# Patient Record
Sex: Female | Born: 1975 | Race: Black or African American | Hispanic: No | Marital: Single | Smoking: Current every day smoker
Health system: Southern US, Community
[De-identification: ages and names within clinical notes are randomized; demographics above are authoritative.]

## PROBLEM LIST (undated history)

## (undated) DIAGNOSIS — Z720 Tobacco use: Secondary | ICD-10-CM

## (undated) DIAGNOSIS — M25561 Pain in right knee: Secondary | ICD-10-CM

## (undated) DIAGNOSIS — R590 Localized enlarged lymph nodes: Secondary | ICD-10-CM

## (undated) DIAGNOSIS — M79641 Pain in right hand: Secondary | ICD-10-CM

## (undated) DIAGNOSIS — L03116 Cellulitis of left lower limb: Secondary | ICD-10-CM

## (undated) DIAGNOSIS — M359 Systemic involvement of connective tissue, unspecified: Secondary | ICD-10-CM

## (undated) DIAGNOSIS — M1990 Unspecified osteoarthritis, unspecified site: Secondary | ICD-10-CM

## (undated) HISTORY — PX: OTHER SURGICAL HISTORY: SHX169

---

## 2015-03-21 DIAGNOSIS — L03115 Cellulitis of right lower limb: Secondary | ICD-10-CM | POA: Diagnosis present

## 2015-03-21 DIAGNOSIS — M79641 Pain in right hand: Secondary | ICD-10-CM | POA: Insufficient documentation

## 2015-03-21 DIAGNOSIS — L03116 Cellulitis of left lower limb: Secondary | ICD-10-CM | POA: Insufficient documentation

## 2015-03-21 DIAGNOSIS — Z720 Tobacco use: Secondary | ICD-10-CM | POA: Insufficient documentation

## 2015-04-02 DIAGNOSIS — M25561 Pain in right knee: Secondary | ICD-10-CM | POA: Insufficient documentation

## 2016-01-09 ENCOUNTER — Encounter: Payer: Self-pay | Admitting: *Deleted

## 2016-01-09 ENCOUNTER — Emergency Department
Admission: EM | Admit: 2016-01-09 | Discharge: 2016-01-09 | Disposition: A | Discharge status: Home / Self Care | Payer: Self-pay | Attending: Emergency Medicine | Admitting: Emergency Medicine

## 2016-01-09 DIAGNOSIS — M19041 Primary osteoarthritis, right hand: Secondary | ICD-10-CM | POA: Insufficient documentation

## 2016-01-09 DIAGNOSIS — F17200 Nicotine dependence, unspecified, uncomplicated: Secondary | ICD-10-CM | POA: Insufficient documentation

## 2016-01-09 DIAGNOSIS — Z888 Allergy status to other drugs, medicaments and biological substances status: Secondary | ICD-10-CM | POA: Insufficient documentation

## 2016-01-09 DIAGNOSIS — M19032 Primary osteoarthritis, left wrist: Secondary | ICD-10-CM | POA: Insufficient documentation

## 2016-01-09 HISTORY — DX: Unspecified osteoarthritis, unspecified site: M19.90

## 2016-01-09 MED ORDER — NAPROXEN 500 MG PO TABS: 500.0000 mg | ORAL_TABLET | Freq: Two times a day (BID) | ORAL | Status: DC

## 2016-01-09 MED ORDER — KETOROLAC TROMETHAMINE 30 MG/ML IJ SOLN
30.0000 mg | Freq: Once | INTRAMUSCULAR | Status: AC
  Administered 2016-01-09: 30 mg via INTRAMUSCULAR
  Filled 2016-01-09: qty 1

## 2016-01-09 NOTE — ED Provider Notes (Signed)
[facility] Emergency Department Provider Note  ____________________________________________  Time seen: Approximately 10:07 AM  I have reviewed the triage vital signs and the nursing notes.   HISTORY  Chief Complaint Joint Pain    HPI Regina Carpenter is a 40 y.o. female, NAD, presents to the emergency department with 2 day history of left wrist pain, right index finger pain and bilateral foot pain. She states that she was diagnosed with osteoarthritis last year. The pain in her hand and wrist began to worsen this week and was disrupting her work. She states that she has tried tylenol and ice for the pain with no relief. Denies any injuries or traumas. Has had no open wounds, skin sores nor rash.   Past Medical History  Diagnosis Date  . Arthritis     There are no active problems to display for this patient.   History reviewed. No pertinent past surgical history.  Current Outpatient Rx  Name  Route  Sig  Dispense  Refill  . naproxen (NAPROSYN) 500 MG tablet   Oral   Take 1 tablet (500 mg total) by mouth 2 (two) times daily with a meal.   30 tablet   0     Allergies Ibuprofen  History reviewed. No pertinent family history.  Social History Social History  Substance Use Topics  . Smoking status: Current Every Day Smoker  . Smokeless tobacco: None  . Alcohol Use: None     Review of Systems Constitutional: No fever/chills, fatigue Eyes: No visual changes. Cardiovascular: No chest pain. Respiratory: No shortness of breath.  Musculoskeletal:Left wrist pain and right index finger pain and swelling. Bilateral generalized foot pain.  Skin: Swelling and redness to left wrist, right index finger and bilateral feet.  Negative for rash. Neurological: Negative for headaches, focal weakness or numbness. 10-point ROS otherwise negative.  ____________________________________________   PHYSICAL EXAM:  VITAL SIGNS: ED Triage Vitals  Enc Vitals  Group     BP 01/09/16 0906 113/78 mmHg     Pulse Rate 01/09/16 0906 99     Resp 01/09/16 0906 18     Temp 01/09/16 0906 98.1 F (36.7 C)     Temp Source 01/09/16 0906 Oral     SpO2 01/09/16 0906 100 %     Weight 01/09/16 0906 140 lb (63.504 kg)     Height 01/09/16 0906 5\' 5"  (1.651 m)     Head Cir --      Peak Flow --      Pain Score 01/09/16 0906 8     Pain Loc --      Pain Edu? --      Excl. in [place]? --      Constitutional: Alert and oriented. Well appearing and in no acute distress. Head: Atraumatic. Hematological/Lymphatic/Immunilogical: No cervical lymphadenopathy. Cardiovascular: Good peripheral circulation. Respiratory: Normal respiratory effort without tachypnea or retractions. Musculoskeletal: TTP and swelling noted at both the dorsal left wrist and MTP of the right index finger. Pain on flexion in both joints. Full range of motion of bilateral wrists, hand, fingers. Full range of motion of bilateral ankles and feet. No lower extremity tenderness nor edema.  No joint effusions. Neurologic:  Normal speech and language. No gross focal neurologic deficits are appreciated.  Skin:  Skin is warm, dry and intact. No rash noted. Psychiatric: Mood and affect are normal. Speech and behavior are normal. Patient exhibits appropriate insight and judgement.   ____________________________________________   LABS  None  ____________________________________________  EKG  None ____________________________________________  RADIOLOGY  None ____________________________________________    PROCEDURES  Procedure(s) performed: None    Medications  ketorolac (TORADOL) 30 MG/ML injection 30 mg (30 mg Intramuscular Given 01/09/16 1029)   ----------------------------------------- 10:50 AM on 01/09/2016 -----------------------------------------  Patient has tolerated Toradol IM without any side effects. Is ready for  discharge.  ____________________________________________   INITIAL IMPRESSION / ASSESSMENT AND PLAN / ED COURSE  Patient's diagnosis is consistent with osteoarthritis of the right hand and left wrist. Patient will be discharged home with prescriptions for Naproxen 500mg  to take BID as needed.  Patient verbalizes she has taken Naproxen in the past without side effects and tolerated well with adequate pain relief. Patient is to follow up with PCP if symptoms persist past this treatment course. Patient is given ED precautions to return to the ED for any worsening or new symptoms.    ____________________________________________  FINAL CLINICAL IMPRESSION(S) / ED DIAGNOSES  Final diagnoses:  Primary osteoarthritis of left wrist  Primary osteoarthritis of right hand      NEW MEDICATIONS STARTED DURING THIS VISIT:  New Prescriptions   NAPROXEN (NAPROSYN) 500 MG TABLET    Take 1 tablet (500 mg total) by mouth 2 (two) times daily with a meal.         Braxton Feathers, PA-C 01/09/16 1050  Daymon Larsen, MD 01/09/16 1134

## 2016-01-09 NOTE — Discharge Instructions (Signed)

## 2016-01-09 NOTE — ED Notes (Signed)
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

## 2016-01-09 NOTE — ED Notes (Signed)
Pt states hx of arthritis and is having pain in right hand joints and left wrist and feet, has been taking OTC meds without relief

## 2016-01-28 ENCOUNTER — Encounter: Payer: Self-pay | Admitting: *Deleted

## 2016-01-28 ENCOUNTER — Emergency Department
Admission: EM | Admit: 2016-01-28 | Discharge: 2016-01-28 | Disposition: A | Discharge status: Home / Self Care | Payer: Self-pay | Attending: Emergency Medicine | Admitting: Emergency Medicine

## 2016-01-28 DIAGNOSIS — N12 Tubulo-interstitial nephritis, not specified as acute or chronic: Secondary | ICD-10-CM | POA: Insufficient documentation

## 2016-01-28 DIAGNOSIS — D649 Anemia, unspecified: Secondary | ICD-10-CM | POA: Insufficient documentation

## 2016-01-28 DIAGNOSIS — F17210 Nicotine dependence, cigarettes, uncomplicated: Secondary | ICD-10-CM | POA: Insufficient documentation

## 2016-01-28 LAB — CBC WITH DIFFERENTIAL/PLATELET
Basophils Absolute: 0 10*3/uL (ref 0–0.1)
Basophils Relative: 0 %
EOS PCT: 2 %
Eosinophils Absolute: 0.2 10*3/uL (ref 0–0.7)
HCT: 30.1 % — ABNORMAL LOW (ref 35.0–47.0)
Hemoglobin: 9.9 g/dL — ABNORMAL LOW (ref 12.0–16.0)
LYMPHS ABS: 2.2 10*3/uL (ref 1.0–3.6)
LYMPHS PCT: 22 %
MCH: 26.4 pg (ref 26.0–34.0)
MCHC: 33 g/dL (ref 32.0–36.0)
MCV: 80.2 fL (ref 80.0–100.0)
MONO ABS: 0.6 10*3/uL (ref 0.2–0.9)
MONOS PCT: 6 %
Neutro Abs: 6.9 10*3/uL — ABNORMAL HIGH (ref 1.4–6.5)
Neutrophils Relative %: 70 %
PLATELETS: 341 10*3/uL (ref 150–440)
RBC: 3.76 MIL/uL — AB (ref 3.80–5.20)
RDW: 15 % — AB (ref 11.5–14.5)
WBC: 10 10*3/uL (ref 3.6–11.0)

## 2016-01-28 LAB — BASIC METABOLIC PANEL
Anion gap: 8 (ref 5–15)
BUN: 11 mg/dL (ref 6–20)
CO2: 25 mmol/L (ref 22–32)
Calcium: 8.7 mg/dL — ABNORMAL LOW (ref 8.9–10.3)
Chloride: 105 mmol/L (ref 101–111)
Creatinine, Ser: 0.65 mg/dL (ref 0.44–1.00)
GFR calc Af Amer: >60 mL/min (ref >60)
GFR calc non Af Amer: >60 mL/min (ref >60)
GLUCOSE: 77 mg/dL (ref 65–99)
POTASSIUM: 3.4 mmol/L — AB (ref 3.5–5.1)
Sodium: 138 mmol/L (ref 135–145)

## 2016-01-28 LAB — URINALYSIS COMPLETE WITH MICROSCOPIC (ARMC ONLY)
BILIRUBIN URINE: NEGATIVE
GLUCOSE, UA: NEGATIVE mg/dL
HGB URINE DIPSTICK: NEGATIVE
KETONES UR: NEGATIVE mg/dL
NITRITE: POSITIVE — AB
PH: 5 (ref 5.0–8.0)
Protein, ur: NEGATIVE mg/dL
SPECIFIC GRAVITY, URINE: 1.028 (ref 1.005–1.030)

## 2016-01-28 LAB — POCT PREGNANCY, URINE: Preg Test, Ur: NEGATIVE

## 2016-01-28 MED ORDER — ONDANSETRON HCL 4 MG/2ML IJ SOLN
4.0000 mg | Freq: Once | INTRAMUSCULAR | Status: AC
  Administered 2016-01-28: 4 mg via INTRAVENOUS
  Filled 2016-01-28: qty 2

## 2016-01-28 MED ORDER — FERROUS SULFATE DRIED ER 160 (50 FE) MG PO TBCR: 160.0000 mg | EXTENDED_RELEASE_TABLET | Freq: Every day | ORAL | Status: DC

## 2016-01-28 MED ORDER — SODIUM CHLORIDE 0.9 % IV SOLN
Freq: Once | INTRAVENOUS | Status: AC
  Administered 2016-01-28: 15:00:00 via INTRAVENOUS

## 2016-01-28 MED ORDER — SULFAMETHOXAZOLE-TRIMETHOPRIM 800-160 MG PO TABS: 1.0000 | ORAL_TABLET | Freq: Two times a day (BID) | ORAL | Status: DC

## 2016-01-28 MED ORDER — TRAMADOL HCL 50 MG PO TABS: 50.0000 mg | ORAL_TABLET | Freq: Four times a day (QID) | ORAL | Status: DC | PRN

## 2016-01-28 MED ORDER — DEXTROSE 5 % IV SOLN
1.0000 g | Freq: Once | INTRAVENOUS | Status: AC
  Administered 2016-01-28: 1 g via INTRAVENOUS
  Filled 2016-01-28 (×2): qty 10

## 2016-01-28 MED ORDER — MORPHINE SULFATE (PF) 4 MG/ML IV SOLN
4.0000 mg | Freq: Once | INTRAVENOUS | Status: AC
Start: 2016-01-28 — End: 2016-01-28
  Administered 2016-01-28: 4 mg via INTRAVENOUS
  Filled 2016-01-28: qty 1

## 2016-01-28 NOTE — ED Notes (Signed)
Patient given info on [facility], [facility] and [facility].  Patient verbalized understanding of need to follow-up.

## 2016-01-28 NOTE — ED Provider Notes (Signed)
[facility] Emergency Department Provider Note        Time seen: ----------------------------------------- 2:45 PM on 01/28/2016 -----------------------------------------    I have reviewed the triage vital signs and the nursing notes.   HISTORY  Chief Complaint Flank Pain    HPI Regina Carpenter is a 40 y.o. female who presents to ER for bilateral flank pain and frequent urination for several days. Patient states she has had a history of kidney infections but last had one years ago. She states she's had fevers chills and has been nauseated with frequent urination but not dysuria. She denies any other complaints at this time.   Past Medical History  Diagnosis Date  . Arthritis     There are no active problems to display for this patient.   History reviewed. No pertinent past surgical history.  Allergies Ibuprofen  Social History Social History  Substance Use Topics  . Smoking status: Current Every Day Smoker -- 1.00 packs/day    Types: Cigarettes  . Smokeless tobacco: None  . Alcohol Use: None   Review of Systems Constitutional: Negative for fever. Eyes: Negative for visual changes. ENT: Negative for sore throat. Cardiovascular: Negative for chest pain. Respiratory: Negative for shortness of breath. Gastrointestinal: Negative for abdominal pain, vomiting and diarrhea. Genitourinary: Positive for polyuria Musculoskeletal: Positive for back pain Skin: Negative for rash. Neurological: Negative for headaches, focal weakness or numbness.  10-point ROS otherwise negative.  ____________________________________________   PHYSICAL EXAM:  VITAL SIGNS: ED Triage Vitals  Enc Vitals Group     BP 01/28/16 1314 115/69 mmHg     Pulse Rate 01/28/16 1314 93     Resp 01/28/16 1314 20     Temp 01/28/16 1314 98 F (36.7 C)     Temp Source 01/28/16 1314 Oral     SpO2 01/28/16 1314 98 %     Weight 01/28/16 1314 140 lb (63.504 kg)     Height  01/28/16 1314 5\' 5"  (1.651 m)     Head Cir --      Peak Flow --      Pain Score 01/28/16 1315 8     Pain Loc --      Pain Edu? --      Excl. in [place]? --    Constitutional: Alert and oriented. Mild distress Eyes: Conjunctivae are normal. PERRL. Normal extraocular movements. ENT   Head: Normocephalic and atraumatic.   Nose: No congestion/rhinnorhea.   Mouth/Throat: Mucous membranes are moist.   Neck: No stridor. Cardiovascular: Normal rate, regular rhythm. No murmurs, rubs, or gallops. Respiratory: Normal respiratory effort without tachypnea nor retractions. Breath sounds are clear and equal bilaterally. No wheezes/rales/rhonchi. Gastrointestinal: Soft and nontender. Normal bowel sounds. Bilateral CVA tenderness Musculoskeletal: Nontender with normal range of motion in all extremities. No lower extremity tenderness nor edema. Neurologic:  Normal speech and language. No gross focal neurologic deficits are appreciated.  Skin:  Skin is warm, dry and intact. No rash noted. Psychiatric: Mood and affect are normal. Speech and behavior are normal.  ____________________________________________  ED COURSE:  Pertinent labs & imaging results that were available during my care of the patient were reviewed by me and considered in my medical decision making (see chart for details).  Patient presents with symptoms similar to pyelonephritis. I will check basic labs, urine, give IV fluid, antiemetics and pain medicine. ____________________________________________    LABS (pertinent positives/negatives)  Labs Reviewed  URINALYSIS COMPLETEWITH MICROSCOPIC ([place]) - Abnormal; Notable for the following:  Color, Urine YELLOW (*)    APPearance HAZY (*)    Nitrite POSITIVE (*)    Leukocytes, UA 2+ (*)    Bacteria, UA MANY (*)    Squamous Epithelial / LPF 0-5 (*)    All other components within normal limits  CBC WITH DIFFERENTIAL/PLATELET - Abnormal; Notable for the following:    RBC  3.76 (*)    Hemoglobin 9.9 (*)    HCT 30.1 (*)    RDW 15.0 (*)    Neutro Abs 6.9 (*)    All other components within normal limits  BASIC METABOLIC PANEL - Abnormal; Notable for the following:    Potassium 3.4 (*)    Calcium 8.7 (*)    All other components within normal limits  POC URINE PREG, ED  POCT PREGNANCY, URINE     ____________________________________________  FINAL ASSESSMENT AND PLAN   Pyelonephritis, anemia  Plan: Patient with labs as dictated above. Patient clinically with pyelonephritis and she has received IV Rocephin. A urine culture has been sent and she will go home with Septra DS. I will also start her on iron for what is likely iron deficiency anemia. She is stable for outpatient follow-up.   Earleen Newport, MD   Note: This dictation was prepared with Dragon dictation. Any transcriptional errors that result from this process are unintentional   Earleen Newport, MD 01/28/16 [phone]

## 2016-01-28 NOTE — ED Notes (Signed)
Pt complains of bilateral flank pain with frequent urination

## 2016-01-28 NOTE — Discharge Instructions (Signed)
Anemia, Nonspecific Anemia is a condition in which the concentration of red blood cells or hemoglobin in the blood is below normal. Hemoglobin is a substance in red blood cells that carries oxygen to the tissues of the body. Anemia results in not enough oxygen reaching these tissues.  CAUSES  Common causes of anemia include:   Excessive bleeding. Bleeding may be internal or external. This includes excessive bleeding from periods (in women) or from the intestine.   Poor nutrition.   Chronic kidney, thyroid, and liver disease.  Bone marrow disorders that decrease red blood cell production.  Cancer and treatments for cancer.  HIV, AIDS, and their treatments.  Spleen problems that increase red blood cell destruction.  Blood disorders.  Excess destruction of red blood cells due to infection, medicines, and autoimmune disorders. SIGNS AND SYMPTOMS   Minor weakness.   Dizziness.   Headache.  Palpitations.   Shortness of breath, especially with exercise.   Paleness.  Cold sensitivity.  Indigestion.  Nausea.  Difficulty sleeping.  Difficulty concentrating. Symptoms may occur suddenly or they may develop slowly.  DIAGNOSIS  Additional blood tests are often needed. These help your health care provider determine the best treatment. Your health care provider will check your stool for blood and look for other causes of blood loss.  TREATMENT  Treatment varies depending on the cause of the anemia. Treatment can include:   Supplements of iron, vitamin 123456, or folic acid.   Hormone medicines.   A blood transfusion. This may be needed if blood loss is severe.   Hospitalization. This may be needed if there is significant continual blood loss.   Dietary changes.  Spleen removal. HOME CARE INSTRUCTIONS Keep all follow-up appointments. It often takes many weeks to correct anemia, and having your health care provider check on your condition and your response to  treatment is very important. SEEK IMMEDIATE MEDICAL CARE IF:   You develop extreme weakness, shortness of breath, or chest pain.   You become dizzy or have trouble concentrating.  You develop heavy vaginal bleeding.   You develop a rash.   You have bloody or black, tarry stools.   You faint.   You vomit up blood.   You vomit repeatedly.   You have abdominal pain.  You have a fever or persistent symptoms for more than 2-3 days.   You have a fever and your symptoms suddenly get worse.   You are dehydrated.  MAKE SURE YOU:  Understand these instructions.  Will watch your condition.  Will get help right away if you are not doing well or get worse.   This information is not intended to replace advice given to you by your health care provider. Make sure you discuss any questions you have with your health care provider.   Document Released: 10/28/2004 Document Revised: 05/23/2013 Document Reviewed: 03/16/2013 Elsevier Interactive Patient Education 2016 Elsevier Inc[employer]  Pyelonephritis, Adult Pyelonephritis is a kidney infection. The kidneys are the organs that filter a person's blood and move waste out of the bloodstream and into the urine. Urine passes from the kidneys, through the ureters, and into the bladder. There are two main types of pyelonephritis:  Infections that come on quickly without any warning (acute pyelonephritis).  Infections that last for a long period of time (chronic pyelonephritis). In most cases, the infection clears up with treatment and does not cause further problems. More severe infections or chronic infections can sometimes spread to the bloodstream or lead to other problems with  the kidneys. CAUSES This condition is usually caused by:  Bacteria traveling from the bladder to the kidney through infected urine. The urine in the bladder can become infected with bacteria from:  Bladder infection (cystitis).  Inflammation of the prostate  gland (prostatitis).  Sexual intercourse, in females.  Bacteria traveling from the bloodstream to the kidney. RISK FACTORS This condition is more likely to develop in:  Pregnant women.  Older people.  People who have diabetes.  People who have kidney stones or bladder stones.  People who have other abnormalities of the kidney or ureter.  People who have a catheter placed in the bladder.  People who have cancer.  People who are sexually active.  Women who use spermicides.  People who have had a prior urinary tract infection. SYMPTOMS Symptoms of this condition include:  Frequent urination.  Strong or persistent urge to urinate.  Burning or stinging when urinating.  Abdominal pain.  Back pain.  Pain in the side or flank area.  Fever.  Chills.  Blood in the urine, or dark urine.  Nausea.  Vomiting. DIAGNOSIS This condition may be diagnosed based on:  Medical history and physical exam.  Urine tests.  Blood tests. You may also have imaging tests of the kidneys, such as an ultrasound or CT scan. TREATMENT Treatment for this condition may depend on the severity of the infection.  If the infection is mild and is found early, you may be treated with antibiotic medicines taken by mouth. You will need to drink fluids to remain hydrated.  If the infection is more severe, you may need to stay in the hospital and receive antibiotics given directly into a vein through an IV tube. You may also need to receive fluids through an IV tube if you are not able to remain hydrated. After your hospital stay, you may need to take oral antibiotics for a period of time. Other treatments may be required, depending on the cause of the infection. HOME CARE INSTRUCTIONS Medicines  Take over-the-counter and prescription medicines only as told by your health care provider.  If you were prescribed an antibiotic medicine, take it as told by your health care provider. Do not stop  taking the antibiotic even if you start to feel better. General Instructions  Drink enough fluid to keep your urine clear or pale yellow.  Avoid caffeine, tea, and carbonated beverages. They tend to irritate the bladder.  Urinate often. Avoid holding in urine for long periods of time.  Urinate before and after sex.  After a bowel movement, women should cleanse from front to back. Use each tissue only once.  Keep all follow-up visits as told by your health care provider. This is important. SEEK MEDICAL CARE IF:  Your symptoms do not get better after 2 days of treatment.  Your symptoms get worse.  You have a fever. SEEK IMMEDIATE MEDICAL CARE IF:  You are unable to take your antibiotics or fluids.  You have shaking chills.  You vomit.  You have severe flank or back pain.  You have extreme weakness or fainting.   This information is not intended to replace advice given to you by your health care provider. Make sure you discuss any questions you have with your health care provider.   Document Released: 09/20/2005 Document Revised: 06/11/2015 Document Reviewed: 01/13/2015 Elsevier Interactive Patient Education [employer].

## 2016-02-01 ENCOUNTER — Emergency Department: Payer: Self-pay

## 2016-02-01 ENCOUNTER — Encounter: Payer: Self-pay | Admitting: Emergency Medicine

## 2016-02-01 ENCOUNTER — Emergency Department
Admission: EM | Admit: 2016-02-01 | Discharge: 2016-02-01 | Disposition: A | Discharge status: Home / Self Care | Payer: Self-pay | Attending: Emergency Medicine | Admitting: Emergency Medicine

## 2016-02-01 DIAGNOSIS — R0789 Other chest pain: Secondary | ICD-10-CM | POA: Insufficient documentation

## 2016-02-01 DIAGNOSIS — F17210 Nicotine dependence, cigarettes, uncomplicated: Secondary | ICD-10-CM | POA: Insufficient documentation

## 2016-02-01 MED ORDER — KETOROLAC TROMETHAMINE 60 MG/2ML IM SOLN: 30.0000 mg | Freq: Once | INTRAMUSCULAR | Status: DC

## 2016-02-01 MED ORDER — KETOROLAC TROMETHAMINE 30 MG/ML IJ SOLN
INTRAMUSCULAR | Status: AC
  Administered 2016-02-01: 30 mg
  Filled 2016-02-01: qty 1

## 2016-02-01 MED ORDER — KETOROLAC TROMETHAMINE 10 MG PO TABS: 10.0000 mg | ORAL_TABLET | Freq: Three times a day (TID) | ORAL | Status: DC

## 2016-02-01 MED ORDER — CYCLOBENZAPRINE HCL 5 MG PO TABS
5.0000 mg | ORAL_TABLET | Freq: Three times a day (TID) | ORAL | Status: DC | PRN
Start: 2016-02-01 — End: 2016-02-16

## 2016-02-01 NOTE — ED Notes (Signed)
Patient presents to the Ed complaining of right rib pain that started 1 week ago, patient states that the pain radiates to her right arm. Patient denies N/V, shortness of breath, lightheadedness, or radiation into the back. Patient was ambulatory to the room, appears NAD at this time

## 2016-02-01 NOTE — Discharge Instructions (Signed)
Chest Wall Pain Chest wall pain is pain in or around the bones and muscles of your chest. Sometimes, an injury causes this pain. Sometimes, the cause may not be known. This pain may take several weeks or longer to get better. HOME CARE INSTRUCTIONS  Pay attention to any changes in your symptoms. Take these actions to help with your pain:   Rest as told by your health care provider.   Avoid activities that cause pain. These include any activities that use your chest muscles or your abdominal and side muscles to lift heavy items.   If directed, apply ice to the painful area:  Put ice in a plastic bag.  Place a towel between your skin and the bag.  Leave the ice on for 20 minutes, 2-3 times per day.  Take over-the-counter and prescription medicines only as told by your health care provider.  Do not use tobacco products, including cigarettes, chewing tobacco, and e-cigarettes. If you need help quitting, ask your health care provider.  Keep all follow-up visits as told by your health care provider. This is important. SEEK MEDICAL CARE IF:  You have a fever.  Your chest pain becomes worse.  You have new symptoms. SEEK IMMEDIATE MEDICAL CARE IF:  You have nausea or vomiting.  You feel sweaty or light-headed.  You have a cough with phlegm (sputum) or you cough up blood.  You develop shortness of breath.   This information is not intended to replace advice given to you by your health care provider. Make sure you discuss any questions you have with your health care provider.   Document Released: 09/20/2005 Document Revised: 06/11/2015 Document Reviewed: 12/16/2014 Elsevier Interactive Patient Education [employer].   Your exam and x-ray are normal today. Continue to dose your previous pain medicine, along with the newly prescribed Cyclobenzaprine (muscle relaxant) and Ketorolac (anti-inflammatory). You will stop the Naproxen for the next 5 days, while taking the  Ketorolac. Apply moist heat compresses to the chest wall for discomfort. Follow-up with [facility] for recheck as needed.

## 2016-02-01 NOTE — ED Provider Notes (Signed)
[facility] Emergency Department Provider Note ____________________________________________  Time seen: 0845  I have reviewed the triage vital signs and the nursing notes.  HISTORY  Chief Complaint  Chest Pain  HPI Regina Carpenter is a 40 y.o. female presents to the ED for evaluation of right rib and chest wall pain that started a week ago. She describes the pain seemed to started her mid chest and radiated under her right breast from the sternum. She denies any referral into the neck or upper extremity. Reports that the discomfort in her right chest is aggravated by movement of the right upper extremity as well as deep breaths. She denies any shortness of breath, wheezing, syncope. She describes the pains got worse over the past 3 days. She denies any interim trauma, accident, or injury. She also denies any skin rash or changes. She was evaluated here last week and treated for a urinary tract infection, but did not have any significant complaints related to the then ongoing chest wall pain. She has been dosing tramadol in the interim for her bladder infection but denies any benefit to her chest. Denies any urinary symptoms at this time and iscontinuing her regimen of Bactrim. She rates her pain at a 10/10 in triage at the right rib cage.  Past Medical History  Diagnosis Date  . Arthritis     There are no active problems to display for this patient.   History reviewed. No pertinent past surgical history.  Current Outpatient Rx  Name  Route  Sig  Dispense  Refill  . cyclobenzaprine (FLEXERIL) 5 MG tablet   Oral   Take 1 tablet (5 mg total) by mouth every 8 (eight) hours as needed for muscle spasms.   12 tablet   0   . ferrous sulfate (EQL SLOW RELEASE IRON) 160 (50 Fe) MG TBCR SR tablet   Oral   Take 1 tablet (160 mg total) by mouth daily.   30 each   6   . ketorolac (TORADOL) 10 MG tablet   Oral   Take 1 tablet (10 mg total) by mouth every 8 (eight)  hours.   15 tablet   0   . naproxen (NAPROSYN) 500 MG tablet   Oral   Take 1 tablet (500 mg total) by mouth 2 (two) times daily with a meal.   30 tablet   0   . sulfamethoxazole-trimethoprim (BACTRIM DS) 800-160 MG tablet   Oral   Take 1 tablet by mouth 2 (two) times daily.   20 tablet   0   . traMADol (ULTRAM) 50 MG tablet   Oral   Take 1 tablet (50 mg total) by mouth every 6 (six) hours as needed.   20 tablet   0     Allergies Ibuprofen  No family history on file.  Social History Social History  Substance Use Topics  . Smoking status: Current Every Day Smoker -- 1.00 packs/day    Types: Cigarettes  . Smokeless tobacco: None  . Alcohol Use: No    Review of Systems  Constitutional: Negative for fever. Cardiovascular: Negative for chest pain. Respiratory: Negative for shortness of breath. Gastrointestinal: Negative for abdominal pain, vomiting and diarrhea. Genitourinary: Negative for dysuria. Musculoskeletal: Negative for back pain. Chest wall pain as above Skin: Negative for rash. Neurological: Negative for headaches, focal weakness or numbness. ____________________________________________  PHYSICAL EXAM:  VITAL SIGNS: ED Triage Vitals  Enc Vitals Group     BP 02/01/16 0814 99/61 mmHg  Pulse Rate 02/01/16 0814 85     Resp 02/01/16 0814 16     Temp 02/01/16 0814 99.2 F (37.3 C)     Temp Source 02/01/16 0814 Oral     SpO2 02/01/16 0814 97 %     Weight 02/01/16 0814 140 lb (63.504 kg)     Height 02/01/16 0814 5\' 5"  (1.651 m)     Head Cir --      Peak Flow --      Pain Score 02/01/16 0816 10     Pain Loc --      Pain Edu? --      Excl. in [place]? --    Constitutional: Alert and oriented. Well appearing and in no distress. Head: Normocephalic and atraumatic.      Eyes: Conjunctivae are normal. PERRL. Normal extraocular movements      Ears: Canals clear. TMs intact bilaterally.   Nose: No congestion/rhinorrhea.   Mouth/Throat: Mucous  membranes are moist.   Neck: Supple. No thyromegaly. Hematological/Lymphatic/Immunological: No cervical, Supra- or infra clavicular lymphadenopathy. Cardiovascular: Normal rate, regular rhythm.  Respiratory: Normal respiratory effort. No wheezes/rales/rhonchi. Gastrointestinal: Soft and nontender. No distention, rebound, guarding, or rigidity. Normal bowel sounds. No CVA tenderness. Musculoskeletal: Chest wall without obvious deformity, ecchymosis, bruising, or injury. Nontender with normal range of motion in all extremities. Patient with minimal tenderness to palpation to the inferior portion of the right breast over the chest wall. There is no appreciable deformity or swelling at that region. Anterior right chest wall pain is reproducible with active movement of the right upper extremity. The pain does not referral around the chest or radiating to the neck. Neurologic:  Normal gait without ataxia. Normal speech and language. No gross focal neurologic deficits are appreciated. Skin:  Skin is warm, dry and intact. No rash noted. ____________________________________________   RADIOLOGY  Right Rib Detail IMPRESSION: Negative. ____________________________________________  PROCEDURES  Toradol 30 mg IM ____________________________________________  INITIAL IMPRESSION / ASSESSMENT AND PLAN / ED COURSE  Patient with musculoskeletal pain to the anterior chest this does not appear to have a cardiopulmonary etiology. The pain is reproducible with palpation and active range motion of the right upper extremities. Patient with improvement following injection with IM Toradol. She will be discharged with a prescription for Toradol 2 doses in lieu of her Naprosyn for the next 5 days. She is also provided with a prescription for Flexeril to dose as needed for interim muscle skeletal spasm. She will continue with her previous he prescribed antibiotic and Ultram for pain. She should follow with her primary  care provider at [facility] taking any clinic for ongoing symptom management. Return to the ED as needed. ____________________________________________  FINAL CLINICAL IMPRESSION(S) / ED DIAGNOSES  Final diagnoses:  Acute chest wall pain  Anterior chest wall pain     Melvenia Needles, PA-C 02/01/16 1229  Harvest Dark, MD 02/01/16 1506

## 2016-02-01 NOTE — ED Notes (Addendum)
Patient c/o right sided chest pain under her right breast that started 1 week ago but has gotten worse over the past 3 days. Patient states that the pain radiates into her right arm. Pt denies any other symptoms, no known injury to the area. Patient states that she has had numbness and tingling in her right arm since yesterday

## 2016-02-16 ENCOUNTER — Emergency Department
Admission: EM | Admit: 2016-02-16 | Discharge: 2016-02-16 | Disposition: A | Discharge status: Home / Self Care | Payer: Self-pay | Attending: Emergency Medicine | Admitting: Emergency Medicine

## 2016-02-16 ENCOUNTER — Encounter: Payer: Self-pay | Admitting: Emergency Medicine

## 2016-02-16 DIAGNOSIS — M1990 Unspecified osteoarthritis, unspecified site: Secondary | ICD-10-CM | POA: Insufficient documentation

## 2016-02-16 DIAGNOSIS — F17210 Nicotine dependence, cigarettes, uncomplicated: Secondary | ICD-10-CM | POA: Insufficient documentation

## 2016-02-16 DIAGNOSIS — S0501XA Injury of conjunctiva and corneal abrasion without foreign body, right eye, initial encounter: Secondary | ICD-10-CM | POA: Insufficient documentation

## 2016-02-16 DIAGNOSIS — Y999 Unspecified external cause status: Secondary | ICD-10-CM | POA: Insufficient documentation

## 2016-02-16 DIAGNOSIS — X58XXXA Exposure to other specified factors, initial encounter: Secondary | ICD-10-CM | POA: Insufficient documentation

## 2016-02-16 DIAGNOSIS — Y929 Unspecified place or not applicable: Secondary | ICD-10-CM | POA: Insufficient documentation

## 2016-02-16 DIAGNOSIS — Y939 Activity, unspecified: Secondary | ICD-10-CM | POA: Insufficient documentation

## 2016-02-16 MED ORDER — TETRACAINE HCL 0.5 % OP SOLN
1.0000 [drp] | Freq: Once | OPHTHALMIC | Status: DC
  Filled 2016-02-16: qty 2

## 2016-02-16 MED ORDER — FLUORESCEIN SODIUM 1 MG OP STRP
1.0000 | ORAL_STRIP | Freq: Once | OPHTHALMIC | Status: DC
  Filled 2016-02-16: qty 1

## 2016-02-16 MED ORDER — TOBRAMYCIN 0.3 % OP SOLN: 2.0000 [drp] | OPHTHALMIC | Status: DC

## 2016-02-16 MED ORDER — EYE WASH OPHTH SOLN
1.0000 [drp] | OPHTHALMIC | Status: DC | PRN
  Filled 2016-02-16: qty 118

## 2016-02-16 NOTE — ED Notes (Signed)
States she was sitting outside on Thursday  Then noticed right eye red with some swelling and pain on friday

## 2016-02-16 NOTE — ED Notes (Signed)
Says right eye drainage since Thursday or Friday.  Says it aches and it just runs

## 2016-02-16 NOTE — ED Provider Notes (Signed)
[facility] Emergency Department Provider Note   ____________________________________________  Time seen: Approximately 7:40 AM  I have reviewed the triage vital signs and the nursing notes.   HISTORY  Chief Complaint Eye Drainage   HPI Regina Carpenter is a 40 y.o. female is here complaining of right thigh drainage. Patient states that she feels like there is something in her eye. She states that she did some cleaning on Friday and afterwards began having the sensation of foreign body. Patient states there is been clear drainage and she is light sensitive. Patient denies any changes in her vision. She denies any matting or yellow discharge.Currently she rates her pain as a 9/10.   Past Medical History  Diagnosis Date  . Arthritis     There are no active problems to display for this patient.   History reviewed. No pertinent past surgical history.  Current Outpatient Rx  Name  Route  Sig  Dispense  Refill  . ferrous sulfate (EQL SLOW RELEASE IRON) 160 (50 Fe) MG TBCR SR tablet   Oral   Take 1 tablet (160 mg total) by mouth daily.   30 each   6   . tobramycin (TOBREX) 0.3 % ophthalmic solution   Right Eye   Place 2 drops into the right eye every 4 (four) hours. While awake   5 mL   0     Allergies Ibuprofen  No family history on file.  Social History Social History  Substance Use Topics  . Smoking status: Current Every Day Smoker -- 1.00 packs/day    Types: Cigarettes  . Smokeless tobacco: None  . Alcohol Use: No    Review of Systems Constitutional: No fever/chills Eyes: No visual changes.Positive photophobia. Positive right eye pain. ENT: No sore throat. Cardiovascular: Denies chest pain. Respiratory: Denies shortness of breath. Gastrointestinal:   No nausea, no vomiting.  Skin: Negative for rash. Neurological: Negative for headaches, focal weakness or numbness.  10-point ROS otherwise  negative.  ____________________________________________   PHYSICAL EXAM:  VITAL SIGNS: ED Triage Vitals  Enc Vitals Group     BP --      Pulse --      Resp --      Temp --      Temp src --      SpO2 --      Weight --      Height --      Head Cir --      Peak Flow --      Pain Score 02/16/16 0732 9     Pain Loc --      Pain Edu? --      Excl. in [place]? --     Constitutional: Alert and oriented. Well appearing and in no acute distress. Eyes: Right conjunctiva slightly injected. Clear watery tears from right eye. No purulent drainage seen. PERRL. EOMI. Head: Atraumatic. Nose: No congestion/rhinnorhea. Neck: No stridor.   Cardiovascular: Normal rate, regular rhythm. Grossly normal heart sounds.  Good peripheral circulation. Respiratory: Normal respiratory effort.  No retractions. Lungs CTAB. Musculoskeletal: No lower extremity tenderness nor edema.  No joint effusions. Neurologic:  Normal speech and language. No gross focal neurologic deficits are appreciated. No gait instability. Skin:  Skin is warm, dry and intact. No rash noted. Psychiatric: Mood and affect are normal. Speech and behavior are normal.  ____________________________________________   LABS (all labs ordered are listed, but only abnormal results are displayed)  Labs Reviewed - No data to display  PROCEDURES  Procedure(s) performed: Tetracaine ophthalmic was applied to the right eye. Eyelid was inverted with out foreign body seen. Flurosceine dye was applied. There is a superficial abrasion at approximately 6:00 on the cornea. No foreign body is noted. Eye was irrigated with eyewash.  Critical Care performed: No  ____________________________________________   INITIAL IMPRESSION / ASSESSMENT AND PLAN / ED COURSE  Pertinent labs & imaging results that were available during my care of the patient were reviewed by me and considered in my medical decision making (see chart for details).  Patient is  follow-up with [facility] if any continued problems in one to 2 days. She was given a prescription for Tobrex ophthalmic solution. She is also to wear sunglasses in bright light due to her sensitivity. ____________________________________________   FINAL CLINICAL IMPRESSION(S) / ED DIAGNOSES  Final diagnoses:  Corneal abrasion, right, initial encounter      NEW MEDICATIONS STARTED DURING THIS VISIT:  Discharge Medication List as of 02/16/2016  8:13 AM    START taking these medications   Details  tobramycin (TOBREX) 0.3 % ophthalmic solution Place 2 drops into the right eye every 4 (four) hours. While awake, Starting 02/16/2016, Until Discontinued, Print         Note:  This document was prepared using Dragon voice recognition software and may include unintentional dictation errors.    Johnn Hai, PA-C 02/16/16 1558  Lisa Roca, MD 02/17/16 1037

## 2016-02-16 NOTE — Discharge Instructions (Signed)
Corneal Abrasion The cornea is the clear covering at the front and center of the eye. When you look at the colored portion of the eye, you are looking through the cornea. It is a thin tissue made up of layers. The top layer is the most sensitive layer. A corneal abrasion happens if this layer is scratched or an injury causes it to come off.  HOME CARE  You may be given drops or a medicated cream. Use the medicine as told by your doctor.  A pressure patch may be put over the eye. If this is done, follow your doctor's instructions for when to remove the patch. Do not drive or use machines while the eye patch is on. Judging distances is hard to do with a patch on.  See your doctor for a follow-up exam if you are told to do so. It is very important that you keep this appointment. GET HELP IF:   You have pain, are sensitive to light, and have a scratchy feeling in one eye or both eyes.  Your pressure patch keeps getting loose. You can blink your eye under the patch.  You have fluid coming from your eye or the lids stick together in the morning.  You have the same symptoms in the morning that you did with the first abrasion. This could be days, weeks, or months after the first abrasion healed.   This information is not intended to replace advice given to you by your health care provider. Make sure you discuss any questions you have with your health care provider.   Document Released: 03/08/2008 Document Revised: 06/11/2015 Document Reviewed: 05/28/2013 Elsevier Interactive Patient Education 2016 [place] using eyedrops today every 4 hours while awake. Call [facility] if any continued problems in one to 2 days to be seen by one of the ophthalmologist. Left them know that you were seen in the emergency room. You may take Tylenol as needed for pain. Wear sunglasses when out in bright light.

## 2016-08-03 ENCOUNTER — Encounter: Payer: Self-pay | Admitting: Emergency Medicine

## 2016-08-03 ENCOUNTER — Emergency Department
Admission: EM | Admit: 2016-08-03 | Discharge: 2016-08-03 | Disposition: A | Discharge status: Home / Self Care | Payer: Self-pay | Attending: Emergency Medicine | Admitting: Emergency Medicine

## 2016-08-03 DIAGNOSIS — Y9389 Activity, other specified: Secondary | ICD-10-CM | POA: Insufficient documentation

## 2016-08-03 DIAGNOSIS — S29019A Strain of muscle and tendon of unspecified wall of thorax, initial encounter: Secondary | ICD-10-CM

## 2016-08-03 DIAGNOSIS — S29012A Strain of muscle and tendon of back wall of thorax, initial encounter: Secondary | ICD-10-CM | POA: Insufficient documentation

## 2016-08-03 DIAGNOSIS — F17210 Nicotine dependence, cigarettes, uncomplicated: Secondary | ICD-10-CM | POA: Insufficient documentation

## 2016-08-03 DIAGNOSIS — Y929 Unspecified place or not applicable: Secondary | ICD-10-CM | POA: Insufficient documentation

## 2016-08-03 DIAGNOSIS — Y990 Civilian activity done for income or pay: Secondary | ICD-10-CM | POA: Insufficient documentation

## 2016-08-03 DIAGNOSIS — X503XXA Overexertion from repetitive movements, initial encounter: Secondary | ICD-10-CM | POA: Insufficient documentation

## 2016-08-03 DIAGNOSIS — Z79899 Other long term (current) drug therapy: Secondary | ICD-10-CM | POA: Insufficient documentation

## 2016-08-03 MED ORDER — CYCLOBENZAPRINE HCL 10 MG PO TABS: 10.0000 mg | ORAL_TABLET | Freq: Three times a day (TID) | ORAL | 0 refills | Status: DC | PRN

## 2016-08-03 MED ORDER — CYCLOBENZAPRINE HCL 10 MG PO TABS
10.0000 mg | ORAL_TABLET | Freq: Once | ORAL | Status: AC
  Administered 2016-08-03: 10 mg via ORAL
  Filled 2016-08-03: qty 1

## 2016-08-03 MED ORDER — TRAMADOL HCL 50 MG PO TABS
50.0000 mg | ORAL_TABLET | Freq: Four times a day (QID) | ORAL | 0 refills | Status: DC | PRN
Start: 2016-08-03 — End: 2016-11-15

## 2016-08-03 MED ORDER — TRAMADOL HCL 50 MG PO TABS
50.0000 mg | ORAL_TABLET | Freq: Once | ORAL | Status: AC
  Administered 2016-08-03: 50 mg via ORAL
  Filled 2016-08-03: qty 1

## 2016-08-03 NOTE — ED Provider Notes (Signed)
[facility] Emergency Department Provider Note   ____________________________________________   First MD Initiated Contact with Patient 08/03/16 1925     (approximate)  I have reviewed the triage vital signs and the nursing notes.   HISTORY  Chief Complaint Back Pain    HPI Regina Carpenter is a 40 y.o. female patient complaining of 2 days of back pain. Patient stated no positive without incident. Patient has job requires repetitive pulling and overhead reaching. Patient denies any loss sensation or loss of function of the upper extremities. Patient stated pain increases with overhead reaching.Patient is rating her pain as a 7/10. Patient described a pain as "achy". Except for over-the-counter anti-inflammatory medication no palliative measures for this complaint.   Past Medical History:  Diagnosis Date  . Arthritis     There are no active problems to display for this patient.   History reviewed. No pertinent surgical history.  Prior to Admission medications   Medication Sig Start Date End Date Taking? Authorizing Provider  cyclobenzaprine (FLEXERIL) 10 MG tablet Take 1 tablet (10 mg total) by mouth 3 (three) times daily as needed. 08/03/16   Sable Feil, PA-C  ferrous sulfate (EQL SLOW RELEASE IRON) 160 (50 Fe) MG TBCR SR tablet Take 1 tablet (160 mg total) by mouth daily. 01/28/16   Earleen Newport, MD  tobramycin (TOBREX) 0.3 % ophthalmic solution Place 2 drops into the right eye every 4 (four) hours. While awake 02/16/16   Johnn Hai, PA-C  traMADol (ULTRAM) 50 MG tablet Take 1 tablet (50 mg total) by mouth every 6 (six) hours as needed. 08/03/16 08/03/17  Sable Feil, PA-C    Allergies Ibuprofen  No family history on file.  Social History Social History  Substance Use Topics  . Smoking status: Current Every Day Smoker    Packs/day: 1.00    Types: Cigarettes  . Smokeless tobacco: Never Used  . Alcohol use No    Review  of Systems Constitutional: No fever/chills Eyes: No visual changes. ENT: No sore throat. Cardiovascular: Denies chest pain. Respiratory: Denies shortness of breath. Gastrointestinal: No abdominal pain.  No nausea, no vomiting.  No diarrhea.  No constipation. Genitourinary: Negative for dysuria. Musculoskeletal: Positive upper back pain Skin: Negative for rash. Neurological: Negative for headaches, focal weakness or numbness.    ____________________________________________   PHYSICAL EXAM:  VITAL SIGNS: ED Triage Vitals  Enc Vitals Group     BP 08/03/16 1848 115/77     Pulse Rate 08/03/16 1848 75     Resp 08/03/16 1848 16     Temp 08/03/16 1848 98.1 F (36.7 C)     Temp Source 08/03/16 1848 Oral     SpO2 08/03/16 1848 100 %     Weight 08/03/16 1849 174 lb (78.9 kg)     Height 08/03/16 1849 5\' 5"  (1.651 m)     Head Circumference --      Peak Flow --      Pain Score 08/03/16 1925 7     Pain Loc --      Pain Edu? --      Excl. in [place]? --     Constitutional: Alert and oriented. Well appearing and in no acute distress. Eyes: Conjunctivae are normal. PERRL. EOMI. Head: Atraumatic. Nose: No congestion/rhinnorhea. Mouth/Throat: Mucous membranes are moist.  Oropharynx non-erythematous. Neck: No stridor.  No cervical spine tenderness to palpation. Hematological/Lymphatic/Immunilogical: No cervical lymphadenopathy. Cardiovascular: Normal rate, regular rhythm. Grossly normal heart sounds.  Good peripheral  circulation. Respiratory: Normal respiratory effort.  No retractions. Lungs CTAB. Gastrointestinal: Soft and nontender. No distention. No abdominal bruits. No CVA tenderness. Musculoskeletal: No lower extremity tenderness nor edema.  No joint effusions. Neurologic:  Normal speech and language. No gross focal neurologic deficits are appreciated. No gait instability. Skin:  Skin is warm, dry and intact. No rash noted. Psychiatric: Mood and affect are normal. Speech and behavior  are normal.  ____________________________________________   LABS (all labs ordered are listed, but only abnormal results are displayed)  Labs Reviewed - No data to display ____________________________________________  EKG  EKG was read by our [facility] Dr. revealed a normal sinus rhythm ____________________________________________  RADIOLOGY   ____________________________________________   PROCEDURES  Procedure(s) performed: None  Procedures  Critical Care performed: No  ____________________________________________   INITIAL IMPRESSION / ASSESSMENT AND PLAN / ED COURSE  Pertinent labs & imaging results that were available during my care of the patient were reviewed by me and considered in my medical decision making (see chart for details).  Thoracic strain. Patient given discharge care instructions. Patient given prescription for tramadol, Flexeril, and ibuprofen. Patient advised to follow-up with "clinic if condition persists.  Clinical Course     ____________________________________________   FINAL CLINICAL IMPRESSION(S) / ED DIAGNOSES  Final diagnoses:  Thoracic myofascial strain, initial encounter      NEW MEDICATIONS STARTED DURING THIS VISIT:  New Prescriptions   CYCLOBENZAPRINE (FLEXERIL) 10 MG TABLET    Take 1 tablet (10 mg total) by mouth 3 (three) times daily as needed.   TRAMADOL (ULTRAM) 50 MG TABLET    Take 1 tablet (50 mg total) by mouth every 6 (six) hours as needed.     Note:  This document was prepared using Dragon voice recognition software and may include unintentional dictation errors.    Sable Feil, PA-C 08/03/16 [place], PA-C 08/03/16 1936    Nance Pear, MD 08/03/16 [phone]

## 2016-08-03 NOTE — ED Notes (Signed)
Patient c/o upper back pain.

## 2016-08-03 NOTE — ED Triage Notes (Signed)
Presents with upper  pain for couple of days

## 2016-10-13 IMAGING — CR DG RIBS W/ CHEST 3+V*R*
1 series · 3 of 3 positions shown · non-contrast
Comparison: None.

CLINICAL DATA: Right anterior chest wall pain started spontaneously
1 week ago; no known injury or previous history

EXAM:
RIGHT RIBS AND CHEST - 3+ VIEW

[Series 1: dg ribs unilateral w/chest right · 0.14mm/px · 3 of 3 slices shown]
[im 1/3]
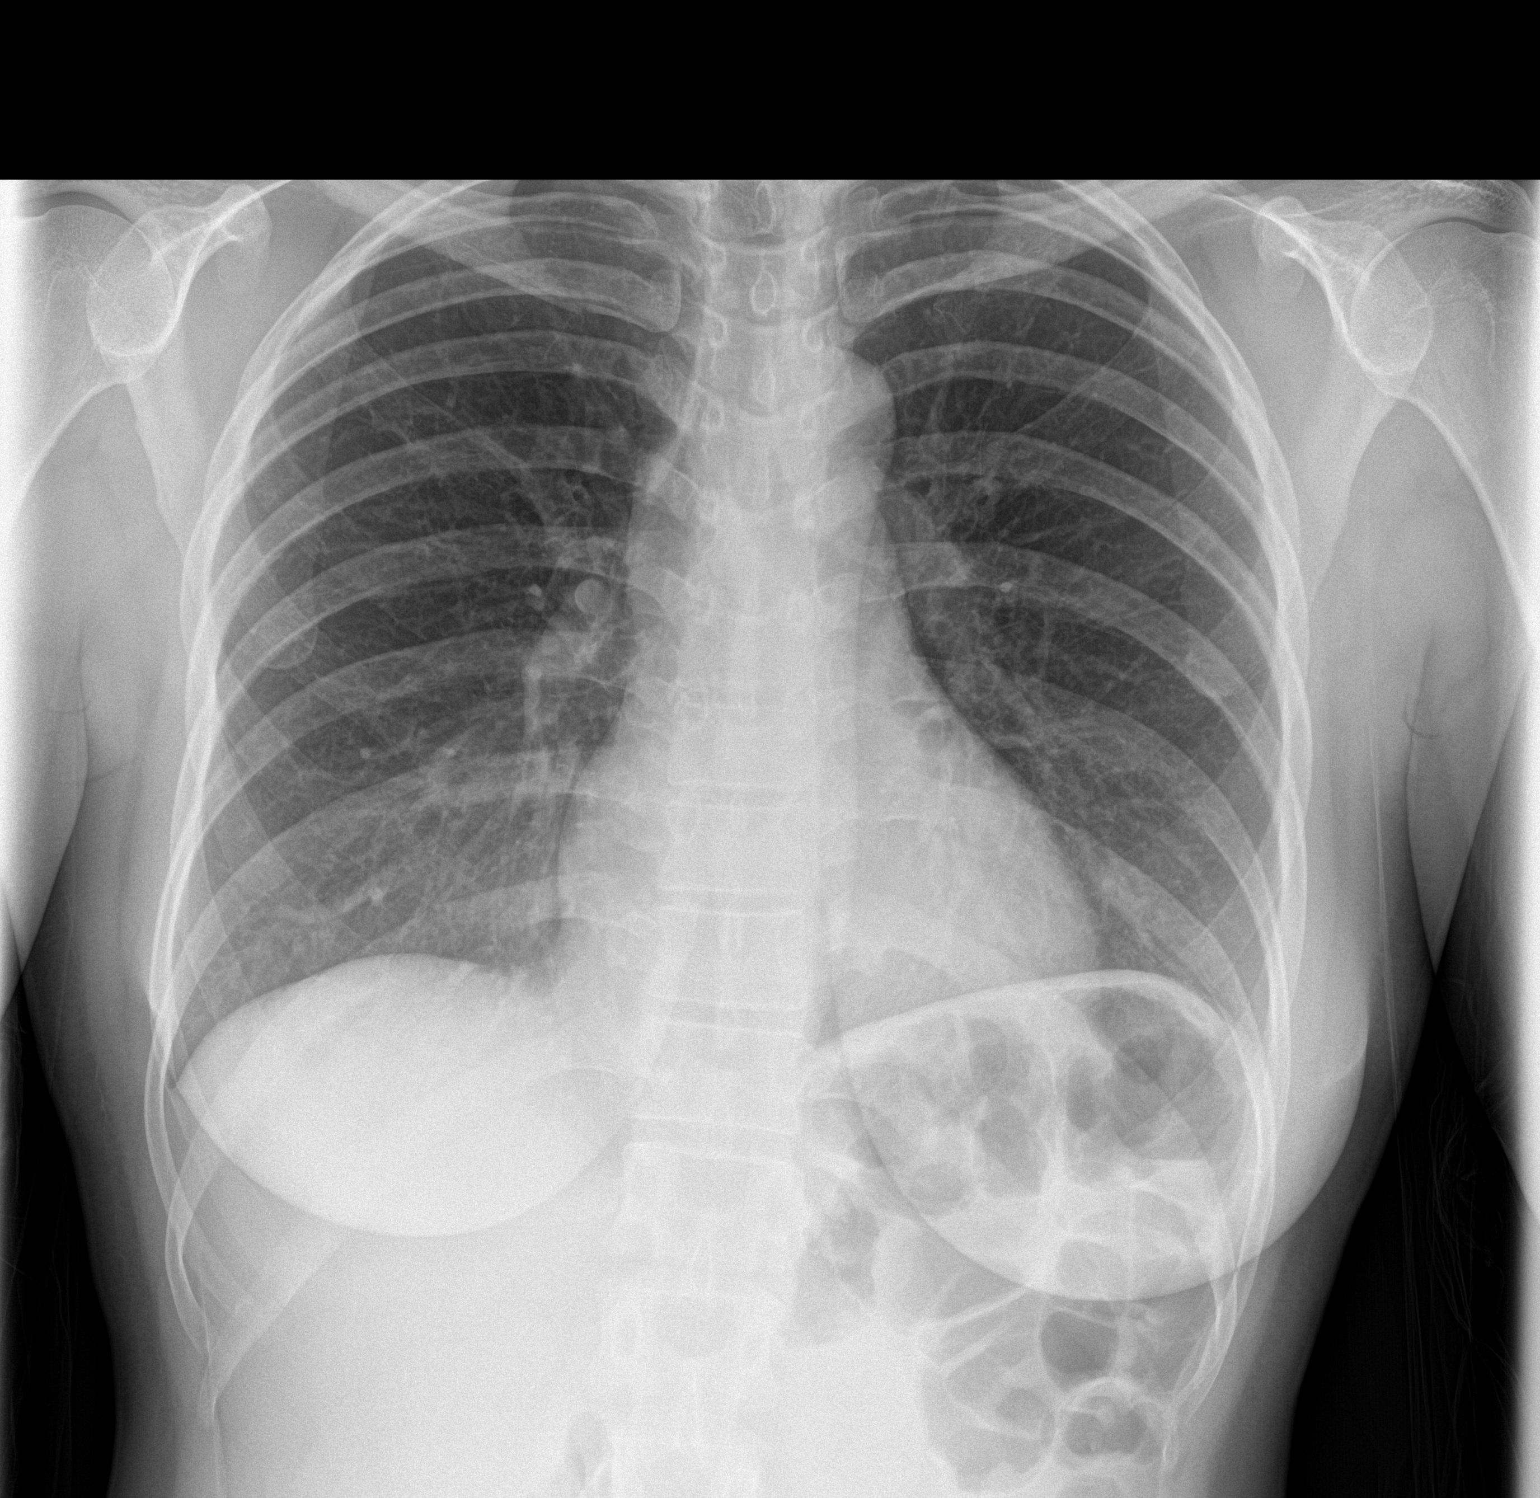
[im 2/3]
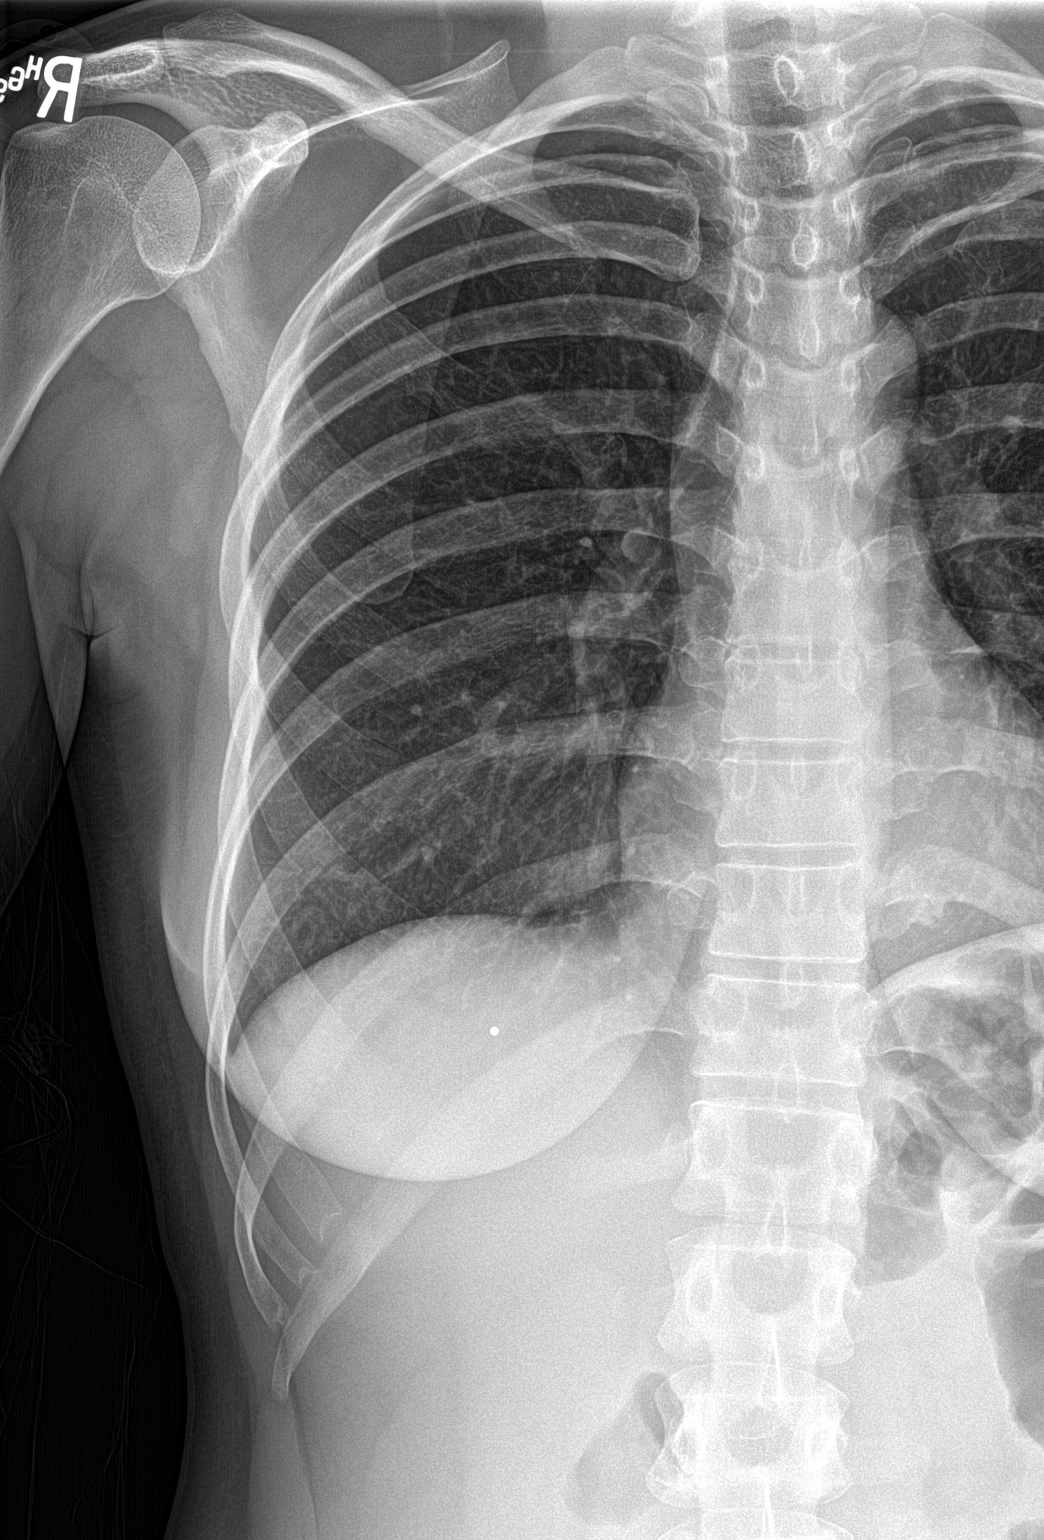
[im 3/3]
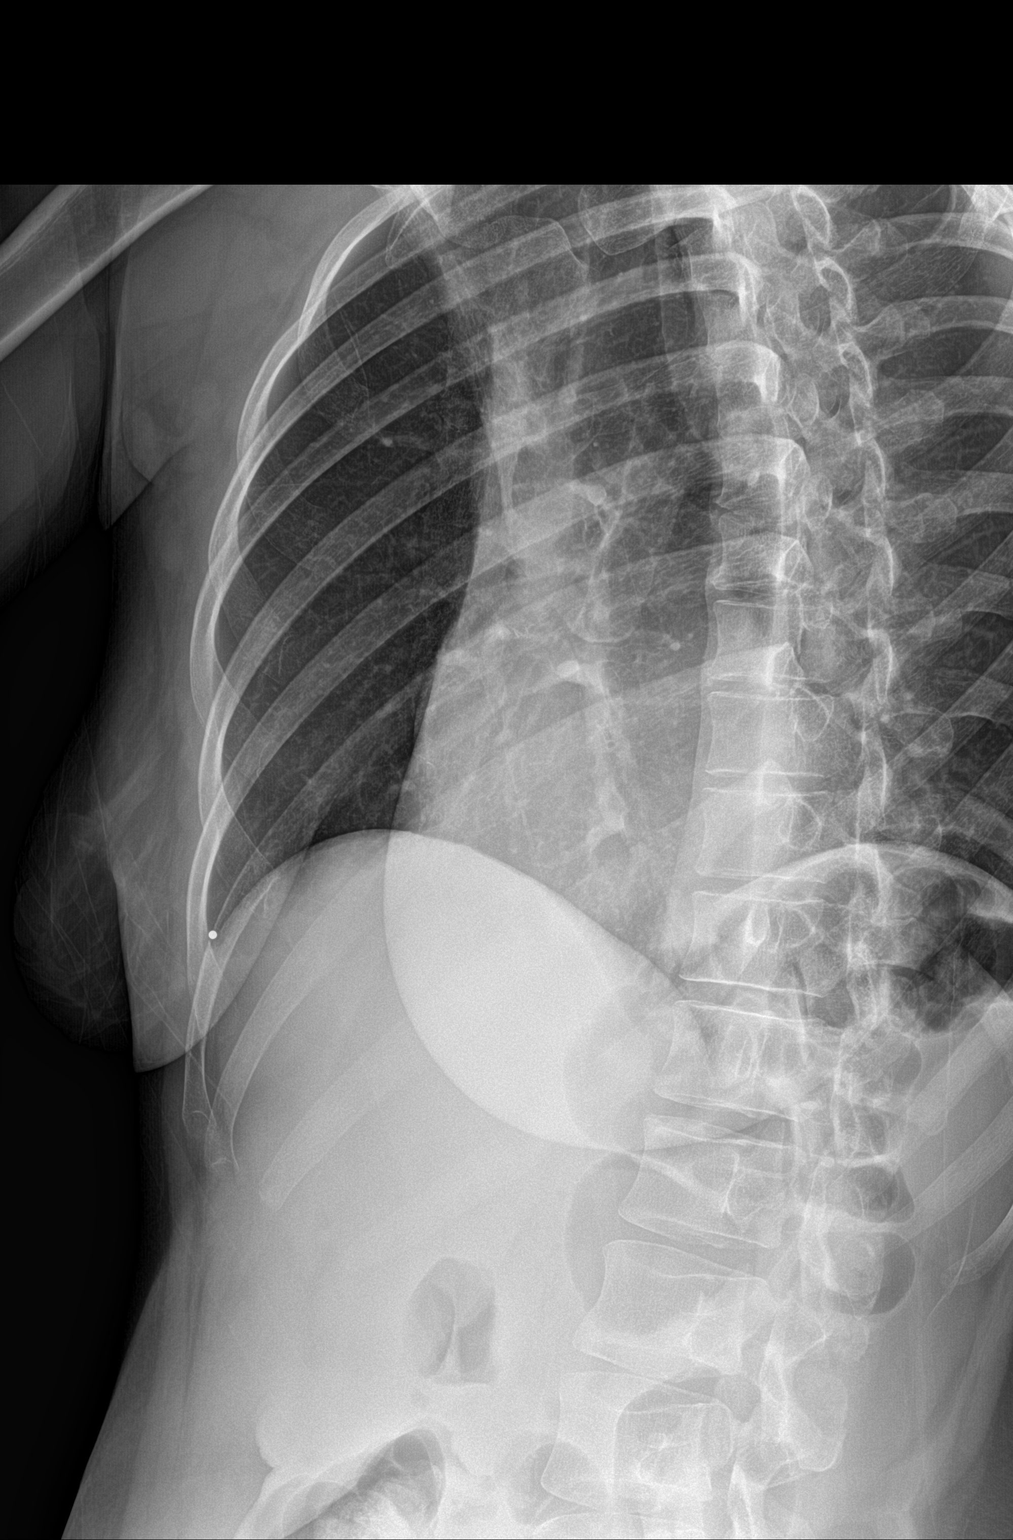

[3 of 3 positions shown; findings below may reference images not displayed]

FINDINGS: No fracture or other bone lesions are seen involving the ribs. There
is no evidence of pneumothorax or pleural effusion. Both lungs are
clear. Heart size and mediastinal contours are within normal limits.
IMPRESSION: Negative.

## 2016-11-14 ENCOUNTER — Emergency Department: Payer: Self-pay

## 2016-11-14 ENCOUNTER — Encounter: Payer: Self-pay | Admitting: Emergency Medicine

## 2016-11-14 ENCOUNTER — Inpatient Hospital Stay
Admission: EM | Admit: 2016-11-14 | Discharge: 2016-11-15 | DRG: 816 | Disposition: A | Discharge status: Home / Self Care | Payer: Self-pay | Attending: Specialist | Admitting: Specialist

## 2016-11-14 DIAGNOSIS — F17210 Nicotine dependence, cigarettes, uncomplicated: Secondary | ICD-10-CM | POA: Diagnosis present

## 2016-11-14 DIAGNOSIS — R1900 Intra-abdominal and pelvic swelling, mass and lump, unspecified site: Secondary | ICD-10-CM

## 2016-11-14 DIAGNOSIS — D72829 Elevated white blood cell count, unspecified: Secondary | ICD-10-CM | POA: Diagnosis present

## 2016-11-14 DIAGNOSIS — R1030 Lower abdominal pain, unspecified: Secondary | ICD-10-CM

## 2016-11-14 DIAGNOSIS — R590 Localized enlarged lymph nodes: Principal | ICD-10-CM | POA: Diagnosis present

## 2016-11-14 DIAGNOSIS — M130 Polyarthritis, unspecified: Secondary | ICD-10-CM | POA: Diagnosis present

## 2016-11-14 DIAGNOSIS — R109 Unspecified abdominal pain: Secondary | ICD-10-CM | POA: Diagnosis present

## 2016-11-14 DIAGNOSIS — Z886 Allergy status to analgesic agent status: Secondary | ICD-10-CM

## 2016-11-14 DIAGNOSIS — Z806 Family history of leukemia: Secondary | ICD-10-CM

## 2016-11-14 DIAGNOSIS — R112 Nausea with vomiting, unspecified: Secondary | ICD-10-CM

## 2016-11-14 DIAGNOSIS — Z6830 Body mass index (BMI) 30.0-30.9, adult: Secondary | ICD-10-CM

## 2016-11-14 DIAGNOSIS — C8590 Non-Hodgkin lymphoma, unspecified, unspecified site: Secondary | ICD-10-CM

## 2016-11-14 LAB — COMPREHENSIVE METABOLIC PANEL
ALT: 9 U/L — ABNORMAL LOW (ref 14–54)
AST: 15 U/L (ref 15–41)
Albumin: 3.9 g/dL (ref 3.5–5.0)
Alkaline Phosphatase: 71 U/L (ref 38–126)
Anion gap: 11 (ref 5–15)
BUN: 8 mg/dL (ref 6–20)
CHLORIDE: 103 mmol/L (ref 101–111)
CO2: 21 mmol/L — AB (ref 22–32)
Calcium: 9 mg/dL (ref 8.9–10.3)
Creatinine, Ser: 0.93 mg/dL (ref 0.44–1.00)
Glucose, Bld: 89 mg/dL (ref 65–99)
POTASSIUM: 3.7 mmol/L (ref 3.5–5.1)
SODIUM: 135 mmol/L (ref 135–145)
Total Bilirubin: 0.7 mg/dL (ref 0.3–1.2)
Total Protein: 8.8 g/dL — ABNORMAL HIGH (ref 6.5–8.1)

## 2016-11-14 LAB — CBC
HEMATOCRIT: 37.1 % (ref 35.0–47.0)
HEMOGLOBIN: 13 g/dL (ref 12.0–16.0)
MCH: 28.5 pg (ref 26.0–34.0)
MCHC: 34.9 g/dL (ref 32.0–36.0)
MCV: 81.7 fL (ref 80.0–100.0)
Platelets: 296 10*3/uL (ref 150–440)
RBC: 4.54 MIL/uL (ref 3.80–5.20)
RDW: 14.1 % (ref 11.5–14.5)
WBC: 17.9 10*3/uL — ABNORMAL HIGH (ref 3.6–11.0)

## 2016-11-14 LAB — SEDIMENTATION RATE: SED RATE: 1 mm/h (ref 0–20)

## 2016-11-14 LAB — TSH: TSH: 0.68 u[IU]/mL (ref 0.350–4.500)

## 2016-11-14 LAB — URINALYSIS, COMPLETE (UACMP) WITH MICROSCOPIC
BACTERIA UA: NONE SEEN
BILIRUBIN URINE: NEGATIVE
Glucose, UA: NEGATIVE mg/dL
Hgb urine dipstick: NEGATIVE
KETONES UR: 5 mg/dL — AB
Nitrite: NEGATIVE
PH: 5 (ref 5.0–8.0)
PROTEIN: 30 mg/dL — AB
RBC / HPF: NONE SEEN RBC/hpf (ref 0–5)
Specific Gravity, Urine: 1.027 (ref 1.005–1.030)

## 2016-11-14 LAB — RAPID HIV SCREEN (HIV 1/2 AB+AG)
HIV 1/2 Antibodies: NONREACTIVE
HIV-1 P24 ANTIGEN - HIV24: NONREACTIVE

## 2016-11-14 LAB — LIPASE, BLOOD: LIPASE: 17 U/L (ref 11–51)

## 2016-11-14 LAB — PREGNANCY, URINE: PREG TEST UR: NEGATIVE

## 2016-11-14 LAB — LACTATE DEHYDROGENASE: LDH: 148 U/L (ref 98–192)

## 2016-11-14 MED ORDER — ACETAMINOPHEN 325 MG PO TABS: 650.0000 mg | ORAL_TABLET | Freq: Four times a day (QID) | ORAL | Status: DC | PRN

## 2016-11-14 MED ORDER — ONDANSETRON HCL 4 MG PO TABS: 4.0000 mg | ORAL_TABLET | Freq: Four times a day (QID) | ORAL | Status: DC | PRN

## 2016-11-14 MED ORDER — MORPHINE SULFATE (PF) 2 MG/ML IV SOLN
INTRAVENOUS | Status: AC
  Filled 2016-11-14: qty 2

## 2016-11-14 MED ORDER — ACETAMINOPHEN 650 MG RE SUPP: 650.0000 mg | Freq: Four times a day (QID) | RECTAL | Status: DC | PRN

## 2016-11-14 MED ORDER — HEPARIN SODIUM (PORCINE) 5000 UNIT/ML IJ SOLN
5000.0000 [IU] | Freq: Three times a day (TID) | INTRAMUSCULAR | Status: DC
  Administered 2016-11-14 – 2016-11-15 (×4): 5000 [IU] via SUBCUTANEOUS
  Filled 2016-11-14 (×4): qty 1

## 2016-11-14 MED ORDER — METRONIDAZOLE 500 MG PO TABS
500.0000 mg | ORAL_TABLET | Freq: Three times a day (TID) | ORAL | Status: DC
Start: 2016-11-14 — End: 2016-11-15
  Administered 2016-11-14 – 2016-11-15 (×4): 500 mg via ORAL
  Filled 2016-11-14 (×4): qty 1

## 2016-11-14 MED ORDER — ONDANSETRON HCL 4 MG/2ML IJ SOLN
4.0000 mg | Freq: Once | INTRAMUSCULAR | Status: AC
  Administered 2016-11-14: 4 mg via INTRAVENOUS
  Filled 2016-11-14: qty 2

## 2016-11-14 MED ORDER — IOPAMIDOL (ISOVUE-300) INJECTION 61%
30.0000 mL | Freq: Once | INTRAVENOUS | Status: AC | PRN
  Administered 2016-11-14: 30 mL via ORAL

## 2016-11-14 MED ORDER — SODIUM CHLORIDE 0.9 % IV SOLN
INTRAVENOUS | Status: DC
  Administered 2016-11-14 – 2016-11-15 (×3): via INTRAVENOUS

## 2016-11-14 MED ORDER — SODIUM CHLORIDE 0.9 % IV BOLUS (SEPSIS)
1000.0000 mL | Freq: Once | INTRAVENOUS | Status: AC
  Administered 2016-11-14: 1000 mL via INTRAVENOUS

## 2016-11-14 MED ORDER — IOPAMIDOL (ISOVUE-300) INJECTION 61%
100.0000 mL | Freq: Once | INTRAVENOUS | Status: AC | PRN
  Administered 2016-11-14: 100 mL via INTRAVENOUS

## 2016-11-14 MED ORDER — MORPHINE SULFATE (PF) 4 MG/ML IV SOLN
4.0000 mg | Freq: Once | INTRAVENOUS | Status: AC
  Administered 2016-11-14: 4 mg via INTRAVENOUS
  Filled 2016-11-14: qty 1

## 2016-11-14 MED ORDER — ONDANSETRON HCL 4 MG/2ML IJ SOLN
4.0000 mg | Freq: Four times a day (QID) | INTRAMUSCULAR | Status: DC | PRN
  Administered 2016-11-14: 4 mg via INTRAVENOUS
  Filled 2016-11-14: qty 2

## 2016-11-14 MED ORDER — HYDROCODONE-ACETAMINOPHEN 5-325 MG PO TABS
1.0000 | ORAL_TABLET | ORAL | Status: DC | PRN
  Administered 2016-11-14: 2 via ORAL
  Filled 2016-11-14: qty 2

## 2016-11-14 MED ORDER — MORPHINE SULFATE (PF) 4 MG/ML IV SOLN
4.0000 mg | Freq: Once | INTRAVENOUS | Status: AC
  Administered 2016-11-14: 4 mg via INTRAVENOUS

## 2016-11-14 MED ORDER — MORPHINE SULFATE (PF) 2 MG/ML IV SOLN
2.0000 mg | INTRAVENOUS | Status: DC | PRN
  Administered 2016-11-14 – 2016-11-15 (×5): 2 mg via INTRAVENOUS
  Filled 2016-11-14 (×5): qty 1

## 2016-11-14 MED ORDER — PROMETHAZINE HCL 25 MG/ML IJ SOLN
12.5000 mg | Freq: Four times a day (QID) | INTRAMUSCULAR | Status: DC | PRN
  Administered 2016-11-14 – 2016-11-15 (×3): 12.5 mg via INTRAVENOUS
  Filled 2016-11-14 (×3): qty 1

## 2016-11-14 MED ORDER — CIPROFLOXACIN IN D5W 400 MG/200ML IV SOLN
400.0000 mg | Freq: Two times a day (BID) | INTRAVENOUS | Status: DC
  Administered 2016-11-14 – 2016-11-15 (×2): 400 mg via INTRAVENOUS
  Filled 2016-11-14 (×4): qty 200

## 2016-11-14 MED ORDER — NICOTINE 21 MG/24HR TD PT24
21.0000 mg | MEDICATED_PATCH | Freq: Every day | TRANSDERMAL | Status: DC
  Administered 2016-11-14 – 2016-11-15 (×2): 21 mg via TRANSDERMAL
  Filled 2016-11-14 (×2): qty 1

## 2016-11-14 NOTE — ED Triage Notes (Signed)
C/O generalized abdominal cramping x 3-4 days.  Denies nausea and vomiting.

## 2016-11-14 NOTE — ED Notes (Signed)
Pt vomited up oral contrast. MD aware. Verbal orders received.

## 2016-11-14 NOTE — H&P (Signed)
[facility] NAME: Regina Carpenter    MR#:  [id number]  DATE OF BIRTH:  06-28-1976  DATE OF ADMISSION:  11/14/2016  PRIMARY CARE PHYSICIAN: No PCP Per Patient   REQUESTING/REFERRING PHYSICIAN: Harvest Dark MD  CHIEF COMPLAINT:   Chief Complaint  Patient presents with  . Abdominal Pain    HISTORY OF PRESENT ILLNESS: Regina Carpenter  is a 41 y.o. female with a known history of Generalized arthritis who is presenting to the emergency room complaining of generalized abdominal pain ongoing for the past 3-4 days. She describes the pain as sharp in nature associated with cramping and squeezing. She is described as constant Pain. She's also had nausea and vomiting associated with it. No diarrhea. She is not sure if she had any fevers. She has not had any weight loss.  PAST MEDICAL HISTORY:   Past Medical History:  Diagnosis Date  . Arthritis     PAST SURGICAL HISTORY: Past Surgical History:  Procedure Laterality Date  . denies      SOCIAL HISTORY:  Social History  Substance Use Topics  . Smoking status: Current Every Day Smoker    Packs/day: 1.00    Types: Cigarettes  . Smokeless tobacco: Never Used  . Alcohol use No    FAMILY HISTORY:  Family History  Problem Relation Age of Onset  . Leukemia Maternal Grandfather     DRUG ALLERGIES:  Allergies  Allergen Reactions  . Ibuprofen Other (See Comments)    Pt states that she sweats profusely after taking ibuprofen    REVIEW OF SYSTEMS:   CONSTITUTIONAL: No fever, fatigue or weakness.  EYES: No blurred or double vision.  EARS, NOSE, AND THROAT: No tinnitus or ear pain.  RESPIRATORY: No cough, shortness of breath, wheezing or hemoptysis.  CARDIOVASCULAR: No chest pain, orthopnea, edema.  GASTROINTESTINAL: Positive nausea, positive vomiting, no diarrhea or positive abdominal pain.  GENITOURINARY: No dysuria, hematuria.  ENDOCRINE: No polyuria, nocturia,  HEMATOLOGY:  No anemia, easy bruising or bleeding SKIN: No rash or lesion. MUSCULOSKELETAL: No joint pain or arthritis.   NEUROLOGIC: No tingling, numbness, weakness.  PSYCHIATRY: No anxiety or depression.   MEDICATIONS AT HOME:  Prior to Admission medications   Medication Sig Start Date End Date Taking? Authorizing Provider  cyclobenzaprine (FLEXERIL) 10 MG tablet Take 1 tablet (10 mg total) by mouth 3 (three) times daily as needed. Patient not taking: Reported on 11/14/2016 08/03/16   Sable Feil, PA-C  ferrous sulfate (EQL SLOW RELEASE IRON) 160 (50 Fe) MG TBCR SR tablet Take 1 tablet (160 mg total) by mouth daily. Patient not taking: Reported on 11/14/2016 01/28/16   Earleen Newport, MD  tobramycin (TOBREX) 0.3 % ophthalmic solution Place 2 drops into the right eye every 4 (four) hours. While awake Patient not taking: Reported on 11/14/2016 02/16/16   Johnn Hai, PA-C  traMADol (ULTRAM) 50 MG tablet Take 1 tablet (50 mg total) by mouth every 6 (six) hours as needed. Patient not taking: Reported on 11/14/2016 08/03/16 08/03/17  Sable Feil, PA-C      PHYSICAL EXAMINATION:   VITAL SIGNS: Blood pressure 106/66, pulse 99, temperature 98.6 F (37 C), temperature source Oral, resp. rate 17, height 5\' 5"  (1.651 m), weight 181 lb (82.1 kg), last menstrual period 11/04/2016, SpO2 100 %.  GENERAL:  41 y.o.-year-old patient lying in the bed with no acute distress.  EYES: Pupils equal, round, reactive to light and accommodation. No scleral  icterus. Extraocular muscles intact.  HEENT: Head atraumatic, normocephalic. Oropharynx and nasopharynx clear.  NECK:  Supple, no jugular venous distention. No thyroid enlargement, no tenderness.  LUNGS: Normal breath sounds bilaterally, no wheezing, rales,rhonchi or crepitation. No use of accessory muscles of respiration.  CARDIOVASCULAR: S1, S2 normal. No murmurs, rubs, or gallops.  ABDOMEN: Soft Positive epigastric tenderness nondistended. Bowel sounds  present. No organomegaly or mass.  EXTREMITIES: No pedal edema, cyanosis, or clubbing.  NEUROLOGIC: Cranial nerves II through XII are intact. Muscle strength 5/5 in all extremities. Sensation intact. Gait not checked.  PSYCHIATRIC: The patient is alert and oriented x 3.  SKIN: No obvious rash, lesion, or ulcer.   LABORATORY PANEL:   CBC  Recent Labs Lab 11/14/16 0839  WBC 17.9*  HGB 13.0  HCT 37.1  PLT 296  MCV 81.7  MCH 28.5  MCHC 34.9  RDW 14.1   ------------------------------------------------------------------------------------------------------------------  Chemistries   Recent Labs Lab 11/14/16 0839  NA 135  K 3.7  CL 103  CO2 21*  GLUCOSE 89  BUN 8  CREATININE 0.93  CALCIUM 9.0  AST 15  ALT 9*  ALKPHOS 71  BILITOT 0.7   ------------------------------------------------------------------------------------------------------------------ estimated creatinine clearance is 84.2 mL/min (by C-G formula based on SCr of 0.93 mg/dL). ------------------------------------------------------------------------------------------------------------------ No results for input(s): TSH, T4TOTAL, T3FREE, THYROIDAB in the last 72 hours.  Invalid input(s): FREET3   Coagulation profile No results for input(s): INR, PROTIME in the last 168 hours. ------------------------------------------------------------------------------------------------------------------- No results for input(s): DDIMER in the last 72 hours. -------------------------------------------------------------------------------------------------------------------  Cardiac Enzymes No results for input(s): CKMB, TROPONINI, MYOGLOBIN in the last 168 hours.  Invalid input(s): CK ------------------------------------------------------------------------------------------------------------------ Invalid input(s):  POCBNP  ---------------------------------------------------------------------------------------------------------------  Urinalysis    Component Value Date/Time   COLORURINE YELLOW (A) 11/14/2016 0839   APPEARANCEUR HAZY (A) 11/14/2016 0839   LABSPEC 1.027 11/14/2016 0839   PHURINE 5.0 11/14/2016 0839   GLUCOSEU NEGATIVE 11/14/2016 0839   HGBUR NEGATIVE 11/14/2016 0839   BILIRUBINUR NEGATIVE 11/14/2016 0839   KETONESUR 5 (A) 11/14/2016 0839   PROTEINUR 30 (A) 11/14/2016 0839   NITRITE NEGATIVE 11/14/2016 0839   LEUKOCYTESUR MODERATE (A) 11/14/2016 0839     RADIOLOGY: Ct Abdomen Pelvis W Contrast  Result Date: 11/14/2016 CLINICAL DATA:  Abdominal pain and cramping for 3-4 days. Leukocytosis. EXAM: CT ABDOMEN AND PELVIS WITH CONTRAST TECHNIQUE: Multidetector CT imaging of the abdomen and pelvis was performed using the standard protocol following bolus administration of intravenous contrast. CONTRAST:  119mL ISOVUE-300 IOPAMIDOL (ISOVUE-300) INJECTION 61% COMPARISON:  None. FINDINGS: Lower chest: Mild thickening of the distal esophagus as seen on series 2, image 10. The lung bases are otherwise within normal limits. Hepatobiliary: No focal liver abnormality is seen. No gallstones, gallbladder wall thickening, or biliary dilatation. Pancreas: The pancreatic duct is mildly prominent measuring up to 3 mm. No obstructing mass is seen. No evidence of pancreatitis. Spleen: Normal in size without focal abnormality. Adrenals/Urinary Tract: Probable cyst in the right kidney. The adrenal glands, kidneys, ureters, and bladder are otherwise normal. Stomach/Bowel: The stomach is normal. The terminal ileum is mildly prominent but there is no adjacent stranding. This is likely due to poor distention. Remainder of the small bowel is normal. The colon and appendix are normal Vascular/Lymphatic: The abdominal aorta and branching vessels are normal. Significant mesenteric adenopathy is identified. There is  associated stranding associated with the abnormal nodes. The largest nodes are in the right lower quadrant measuring 13 mm. Reproductive: A crescent of air on axial image 72 is  thought to be in the vagina of no acute significance. The uterus is retroflexed. Follicles are seen in both ovaries. A dominant follicle on the left measures 2.9 cm. Other: No other abnormalities. Musculoskeletal: No acute or significant osseous findings. IMPRESSION: 1. Mesenteric adenopathy and fat stranding. Sclerosing mesenteritis or lymphoma are the 2 top differential considerations. Metastatic disease and infectious etiologies are considered less likely. Recommend clinical correlation and attention on follow-up. 2. Mild thickening of the distal esophagus. An upper GI or direct visualization could better evaluate if clinically warranted. Electronically Signed   By: Dorise Bullion III M.D   On: 11/14/2016 14:05    EKG: Orders placed or performed during the hospital encounter of 08/03/16  . ED EKG  . ED EKG  . EKG    IMPRESSION AND PLAN: Patient is a 41 year old presenting with complaining of having abdominal pain  1. Abdominal pain with significant mesenteric adenopathy findings concerning for sclerosing mesentericitis  or lymphoma- patient will need a tissue by her diagnosis to confirm patient will likely need laparoscopy or laparotomy for tissue diagnosis I will ask surgery to see I will place patient on empiric antibiotics with Cipro and Flagyl I will check a sedimentation rate, HIV testing We'll control her pain with morphine and antiemetics for nausea There is also differential of lymphoma I will ask oncology to see as well  2. Leukocytosis suspect due to #1 we'll follow her WBC count  3. Nicotine abuse smoking cessation provided I spent 4 minutes on counseling patient regarding smoking cessation nicotine patch will be started on strongly recommended she stop smoking   All the records are reviewed and case  discussed with ED provider. Management plans discussed with the patient, family and they are in agreement.  CODE STATUS: Code Status History    This patient does not have a recorded code status. Please follow your organizational policy for patients in this situation.       TOTAL TIME TAKING CARE OF THIS PATIENT:55 minutes.    Dustin Flock M.D on 11/14/2016 at 3:07 PM  Between 7am to 6pm - Pager - [phone]  After 6pm go to [email or web address] - password [email or web address] [facility]  [facility]  Office  [phone]  CC: Primary care physician; No PCP Per Patient

## 2016-11-14 NOTE — Consult Note (Signed)
Date of Consultation:  11/14/2016  Requesting Physician:  Dustin Flock, MD  Reason for Consultation:  Mesenteric lymphadenopathy  History of Present Illness: Regina Carpenter is a 41 y.o. female who presents with a three-day history of worsening abdominal pain associated with nausea and vomiting. Patient reports that her pain is mostly in the upper mid abdomen although does have some pain radiating towards the left flank. She has had multiple episodes of nausea and vomiting over the last 2 days. Denies any fevers or chills. Denies any chest pain or shortness of breath. Has not had flatus today. Did have a bowel movement this morning but was smaller than her usual.  Denies any weight loss and in fact has gained weight over the past year. Denies any sick contacts at home.  She presented to the emergency room today and workup revealed an elevated white blood cell count of 17.9 and a CT scan extensive lymphadenopathy along the SMA, concerning for sclerosing mesenteritis or possible lymphoma. Surgery has been consulted for possible tissue diagnosis.  Past Medical History: Past Medical History:  Diagnosis Date  . Arthritis      Past Surgical History: Past Surgical History:  Procedure Laterality Date  . None      Home Medications: Prior to Admission medications   Medication Sig Start Date End Date Taking? Authorizing Provider  cyclobenzaprine (FLEXERIL) 10 MG tablet Take 1 tablet (10 mg total) by mouth 3 (three) times daily as needed. Patient not taking: Reported on 11/14/2016 08/03/16   Sable Feil, PA-C  ferrous sulfate (EQL SLOW RELEASE IRON) 160 (50 Fe) MG TBCR SR tablet Take 1 tablet (160 mg total) by mouth daily. Patient not taking: Reported on 11/14/2016 01/28/16   Earleen Newport, MD  tobramycin (TOBREX) 0.3 % ophthalmic solution Place 2 drops into the right eye every 4 (four) hours. While awake Patient not taking: Reported on 11/14/2016 02/16/16   Johnn Hai, PA-C   traMADol (ULTRAM) 50 MG tablet Take 1 tablet (50 mg total) by mouth every 6 (six) hours as needed. Patient not taking: Reported on 11/14/2016 08/03/16 08/03/17  Sable Feil, PA-C    Allergies: Allergies  Allergen Reactions  . Ibuprofen Other (See Comments)    Pt states that she sweats profusely after taking ibuprofen    Social History:  reports that she has been smoking Cigarettes.  She has been smoking about 1.00 pack per day. She has never used smokeless tobacco. She reports that she does not drink alcohol or use drugs.   Family History: Family History  Problem Relation Age of Onset  . Leukemia Maternal Grandfather     Review of Systems: Review of Systems  Constitutional: Negative for chills, fever and weight loss.  HENT: Negative for hearing loss.   Eyes: Negative for blurred vision.  Respiratory: Negative for cough and shortness of breath.   Cardiovascular: Negative for chest pain and leg swelling.  Gastrointestinal: Positive for abdominal pain, nausea and vomiting. Negative for blood in stool, constipation, diarrhea and heartburn.  Genitourinary: Negative for dysuria and hematuria.  Musculoskeletal: Positive for joint pain.  Skin: Negative for rash.  Neurological: Negative for dizziness.  Psychiatric/Behavioral: Negative for depression.  All other systems reviewed and are negative.   Physical Exam BP 104/69   Pulse 93   Temp 98.4 F (36.9 C) (Oral)   Resp 18   Ht 5\' 5"  (1.651 m)   Wt 82.1 kg (181 lb)   LMP 11/04/2016 Comment: neg preg  SpO2 99%  BMI 30.12 kg/m  CONSTITUTIONAL: No acute distress. HEENT:  Normocephalic, atraumatic, extraocular motion intact. NECK: Trachea is midline, and there is no jugular venous distension. LYMPH NODES:  Lymph nodes in the neck are not enlarged. RESPIRATORY:  Lungs are clear, and breath sounds are equal bilaterally. Normal respiratory effort without pathologic use of accessory muscles. CARDIOVASCULAR: Heart is regular  without murmurs, gallops, or rubs. GI: The abdomen is soft, nondistended, tender to palpation in the mid abdomen above the umbilicus with some soreness over the left flank. There were no palpable masses. No peritoneal signs.a Chin did have an episode of emesis during this examination.  MUSCULOSKELETAL:  Normal muscle strength and tone in all four extremities.  No peripheral edema or cyanosis. SKIN: Skin turgor is normal. There are no pathologic skin lesions.  NEUROLOGIC:  Motor and sensation is grossly normal.  Cranial nerves are grossly intact. PSYCH:  Alert and oriented to person, place and time. Affect is normal.  Laboratory Analysis: Results for orders placed or performed during the hospital encounter of 11/14/16 (from the past 24 hour(s))  Lipase, blood     Status: None   Collection Time: 11/14/16  8:39 AM  Result Value Ref Range   Lipase 17 11 - 51 U/L  Comprehensive metabolic panel     Status: Abnormal   Collection Time: 11/14/16  8:39 AM  Result Value Ref Range   Sodium 135 135 - 145 mmol/L   Potassium 3.7 3.5 - 5.1 mmol/L   Chloride 103 101 - 111 mmol/L   CO2 21 (L) 22 - 32 mmol/L   Glucose, Bld 89 65 - 99 mg/dL   BUN 8 6 - 20 mg/dL   Creatinine, Ser 0.93 0.44 - 1.00 mg/dL   Calcium 9.0 8.9 - 10.3 mg/dL   Total Protein 8.8 (H) 6.5 - 8.1 g/dL   Albumin 3.9 3.5 - 5.0 g/dL   AST 15 15 - 41 U/L   ALT 9 (L) 14 - 54 U/L   Alkaline Phosphatase 71 38 - 126 U/L   Total Bilirubin 0.7 0.3 - 1.2 mg/dL   GFR calc non Af Amer >60 >60 mL/min   GFR calc Af Amer >60 >60 mL/min   Anion gap 11 5 - 15  CBC     Status: Abnormal   Collection Time: 11/14/16  8:39 AM  Result Value Ref Range   WBC 17.9 (H) 3.6 - 11.0 K/uL   RBC 4.54 3.80 - 5.20 MIL/uL   Hemoglobin 13.0 12.0 - 16.0 g/dL   HCT 37.1 35.0 - 47.0 %   MCV 81.7 80.0 - 100.0 fL   MCH 28.5 26.0 - 34.0 pg   MCHC 34.9 32.0 - 36.0 g/dL   RDW 14.1 11.5 - 14.5 %   Platelets 296 150 - 440 K/uL  Urinalysis, Complete w Microscopic      Status: Abnormal   Collection Time: 11/14/16  8:39 AM  Result Value Ref Range   Color, Urine YELLOW (A) YELLOW   APPearance HAZY (A) CLEAR   Specific Gravity, Urine 1.027 1.005 - 1.030   pH 5.0 5.0 - 8.0   Glucose, UA NEGATIVE NEGATIVE mg/dL   Hgb urine dipstick NEGATIVE NEGATIVE   Bilirubin Urine NEGATIVE NEGATIVE   Ketones, ur 5 (A) NEGATIVE mg/dL   Protein, ur 30 (A) NEGATIVE mg/dL   Nitrite NEGATIVE NEGATIVE   Leukocytes, UA MODERATE (A) NEGATIVE   RBC / HPF NONE SEEN 0 - 5 RBC/hpf   WBC, UA TOO NUMEROUS TO COUNT  0 - 5 WBC/hpf   Bacteria, UA NONE SEEN NONE SEEN   Squamous Epithelial / LPF 6-30 (A) NONE SEEN   Mucous PRESENT   TSH     Status: None   Collection Time: 11/14/16  8:39 AM  Result Value Ref Range   TSH 0.680 0.350 - 4.500 uIU/mL  Pregnancy, urine     Status: None   Collection Time: 11/14/16 11:20 AM  Result Value Ref Range   Preg Test, Ur NEGATIVE NEGATIVE  Lactate dehydrogenase     Status: None   Collection Time: 11/14/16  4:58 PM  Result Value Ref Range   LDH 148 98 - 192 U/L  Sedimentation rate     Status: None   Collection Time: 11/14/16  4:58 PM  Result Value Ref Range   Sed Rate 1 0 - 20 mm/hr  Rapid HIV screen (HIV 1/2 Ab+Ag)     Status: None   Collection Time: 11/14/16  4:58 PM  Result Value Ref Range   HIV-1 P24 Antigen - HIV24 NON REACTIVE NON REACTIVE   HIV 1/2 Antibodies NON REACTIVE NON REACTIVE   Interpretation (HIV Ag Ab)      A non reactive test result means that HIV 1 or HIV 2 antibodies and HIV 1 p24 antigen were not detected in the specimen.    Imaging: Ct Abdomen Pelvis W Contrast  Result Date: 11/14/2016 CLINICAL DATA:  Abdominal pain and cramping for 3-4 days. Leukocytosis. EXAM: CT ABDOMEN AND PELVIS WITH CONTRAST TECHNIQUE: Multidetector CT imaging of the abdomen and pelvis was performed using the standard protocol following bolus administration of intravenous contrast. CONTRAST:  162mL ISOVUE-300 IOPAMIDOL (ISOVUE-300) INJECTION  61% COMPARISON:  None. FINDINGS: Lower chest: Mild thickening of the distal esophagus as seen on series 2, image 10. The lung bases are otherwise within normal limits. Hepatobiliary: No focal liver abnormality is seen. No gallstones, gallbladder wall thickening, or biliary dilatation. Pancreas: The pancreatic duct is mildly prominent measuring up to 3 mm. No obstructing mass is seen. No evidence of pancreatitis. Spleen: Normal in size without focal abnormality. Adrenals/Urinary Tract: Probable cyst in the right kidney. The adrenal glands, kidneys, ureters, and bladder are otherwise normal. Stomach/Bowel: The stomach is normal. The terminal ileum is mildly prominent but there is no adjacent stranding. This is likely due to poor distention. Remainder of the small bowel is normal. The colon and appendix are normal Vascular/Lymphatic: The abdominal aorta and branching vessels are normal. Significant mesenteric adenopathy is identified. There is associated stranding associated with the abnormal nodes. The largest nodes are in the right lower quadrant measuring 13 mm. Reproductive: A crescent of air on axial image 72 is thought to be in the vagina of no acute significance. The uterus is retroflexed. Follicles are seen in both ovaries. A dominant follicle on the left measures 2.9 cm. Other: No other abnormalities. Musculoskeletal: No acute or significant osseous findings. IMPRESSION: 1. Mesenteric adenopathy and fat stranding. Sclerosing mesenteritis or lymphoma are the 2 top differential considerations. Metastatic disease and infectious etiologies are considered less likely. Recommend clinical correlation and attention on follow-up. 2. Mild thickening of the distal esophagus. An upper GI or direct visualization could better evaluate if clinically warranted. Electronically Signed   By: Dorise Bullion III M.D   On: 11/14/2016 14:05    Assessment and Plan: This is a 41 y.o. female who presents with a three-day history  of abdominal pain associated with nausea and vomiting. I have personally reviewed the patient's laboratory studies and  imaging studies. She does have extensive lymphadenopathy around the SMA territory with fat stranding in that area as well. On CT scan there is also some thickening of the jejunal loops but no evidence of bowel obstruction.  -Currently given the patient's nausea and vomiting we'll make the patient nothing by mouth and increase the patient's IV fluids for better hydration.  -Agree with oncology consult and we'll defer to them regarding what type of diagnostic studies they would recommend. If lymph node biopsy is needed, it may be prudent to discuss with interventional radiology if they would be able to access any of the lymph nodes that are enlarged. Otherwise General surgery would be available if a more invasive lymph node biopsy is needed. -We'll continue following along with you.    Melvyn Neth, [place]

## 2016-11-14 NOTE — ED Provider Notes (Signed)
[facility] Emergency Department Provider Note  Time seen: 11:19 AM  I have reviewed the triage vital signs and the nursing notes.   HISTORY  Chief Complaint Abdominal Pain    HPI Cimberly Uresti is a 41 y.o. female with no past medical history who presents to the emergency department with diffuse abdominal pain. According to the patient for the past 3 or 4 days she has been experiencing abdominal pain nausea and vomiting. Denies diarrhea, states a fairly normal bowel movement this morning. Denies black or bloody stool. Denies fever cough or congestion. Denies dysuria, vaginal bleeding or vaginal discharge.  Past Medical History:  Diagnosis Date  . Arthritis     There are no active problems to display for this patient.   History reviewed. No pertinent surgical history.  Prior to Admission medications   Medication Sig Start Date End Date Taking? Authorizing Provider  cyclobenzaprine (FLEXERIL) 10 MG tablet Take 1 tablet (10 mg total) by mouth 3 (three) times daily as needed. Patient not taking: Reported on 11/14/2016 08/03/16   Sable Feil, PA-C  ferrous sulfate (EQL SLOW RELEASE IRON) 160 (50 Fe) MG TBCR SR tablet Take 1 tablet (160 mg total) by mouth daily. Patient not taking: Reported on 11/14/2016 01/28/16   Earleen Newport, MD  tobramycin (TOBREX) 0.3 % ophthalmic solution Place 2 drops into the right eye every 4 (four) hours. While awake Patient not taking: Reported on 11/14/2016 02/16/16   Johnn Hai, PA-C  traMADol (ULTRAM) 50 MG tablet Take 1 tablet (50 mg total) by mouth every 6 (six) hours as needed. Patient not taking: Reported on 11/14/2016 08/03/16 08/03/17  Sable Feil, PA-C    Allergies  Allergen Reactions  . Ibuprofen Other (See Comments)    Pt states that she sweats profusely after taking ibuprofen    No family history on file.  Social History Social History  Substance Use Topics  . Smoking status: Current Every Day  Smoker    Packs/day: 1.00    Types: Cigarettes  . Smokeless tobacco: Never Used  . Alcohol use No    Review of Systems Constitutional: Negative for fever. Cardiovascular: Negative for chest pain. Respiratory: Negative for shortness of breath. Gastrointestinal: Positive for abdominal pain. Positive for nausea and vomiting. Negative for diarrhea. Negative constipation. Neurological: Negative for headache 10-point ROS otherwise negative.  ____________________________________________   PHYSICAL EXAM:  VITAL SIGNS: ED Triage Vitals  Enc Vitals Group     BP 11/14/16 0841 121/74     Pulse Rate 11/14/16 0841 (!) 109     Resp 11/14/16 0841 16     Temp 11/14/16 0841 98.6 F (37 C)     Temp Source 11/14/16 0841 Oral     SpO2 11/14/16 0841 99 %     Weight 11/14/16 0841 181 lb (82.1 kg)     Height 11/14/16 0841 5\' 5"  (1.651 m)     Head Circumference --      Peak Flow --      Pain Score 11/14/16 0840 6     Pain Loc --      Pain Edu? --      Excl. in [place]? --     Constitutional: Alert and oriented. Well appearing and in no distress. Eyes: Normal exam ENT   Head: Normocephalic and atraumatic.   Mouth/Throat: Mucous membranes are moist. Cardiovascular: Normal rate, regular rhythm. No murmur Respiratory: Normal respiratory effort without tachypnea nor retractions. Breath sounds are clear Gastrointestinal: Soft, moderate  diffuse abdominal tenderness palpation more so on the left lower quadrant. No rebound or guarding. No distention. Musculoskeletal: Nontender with normal range of motion in all extremities.  Neurologic:  Normal speech and language. No gross focal neurologic deficits Skin:  Skin is warm, dry and intact.  Psychiatric: Mood and affect are normal.   ____________________________________________   RADIOLOGY  CT concerning for sclerosing mesenteritis versus lymphoma versus other neoplastic process.  ____________________________________________   INITIAL  IMPRESSION / ASSESSMENT AND PLAN / ED COURSE  Pertinent labs & imaging results that were available during my care of the patient were reviewed by me and considered in my medical decision making (see chart for details).  The patient presents the emergency department with diffuse abdominal discomfort especially in the left lower quadrant on exam. White blood cell count of 17,000, urinalysis does show too numerous to count white blood cells, we will add on a urine culture while awaiting CT results. Treat with pain and nausea medication as well as IV fluids.  CT shows concerning findings for possible lymphoma, sclerosing mesenteritis or other neoplastic process. Patient continues to have abdominal pain, continues to be nauseated vomited or contrast. Given the patient's continued pain and nausea will admit to the medical service for further workup and hopefully biopsy for conclusive diagnosis.  ____________________________________________   FINAL CLINICAL IMPRESSION(S) / ED DIAGNOSES  Nausea vomiting Abdominal pain Pelvic mass   Harvest Dark, MD 11/14/16 1430

## 2016-11-15 ENCOUNTER — Inpatient Hospital Stay: Payer: Self-pay

## 2016-11-15 DIAGNOSIS — R590 Localized enlarged lymph nodes: Principal | ICD-10-CM

## 2016-11-15 DIAGNOSIS — R1031 Right lower quadrant pain: Secondary | ICD-10-CM

## 2016-11-15 DIAGNOSIS — M1990 Unspecified osteoarthritis, unspecified site: Secondary | ICD-10-CM

## 2016-11-15 DIAGNOSIS — R112 Nausea with vomiting, unspecified: Secondary | ICD-10-CM

## 2016-11-15 DIAGNOSIS — F17210 Nicotine dependence, cigarettes, uncomplicated: Secondary | ICD-10-CM

## 2016-11-15 LAB — CBC WITH DIFFERENTIAL/PLATELET
Basophils Absolute: 0 10*3/uL (ref 0–0.1)
Basophils Relative: 0 %
EOS ABS: 0.6 10*3/uL (ref 0–0.7)
EOS PCT: 4 %
HCT: 32.6 % — ABNORMAL LOW (ref 35.0–47.0)
HEMOGLOBIN: 11.3 g/dL — AB (ref 12.0–16.0)
LYMPHS ABS: 2 10*3/uL (ref 1.0–3.6)
LYMPHS PCT: 12 %
MCH: 28.5 pg (ref 26.0–34.0)
MCHC: 34.8 g/dL (ref 32.0–36.0)
MCV: 82 fL (ref 80.0–100.0)
MONOS PCT: 6 %
Monocytes Absolute: 1 10*3/uL — ABNORMAL HIGH (ref 0.2–0.9)
Neutro Abs: 12.5 10*3/uL — ABNORMAL HIGH (ref 1.4–6.5)
Neutrophils Relative %: 78 %
PLATELETS: 245 10*3/uL (ref 150–440)
RBC: 3.98 MIL/uL (ref 3.80–5.20)
RDW: 14.1 % (ref 11.5–14.5)
WBC: 16 10*3/uL — ABNORMAL HIGH (ref 3.6–11.0)

## 2016-11-15 LAB — HEMOGLOBIN A1C
Hgb A1c MFr Bld: 5.4 % (ref 4.8–5.6)
Mean Plasma Glucose: 108 mg/dL

## 2016-11-15 LAB — BASIC METABOLIC PANEL
Anion gap: 6 (ref 5–15)
BUN: 7 mg/dL (ref 6–20)
CHLORIDE: 105 mmol/L (ref 101–111)
CO2: 21 mmol/L — ABNORMAL LOW (ref 22–32)
Calcium: 8.3 mg/dL — ABNORMAL LOW (ref 8.9–10.3)
Creatinine, Ser: 0.95 mg/dL (ref 0.44–1.00)
GFR calc Af Amer: >60 mL/min (ref >60)
GFR calc non Af Amer: >60 mL/min (ref >60)
GLUCOSE: 108 mg/dL — AB (ref 65–99)
POTASSIUM: 3.4 mmol/L — AB (ref 3.5–5.1)
SODIUM: 132 mmol/L — AB (ref 135–145)

## 2016-11-15 MED ORDER — IOPAMIDOL (ISOVUE-370) INJECTION 76%
75.0000 mL | Freq: Once | INTRAVENOUS | Status: AC | PRN
  Administered 2016-11-15: 75 mL via INTRAVENOUS

## 2016-11-15 MED ORDER — METRONIDAZOLE 500 MG PO TABS: 500.0000 mg | ORAL_TABLET | Freq: Three times a day (TID) | ORAL | 0 refills | Status: AC

## 2016-11-15 MED ORDER — CIPROFLOXACIN HCL 500 MG PO TABS: 500.0000 mg | ORAL_TABLET | Freq: Two times a day (BID) | ORAL | 0 refills | Status: AC

## 2016-11-15 NOTE — Progress Notes (Signed)
Pt seen and examined. 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 Evaluated by oncology. Case D/W Dr. Janese Banks at length. I think that her better seal for tissue diagnosis will be a laparoscopic mesenteric biopsy. She does have significant mesenteric adenopathy that is in close relationship with the SMA and significant vascularity posing a significant risk for a percutaneous intervention. Cousin with the patient in detail about potential diagnostic laparoscopy possible open for tissue diagnosis. At this point she is adamant that she does not want any surgical procedures at this time. She was to go home and then think about her options. She will see me in a couple weeks and will see oncology in 2 weeks as well.  She Does not need any emergent surgical intervention at this time  and the laparoscopic  mesenteric biopsy would be for diagnostic purposes only . I have spent at least 45 minutes in this encounter with the majority of time spent in coordination of her care and discussion with multiple providers.

## 2016-11-15 NOTE — Progress Notes (Signed)
Discharged patient home with husband, reviewed discharge instructions including prescriptions, appointments and how to access my chart, removed Iv access.

## 2016-11-15 NOTE — Progress Notes (Signed)
[place] at [place] was admitted to the Hospital on 11/14/2016 and Discharged  11/15/2016 and should be excused from work/school   for 7 days starting 11/14/2016 , may return to work/school without any restrictions.  Call Dustin Flock MD with questions.  Dustin Flock M.D on 11/15/2016,at 5:04 PM  [place] at [facility]  [phone]

## 2016-11-15 NOTE — Discharge Summary (Signed)
[place] at [place] NAME: Regina Carpenter    MR#:  [id number]  DATE OF BIRTH:  1976/04/17  DATE OF ADMISSION:  11/14/2016 ADMITTING PHYSICIAN: Dustin Flock, MD  DATE OF DISCHARGE: 11/15/2016  PRIMARY CARE PHYSICIAN: No PCP Per Patient    ADMISSION DIAGNOSIS:  Pelvic mass [R19.00] Lower abdominal pain [R10.30] Nausea and vomiting, intractability of vomiting not specified, unspecified vomiting type [R11.2]  DISCHARGE DIAGNOSIS:  Active Problems:   Abdominal pain   Lymphadenopathy, mesenteric   SECONDARY DIAGNOSIS:   Past Medical History:  Diagnosis Date  . Arthritis     HOSPITAL COURSE:   41 year old female with no significant past medical history presents to the hospital due to abdominal pain nausea vomiting and noted to have CT done pelvis findings suggestive of sclerosing mesenteritis versus lymphoma.  1. Abdominal pain-patient's pain was suspected to be secondary to the abnormal CT scan findings as a sole of sclerosing mesenteritis versus lymphoma. Patient does have some mesenteric enlarged lymph nodes. -Patient was admitted to the hospital with supportive care with pain control, empiric IV antibiotics to treat underlying inflammation secondary to infection. -A surgical, oncologic consultation was obtained. Patient definitely needs a excisional biopsy of one of the lymph nodes for definitive diagnosis. General surgery offered this might doing a laparoscopic mesenteric biopsy but the patient does not want that currently and wants to think about it and follow up with surgery as an outpatient. -Patient will be empirically discharged on oral ciprofloxacin, Flagyl and follow up with general surgery as an outpatient for possible laparoscopic biopsy. She is currently tolerating a regular diet well without any nausea vomiting or worsening abdominal pain.  DISCHARGE CONDITIONS:   Stable  CONSULTS OBTAINED:  Treatment Team:  Olean Ree, MD Sindy Guadeloupe, MD  DRUG ALLERGIES:   Allergies  Allergen Reactions  . Ibuprofen Other (See Comments)    Pt states that she sweats profusely after taking ibuprofen    DISCHARGE MEDICATIONS:   Allergies as of 11/15/2016      Reactions   Ibuprofen Other (See Comments)   Pt states that she sweats profusely after taking ibuprofen      Medication List    STOP taking these medications   cyclobenzaprine 10 MG tablet Commonly known as:  FLEXERIL   ferrous sulfate 160 (50 Fe) MG Tbcr SR tablet Commonly known as:  EQL SLOW RELEASE IRON   tobramycin 0.3 % ophthalmic solution Commonly known as:  TOBREX   traMADol 50 MG tablet Commonly known as:  ULTRAM     TAKE these medications   ciprofloxacin 500 MG tablet Commonly known as:  CIPRO Take 1 tablet (500 mg total) by mouth 2 (two) times daily.   metroNIDAZOLE 500 MG tablet Commonly known as:  FLAGYL Take 1 tablet (500 mg total) by mouth every 8 (eight) hours.         DISCHARGE INSTRUCTIONS:   DIET:  Regular diet  DISCHARGE CONDITION:  Stable  ACTIVITY:  Activity as tolerated  OXYGEN:  Home Oxygen: No.   Oxygen Delivery: room air  DISCHARGE LOCATION:  home   If you experience worsening of your admission symptoms, develop shortness of breath, life threatening emergency, suicidal or homicidal thoughts you must seek medical attention immediately by calling 911 or calling your MD immediately  if symptoms less severe.  You Must read complete instructions/literature along with all the possible adverse reactions/side effects for all the Medicines you take and that have been prescribed  to you. Take any new Medicines after you have completely understood and accpet all the possible adverse reactions/side effects.   Please note  You were cared for by a hospitalist during your hospital stay. If you have any questions about your discharge medications or the care you received while you were in the hospital after  you are discharged, you can call the unit and asked to speak with the hospitalist on call if the hospitalist that took care of you is not available. Once you are discharged, your primary care physician will handle any further medical issues. Please note that NO REFILLS for any discharge medications will be authorized once you are discharged, as it is imperative that you return to your primary care physician (or establish a relationship with a primary care physician if you do not have one) for your aftercare needs so that they can reassess your need for medications and monitor your lab values.     Today   Still having some abdominal pain but N/V, fever or diarrhea. Husband at bedside.   VITAL SIGNS:  Blood pressure 108/65, pulse 94, temperature 99.9 F (37.7 C), temperature source Oral, resp. rate 18, height 5\' 5"  (1.651 m), weight 82.1 kg (181 lb), last menstrual period 11/04/2016, SpO2 100 %.  I/O:   Intake/Output Summary (Last 24 hours) at 11/15/16 1501 Last data filed at 11/15/16 1300  Gross per 24 hour  Intake          2910.75 ml  Output              450 ml  Net          2460.75 ml    PHYSICAL EXAMINATION:  GENERAL:  41 y.o.-year-old patient lying in the bed with no acute distress.  EYES: Pupils equal, round, reactive to light and accommodation. No scleral icterus. Extraocular muscles intact.  HEENT: Head atraumatic, normocephalic. Oropharynx and nasopharynx clear.  NECK:  Supple, no jugular venous distention. No thyroid enlargement, no tenderness.  LUNGS: Normal breath sounds bilaterally, no wheezing, rales,rhonchi. No use of accessory muscles of respiration.  CARDIOVASCULAR: S1, S2 normal. No murmurs, rubs, or gallops.  ABDOMEN: Soft, Tender diffusely but no rebound rigidity, non-distended. Bowel sounds present. No organomegaly or mass.  EXTREMITIES: No pedal edema, cyanosis, or clubbing.  NEUROLOGIC: Cranial nerves II through XII are intact. No focal motor or sensory defecits  b/l.  PSYCHIATRIC: The patient is alert and oriented x 3. Good affect.  SKIN: No obvious rash, lesion, or ulcer.   DATA REVIEW:   CBC  Recent Labs Lab 11/15/16 0431  WBC 16.0*  HGB 11.3*  HCT 32.6*  PLT 245    Chemistries   Recent Labs Lab 11/14/16 0839 11/15/16 0431  NA 135 132*  K 3.7 3.4*  CL 103 105  CO2 21* 21*  GLUCOSE 89 108*  BUN 8 7  CREATININE 0.93 0.95  CALCIUM 9.0 8.3*  AST 15  --   ALT 9*  --   ALKPHOS 71  --   BILITOT 0.7  --     Cardiac Enzymes No results for input(s): TROPONINI in the last 168 hours.  Microbiology Results  No results found for this or any previous visit.  RADIOLOGY:  Ct Angio Chest Pe W Or Wo Contrast  Result Date: 11/15/2016 CLINICAL DATA:  Leukocytosis.  Abdominal lymphadenopathy. EXAM: CT ANGIOGRAPHY CHEST WITH CONTRAST TECHNIQUE: Multidetector CT imaging of the chest was performed using the standard protocol during bolus administration of intravenous contrast. Multiplanar CT  image reconstructions and MIPs were obtained to evaluate the vascular anatomy. CONTRAST:  75 mL Isovue 370 COMPARISON:  Abdomen pelvis CT on 11/14/2016 FINDINGS: Cardiovascular: Satisfactory opacification of pulmonary arteries noted, and no pulmonary emboli identified. No evidence of thoracic aortic dissection or aneurysm. Mediastinum/Nodes: No masses or pathologically enlarged lymph nodes identified. Lungs/Pleura: No pulmonary mass, infiltrate, or effusion. Upper abdomen: No acute findings. Musculoskeletal: No suspicious bone lesions or other significant abnormality identified. Review of the MIP images confirms the above findings. IMPRESSION: No evidence of thoracic lymphadenopathy or other acute findings. No evidence of pulmonary embolism. Electronically Signed   By: Earle Gell M.D.   On: 11/15/2016 10:32   Ct Abdomen Pelvis W Contrast  Result Date: 11/14/2016 CLINICAL DATA:  Abdominal pain and cramping for 3-4 days. Leukocytosis. EXAM: CT ABDOMEN AND  PELVIS WITH CONTRAST TECHNIQUE: Multidetector CT imaging of the abdomen and pelvis was performed using the standard protocol following bolus administration of intravenous contrast. CONTRAST:  163mL ISOVUE-300 IOPAMIDOL (ISOVUE-300) INJECTION 61% COMPARISON:  None. FINDINGS: Lower chest: Mild thickening of the distal esophagus as seen on series 2, image 10. The lung bases are otherwise within normal limits. Hepatobiliary: No focal liver abnormality is seen. No gallstones, gallbladder wall thickening, or biliary dilatation. Pancreas: The pancreatic duct is mildly prominent measuring up to 3 mm. No obstructing mass is seen. No evidence of pancreatitis. Spleen: Normal in size without focal abnormality. Adrenals/Urinary Tract: Probable cyst in the right kidney. The adrenal glands, kidneys, ureters, and bladder are otherwise normal. Stomach/Bowel: The stomach is normal. The terminal ileum is mildly prominent but there is no adjacent stranding. This is likely due to poor distention. Remainder of the small bowel is normal. The colon and appendix are normal Vascular/Lymphatic: The abdominal aorta and branching vessels are normal. Significant mesenteric adenopathy is identified. There is associated stranding associated with the abnormal nodes. The largest nodes are in the right lower quadrant measuring 13 mm. Reproductive: A crescent of air on axial image 72 is thought to be in the vagina of no acute significance. The uterus is retroflexed. Follicles are seen in both ovaries. A dominant follicle on the left measures 2.9 cm. Other: No other abnormalities. Musculoskeletal: No acute or significant osseous findings. IMPRESSION: 1. Mesenteric adenopathy and fat stranding. Sclerosing mesenteritis or lymphoma are the 2 top differential considerations. Metastatic disease and infectious etiologies are considered less likely. Recommend clinical correlation and attention on follow-up. 2. Mild thickening of the distal esophagus. An upper  GI or direct visualization could better evaluate if clinically warranted. Electronically Signed   By: Dorise Bullion III M.D   On: 11/14/2016 14:05      Management plans discussed with the patient, family and they are in agreement.  CODE STATUS:     Code Status Orders        Start     Ordered   11/14/16 1633  Full code  Continuous     11/14/16 1632    Code Status History    Date Active Date Inactive Code Status Order ID Comments User Context   This patient has a current code status but no historical code status.      TOTAL TIME TAKING CARE OF THIS PATIENT: 40 minutes.    Henreitta Leber M.D on 11/15/2016 at 3:01 PM  Between 7am to 6pm - Pager - [phone]  After 6pm go to [email or web address] - Technical brewer [place] [facility]  Office  [phone]  CC: Primary care physician; No PCP  Per Patient

## 2016-11-15 NOTE — Care Management (Signed)
Patient to discharge home today.  Patient has rx for cipro and flagy.  Cipro on $4 list list at [facility].  [employer]rx[employer]com coupon provided for flagyl.  Out of pocket cost $10.36.  Patient denies issues obtaining medications.  Patient provided application for Medication Management and [place].  RNCM signing off.

## 2016-11-15 NOTE — Consult Note (Signed)
Hematology/Oncology Consult note [facility] [phone] [phone]  Patient Care Team: No Pcp Per Patient as PCP - General (General Practice)   Name of the patient: Regina Carpenter  [id number]  29-Jun-1976    Reason for consult- mesenteric adenopathy   Requesting physician- Dr. Posey Pronto  Date of visit: 11/15/2016   History of presenting illness- patient is a 41 year old female with a history of arthritis in the past to present to the ER yesterday with symptoms of nausea vomiting and abdominal pain. Patient states that her abdominal pain has been ongoing for the last 1 week or so but the nausea and vomiting started a few days ago and got to a point where she could not eat or drink much. Patient had a CT abdomen and pelvis with contrast on 11/14/2016 which showed significant mesenteric adenopathy and associated stranding. The largest lymph nodes are in the right lower quadrant measuring 13 mm. CTA chest done today did not reveal any evidence of thoracic adenopathy or pulmonary embolism. Patient is currently on a clear liquid diet and reports that her nausea is somewhat better but still persists. She also has ongoing abdominal. Her last bowel movement was yesterday. She denies any symptoms of fevers, chills, unintentional weight loss or drenching night sweats.  ECOG PS- 1  Pain scale- 3   Review of systems- Review of Systems  Constitutional: Positive for malaise/fatigue. Negative for chills, fever and weight loss.  HENT: Negative for congestion, ear discharge and nosebleeds.   Eyes: Negative for blurred vision.  Respiratory: Negative for cough, hemoptysis, sputum production, shortness of breath and wheezing.   Cardiovascular: Negative for chest pain, palpitations, orthopnea and claudication.  Gastrointestinal: Positive for abdominal pain, nausea and vomiting. Negative for blood in stool, constipation, diarrhea, heartburn and melena.  Genitourinary:  Negative for dysuria, flank pain, frequency, hematuria and urgency.  Musculoskeletal: Negative for back pain, joint pain and myalgias.  Skin: Negative for rash.  Neurological: Negative for dizziness, tingling, focal weakness, seizures, weakness and headaches.  Endo/Heme/Allergies: Does not bruise/bleed easily.  Psychiatric/Behavioral: Negative for depression and suicidal ideas. The patient does not have insomnia.     Allergies  Allergen Reactions  . Ibuprofen Other (See Comments)    Pt states that she sweats profusely after taking ibuprofen    Patient Active Problem List   Diagnosis Date Noted  . Abdominal pain 11/14/2016  . Lymphadenopathy, mesenteric      Past Medical History:  Diagnosis Date  . Arthritis      Past Surgical History:  Procedure Laterality Date  . denies      Social History   Social History  . Marital status: Single    Spouse name: N/A  . Number of children: N/A  . Years of education: N/A   Occupational History  . Not on file.   Social History Main Topics  . Smoking status: Current Every Day Smoker    Packs/day: 1.00    Types: Cigarettes  . Smokeless tobacco: Never Used  . Alcohol use No  . Drug use: No  . Sexual activity: Not on file   Other Topics Concern  . Not on file   Social History Narrative  . No narrative on file     Family History  Problem Relation Age of Onset  . Leukemia Maternal Grandfather      Current Facility-Administered Medications:  .  0.9 %  sodium chloride infusion, , Intravenous, Continuous, Olean Ree, MD, Last Rate: 100 mL/hr at 11/15/16 1335 .  acetaminophen (TYLENOL) tablet 650 mg, 650 mg, Oral, Q6H PRN **OR** acetaminophen (TYLENOL) suppository 650 mg, 650 mg, Rectal, Q6H PRN, Dustin Flock, MD .  ciprofloxacin (CIPRO) IVPB 400 mg, 400 mg, Intravenous, Q12H, Dustin Flock, MD, 400 mg at 11/15/16 0331 .  heparin injection 5,000 Units, 5,000 Units, Subcutaneous, Q8H, Dustin Flock, MD, 5,000 Units at  11/15/16 1332 .  HYDROcodone-acetaminophen (NORCO/VICODIN) 5-325 MG per tablet 1-2 tablet, 1-2 tablet, Oral, Q4H PRN, Dustin Flock, MD, 2 tablet at 11/14/16 1700 .  metroNIDAZOLE (FLAGYL) tablet 500 mg, 500 mg, Oral, Q8H, Dustin Flock, MD, 500 mg at 11/15/16 1332 .  morphine 2 MG/ML injection 2 mg, 2 mg, Intravenous, Q4H PRN, Dustin Flock, MD, 2 mg at 11/15/16 1202 .  nicotine (NICODERM CQ - dosed in mg/24 hours) patch 21 mg, 21 mg, Transdermal, Daily, Dustin Flock, MD, 21 mg at 11/15/16 1016 .  ondansetron (ZOFRAN) tablet 4 mg, 4 mg, Oral, Q6H PRN **OR** ondansetron (ZOFRAN) injection 4 mg, 4 mg, Intravenous, Q6H PRN, Dustin Flock, MD, 4 mg at 11/14/16 1755 .  promethazine (PHENERGAN) injection 12.5 mg, 12.5 mg, Intravenous, Q6H PRN, Olean Ree, MD, 12.5 mg at 11/15/16 0440   Physical exam:  Vitals:   11/15/16 0453 11/15/16 0454 11/15/16 0740 11/15/16 1209  BP: (!) 88/55 100/61 103/62 108/65  Pulse: 97 99 90 94  Resp: 19  20   Temp: 98.6 F (37 C)  98.5 F (36.9 C) 99.9 F (37.7 C)  TempSrc: Oral  Oral Oral  SpO2: 97% 98% 100% 100%  Weight:      Height:       Physical Exam  Constitutional: She is oriented to person, place, and time.  Patient is well-developed and nourished and appears in mild distress from pain.  HENT:  Head: Normocephalic and atraumatic.  Eyes: EOM are normal. Pupils are equal, round, and reactive to light.  Neck: Normal range of motion.  Cardiovascular: Normal rate, regular rhythm and normal heart sounds.   Pulmonary/Chest: Effort normal and breath sounds normal.  Abdominal: Soft. Bowel sounds are normal.  Mild diffuse tenderness to palpation throughout  Lymphadenopathy:  No palpable cervical or axillary adenopathy  Neurological: She is alert and oriented to person, place, and time.  Skin: Skin is warm and dry.       CMP Latest Ref Rng & Units 11/15/2016  Glucose 65 - 99 mg/dL 108(H)  BUN 6 - 20 mg/dL 7  Creatinine 0.44 - 1.00 mg/dL 0.95    Sodium 135 - 145 mmol/L 132(L)  Potassium 3.5 - 5.1 mmol/L 3.4(L)  Chloride 101 - 111 mmol/L 105  CO2 22 - 32 mmol/L 21(L)  Calcium 8.9 - 10.3 mg/dL 8.3(L)  Total Protein 6.5 - 8.1 g/dL -  Total Bilirubin 0.3 - 1.2 mg/dL -  Alkaline Phos 38 - 126 U/L -  AST 15 - 41 U/L -  ALT 14 - 54 U/L -   CBC Latest Ref Rng & Units 11/15/2016  WBC 3.6 - 11.0 K/uL 16.0(H)  Hemoglobin 12.0 - 16.0 g/dL 11.3(L)  Hematocrit 35.0 - 47.0 % 32.6(L)  Platelets 150 - 440 K/uL 245      Ct Angio Chest Pe W Or Wo Contrast  Result Date: 11/15/2016 CLINICAL DATA:  Leukocytosis.  Abdominal lymphadenopathy. EXAM: CT ANGIOGRAPHY CHEST WITH CONTRAST TECHNIQUE: Multidetector CT imaging of the chest was performed using the standard protocol during bolus administration of intravenous contrast. Multiplanar CT image reconstructions and MIPs were obtained to evaluate the vascular anatomy. CONTRAST:  75  mL Isovue 370 COMPARISON:  Abdomen pelvis CT on 11/14/2016 FINDINGS: Cardiovascular: Satisfactory opacification of pulmonary arteries noted, and no pulmonary emboli identified. No evidence of thoracic aortic dissection or aneurysm. Mediastinum/Nodes: No masses or pathologically enlarged lymph nodes identified. Lungs/Pleura: No pulmonary mass, infiltrate, or effusion. Upper abdomen: No acute findings. Musculoskeletal: No suspicious bone lesions or other significant abnormality identified. Review of the MIP images confirms the above findings. IMPRESSION: No evidence of thoracic lymphadenopathy or other acute findings. No evidence of pulmonary embolism. Electronically Signed   By: Earle Gell M.D.   On: 11/15/2016 10:32   Ct Abdomen Pelvis W Contrast  Result Date: 11/14/2016 CLINICAL DATA:  Abdominal pain and cramping for 3-4 days. Leukocytosis. EXAM: CT ABDOMEN AND PELVIS WITH CONTRAST TECHNIQUE: Multidetector CT imaging of the abdomen and pelvis was performed using the standard protocol following bolus administration of  intravenous contrast. CONTRAST:  132m ISOVUE-300 IOPAMIDOL (ISOVUE-300) INJECTION 61% COMPARISON:  None. FINDINGS: Lower chest: Mild thickening of the distal esophagus as seen on series 2, image 10. The lung bases are otherwise within normal limits. Hepatobiliary: No focal liver abnormality is seen. No gallstones, gallbladder wall thickening, or biliary dilatation. Pancreas: The pancreatic duct is mildly prominent measuring up to 3 mm. No obstructing mass is seen. No evidence of pancreatitis. Spleen: Normal in size without focal abnormality. Adrenals/Urinary Tract: Probable cyst in the right kidney. The adrenal glands, kidneys, ureters, and bladder are otherwise normal. Stomach/Bowel: The stomach is normal. The terminal ileum is mildly prominent but there is no adjacent stranding. This is likely due to poor distention. Remainder of the small bowel is normal. The colon and appendix are normal Vascular/Lymphatic: The abdominal aorta and branching vessels are normal. Significant mesenteric adenopathy is identified. There is associated stranding associated with the abnormal nodes. The largest nodes are in the right lower quadrant measuring 13 mm. Reproductive: A crescent of air on axial image 72 is thought to be in the vagina of no acute significance. The uterus is retroflexed. Follicles are seen in both ovaries. A dominant follicle on the left measures 2.9 cm. Other: No other abnormalities. Musculoskeletal: No acute or significant osseous findings. IMPRESSION: 1. Mesenteric adenopathy and fat stranding. Sclerosing mesenteritis or lymphoma are the 2 top differential considerations. Metastatic disease and infectious etiologies are considered less likely. Recommend clinical correlation and attention on follow-up. 2. Mild thickening of the distal esophagus. An upper GI or direct visualization could better evaluate if clinically warranted. Electronically Signed   By: DDorise BullionIII M.D   On: 11/14/2016 14:05     Assessment and plan- Patient is a 41y.o. female who presented with nausea vomiting and abdominal pain and found to have significant mesenteric adenopathy on CT abdomen  1. I spoke to Dr. WEarleen Newportfrom IR this morning. At this time we do not have any evidence of peripheral adenopathy other than mesenteric adenopathy noted on CT abdomen which needs to be biopsied to come to a definitive diagnosis. At this point our options are  A. attempt IR guided core biopsy of mesenteric nodes. These nodes are around vascular structures and there are bleeding risks associated with the procedure which patient needs to discuss with IR before proceeding  B. If IR guided biopsy is not feasible surgery may have to consider laparoscopic biopsy- core or excision biopsy for definitive diagnosis. FNA is not desirable since lymphoma is a consideration.   C. PET/CT as an outpatient if her symptoms can be controlled and biopsy the most FDG avid  area which could take up to 2 weeks   Patient would like to think about her options and get back to [place] later today. At this time there is no conclusive evidence of malignancy/lymphoma. She has leukocytosis which could be reactive secondary to inflammation as well and I do not think that a bone marrow biopsy at this time would help [place] with diagnosis.   Thank you for this kind referral and the opportunity to participate in the care of this patient   Visit Diagnosis 1. Lower abdominal pain   2. Pelvic mass   3. Nausea and vomiting, intractability of vomiting not specified, unspecified vomiting type   4. Lymphoma ([place])     Dr. Randa Evens, MD, MPH [facility] Pager- [phone] 11/15/2016 1:45 PM

## 2016-11-16 LAB — URINE CULTURE: CULTURE: NO GROWTH

## 2016-11-16 SURGERY — LAPAROSCOPY, DIAGNOSTIC
Anesthesia: Choice

## 2016-11-17 ENCOUNTER — Telehealth: Payer: Self-pay | Admitting: *Deleted

## 2016-11-17 NOTE — Telephone Encounter (Signed)
Called and left message with pt to call me. That Dr. Janese Banks has seen her in hospital and wanted her to follow up in about 3 weeks and I wanted to see what date and time would be good for pt.  While I was typing the note the patient called back and states that she does not need that appt. She has decided not to do anything right now.  I gave her the number to cancer center if she changes her mind in the future.

## 2016-11-19 ENCOUNTER — Emergency Department
Admission: EM | Admit: 2016-11-19 | Discharge: 2016-11-19 | Disposition: A | Discharge status: Home / Self Care | Payer: Self-pay | Attending: Emergency Medicine | Admitting: Emergency Medicine

## 2016-11-19 DIAGNOSIS — M436 Torticollis: Secondary | ICD-10-CM

## 2016-11-19 DIAGNOSIS — F17210 Nicotine dependence, cigarettes, uncomplicated: Secondary | ICD-10-CM | POA: Insufficient documentation

## 2016-11-19 MED ORDER — CYCLOBENZAPRINE HCL 10 MG PO TABS: 10.0000 mg | ORAL_TABLET | Freq: Three times a day (TID) | ORAL | 0 refills | Status: DC | PRN

## 2016-11-19 MED ORDER — ORPHENADRINE CITRATE 30 MG/ML IJ SOLN
60.0000 mg | Freq: Two times a day (BID) | INTRAMUSCULAR | Status: DC
  Administered 2016-11-19: 60 mg via INTRAMUSCULAR
  Filled 2016-11-19: qty 2

## 2016-11-19 MED ORDER — HYDROMORPHONE HCL 1 MG/ML IJ SOLN
1.0000 mg | Freq: Once | INTRAMUSCULAR | Status: AC
  Administered 2016-11-19: 1 mg via INTRAMUSCULAR
  Filled 2016-11-19: qty 1

## 2016-11-19 MED ORDER — TRAMADOL HCL 50 MG PO TABS: 50.0000 mg | ORAL_TABLET | Freq: Four times a day (QID) | ORAL | 0 refills | Status: DC | PRN

## 2016-11-19 NOTE — ED Provider Notes (Signed)
[facility] Emergency Department Provider Note  ____________________________________________   First MD Initiated Contact with Patient 11/19/16 1211     (approximate)  I have reviewed the triage vital signs and the nursing notes.   HISTORY  Chief Complaint Neck Pain    HPI Regina Carpenter is a 41 y.o. female patient complain of neck pain upon awakening yesterday. Patient denies any provocative incident for his complaint. Patient state increased move her neck from side to side. Patient denies any radicular component to her neck pain. Patient denies any fever or other body aches. No palliative measures taken for this complaint.patient rates the pain patient rates the pain as a 10 over 10. Patient described a pain as "sharp and spasmatic".   Past Medical History:  Diagnosis Date  . Arthritis     Patient Active Problem List   Diagnosis Date Noted  . Abdominal pain 11/14/2016  . Lymphadenopathy, mesenteric     Past Surgical History:  Procedure Laterality Date  . denies      Prior to Admission medications   Medication Sig Start Date End Date Taking? Authorizing Provider  ciprofloxacin (CIPRO) 500 MG tablet Take 1 tablet (500 mg total) by mouth 2 (two) times daily. 11/15/16 11/20/16  Henreitta Leber, MD  cyclobenzaprine (FLEXERIL) 10 MG tablet Take 1 tablet (10 mg total) by mouth 3 (three) times daily as needed. 11/19/16   Sable Feil, PA-C  metroNIDAZOLE (FLAGYL) 500 MG tablet Take 1 tablet (500 mg total) by mouth every 8 (eight) hours. 11/15/16 11/20/16  Henreitta Leber, MD  traMADol (ULTRAM) 50 MG tablet Take 1 tablet (50 mg total) by mouth every 6 (six) hours as needed. 11/19/16 11/19/17  Sable Feil, PA-C    Allergies Ibuprofen  Family History  Problem Relation Age of Onset  . Leukemia Maternal Grandfather     Social History Social History  Substance Use Topics  . Smoking status: Current Every Day Smoker    Packs/day: 1.00    Types:  Cigarettes  . Smokeless tobacco: Never Used  . Alcohol use No    Review of Systems Constitutional: No fever/chills Eyes: No visual changes. ENT: No sore throat. Cardiovascular: Denies chest pain. Respiratory: Denies shortness of breath. Gastrointestinal: No abdominal pain.  No nausea, no vomiting.  No diarrhea.  No constipation. Genitourinary: Negative for dysuria. Musculoskeletal: Neck pain Skin: Negative for rash. Neurological: Negative for headaches, focal weakness or numbness. Allergic/Immunilogical: Ibuprofen  ____________________________________________   PHYSICAL EXAM:  VITAL SIGNS: ED Triage Vitals  Enc Vitals Group     BP 11/19/16 1146 109/74     Pulse Rate 11/19/16 1146 88     Resp 11/19/16 1146 16     Temp 11/19/16 1146 98 F (36.7 C)     Temp Source 11/19/16 1146 Oral     SpO2 11/19/16 1146 100 %     Weight 11/19/16 1144 180 lb (81.6 kg)     Height 11/19/16 1144 5\' 5"  (1.651 m)     Head Circumference --      Peak Flow --      Pain Score --      Pain Loc --      Pain Edu? --      Excl. in [place]? --     Constitutional: Alert and oriented. Well appearing and in no acute distress. Eyes: Conjunctivae are normal. PERRL. EOMI. Head: Atraumatic. Nose: No congestion/rhinnorhea. Mouth/Throat: Mucous membranes are moist.  Oropharynx non-erythematous. Neck: No stridor.  No  cervical spine tenderness to palpation.  Decreased range of motion with lateral movements. Hematological/Lymphatic/Immunilogical: No cervical lymphadenopathy. Cardiovascular: Normal rate, regular rhythm. Grossly normal heart sounds.  Good peripheral circulation. Respiratory: Normal respiratory effort.  No retractions. Lungs CTAB. Gastrointestinal: Soft and nontender. No distention. No abdominal bruits. No CVA tenderness. Musculoskeletal: No lower extremity tenderness nor edema.  No joint effusions. Neurologic:  Normal speech and language. No gross focal neurologic deficits are appreciated. No  gait instability. Skin:  Skin is warm, dry and intact. No rash noted. Psychiatric: Mood and affect are normal. Speech and behavior are normal.  ____________________________________________   LABS (all labs ordered are listed, but only abnormal results are displayed)  Labs Reviewed - No data to display ____________________________________________  EKG   ____________________________________________  RADIOLOGY   ____________________________________________   PROCEDURES  Procedure(s) performed: None  Procedures  Critical Care performed: No  ____________________________________________   INITIAL IMPRESSION / ASSESSMENT AND PLAN / ED COURSE  Pertinent labs & imaging results that were available during my care of the patient were reviewed by me and considered in my medical decision making (see chart for details).  Torticollis. Patient given discharge Instructions. Patient given a prescription for tramadol and Flexeril. Patient given a work note. Patient advised to follow-up with "clinic if condition persists.      ____________________________________________   FINAL CLINICAL IMPRESSION(S) / ED DIAGNOSES  Final diagnoses:  Torticollis      NEW MEDICATIONS STARTED DURING THIS VISIT:  New Prescriptions   CYCLOBENZAPRINE (FLEXERIL) 10 MG TABLET    Take 1 tablet (10 mg total) by mouth 3 (three) times daily as needed.   TRAMADOL (ULTRAM) 50 MG TABLET    Take 1 tablet (50 mg total) by mouth every 6 (six) hours as needed.     Note:  This document was prepared using Dragon voice recognition software and may include unintentional dictation errors.    Sable Feil, PA-C 11/19/16 Hendricks Yao, MD 11/19/16 [phone]

## 2016-11-19 NOTE — ED Triage Notes (Signed)
Pt ambulatory to stat desk with c/o neck pain.

## 2016-11-19 NOTE — ED Triage Notes (Signed)
Pt came to ED via pov c/o neck pain that started yesterday upon waking. Pt denies fall/injury. Reports hurts to move from side to side. Denies fever, chills, body aches.

## 2016-11-23 ENCOUNTER — Inpatient Hospital Stay
Admission: EM | Admit: 2016-11-23 | Discharge: 2016-11-25 | DRG: 553 | Disposition: A | Discharge status: Home / Self Care | Payer: Self-pay | Attending: Internal Medicine | Admitting: Internal Medicine

## 2016-11-23 ENCOUNTER — Emergency Department: Payer: Self-pay

## 2016-11-23 DIAGNOSIS — M542 Cervicalgia: Secondary | ICD-10-CM | POA: Diagnosis present

## 2016-11-23 DIAGNOSIS — J9601 Acute respiratory failure with hypoxia: Secondary | ICD-10-CM | POA: Diagnosis present

## 2016-11-23 DIAGNOSIS — E871 Hypo-osmolality and hyponatremia: Secondary | ICD-10-CM | POA: Diagnosis present

## 2016-11-23 DIAGNOSIS — Z806 Family history of leukemia: Secondary | ICD-10-CM

## 2016-11-23 DIAGNOSIS — E860 Dehydration: Secondary | ICD-10-CM | POA: Diagnosis present

## 2016-11-23 DIAGNOSIS — A419 Sepsis, unspecified organism: Secondary | ICD-10-CM

## 2016-11-23 DIAGNOSIS — M2550 Pain in unspecified joint: Secondary | ICD-10-CM

## 2016-11-23 DIAGNOSIS — H109 Unspecified conjunctivitis: Secondary | ICD-10-CM | POA: Diagnosis present

## 2016-11-23 DIAGNOSIS — M0230 Reiter's disease, unspecified site: Secondary | ICD-10-CM | POA: Diagnosis present

## 2016-11-23 DIAGNOSIS — E876 Hypokalemia: Secondary | ICD-10-CM | POA: Diagnosis present

## 2016-11-23 DIAGNOSIS — M1990 Unspecified osteoarthritis, unspecified site: Secondary | ICD-10-CM | POA: Diagnosis present

## 2016-11-23 DIAGNOSIS — D72829 Elevated white blood cell count, unspecified: Secondary | ICD-10-CM

## 2016-11-23 DIAGNOSIS — M436 Torticollis: Secondary | ICD-10-CM

## 2016-11-23 DIAGNOSIS — H15101 Unspecified episcleritis, right eye: Secondary | ICD-10-CM

## 2016-11-23 DIAGNOSIS — R0902 Hypoxemia: Secondary | ICD-10-CM | POA: Diagnosis present

## 2016-11-23 DIAGNOSIS — R590 Localized enlarged lymph nodes: Secondary | ICD-10-CM | POA: Diagnosis present

## 2016-11-23 DIAGNOSIS — H209 Unspecified iridocyclitis: Secondary | ICD-10-CM | POA: Diagnosis present

## 2016-11-23 DIAGNOSIS — F17210 Nicotine dependence, cigarettes, uncomplicated: Secondary | ICD-10-CM | POA: Diagnosis present

## 2016-11-23 DIAGNOSIS — Z886 Allergy status to analgesic agent status: Secondary | ICD-10-CM

## 2016-11-23 DIAGNOSIS — R531 Weakness: Secondary | ICD-10-CM

## 2016-11-23 DIAGNOSIS — R700 Elevated erythrocyte sedimentation rate: Secondary | ICD-10-CM | POA: Diagnosis present

## 2016-11-23 DIAGNOSIS — H5789 Other specified disorders of eye and adnexa: Secondary | ICD-10-CM

## 2016-11-23 DIAGNOSIS — H15001 Unspecified scleritis, right eye: Secondary | ICD-10-CM

## 2016-11-23 DIAGNOSIS — M609 Myositis, unspecified: Secondary | ICD-10-CM | POA: Diagnosis present

## 2016-11-23 DIAGNOSIS — R52 Pain, unspecified: Secondary | ICD-10-CM

## 2016-11-23 DIAGNOSIS — M064 Inflammatory polyarthropathy: Principal | ICD-10-CM | POA: Diagnosis present

## 2016-11-23 HISTORY — DX: Pain in right hand: M79.641

## 2016-11-23 HISTORY — DX: Cellulitis of left lower limb: L03.116

## 2016-11-23 HISTORY — DX: Tobacco use: Z72.0

## 2016-11-23 HISTORY — DX: Pain in right knee: M25.561

## 2016-11-23 HISTORY — DX: Localized enlarged lymph nodes: R59.0

## 2016-11-23 LAB — BASIC METABOLIC PANEL
Anion gap: 10 (ref 5–15)
BUN: 5 mg/dL — AB (ref 6–20)
CHLORIDE: 96 mmol/L — AB (ref 101–111)
CO2: 25 mmol/L (ref 22–32)
CREATININE: 0.72 mg/dL (ref 0.44–1.00)
Calcium: 9.1 mg/dL (ref 8.9–10.3)
GFR calc Af Amer: >60 mL/min (ref >60)
GFR calc non Af Amer: >60 mL/min (ref >60)
Glucose, Bld: 97 mg/dL (ref 65–99)
POTASSIUM: 3.8 mmol/L (ref 3.5–5.1)
SODIUM: 131 mmol/L — AB (ref 135–145)

## 2016-11-23 LAB — URINALYSIS, COMPLETE (UACMP) WITH MICROSCOPIC
Bilirubin Urine: NEGATIVE
Glucose, UA: NEGATIVE mg/dL
HGB URINE DIPSTICK: NEGATIVE
Ketones, ur: 5 mg/dL — AB
Leukocytes, UA: NEGATIVE
NITRITE: NEGATIVE
PROTEIN: NEGATIVE mg/dL
Specific Gravity, Urine: 1.011 (ref 1.005–1.030)
pH: 6 (ref 5.0–8.0)

## 2016-11-23 LAB — CBC
HEMATOCRIT: 34.6 % — AB (ref 35.0–47.0)
Hemoglobin: 11.6 g/dL — ABNORMAL LOW (ref 12.0–16.0)
MCH: 27.6 pg (ref 26.0–34.0)
MCHC: 33.4 g/dL (ref 32.0–36.0)
MCV: 82.5 fL (ref 80.0–100.0)
Platelets: 346 10*3/uL (ref 150–440)
RBC: 4.19 MIL/uL (ref 3.80–5.20)
RDW: 14.2 % (ref 11.5–14.5)
WBC: 20.7 10*3/uL — ABNORMAL HIGH (ref 3.6–11.0)

## 2016-11-23 LAB — LACTIC ACID, PLASMA: Lactic Acid, Venous: 1.3 mmol/L (ref 0.5–1.9)

## 2016-11-23 LAB — URINE DRUG SCREEN, QUALITATIVE (ARMC ONLY)
Amphetamines, Ur Screen: NOT DETECTED
BARBITURATES, UR SCREEN: NOT DETECTED
Benzodiazepine, Ur Scrn: NOT DETECTED
CANNABINOID 50 NG, UR ~~LOC~~: POSITIVE — AB
COCAINE METABOLITE, UR ~~LOC~~: NOT DETECTED
MDMA (ECSTASY) UR SCREEN: NOT DETECTED
Methadone Scn, Ur: NOT DETECTED
OPIATE, UR SCREEN: NOT DETECTED
Phencyclidine (PCP) Ur S: NOT DETECTED
Tricyclic, Ur Screen: POSITIVE — AB

## 2016-11-23 LAB — HEPATIC FUNCTION PANEL
ALBUMIN: 3 g/dL — AB (ref 3.5–5.0)
ALT: 8 U/L — AB (ref 14–54)
AST: 19 U/L (ref 15–41)
Alkaline Phosphatase: 62 U/L (ref 38–126)
BILIRUBIN TOTAL: 0.6 mg/dL (ref 0.3–1.2)
Bilirubin, Direct: <0.1 mg/dL — ABNORMAL LOW (ref 0.1–0.5)
Total Protein: 8.5 g/dL — ABNORMAL HIGH (ref 6.5–8.1)

## 2016-11-23 LAB — TSH: TSH: 0.843 u[IU]/mL (ref 0.350–4.500)

## 2016-11-23 LAB — URIC ACID: Uric Acid, Serum: 3.9 mg/dL (ref 2.3–6.6)

## 2016-11-23 LAB — C-REACTIVE PROTEIN: CRP: 27 mg/dL — ABNORMAL HIGH (ref <1.0)

## 2016-11-23 LAB — CK: Total CK: 89 U/L (ref 38–234)

## 2016-11-23 LAB — SEDIMENTATION RATE: SED RATE: 77 mm/h — AB (ref 0–20)

## 2016-11-23 MED ORDER — TOBRAMYCIN-DEXAMETHASONE 0.3-0.1 % OP SUSP
1.0000 [drp] | Freq: Four times a day (QID) | OPHTHALMIC | Status: DC
  Administered 2016-11-24 (×2): 1 [drp] via OPHTHALMIC
  Filled 2016-11-23: qty 2.5

## 2016-11-23 MED ORDER — SODIUM CHLORIDE 0.9 % IV BOLUS (SEPSIS)
1000.0000 mL | Freq: Once | INTRAVENOUS | Status: AC
  Administered 2016-11-23: 1000 mL via INTRAVENOUS

## 2016-11-23 MED ORDER — HEPARIN SODIUM (PORCINE) 5000 UNIT/ML IJ SOLN
5000.0000 [IU] | Freq: Three times a day (TID) | INTRAMUSCULAR | Status: DC
  Administered 2016-11-23 – 2016-11-25 (×5): 5000 [IU] via SUBCUTANEOUS
  Filled 2016-11-23 (×5): qty 1

## 2016-11-23 MED ORDER — MORPHINE SULFATE (PF) 2 MG/ML IV SOLN
2.0000 mg | INTRAVENOUS | Status: DC | PRN
  Administered 2016-11-23 – 2016-11-24 (×4): 2 mg via INTRAVENOUS
  Filled 2016-11-23 (×4): qty 1

## 2016-11-23 MED ORDER — GADOBENATE DIMEGLUMINE 529 MG/ML IV SOLN
15.0000 mL | Freq: Once | INTRAVENOUS | Status: AC | PRN
  Administered 2016-11-23: 15 mL via INTRAVENOUS

## 2016-11-23 MED ORDER — HYDROMORPHONE HCL 1 MG/ML IJ SOLN
0.5000 mg | Freq: Once | INTRAMUSCULAR | Status: AC
  Administered 2016-11-23: 0.5 mg via INTRAVENOUS
  Filled 2016-11-23: qty 1

## 2016-11-23 MED ORDER — VANCOMYCIN HCL IN DEXTROSE 1-5 GM/200ML-% IV SOLN
1000.0000 mg | Freq: Three times a day (TID) | INTRAVENOUS | Status: DC
  Administered 2016-11-24 (×2): 1000 mg via INTRAVENOUS
  Filled 2016-11-23 (×4): qty 200

## 2016-11-23 MED ORDER — ONDANSETRON HCL 4 MG PO TABS: 4.0000 mg | ORAL_TABLET | Freq: Four times a day (QID) | ORAL | Status: DC | PRN

## 2016-11-23 MED ORDER — SODIUM CHLORIDE 0.9% FLUSH
3.0000 mL | Freq: Two times a day (BID) | INTRAVENOUS | Status: DC
  Administered 2016-11-23 – 2016-11-24 (×3): 3 mL via INTRAVENOUS

## 2016-11-23 MED ORDER — NICOTINE 14 MG/24HR TD PT24
14.0000 mg | MEDICATED_PATCH | Freq: Every day | TRANSDERMAL | Status: DC
  Administered 2016-11-24 – 2016-11-25 (×2): 14 mg via TRANSDERMAL
  Filled 2016-11-23 (×2): qty 1

## 2016-11-23 MED ORDER — ACETAMINOPHEN 650 MG RE SUPP: 650.0000 mg | Freq: Four times a day (QID) | RECTAL | Status: DC | PRN

## 2016-11-23 MED ORDER — VANCOMYCIN HCL IN DEXTROSE 1-5 GM/200ML-% IV SOLN: 1000.0000 mg | Freq: Once | INTRAVENOUS | Status: DC

## 2016-11-23 MED ORDER — DOCUSATE SODIUM 100 MG PO CAPS
100.0000 mg | ORAL_CAPSULE | Freq: Two times a day (BID) | ORAL | Status: DC
  Administered 2016-11-23 – 2016-11-25 (×4): 100 mg via ORAL
  Filled 2016-11-23 (×4): qty 1

## 2016-11-23 MED ORDER — CYCLOBENZAPRINE HCL 10 MG PO TABS: 10.0000 mg | ORAL_TABLET | Freq: Three times a day (TID) | ORAL | Status: DC | PRN

## 2016-11-23 MED ORDER — DIAZEPAM 5 MG PO TABS
5.0000 mg | ORAL_TABLET | Freq: Once | ORAL | Status: AC
  Administered 2016-11-23: 5 mg via ORAL
  Filled 2016-11-23: qty 1

## 2016-11-23 MED ORDER — ACETAMINOPHEN 325 MG PO TABS
650.0000 mg | ORAL_TABLET | Freq: Four times a day (QID) | ORAL | Status: DC | PRN
  Administered 2016-11-25: 650 mg via ORAL
  Filled 2016-11-23: qty 2

## 2016-11-23 MED ORDER — VANCOMYCIN HCL 10 G IV SOLR
1750.0000 mg | Freq: Once | INTRAVENOUS | Status: AC
  Administered 2016-11-23: 1750 mg via INTRAVENOUS
  Filled 2016-11-23: qty 1750

## 2016-11-23 MED ORDER — ONDANSETRON HCL 4 MG/2ML IJ SOLN: 4.0000 mg | Freq: Four times a day (QID) | INTRAMUSCULAR | Status: DC | PRN

## 2016-11-23 MED ORDER — SODIUM CHLORIDE 0.9 % IV SOLN
INTRAVENOUS | Status: DC
  Administered 2016-11-23: 20:00:00 via INTRAVENOUS

## 2016-11-23 MED ORDER — HYDROCODONE-ACETAMINOPHEN 5-325 MG PO TABS: 1.0000 | ORAL_TABLET | ORAL | Status: DC | PRN

## 2016-11-23 MED ORDER — PIPERACILLIN-TAZOBACTAM 3.375 G IVPB 30 MIN: 3.3750 g | Freq: Once | INTRAVENOUS | Status: DC

## 2016-11-23 MED ORDER — PIPERACILLIN-TAZOBACTAM 3.375 G IVPB
3.3750 g | Freq: Three times a day (TID) | INTRAVENOUS | Status: DC
  Administered 2016-11-23 – 2016-11-24 (×4): 3.375 g via INTRAVENOUS
  Filled 2016-11-23 (×4): qty 50

## 2016-11-23 NOTE — H&P (Addendum)
[facility] NAME: Regina Carpenter    MR#:  [id number]  DATE OF BIRTH:  1976/04/05  DATE OF ADMISSION:  11/23/2016  PRIMARY CARE PHYSICIAN: No PCP Per Patient   REQUESTING/REFERRING PHYSICIAN: Dr. Larae Grooms  CHIEF COMPLAINT:   Chief Complaint  Patient presents with  . Generalized Body Aches    HISTORY OF PRESENT ILLNESS:  Regina Carpenter  is a 41 y.o. female with No known past medical history presents to the hospital secondary to worsening neck pain going on for 5 days now. Patient denies any prior medical history and was not taking any prescription medications. 5 days ago she woke up one morning with significant pain in her neck that was also radiating to both her shoulders. She denies any paresthesias or muscle weakness of any of her extremities. Was also compounded with abdominal pain and she presented to the emergency room. CT of the abdomen at that time revealed mesenteric lymphadenopathy and neck workup revealed possible stiff neck and she was discharged on Flexeril and tramadol for pain. Her symptoms have not improved and she has been quite debilitated and unable to even get up from the bed due to significant worsening in her neck pain. She does have low-grade fevers and has been having some chills. Denies any trauma to the neck. Also her right eye is blood shot today and she denies any trauma. 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 Labs indicate worsening of her white count from last week. MRI of the C-spine indicates possible inflammatory or infectious myositis without any fluid collection from C2-C4 region. Patient is being admitted with sepsis secondary to an myositis of her paraspinal muscles.  PAST MEDICAL HISTORY:   Past Medical History:  Diagnosis Date  . Arthralgia of right knee   . Arthritis   . Cellulitis of left leg   . Lymphadenopathy, mesenteric   . Right hand pain   . Tobacco abuse     PAST SURGICAL  HISTORY:   Past Surgical History:  Procedure Laterality Date  . denies      SOCIAL HISTORY:   Social History  Substance Use Topics  . Smoking status: Current Every Day Smoker    Packs/day: 1.00    Types: Cigarettes  . Smokeless tobacco: Never Used  . Alcohol use No    FAMILY HISTORY:   Family History  Problem Relation Age of Onset  . Leukemia Maternal Grandfather   . Alcohol abuse Father     DRUG ALLERGIES:   Allergies  Allergen Reactions  . Ibuprofen Other (See Comments)    Pt states that she sweats profusely after taking ibuprofen    REVIEW OF SYSTEMS:   Review of Systems  Constitutional: Positive for malaise/fatigue. Negative for chills, fever and weight loss.  HENT: Negative for ear discharge, ear pain, nosebleeds and tinnitus.   Eyes: Negative for blurred vision, double vision and photophobia.  Respiratory: Negative for cough, hemoptysis, shortness of breath and wheezing.   Cardiovascular: Negative for chest pain, palpitations, orthopnea and leg swelling.  Gastrointestinal: Positive for abdominal pain. Negative for constipation, diarrhea, heartburn, melena, nausea and vomiting.  Genitourinary: Negative for dysuria, frequency, hematuria and urgency.  Musculoskeletal: Positive for back pain, myalgias and neck pain.  Skin: Negative for rash.  Neurological: Negative for dizziness, tingling, tremors, sensory change, speech change and focal weakness.  Endo/Heme/Allergies: Does not bruise/bleed easily.  Psychiatric/Behavioral: Negative for depression.    MEDICATIONS AT HOME:   Prior  to Admission medications   Medication Sig Start Date End Date Taking? Authorizing Provider  cyclobenzaprine (FLEXERIL) 10 MG tablet Take 1 tablet (10 mg total) by mouth 3 (three) times daily as needed. 11/19/16  Yes Sable Feil, PA-C  traMADol (ULTRAM) 50 MG tablet Take 1 tablet (50 mg total) by mouth every 6 (six) hours as needed. 11/19/16 11/19/17 Yes Sable Feil, PA-C       VITAL SIGNS:  Blood pressure 111/76, pulse (!) 103, temperature 99.9 F (37.7 C), temperature source Oral, resp. rate 14, height _0  (1.651 m), weight 81.6 kg (180 lb), last menstrual period 11/04/2016, SpO2 100 %.  PHYSICAL EXAMINATION:   Physical Exam  GENERAL:  41 y.o.-year-old patient lying in the bed, appears to be in distress secondary to neck pain.  EYES:  Left pupil is round, reactive to light and accommodation. Right conjunctiva is erythematous and photosensitive.  No scleral icterus. Extraocular muscles intact.  HEENT: Head atraumatic, normocephalic. Oropharynx and nasopharynx clear.  NECK:  Supple, stiff and tender, no jugular venous distention. No thyroid enlargement, no tenderness.  No visual rash on the neck or trauma LUNGS: Normal breath sounds bilaterally, no wheezing, rales,rhonchi or crepitation. No use of accessory muscles of respiration.  CARDIOVASCULAR: S1, S2 normal. No murmurs, rubs, or gallops.  ABDOMEN: Soft, nontender, nondistended. Bowel sounds present. No organomegaly or mass.  EXTREMITIES: No pedal edema, cyanosis, or clubbing.  NEUROLOGIC: Cranial nerves II through XII are intact. Muscle strength 5/5 in all extremities. Sensation intact. Gait not checked.  PSYCHIATRIC: The patient is alert and oriented x 3.  SKIN: No obvious rash, lesion, or ulcer.   LABORATORY PANEL:   CBC  Recent Labs Lab 11/23/16 1430  WBC 20.7*  HGB 11.6*  HCT 34.6*  PLT 346   ------------------------------------------------------------------------------------------------------------------  Chemistries   Recent Labs Lab 11/23/16 1430  NA 131*  K 3.8  CL 96*  CO2 25  GLUCOSE 97  BUN 5*  CREATININE 0.72  CALCIUM 9.1  AST 19  ALT 8*  ALKPHOS 62  BILITOT 0.6   ------------------------------------------------------------------------------------------------------------------  Cardiac Enzymes No results for input(s): TROPONINI in the last 168  hours. ------------------------------------------------------------------------------------------------------------------  RADIOLOGY:  Mr Cervical Spine W Or Wo Contrast  Result Date: 11/23/2016 CLINICAL DATA:  42 y/o F; neck pain with concern for mass or infection. EXAM: MRI CERVICAL SPINE WITHOUT AND WITH CONTRAST TECHNIQUE: Multiplanar and multiecho pulse sequences of the cervical spine, to include the craniocervical junction and cervicothoracic junction, were obtained without and with intravenous contrast. CONTRAST:  58m MULTIHANCE GADOBENATE DIMEGLUMINE 529 MG/ML IV SOLN COMPARISON:  None. FINDINGS: Alignment: Straightening of cervical lordosis with slight reversal at the C5-6 level. Vertebrae: No evidence of acute fracture or discitis. There is edema and enhancement within the right C5-6 facet (series 6, image 1 and series 11, image 17). Cord: No abnormal cord signal or cord enhancement. No epidural collection. Posterior Fossa, vertebral arteries, paraspinal tissues: Mild enhancement and edema within the posterior paraspinal muscles of the neck in the suboccipital region and deep to the nuchal ligament from C2-C4. No discrete fluid collection. Disc levels: C2-3: No significant disc displacement, foraminal narrowing, or canal stenosis. C3-4: No significant disc displacement, foraminal narrowing, or canal stenosis. C4-5: No significant disc displacement, foraminal narrowing, or canal stenosis. C5-6: Central disc protrusion with mild anterior cord impingement and flattening. Bilateral uncovertebral and facet hypertrophy with mild foraminal narrowing. No significant canal stenosis. C6-7: Minimal disc bulge. No significant foraminal narrowing or canal stenosis. C7-T1: No  significant disc displacement, foraminal narrowing, or canal stenosis. IMPRESSION: 1. Mild enhancement and edema within the posterior paraspinal muscles in the suboccipital region and deep to the nuchal ligament from C2 through C4. Findings  may represents traumatic, infectious, or inflammatory myositis. No discrete fluid collection is identified. 2. Mild edema and enhancement within the right C5-6 facet is probably degenerative. 3. No evidence for discitis, epidural collection, or mass. 4. Mild cervical spondylosis predominantly at the C5-6 level where a small central disc protrusion impinges the anterior cord with flattening. No high-grade canal stenosis or cord compression. Electronically Signed   By: Kristine Garbe M.D.   On: 11/23/2016 15:51    EKG:   Orders placed or performed during the hospital encounter of 11/23/16  . ED EKG 12-Lead  . ED EKG 12-Lead    IMPRESSION AND PLAN:   Regina Carpenter  is a 41 y.o. female with No known past medical history presents to the hospital secondary to worsening neck pain going on for 5 days now.  #1 Sepsis- from myositis of paraspinal muscles with significant cervical neck pain - Has had reactive arthritis episodes a few times in the past - admit, IV fluids, IV vanc and zosyn - ID and rheumatology consult - ANA, ESR, rheumatoid factor, Anti-CCP ab, uric acid levels, CPK, TSH, CRP ordered - pain meds and muscle relaxants ordered - hold off on steroids at this time- per rheumatology - check urine drug screen - neurosurgery was consulted from ER- did not recommend any testing or intervention at this time  #2 Hyponatremia- dehydration, IV fluids and monitor  #3 Conjunctivitis of right eye- no discharge noted, but painful and photo sensitive - order eye drops and monitor -? Follow up rheumatological work up as well  #4 DVT prophylaxis- SQ heparin  #5 Tobacco use disorder- nicotine patch, drug screen ordered   All the records are reviewed and case discussed with ED provider. Management plans discussed with the patient, family and they are in agreement.  CODE STATUS: Full Code  TOTAL TIME TAKING CARE OF THIS PATIENT: 50 minutes.    Gladstone Lighter M.D on 11/23/2016  at 5:23 PM  Between 7am to 6pm - Pager - [phone]  After 6pm go to [email or web address] - password [email or web address] [facility]  Office  [phone]  CC: Primary care physician; No PCP Per Patient

## 2016-11-23 NOTE — ED Triage Notes (Signed)
Pt presents to ER from home by EMS. Pt c/o generalized body pain and stiff neck X2 weeks. Pt was seen here last Thursday for same and d/c home. Pt ambulated with assistance by EMS to stretcher. EMS blood pressure 110/60, RA 100%. A&O x3

## 2016-11-23 NOTE — ED Provider Notes (Signed)
Signout from Dr. Mariea Clonts in this 41 year old female with a recent finding of abdominal lymphadenopathy concerning for lymphoma who is now presented with neck stiffness. Sinus followed the patient's MRI.  Physical Exam  BP 111/76   Pulse (!) 103   Temp 99.9 F (37.7 C) (Oral)   Resp 14   Ht 5\' 5"  (1.651 m)   Wt 180 lb (81.6 kg)   LMP 11/04/2016 Comment: neg preg  SpO2 100%   BMI 29.95 kg/m   ----------------------------------------- 5:08 PM on 11/23/2016 -----------------------------------------    Physical Exam He appears uncomfortable but mentating well. Despite pain medication as well as muscle relaxants the patient still is unable to range her neck. She is tender diffusely to the posterior of the neck. ED Course  Procedures  MDM MR Cervical Spine W or Wo Contrast (Final result)  Result time 11/23/16 15:51:08  Final result by Kristine Garbe, MD (11/23/16 15:51:08)           Narrative:   CLINICAL DATA: 41 y/o F; neck pain with concern for mass or infection.  EXAM: MRI CERVICAL SPINE WITHOUT AND WITH CONTRAST  TECHNIQUE: Multiplanar and multiecho pulse sequences of the cervical spine, to include the craniocervical junction and cervicothoracic junction, were obtained without and with intravenous contrast.  CONTRAST: 30mL MULTIHANCE GADOBENATE DIMEGLUMINE 529 MG/ML IV SOLN  COMPARISON: None.  FINDINGS: Alignment: Straightening of cervical lordosis with slight reversal at the C5-6 level.  Vertebrae: No evidence of acute fracture or discitis. There is edema and enhancement within the right C5-6 facet (series 6, image 1 and series 11, image 17).  Cord: No abnormal cord signal or cord enhancement. No epidural collection.  Posterior Fossa, vertebral arteries, paraspinal tissues: Mild enhancement and edema within the posterior paraspinal muscles of the neck in the suboccipital region and deep to the nuchal ligament from C2-C4. No discrete fluid  collection.  Disc levels:  C2-3: No significant disc displacement, foraminal narrowing, or canal stenosis.  C3-4: No significant disc displacement, foraminal narrowing, or canal stenosis.  C4-5: No significant disc displacement, foraminal narrowing, or canal stenosis.  C5-6: Central disc protrusion with mild anterior cord impingement and flattening. Bilateral uncovertebral and facet hypertrophy with mild foraminal narrowing. No significant canal stenosis.  C6-7: Minimal disc bulge. No significant foraminal narrowing or canal stenosis.  C7-T1: No significant disc displacement, foraminal narrowing, or canal stenosis.  IMPRESSION: 1. Mild enhancement and edema within the posterior paraspinal muscles in the suboccipital region and deep to the nuchal ligament from C2 through C4. Findings may represents traumatic, infectious, or inflammatory myositis. No discrete fluid collection is identified. 2. Mild edema and enhancement within the right C5-6 facet is probably degenerative. 3. No evidence for discitis, epidural collection, or mass. 4. Mild cervical spondylosis predominantly at the C5-6 level where a small central disc protrusion impinges the anterior cord with flattening. No high-grade canal stenosis or cord compression.   Electronically Signed By: Kristine Garbe M.D. On: 11/23/2016 15:51             Patient with likely myositis but unclear etiology. I discussed the case with Dr. Aris Lot of the neurosurgical service who does not recommend surgical intervention at this time. I also discussed case with Dr. Jefm Bryant of rheumatology who is suspicious for infectious cause given the constellation of symptoms and possible cancer. He does not recommend steroids at this time. Recommends a CK and further workup. I will be giving the patient broad-spectrum anabiotic. Sepsis order set was ordered and the patient was updated  about the imaging findings as well as the plan  and suspected diagnoses. She is understanding of this one to comply.  Signed out to Dr. Tressia Miners of the medicine service.       Orbie Pyo, MD 11/23/16 [phone]

## 2016-11-23 NOTE — ED Provider Notes (Signed)
[facility] Emergency Department Provider Note  ____________________________________________  Time seen: Approximately 2:16 PM  I have reviewed the triage vital signs and the nursing notes.   HISTORY  Chief Complaint Generalized Body Aches    HPI Regina Carpenter is a 41 y.o. female with a history of "slipped disc in my neck," presenting with neck pain and stiffness. The patient reports that for the past week, she has had neck pain with any movement. She denies any associated trauma, headache, fever or chills, nausea or vomiting. She has no numbness tingling or weakness. She was seen here last week on 2/16 for the same,and was felt to have a clinical workup most consistent with torticollis. She was discharged with tramadol and Flexeril but states that neither the sister helped. In addition, she is having joint pain in the bilateral ankles, knees, wrists, elbows and shoulders. No associated swelling or overlying erythema. No vaginal discharge.  Of note, the patient was admitted for abdominal pain with significant lymphadenopathy 2/12, with concern for possible lymphoma. The patient has not had her outpatient follow-up, nor her biopsy.  SH: Pos tobacco. Negative cocaine.   Past Medical History:  Diagnosis Date  . Arthralgia of right knee   . Arthritis   . Cellulitis of left leg   . Lymphadenopathy, mesenteric   . Right hand pain   . Tobacco abuse     Patient Active Problem List   Diagnosis Date Noted  . Abdominal pain 11/14/2016  . Lymphadenopathy, mesenteric   . Arthralgia of right knee 04/02/2015  . Cellulitis of left leg 03/21/2015  . Right hand pain 03/21/2015  . Tobacco use 03/21/2015    Past Surgical History:  Procedure Laterality Date  . denies      Current Outpatient Rx  . Order #: [id number] Class: Print  . Order #: [id number] Class: Historical Med  . Order #: [id number] Class: Print    Allergies Ibuprofen  Family History  Problem  Relation Age of Onset  . Leukemia Maternal Grandfather     Social History Social History  Substance Use Topics  . Smoking status: Current Every Day Smoker    Packs/day: 1.00    Types: Cigarettes  . Smokeless tobacco: Never Used  . Alcohol use No    Review of Systems Constitutional: No fever/chills.No lightheadedness or syncope. No general malaise. Eyes: No visual changes. Redness of the right eye with associated photophobia. No eye discharge. ENT: No sore throat. No congestion or rhinorrhea. Neck: Positives neck pain with stiffness. Cardiovascular: Denies chest pain. Denies palpitations. Respiratory: Denies shortness of breath.  No cough. Gastrointestinal: No abdominal pain.  No nausea, no vomiting.  No diarrhea.  No constipation. Genitourinary: Negative for dysuria. Musculoskeletal: Negative for back pain. Positive diffuse joint pain in the bilateral ankles, knees, wrists, elbows and shoulders Skin: Negative for rash. Neurological: Negative for headaches. No focal numbness, tingling or weakness.   10-point ROS otherwise negative.  ____________________________________________   PHYSICAL EXAM:  VITAL SIGNS: ED Triage Vitals  Enc Vitals Group     BP 11/23/16 1350 (!) 115/91     Pulse Rate 11/23/16 1352 (!) 106     Resp 11/23/16 1352 20     Temp 11/23/16 1352 99.5 F (37.5 C)     Temp Source 11/23/16 1352 Oral     SpO2 11/23/16 1352 100 %     Weight 11/23/16 1353 180 lb (81.6 kg)     Height 11/23/16 1353 5\' 5"  (1.651 m)     Head Circumference --  Peak Flow --      Pain Score 11/23/16 1354 10     Pain Loc --      Pain Edu? --      Excl. in [place]? --     Constitutional: The patient is alert and oriented and able to answer questions appropriately. She sits forward in the bed and keeps her neck immobilized. Eyes: Conjunctivae are normal.  EOMI. Right eye conjunctival erythema with associated photophobia but normal pupillary response. PERRLA. No scleral icterus. Head:  Atraumatic. Nose: No congestion/rhinnorhea. Mouth/Throat: Mucous membranes are moist.  Neck: No stridor.  Stiff neck with pain with movement in any direction. The patient does have diffuse tenderness to palpation in the entirety of the C-spine in the midline. No obvious step-offs or deformities.   Cardiovascular: Normal rate, regular rhythm. No murmurs, rubs or gallops.  Respiratory: Normal respiratory effort.  No accessory muscle use or retractions. Lungs CTAB.  No wheezes, rales or ronchi. Gastrointestinal: Soft, nontender and nondistended.  No guarding or rebound.  No peritoneal signs. Musculoskeletal: Full range of motion of the bilateral ankles, knees, hips, wrists, elbows, shoulders. The patient does describe pain with range of motion of all the joints except for the hips. She has no overlying effusions, erythema. Neurologic:  Alert and oriented 3. Face and smile are symmetric. Extra movements are intact. PERRLA. I've out of 5 bilateral grip strength, dorsiflexion and plantarflexion. Normal sensation to light touch in the face, upper extremities and lower extremities. Skin:  Skin is warm, dry and intact. No rash noted. Psychiatric: Depressed mood with flat affect.  ____________________________________________   LABS (all labs ordered are listed, but only abnormal results are displayed)  Labs Reviewed  CBC  BASIC METABOLIC PANEL  SEDIMENTATION RATE   ____________________________________________  EKG  Not indicated ____________________________________________  RADIOLOGY  No results found.  ____________________________________________   PROCEDURES  Procedure(s) performed: None  Procedures  Critical Care performed: No ____________________________________________   INITIAL IMPRESSION / ASSESSMENT AND PLAN / ED COURSE  Pertinent labs & imaging results that were available during my care of the patient were reviewed by me and considered in my medical decision making  (see chart for details).  41 y.o. female presenting with neck pain and stiffness that is not improving with tramadol and Flexeril. 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  Today, the patient has no neurologic deficit that would be concerning for spinal cord compression. Also consider epidural hematoma or mass, as well as abscess. Meningitis is much less likely given the length of time the patient has had the symptoms, lack of headache, lack of fever. A C-spine MRI has been ordered, the patient will be given symptomatic treatment at this time.  The patient will be signed out to Dr. Clearnce Hasten for further evaluation and treatment.  ____________________________________________  FINAL CLINICAL IMPRESSION(S) / ED DIAGNOSES  Final diagnoses:  Neck pain  Neck stiffness  Redness of right eye  Arthralgia, unspecified joint         NEW MEDICATIONS STARTED DURING THIS VISIT:  New Prescriptions   No medications on file      Eula Listen, MD 11/23/16 1428

## 2016-11-23 NOTE — ED Notes (Signed)
Patient transported to MRI 

## 2016-11-23 NOTE — Progress Notes (Signed)
ANTIBIOTIC CONSULT NOTE - INITIAL  Pharmacy Consult for Vancomycin/Zosyn dosing Indication: sepsis  Allergies  Allergen Reactions  . Ibuprofen Other (See Comments)    Pt states that she sweats profusely after taking ibuprofen    Patient Measurements: Height: 5\' 5"  (165.1 cm) Weight: 180 lb (81.6 kg) IBW/kg (Calculated) : 57 Adjusted Body Weight:   Vital Signs: Temp: 99.9 F (37.7 C) (02/20 1657) Temp Source: Oral (02/20 1657) BP: 111/76 (02/20 1600) Pulse Rate: 103 (02/20 1546) Intake/Output from previous day: No intake/output data recorded. Intake/Output from this shift: No intake/output data recorded.  Labs:  Recent Labs  11/23/16 1430  WBC 20.7*  HGB 11.6*  PLT 346  CREATININE 0.72   Estimated Creatinine Clearance: 97.6 mL/min (by C-G formula based on SCr of 0.72 mg/dL). No results for input(s): VANCOTROUGH, VANCOPEAK, VANCORANDOM, GENTTROUGH, GENTPEAK, GENTRANDOM, TOBRATROUGH, TOBRAPEAK, TOBRARND, AMIKACINPEAK, AMIKACINTROU, AMIKACIN in the last 72 hours.   Microbiology: Recent Results (from the past 720 hour(s))  Urine culture     Status: None   Collection Time: 11/14/16 11:22 AM  Result Value Ref Range Status   Specimen Description Urine  Final   Special Requests NONE  Final   Culture   Final    NO GROWTH Performed at [facility], [address], [place], [place] [address]    Report Status 11/16/2016 FINAL  Final    Medical History: Past Medical History:  Diagnosis Date  . Arthralgia of right knee   . Arthritis   . Cellulitis of left leg   . Lymphadenopathy, mesenteric   . Right hand pain   . Tobacco abuse     Medications:   Assessment: Patient is admitted for generalized body aches and lymphadenopathy. HR 100's WBC 20.7 - 16.0 Sed rate of 77  Goal of Therapy:  Vancomycin trough level 15-20 mcg/ml  Plan:  Will give vancomycin 1750 mg x 1 IV in ED (21 mg/kg) load followed by: Vancomycin 1g q8h per adjusted body weight (66  kg). Will draw a vanc trough 2/21 @ 1630 prior to 4th dose to ensure steady state Will initiate zosyn 3.375g q8h over 4 hours extended-interval infusion. TBW 81.6 kg IBW 57 kg AdjBW 66 kg Ke 0.0854 Half-life 8 hours Follow up culture results   Thank you for this consult.  Tobie Lords, PharmD, BCPS Clinical Pharmacist 11/23/2016

## 2016-11-24 ENCOUNTER — Inpatient Hospital Stay: Payer: Self-pay

## 2016-11-24 ENCOUNTER — Inpatient Hospital Stay
Admit: 2016-11-24 | Discharge: 2016-11-24 | Disposition: A | Discharge status: Home / Self Care | Payer: Self-pay | Attending: Internal Medicine | Admitting: Internal Medicine

## 2016-11-24 LAB — BASIC METABOLIC PANEL
Anion gap: 7 (ref 5–15)
CALCIUM: 8.6 mg/dL — AB (ref 8.9–10.3)
CHLORIDE: 104 mmol/L (ref 101–111)
CO2: 24 mmol/L (ref 22–32)
CREATININE: 0.63 mg/dL (ref 0.44–1.00)
GFR calc Af Amer: >60 mL/min (ref >60)
GFR calc non Af Amer: >60 mL/min (ref >60)
Glucose, Bld: 99 mg/dL (ref 65–99)
Potassium: 3.6 mmol/L (ref 3.5–5.1)
SODIUM: 135 mmol/L (ref 135–145)

## 2016-11-24 LAB — URINE CULTURE: Culture: NO GROWTH

## 2016-11-24 LAB — URIC ACID: URIC ACID, SERUM: 2.4 mg/dL (ref 2.3–6.6)

## 2016-11-24 LAB — CBC
HCT: 31.9 % — ABNORMAL LOW (ref 35.0–47.0)
Hemoglobin: 10.9 g/dL — ABNORMAL LOW (ref 12.0–16.0)
MCH: 28 pg (ref 26.0–34.0)
MCHC: 34 g/dL (ref 32.0–36.0)
MCV: 82.2 fL (ref 80.0–100.0)
PLATELETS: 356 10*3/uL (ref 150–440)
RBC: 3.88 MIL/uL (ref 3.80–5.20)
RDW: 14.2 % (ref 11.5–14.5)
WBC: 18.7 10*3/uL — ABNORMAL HIGH (ref 3.6–11.0)

## 2016-11-24 LAB — MRSA PCR SCREENING: MRSA by PCR: NEGATIVE

## 2016-11-24 LAB — RAPID HIV SCREEN (HIV 1/2 AB+AG)
HIV 1/2 ANTIBODIES: NONREACTIVE
HIV-1 P24 Antigen - HIV24: NONREACTIVE

## 2016-11-24 MED ORDER — PREDNISONE 10 MG PO TABS: 10.0000 mg | ORAL_TABLET | Freq: Every day | ORAL | Status: DC

## 2016-11-24 MED ORDER — OXYCODONE HCL 5 MG PO TABS
5.0000 mg | ORAL_TABLET | ORAL | Status: DC | PRN
  Administered 2016-11-24: 5 mg via ORAL
  Filled 2016-11-24: qty 1

## 2016-11-24 MED ORDER — PREDNISONE 10 MG PO TABS
50.0000 mg | ORAL_TABLET | Freq: Every day | ORAL | Status: DC
Start: 2016-11-25 — End: 2016-11-25
  Administered 2016-11-25: 08:00:00 50 mg via ORAL
  Filled 2016-11-24: qty 2

## 2016-11-24 MED ORDER — PREDNISONE 20 MG PO TABS
60.0000 mg | ORAL_TABLET | Freq: Once | ORAL | Status: AC
  Administered 2016-11-24: 60 mg via ORAL
  Filled 2016-11-24: qty 3

## 2016-11-24 MED ORDER — CIPROFLOXACIN HCL 0.3 % OP SOLN
1.0000 [drp] | OPHTHALMIC | Status: DC
  Administered 2016-11-24 – 2016-11-25 (×5): 1 [drp] via OPHTHALMIC
  Filled 2016-11-24: qty 2.5

## 2016-11-24 MED ORDER — POTASSIUM CHLORIDE IN NACL 20-0.9 MEQ/L-% IV SOLN
INTRAVENOUS | Status: DC
  Administered 2016-11-24 – 2016-11-25 (×2): via INTRAVENOUS
  Filled 2016-11-24 (×4): qty 1000

## 2016-11-24 NOTE — Consult Note (Signed)
[facility] Infectious Disease     Reason for Consult: Myositis, lymphadenopathy   Referring Physician: Seth Bake Date of Admission:  11/23/2016   Active Problems:   Myositis   HPI: Narelle Schoening is a 41 y.o. female  admitted initially February 11 with complaints of abdominal pain.  She has a history of recurrent arthritis and arthralgias.  On admission February 11 her white blood count was 17 and she had imaging done which showed mesenteric adenopathy and fat stranding.  She had CT of her chest which did not show any lymphadenopathy there.  She was seen by surgery as well as oncology.  Plan was to attempt either IR biopsy of the mesenteric lymph nodes or open biopsy.  Patient was discharged on oral ciprofloxacin and Flagyl. ESR at that time was 1.  She was readmitted February 20 with neck pain ongoing for 5 days.  She was also continuing to have abdominal pain.  She was also having low-grade fevers and chills.  Her thigh was also bloodshot and irritating.  On admission her white count remained elevated and she had an MRI of the C-spine that shows a possible inflammatory infectious myositis along the C2-C5 region.  Currently her white count 18.7 she is developing anemia down to 10.9.  Platelets are normal.  CRP is up to 27 and ESR is up from 1-77   Past Medical History:  Diagnosis Date  . Arthralgia of right knee   . Arthritis   . Cellulitis of left leg   . Lymphadenopathy, mesenteric   . Right hand pain   . Tobacco abuse    Past Surgical History:  Procedure Laterality Date  . denies     Social History  Substance Use Topics  . Smoking status: Current Every Day Smoker    Packs/day: 1.00    Types: Cigarettes  . Smokeless tobacco: Never Used  . Alcohol use No   Family History  Problem Relation Age of Onset  . Leukemia Maternal Grandfather   . Alcohol abuse Father     Allergies:  Allergies  Allergen Reactions  . Ibuprofen Other (See Comments)    Pt states that she  sweats profusely after taking ibuprofen    Current antibiotics: Antibiotics Given (last 72 hours)    Date/Time Action Medication Dose Rate   11/24/16 0026 Given   vancomycin (VANCOCIN) IVPB 1000 mg/200 mL premix 1,000 mg 200 mL/hr   11/24/16 0349 Given   vancomycin (VANCOCIN) IVPB 1000 mg/200 mL premix 1,000 mg 200 mL/hr      MEDICATIONS: . ciprofloxacin  1 drop Right Eye Q4H while awake  . docusate sodium  100 mg Oral BID  . heparin subcutaneous  5,000 Units Subcutaneous Q8H  . nicotine  14 mg Transdermal Daily  . piperacillin-tazobactam (ZOSYN)  IV  3.375 g Intravenous Q8H  . sodium chloride flush  3 mL Intravenous Q12H  . vancomycin  1,000 mg Intravenous Q8H    Review of Systems - 11 systems reviewed and negative per HPI   OBJECTIVE: Temp:  [98.9 F (37.2 C)-99.9 F (37.7 C)] 98.9 F (37.2 C) (02/21 0735) Pulse Rate:  [99-110] 99 (02/21 0735) Resp:  [14-24] 18 (02/21 0735) BP: (94-127)/(67-91) 109/73 (02/21 0735) SpO2:  [99 %-100 %] 100 % (02/21 0735) Weight:  [78.6 kg (173 lb 4.8 oz)-81.6 kg (180 lb)] 78.6 kg (173 lb 4.8 oz) (02/20 1957) Physical Exam  Constitutional:  oriented to person, place, and time. appears well-developed and well-nourished. No distress.  HENT: [place]/AT,  PERRLA, L eye with impressive conjunctivial hemorrohage  Mouth/Throat: Oropharynx is clear and dry . No oropharyngeal exudate.  Cardiovascular: Normal rate, regular rhythm and normal heart sounds. Exam reveals no gallop and no friction rub.  No murmur heard.  Pulmonary/Chest: Effort normal and breath sounds normal. No respiratory distress.  has no wheezes.  Neck pain with rom Abdominal: Soft. Bowel sounds are normal.  exhibits no distension. There is no tenderness.  Lymphadenopathy: no cervical adenopathy. No axillary adenopathy Neurological: alert and oriented to person, place, and time.  Skin: Skin is warm and dry. No rash noted. No erythema.  Ext L knee warm and with tenderness and pain with  ROM, L wrist tender and swollne Psychiatric: a normal mood and affect.  behavior is normal.    LABS: Results for orders placed or performed during the hospital encounter of 11/23/16 (from the past 48 hour(s))  CBC     Status: Abnormal   Collection Time: 11/23/16  2:30 PM  Result Value Ref Range   WBC 20.7 (H) 3.6 - 11.0 K/uL   RBC 4.19 3.80 - 5.20 MIL/uL   Hemoglobin 11.6 (L) 12.0 - 16.0 g/dL   HCT 34.6 (L) 35.0 - 47.0 %   MCV 82.5 80.0 - 100.0 fL   MCH 27.6 26.0 - 34.0 pg   MCHC 33.4 32.0 - 36.0 g/dL   RDW 14.2 11.5 - 14.5 %   Platelets 346 150 - 440 K/uL  Basic metabolic panel     Status: Abnormal   Collection Time: 11/23/16  2:30 PM  Result Value Ref Range   Sodium 131 (L) 135 - 145 mmol/L   Potassium 3.8 3.5 - 5.1 mmol/L   Chloride 96 (L) 101 - 111 mmol/L   CO2 25 22 - 32 mmol/L   Glucose, Bld 97 65 - 99 mg/dL   BUN 5 (L) 6 - 20 mg/dL   Creatinine, Ser 0.72 0.44 - 1.00 mg/dL   Calcium 9.1 8.9 - 10.3 mg/dL   GFR calc non Af Amer >60 >60 mL/min   GFR calc Af Amer >60 >60 mL/min    Comment: (NOTE) The eGFR has been calculated using the CKD EPI equation. This calculation has not been validated in all clinical situations. eGFR's persistently <60 mL/min signify possible Chronic Kidney Disease.    Anion gap 10 5 - 15  Sedimentation rate     Status: Abnormal   Collection Time: 11/23/16  2:30 PM  Result Value Ref Range   Sed Rate 77 (H) 0 - 20 mm/hr  CK     Status: None   Collection Time: 11/23/16  2:30 PM  Result Value Ref Range   Total CK 89 38 - 234 U/L  Hepatic function panel     Status: Abnormal   Collection Time: 11/23/16  2:30 PM  Result Value Ref Range   Total Protein 8.5 (H) 6.5 - 8.1 g/dL   Albumin 3.0 (L) 3.5 - 5.0 g/dL   AST 19 15 - 41 U/L   ALT 8 (L) 14 - 54 U/L   Alkaline Phosphatase 62 38 - 126 U/L   Total Bilirubin 0.6 0.3 - 1.2 mg/dL   Bilirubin, Direct <0.1 (L) 0.1 - 0.5 mg/dL   Indirect Bilirubin NOT CALCULATED 0.3 - 0.9 mg/dL  Blood Culture  (routine x 2)     Status: None (Preliminary result)   Collection Time: 11/23/16  4:55 PM  Result Value Ref Range   Specimen Description BLOOD LEFT AC    Special  Requests BOTTLES DRAWN AEROBIC AND ANAEROBIC BCAV    Culture NO GROWTH < 24 HOURS    Report Status PENDING   Blood Culture (routine x 2)     Status: None (Preliminary result)   Collection Time: 11/23/16  4:55 PM  Result Value Ref Range   Specimen Description BLOOD RIGHT AC    Special Requests BOTTLES DRAWN AEROBIC AND ANAEROBIC BCHV    Culture NO GROWTH < 24 HOURS    Report Status PENDING   Lactic acid, plasma     Status: None   Collection Time: 11/23/16  4:55 PM  Result Value Ref Range   Lactic Acid, Venous 1.3 0.5 - 1.9 mmol/L  Urinalysis, Complete w Microscopic     Status: Abnormal   Collection Time: 11/23/16  4:55 PM  Result Value Ref Range   Color, Urine YELLOW (A) YELLOW   APPearance CLEAR (A) CLEAR   Specific Gravity, Urine 1.011 1.005 - 1.030   pH 6.0 5.0 - 8.0   Glucose, UA NEGATIVE NEGATIVE mg/dL   Hgb urine dipstick NEGATIVE NEGATIVE   Bilirubin Urine NEGATIVE NEGATIVE   Ketones, ur 5 (A) NEGATIVE mg/dL   Protein, ur NEGATIVE NEGATIVE mg/dL   Nitrite NEGATIVE NEGATIVE   Leukocytes, UA NEGATIVE NEGATIVE   RBC / HPF 0-5 0 - 5 RBC/hpf   WBC, UA 0-5 0 - 5 WBC/hpf   Bacteria, UA RARE (A) NONE SEEN   Squamous Epithelial / LPF 0-5 (A) NONE SEEN  Urine Drug Screen, Qualitative ([facility] only)     Status: Abnormal   Collection Time: 11/23/16  4:55 PM  Result Value Ref Range   Tricyclic, Ur Screen POSITIVE (A) NONE DETECTED   Amphetamines, Ur Screen NONE DETECTED NONE DETECTED   MDMA (Ecstasy)Ur Screen NONE DETECTED NONE DETECTED   Cocaine Metabolite,Ur [place] NONE DETECTED NONE DETECTED   Opiate, Ur Screen NONE DETECTED NONE DETECTED   Phencyclidine (PCP) Ur S NONE DETECTED NONE DETECTED   Cannabinoid 50 Ng, Ur [place] POSITIVE (A) NONE DETECTED   Barbiturates, Ur Screen NONE DETECTED NONE DETECTED   Benzodiazepine, Ur  Scrn NONE DETECTED NONE DETECTED   Methadone Scn, Ur NONE DETECTED NONE DETECTED    Comment: (NOTE) 496  Tricyclics, urine               Cutoff 1000 ng/mL 200  Amphetamines, urine             Cutoff 1000 ng/mL 300  MDMA (Ecstasy), urine           Cutoff 500 ng/mL 400  Cocaine Metabolite, urine       Cutoff 300 ng/mL 500  Opiate, urine                   Cutoff 300 ng/mL 600  Phencyclidine (PCP), urine      Cutoff 25 ng/mL 700  Cannabinoid, urine              Cutoff 50 ng/mL 800  Barbiturates, urine             Cutoff 200 ng/mL 900  Benzodiazepine, urine           Cutoff 200 ng/mL 1000 Methadone, urine                Cutoff 300 ng/mL 1100 1200 The urine drug screen provides only a preliminary, unconfirmed 1300 analytical test result and should not be used for non-medical 1400 purposes. Clinical consideration and professional judgment should 1500 be applied to any  positive drug screen result due to possible 1600 interfering substances. A more specific alternate chemical method 1700 must be used in order to obtain a confirmed analytical result.  1800 Gas chromato graphy / mass spectrometry (GC/MS) is the preferred 1900 confirmatory method.   C-reactive protein     Status: Abnormal   Collection Time: 11/23/16  6:44 PM  Result Value Ref Range   CRP 27.0 (H) <1.0 mg/dL    Comment: Performed at [facility], [place] [address], [place], [place] [address]  TSH     Status: None   Collection Time: 11/23/16  6:44 PM  Result Value Ref Range   TSH 0.843 0.350 - 4.500 uIU/mL    Comment: Performed by a 3rd Generation assay with a functional sensitivity of <=0.01 uIU/mL.  Uric acid     Status: None   Collection Time: 11/23/16  6:44 PM  Result Value Ref Range   Uric Acid, Serum 3.9 2.3 - 6.6 mg/dL  Basic metabolic panel     Status: Abnormal   Collection Time: 11/24/16  5:52 AM  Result Value Ref Range   Sodium 135 135 - 145 mmol/L   Potassium 3.6 3.5 - 5.1 mmol/L   Chloride 104 101 - 111  mmol/L   CO2 24 22 - 32 mmol/L   Glucose, Bld 99 65 - 99 mg/dL   BUN <5 (L) 6 - 20 mg/dL   Creatinine, Ser 0.63 0.44 - 1.00 mg/dL   Calcium 8.6 (L) 8.9 - 10.3 mg/dL   GFR calc non Af Amer >60 >60 mL/min   GFR calc Af Amer >60 >60 mL/min    Comment: (NOTE) The eGFR has been calculated using the CKD EPI equation. This calculation has not been validated in all clinical situations. eGFR's persistently <60 mL/min signify possible Chronic Kidney Disease.    Anion gap 7 5 - 15  CBC     Status: Abnormal   Collection Time: 11/24/16  5:52 AM  Result Value Ref Range   WBC 18.7 (H) 3.6 - 11.0 K/uL   RBC 3.88 3.80 - 5.20 MIL/uL   Hemoglobin 10.9 (L) 12.0 - 16.0 g/dL   HCT 31.9 (L) 35.0 - 47.0 %   MCV 82.2 80.0 - 100.0 fL   MCH 28.0 26.0 - 34.0 pg   MCHC 34.0 32.0 - 36.0 g/dL   RDW 14.2 11.5 - 14.5 %   Platelets 356 150 - 440 K/uL  Uric acid     Status: None   Collection Time: 11/24/16  5:52 AM  Result Value Ref Range   Uric Acid, Serum 2.4 2.3 - 6.6 mg/dL   No components found for: ESR, C REACTIVE PROTEIN MICRO: Recent Results (from the past 720 hour(s))  Urine culture     Status: None   Collection Time: 11/14/16 11:22 AM  Result Value Ref Range Status   Specimen Description Urine  Final   Special Requests NONE  Final   Culture   Final    NO GROWTH Performed at [facility], [address] [place], [place] [address]    Report Status 11/16/2016 FINAL  Final  Blood Culture (routine x 2)     Status: None (Preliminary result)   Collection Time: 11/23/16  4:55 PM  Result Value Ref Range Status   Specimen Description BLOOD LEFT AC  Final   Special Requests BOTTLES DRAWN AEROBIC AND ANAEROBIC BCAV  Final   Culture NO GROWTH < 24 HOURS  Final   Report Status  PENDING  Incomplete  Blood Culture (routine x 2)     Status: None (Preliminary result)   Collection Time: 11/23/16  4:55 PM  Result Value Ref Range Status   Specimen Description BLOOD RIGHT AC  Final   Special Requests  BOTTLES DRAWN AEROBIC AND ANAEROBIC BCHV  Final   Culture NO GROWTH < 24 HOURS  Final   Report Status PENDING  Incomplete    IMAGING: Ct Angio Chest Pe W Or Wo Contrast  Result Date: 11/15/2016 CLINICAL DATA:  Leukocytosis.  Abdominal lymphadenopathy. EXAM: CT ANGIOGRAPHY CHEST WITH CONTRAST TECHNIQUE: Multidetector CT imaging of the chest was performed using the standard protocol during bolus administration of intravenous contrast. Multiplanar CT image reconstructions and MIPs were obtained to evaluate the vascular anatomy. CONTRAST:  75 mL Isovue 370 COMPARISON:  Abdomen pelvis CT on 11/14/2016 FINDINGS: Cardiovascular: Satisfactory opacification of pulmonary arteries noted, and no pulmonary emboli identified. No evidence of thoracic aortic dissection or aneurysm. Mediastinum/Nodes: No masses or pathologically enlarged lymph nodes identified. Lungs/Pleura: No pulmonary mass, infiltrate, or effusion. Upper abdomen: No acute findings. Musculoskeletal: No suspicious bone lesions or other significant abnormality identified. Review of the MIP images confirms the above findings. IMPRESSION: No evidence of thoracic lymphadenopathy or other acute findings. No evidence of pulmonary embolism. Electronically Signed   By: Earle Gell M.D.   On: 11/15/2016 10:32   Mr Cervical Spine W Or Wo Contrast  Result Date: 11/23/2016 CLINICAL DATA:  41 y/o F; neck pain with concern for mass or infection. EXAM: MRI CERVICAL SPINE WITHOUT AND WITH CONTRAST TECHNIQUE: Multiplanar and multiecho pulse sequences of the cervical spine, to include the craniocervical junction and cervicothoracic junction, were obtained without and with intravenous contrast. CONTRAST:  17m MULTIHANCE GADOBENATE DIMEGLUMINE 529 MG/ML IV SOLN COMPARISON:  None. FINDINGS: Alignment: Straightening of cervical lordosis with slight reversal at the C5-6 level. Vertebrae: No evidence of acute fracture or discitis. There is edema and enhancement within the  right C5-6 facet (series 6, image 1 and series 11, image 17). Cord: No abnormal cord signal or cord enhancement. No epidural collection. Posterior Fossa, vertebral arteries, paraspinal tissues: Mild enhancement and edema within the posterior paraspinal muscles of the neck in the suboccipital region and deep to the nuchal ligament from C2-C4. No discrete fluid collection. Disc levels: C2-3: No significant disc displacement, foraminal narrowing, or canal stenosis. C3-4: No significant disc displacement, foraminal narrowing, or canal stenosis. C4-5: No significant disc displacement, foraminal narrowing, or canal stenosis. C5-6: Central disc protrusion with mild anterior cord impingement and flattening. Bilateral uncovertebral and facet hypertrophy with mild foraminal narrowing. No significant canal stenosis. C6-7: Minimal disc bulge. No significant foraminal narrowing or canal stenosis. C7-T1: No significant disc displacement, foraminal narrowing, or canal stenosis. IMPRESSION: 1. Mild enhancement and edema within the posterior paraspinal muscles in the suboccipital region and deep to the nuchal ligament from C2 through C4. Findings may represents traumatic, infectious, or inflammatory myositis. No discrete fluid collection is identified. 2. Mild edema and enhancement within the right C5-6 facet is probably degenerative. 3. No evidence for discitis, epidural collection, or mass. 4. Mild cervical spondylosis predominantly at the C5-6 level where a small central disc protrusion impinges the anterior cord with flattening. No high-grade canal stenosis or cord compression. Electronically Signed   By: LKristine GarbeM.D.   On: 11/23/2016 15:51   Ct Abdomen Pelvis W Contrast  Result Date: 11/14/2016 CLINICAL DATA:  Abdominal pain and cramping for 3-4 days. Leukocytosis. EXAM: CT ABDOMEN AND PELVIS  WITH CONTRAST TECHNIQUE: Multidetector CT imaging of the abdomen and pelvis was performed using the standard  protocol following bolus administration of intravenous contrast. CONTRAST:  136m ISOVUE-300 IOPAMIDOL (ISOVUE-300) INJECTION 61% COMPARISON:  None. FINDINGS: Lower chest: Mild thickening of the distal esophagus as seen on series 2, image 10. The lung bases are otherwise within normal limits. Hepatobiliary: No focal liver abnormality is seen. No gallstones, gallbladder wall thickening, or biliary dilatation. Pancreas: The pancreatic duct is mildly prominent measuring up to 3 mm. No obstructing mass is seen. No evidence of pancreatitis. Spleen: Normal in size without focal abnormality. Adrenals/Urinary Tract: Probable cyst in the right kidney. The adrenal glands, kidneys, ureters, and bladder are otherwise normal. Stomach/Bowel: The stomach is normal. The terminal ileum is mildly prominent but there is no adjacent stranding. This is likely due to poor distention. Remainder of the small bowel is normal. The colon and appendix are normal Vascular/Lymphatic: The abdominal aorta and branching vessels are normal. Significant mesenteric adenopathy is identified. There is associated stranding associated with the abnormal nodes. The largest nodes are in the right lower quadrant measuring 13 mm. Reproductive: A crescent of air on axial image 72 is thought to be in the vagina of no acute significance. The uterus is retroflexed. Follicles are seen in both ovaries. A dominant follicle on the left measures 2.9 cm. Other: No other abnormalities. Musculoskeletal: No acute or significant osseous findings. IMPRESSION: 1. Mesenteric adenopathy and fat stranding. Sclerosing mesenteritis or lymphoma are the 2 top differential considerations. Metastatic disease and infectious etiologies are considered less likely. Recommend clinical correlation and attention on follow-up. 2. Mild thickening of the distal esophagus. An upper GI or direct visualization could better evaluate if clinically warranted. Electronically Signed   By: DDorise BullionIII M.D   On: 11/14/2016 14:05    Assessment:   STaya Ashbaughis a 41y.o. female with a several year history of recurrent joint pain, [facility] She also has mesenteric adenopathy and neck pain with MRI showing some inflammation in posterior paraspinal muscles. She has had negative HIV testing, Hepatitis testing, as well as testing for parvovirus infection in the past.  I do not think there is an infectious etiology of this presentation and would suggest she follow up closely with rheumatology  Recommendations Check RPR Can stop abx Thank you very much for allowing me to participate in the care of this patient. Please call with questions.   DCheral Marker FOla Spurr MD

## 2016-11-24 NOTE — Care Management (Signed)
Patient admitted with Sepsis- from myositis .  Patient self pay.  Patient was provided with application to [facility] and Medication Management previous admission this month. RNCM following for discharge planning needs.

## 2016-11-24 NOTE — Progress Notes (Addendum)
[facility] NAME: Regina Carpenter    MR#:  [id number]  DATE OF BIRTH:  [birth year]  SUBJECTIVE:  CHIEF COMPLAINT:   Chief Complaint  Patient presents with  . Generalized Body Aches  Patient is a 41 year old African American female with medical history significant for history of arthritis in the past, arthritis, cellulitis, mesenteric lymphadenopathy, has pain, tobacco abuse, who presents to the hospital with complaints of generalized arthralgias. She complains of significant pain in the neck, right shoulder, left wrist, left knee, on arrival to emergency room, she was noted to be tachycardic, somewhat hypoxic, tachypneic. D of neck revealed edema in the spinal muscles close to occipital region, cervical spondylosis. Patient was also noted to be hypoxic. No chest x-ray was performed in the emergency room. Patient complains of significant discomfort in multiple joints, requests pain medications intermittently. She is on broad-spectrum antibiotics due to concerns of infection. Blood cultures 2 were negative, urine culture is pending. Patient has subfebrile temperature to 99.9. White blood cell count remains high. CPR was high at 27. Lactic acid level was normal. CK level was normal. The sedimentation rate is elevated at 77. TSH was normal, as well as hemoglobin A1c at 5.4  Review of Systems  Constitutional: Positive for chills. Negative for fever and weight loss.  HENT: Negative for congestion.   Eyes: Negative for blurred vision and double vision.  Respiratory: Negative for cough, sputum production, shortness of breath and wheezing.   Cardiovascular: Negative for chest pain, palpitations, orthopnea, leg swelling and PND.  Gastrointestinal: Negative for abdominal pain, blood in stool, constipation, diarrhea, nausea and vomiting.  Genitourinary: Negative for dysuria, frequency, hematuria and urgency.  Musculoskeletal: Positive for falls,  joint pain, myalgias and neck pain.  Neurological: Negative for dizziness, tremors, focal weakness and headaches.  Endo/Heme/Allergies: Does not bruise/bleed easily.  Psychiatric/Behavioral: Negative for depression. The patient does not have insomnia.     VITAL SIGNS: Blood pressure 109/73, pulse 99, temperature 98.9 F (37.2 C), temperature source Oral, resp. rate 18, height 5\' 5"  (1.651 m), weight 78.6 kg (173 lb 4.8 oz), last menstrual period 11/04/2016, SpO2 100 %.  PHYSICAL EXAMINATION:   GENERAL:  41 y.o.-year-old patient lying in the bed in moderate distress due to multiple joint pains.  EYES: Pupils equal, round, reactive to light and accommodation. No scleral icterus. Extraocular muscles intact. Conjunctival injection on the right, some blurring of the vision, according to the patient, on the right whitish coating on the tongue, no aphthous lesions.  HEENT: Head atraumatic, normocephalic. Oropharynx and nasopharynx clear.  NECK:  Supple, no jugular venous distention. No thyroid enlargement, no tenderness.  LUNGS: Normal breath sounds bilaterally, no wheezing, rales,rhonchi or crepitation. No use of accessory muscles of respiration.  CARDIOVASCULAR: S1, S2 normal. No murmurs, rubs, or gallops.  ABDOMEN: Soft, nontender, nondistended. Bowel sounds present. No organomegaly or mass.  EXTREMITIES: No pedal edema, cyanosis, or clubbing.  painful palpation of multiple joints including left knee, right shoulder, neck, no significant joint effusions were notedP NEUROLOGIC: Cranial nerves II through XII are intact. Muscle strength 4/5 in all extremities, Restricted range of motion in multiple joints . Sensation intact. Gait not checked.  PSYCHIATRIC: The patient is alert and oriented x 3.  SKIN: No obvious rash, lesion, or ulcer.   ORDERS/RESULTS REVIEWED:   CBC  Recent Labs Lab 11/23/16 1430 11/24/16 0552  WBC 20.7* 18.7*  HGB 11.6* 10.9*  HCT 34.6* 31.9*  PLT 346 356  MCV 82.5  82.2  MCH 27.6 28.0  MCHC 33.4 34.0  RDW 14.2 14.2   ------------------------------------------------------------------------------------------------------------------  Chemistries   Recent Labs Lab 11/23/16 1430 11/24/16 0552  NA 131* 135  K 3.8 3.6  CL 96* 104  CO2 25 24  GLUCOSE 97 99  BUN 5* <5*  CREATININE 0.72 0.63  CALCIUM 9.1 8.6*  AST 19  --   ALT 8*  --   ALKPHOS 62  --   BILITOT 0.6  --    ------------------------------------------------------------------------------------------------------------------ estimated creatinine clearance is 95.8 mL/min (by C-G formula based on SCr of 0.63 mg/dL). ------------------------------------------------------------------------------------------------------------------  Recent Labs  11/23/16 1844  TSH 0.843    Cardiac Enzymes No results for input(s): CKMB, TROPONINI, MYOGLOBIN in the last 168 hours.  Invalid input(s): CK ------------------------------------------------------------------------------------------------------------------ Invalid input(s): POCBNP ---------------------------------------------------------------------------------------------------------------  RADIOLOGY: Mr Cervical Spine W Or Wo Contrast  Result Date: 11/23/2016 CLINICAL DATA:  41 y/o F; neck pain with concern for mass or infection. EXAM: MRI CERVICAL SPINE WITHOUT AND WITH CONTRAST TECHNIQUE: Multiplanar and multiecho pulse sequences of the cervical spine, to include the craniocervical junction and cervicothoracic junction, were obtained without and with intravenous contrast. CONTRAST:  40mL MULTIHANCE GADOBENATE DIMEGLUMINE 529 MG/ML IV SOLN COMPARISON:  None. FINDINGS: Alignment: Straightening of cervical lordosis with slight reversal at the C5-6 level. Vertebrae: No evidence of acute fracture or discitis. There is edema and enhancement within the right C5-6 facet (series 6, image 1 and series 11, image 17). Cord: No abnormal cord signal or cord  enhancement. No epidural collection. Posterior Fossa, vertebral arteries, paraspinal tissues: Mild enhancement and edema within the posterior paraspinal muscles of the neck in the suboccipital region and deep to the nuchal ligament from C2-C4. No discrete fluid collection. Disc levels: C2-3: No significant disc displacement, foraminal narrowing, or canal stenosis. C3-4: No significant disc displacement, foraminal narrowing, or canal stenosis. C4-5: No significant disc displacement, foraminal narrowing, or canal stenosis. C5-6: Central disc protrusion with mild anterior cord impingement and flattening. Bilateral uncovertebral and facet hypertrophy with mild foraminal narrowing. No significant canal stenosis. C6-7: Minimal disc bulge. No significant foraminal narrowing or canal stenosis. C7-T1: No significant disc displacement, foraminal narrowing, or canal stenosis. IMPRESSION: 1. Mild enhancement and edema within the posterior paraspinal muscles in the suboccipital region and deep to the nuchal ligament from C2 through C4. Findings may represents traumatic, infectious, or inflammatory myositis. No discrete fluid collection is identified. 2. Mild edema and enhancement within the right C5-6 facet is probably degenerative. 3. No evidence for discitis, epidural collection, or mass. 4. Mild cervical spondylosis predominantly at the C5-6 level where a small central disc protrusion impinges the anterior cord with flattening. No high-grade canal stenosis or cord compression. Electronically Signed   By: Kristine Garbe M.D.   On: 11/23/2016 15:51    EKG:  Orders placed or performed during the hospital encounter of 11/23/16  . ED EKG 12-Lead  . ED EKG 12-Lead    ASSESSMENT AND PLAN:  Active Problems:   Myositis #1. Polyarthralgias in cervical spine, knees, shoulders, wrists, conjunctivitis, concerning for inflammatory arthropathy, get a rheumatologist involved for recommendations, get uric acid level,  continue pain medications, unable to use nonsteroidal anti-inflammatory medications due to allergy. Serologic testing for Lyme disease, get echocardiogram to rule out endocarditis.  #2. Hyponatremia, resolved with IV fluids   #3. Hypokalemia, resolved #4. Leukocytosis, unlikely infectious, blood cultures are negative so far, no improvement with antibiotic therapy #5. Generalized weakness, get physical therapist involved for recommendations 6.  Right eye conjunctivitis vs uveitis, initiate ciprofloxacin drops, follow clinically Management plans discussed with the patient, family and they are in agreement.   DRUG ALLERGIES:  Allergies  Allergen Reactions  . Ibuprofen Other (See Comments)    Pt states that she sweats profusely after taking ibuprofen    CODE STATUS:     Code Status Orders        Start     Ordered   11/23/16 1955  Full code  Continuous     11/23/16 1954    Code Status History    Date Active Date Inactive Code Status Order ID Comments User Context   11/14/2016  4:32 PM 11/15/2016  8:36 PM Full Code [id number]  Dustin Flock, MD Inpatient      TOTAL TIME TAKING CARE OF THIS PATIENT: 40 minutes.    Theodoro Grist M.D on 11/24/2016 at 12:22 PM  Between 7am to 6pm - Pager - [phone]  After 6pm go to [email or web address] - password [email or web address] [facility]  [facility]  Office  [phone]  CC: Primary care physician; No PCP Per Patient

## 2016-11-24 NOTE — Consult Note (Signed)
Reason for Consult: Neck pain. Arthritis.  Referring Physician: Hospitalist. Dr. Waldon Reining   HPI: 41 year old African-American female. Worse works as a  Secretary/administrator. Complicated story About 3 years ago she started having episodic joint pain. Would have migrating pain in wrists knees and ankles. Occasionally fingers or toes. He had multiple visits to the emergency room and [facility]. One episode of swelling across the nose. Episodes of swelling in the ears. Hospitalization in 2016. Inflammatory arthritis. Serologies negative. Had arthrocentesis with right knee swelling at 7000 white cells Crystal negative culture negative. Sedimentation rate 93. Leukocytosis.  Since then she admission for abdominal pain. CT scan showed mesenteric adenopathy. Question panniculitis R sclerosing mesenteric adenitis. Since then she's had ER visits for chest wall pain, thoracic spine pain, swollen red ankles, left wrist and index finger finger and foot pain, flank pain, And May 2017 she had right eye erythema. Was seen in the emergency room and then by elements. Was told she had inflammation. Was given cortisone steroid drops. Since then she has had ER evaluation for thoracic spine pain.  On 11/14/2016 she was admitted for abdominal pain. CT scan showed mesenteric lymphadenopathy larger than prior notes. 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 She was offered laparoscopic biopsy but declined. She was treated with antibiotics. She went to the [facility] ER the next day with no change in plan. In the last few days she's had significant pain in the neck. She was seen emergency room given Flexeril and tramadol. She came back in with swollen joints pain in the left knee of the left hand and significant pain in the neck. MRI showed some paraspinous edema but also some facet edema. She says she has difficulty with some nonsteroidals and is allergic to ibuprofen Prior labs have shown leukocytosis. No thrombocytosis. She's had  elevated sedimentation rate and CRP during numerous evaluations over the last 3 years. She's had elevated pro time and elevated total protein with low albumin but normal transaminases. She has been afebrile.  PMH: Inflammatory arthritis  SURGICAL HISTORY: Tubal  Family History: Negative for connective tissue disease  Social History: Positive cigarettes. Positive cannabis.  Allergies:  Allergies  Allergen Reactions  . Ibuprofen Other (See Comments)    Pt states that she sweats profusely after taking ibuprofen    Medications:  Scheduled: . ciprofloxacin  1 drop Right Eye Q4H while awake  . docusate sodium  100 mg Oral BID  . heparin subcutaneous  5,000 Units Subcutaneous Q8H  . nicotine  14 mg Transdermal Daily  . sodium chloride flush  3 mL Intravenous Q12H        ROS: No skin rash. No psoriasis. No oral ulcers. No shortness of breath. Positive chest wall pain. No reflux. No diarrhea. No bloody diarrhea. Toes occasionally turn blue when they become inflamed but not with the cold.   PHYSICAL EXAM: Blood pressure 109/73, pulse 99, temperature 98.9 F (37.2 C), temperature source Oral, resp. rate 18, height '5\' 5"'  (1.651 m), weight 78.6 kg (173 lb 4.8 oz), last menstrual period 11/04/2016, SpO2 100 %. Ill-appearing female. Obvious inflamed right eye. Doesn't want to move her neck. Alert. No definite skin rash. No psoriatic patches elbows or ears or scalp. Oropharynx is clear. Clear chest. No murmur. No visceromegaly. No significant edema Musculoskeletal: Decreased range of motion of her neck in all modalities. Both shoulders moved well. Right olecranon bursa is tender but not swollen. Right hand is not swollen. The left wrist is is swollen and erythematous particularly along the  thumb extensors. The left third finger swollen MCP PIP decreased grip. Hips move well. Large left knee effusion. Right knee without synovitis. Patrick maneuver negative on the right. Achilles insertion and  MTPs are negative  Assessment: Seronegative inflammatory arthritis. Now 3 years in duration. 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 Inflammatory eye disease suspect. Rule out uveitis Chronically elevated sedimentation rate and CRP related to the above Neck pain with. Facet and paraspinous edema probably related to the above Mesenteric adenopathy. Could be related to inflammatory bowel disease. Does have abnormal terminal ileum as well as abnormal distal esophagus Cannot completely rule out liver disease with elevated pro time elevated proteins and low albumin Leukocytosis probably related to the above  Recommendations: Ophthalmology consult. Rule out uveitis  GI opinion regarding her mesenteric adenopathy abnormal terminal ileum and abnormal distal esophagus. Rule out inflammatory bowel disease. May be related to her inflammatory disease  Please draw HLA-B27, serum immunoelectrophoresis, IgG4 levels  May begin prednisone 60 mg in divided doses. Would not taper for now. Follow sedimentation rate and CRP  If left knee does not improve then I will aspirate and inject   Regina Carpenter 11/24/2016, 2:17 PM

## 2016-11-24 NOTE — Evaluation (Signed)
Physical Therapy Evaluation Patient Details Name: Regina Carpenter MRN: [id number] DOB: August 02, 1976 Today's Date: 11/24/2016   History of Present Illness  Pt is a 41 y.o. female presenting to hospital with neck pain x5 days (with radiation to B shoulders), pain in L wrist, and pain in L knee.  Pt admitted with sepsis secondary to myositis of paraspinal muscles with significant cervical neck pain, hyponatremia, conjunctivitis R eye (painful and photosensitive).  Clinical Impression  Prior to hospital admission, pt was independent and working in housekeeping at a hotel.  Pt lives with her father (who is not able to help per pt report) in 1 level home with steps to enter.  Currently pt is min assist with bed mobility and CGA with transfers and taking a couple steps commode to bed with UE support (pt declined further mobility during session d/t pain).  Anticipate with improved pain control, pt will be able to discharge home (pt may benefit from L platform RW to decrease pain with mobility but nursing reported that pt did walk to bathroom earlier today using IV pole so will need to further assess mobility at next session to determine appropriate DME needs).  Pt would benefit from skilled PT to address noted impairments and functional limitations.  Recommend pt discharge to home with HHPT when medically appropriate.    Follow Up Recommendations Home health PT    Equipment Recommendations   (TBD)    Recommendations for Other Services OT consult     Precautions / Restrictions Precautions Precautions: Fall Restrictions Weight Bearing Restrictions: No      Mobility  Bed Mobility Overal bed mobility: Needs Assistance Bed Mobility: Sit to Supine       Sit to supine: Min assist;HOB elevated   General bed mobility comments: assist for L LE sit to supine d/t L LE pain; assist to lay back down (trunk and neck/head) onto bed comfortably  Transfers Overall transfer level: Needs  assistance Equipment used: None Transfers: Sit to/from [facility] Sit to Stand: Min guard Stand pivot transfers: Min guard       General transfer comment: pt antalgic on L LE but able to take a couple steps from commode to bed with UE support  Ambulation/Gait             General Gait Details: pt layed back down in bed after returning from toileting d/t pain and declined to get back out of bed again  Stairs            Wheelchair Mobility    Modified Rankin (Stroke Patients Only)       Balance Overall balance assessment: Needs assistance Sitting-balance support: No upper extremity supported;Feet supported Sitting balance-Leahy Scale: Good Sitting balance - Comments: static sitting   Standing balance support: Bilateral upper extremity supported Standing balance-Leahy Scale: Fair Standing balance comment: static standing                             Pertinent Vitals/Pain Pain Assessment: 0-10 Pain Score: 8  (10/10 with movement) Pain Location: neck, L wrist, L knee, L ankle Pain Descriptors / Indicators: Sore;Constant;Grimacing;Guarding;Tender Pain Intervention(s): Limited activity within patient's tolerance;Repositioned     Home Living Family/patient expects to be discharged to:: Private residence Living Arrangements: Parent (Pt's father)   Type of Home: House Home Access: Stairs to enter   [employer] of Steps: 2-3 steps no rail from back; 5 steps with L railing from front Home Layout:  One level Home Equipment: None      Prior Function Level of Independence: Independent         Comments: 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  Denies any falls.  Pt reports her father is not able to assist her.     Hand Dominance        Extremity/Trunk Assessment   Upper Extremity Assessment Upper Extremity Assessment:  (R UE WFL; L shoulder and elbow WFL; pain in L wrist with wrist movements)    Lower Extremity  Assessment Lower Extremity Assessment: LLE deficits/detail (R LE WFL) LLE Deficits / Details: hip flexion at least 3/5 AROM; L knee AROM (in sidelying gravity eliminated) pt able to reach 90 degrees of knee flexion (limited d/t significant pain); L DF to neutral (limited d/t ankle pain) LLE: Unable to fully assess due to pain       Communication   Communication: No difficulties  Cognition Arousal/Alertness: Awake/alert Behavior During Therapy: WFL for tasks assessed/performed Overall Cognitive Status: Within Functional Limits for tasks assessed                      General Comments General comments (skin integrity, edema, etc.): Pt on commode upon PT arrival.  Nursing cleared pt for participation in physical therapy.  Pt agreeable to PT session.    Exercises     Assessment/Plan    PT Assessment Patient needs continued PT services  PT Problem List Decreased strength;Decreased range of motion;Decreased activity tolerance;Decreased balance;Decreased mobility;Decreased knowledge of use of DME;Pain       PT Treatment Interventions DME instruction;Gait training;Functional mobility training;Therapeutic activities;Therapeutic exercise;Balance training;Stair training;Neuromuscular re-education;Patient/family education    PT Goals (Current goals can be found in the Care Plan section)  Acute Rehab PT Goals Patient Stated Goal: to have less pain PT Goal Formulation: With patient Time For Goal Achievement: 12/08/16 Potential to Achieve Goals: Fair    Frequency Min 2X/week   Barriers to discharge   Possibly level of assist    Co-evaluation               End of Session Equipment Utilized During Treatment: Gait belt Activity Tolerance: Patient limited by pain Patient left: in bed;with call bell/phone within reach;with bed alarm set Nurse Communication: Mobility status;Patient requests pain meds;Precautions PT Visit Diagnosis: Difficulty in walking, not elsewhere  classified (R26.2);Pain Pain - Right/Left: Left Pain - part of body: Hand;Knee;Ankle and joints of foot         Time: 1555-1615 PT Time Calculation (min) (ACUTE ONLY): 20 min   Charges:   PT Evaluation $PT Eval Low Complexity: 1 Procedure     PT G CodesLeitha Bleak, PT 11/24/16, 5:09 PM [phone]

## 2016-11-25 DIAGNOSIS — D72829 Elevated white blood cell count, unspecified: Secondary | ICD-10-CM

## 2016-11-25 DIAGNOSIS — R531 Weakness: Secondary | ICD-10-CM

## 2016-11-25 DIAGNOSIS — R52 Pain, unspecified: Secondary | ICD-10-CM

## 2016-11-25 DIAGNOSIS — E871 Hypo-osmolality and hyponatremia: Secondary | ICD-10-CM

## 2016-11-25 DIAGNOSIS — E876 Hypokalemia: Secondary | ICD-10-CM

## 2016-11-25 DIAGNOSIS — H15101 Unspecified episcleritis, right eye: Secondary | ICD-10-CM

## 2016-11-25 DIAGNOSIS — H109 Unspecified conjunctivitis: Secondary | ICD-10-CM

## 2016-11-25 DIAGNOSIS — H15001 Unspecified scleritis, right eye: Secondary | ICD-10-CM

## 2016-11-25 LAB — ECHOCARDIOGRAM COMPLETE
Height: 65 in
Weight: 2772.8 oz

## 2016-11-25 LAB — RHEUMATOID FACTOR: Rhuematoid fact SerPl-aCnc: 19.2 IU/mL — ABNORMAL HIGH (ref 0.0–13.9)

## 2016-11-25 LAB — ANA COMPREHENSIVE PANEL
Anti JO-1: <0.2 AI (ref 0.0–0.9)
Chromatin Ab SerPl-aCnc: <0.2 AI (ref 0.0–0.9)
DS DNA AB: 1 [IU]/mL (ref 0–9)
ENA SM Ab Ser-aCnc: <0.2 AI (ref 0.0–0.9)
SSA (Ro) (ENA) Antibody, IgG: 1.3 AI — ABNORMAL HIGH (ref 0.0–0.9)
SSB (La) (ENA) Antibody, IgG: <0.2 AI (ref 0.0–0.9)
Scleroderma (Scl-70) (ENA) Antibody, IgG: <0.2 AI (ref 0.0–0.9)

## 2016-11-25 LAB — IGG 4: IgG, Subclass 4: 38 mg/dL (ref 2–96)

## 2016-11-25 LAB — RPR: RPR Ser Ql: NONREACTIVE

## 2016-11-25 LAB — CYCLIC CITRUL PEPTIDE ANTIBODY, IGG/IGA: CCP Antibodies IgG/IgA: 7 units (ref 0–19)

## 2016-11-25 LAB — B. BURGDORFI ANTIBODIES: B burgdorferi Ab IgG+IgM: <0.91 {ISR} (ref 0.00–0.90)

## 2016-11-25 MED ORDER — NICOTINE 14 MG/24HR TD PT24: 14.0000 mg | MEDICATED_PATCH | Freq: Every day | TRANSDERMAL | 0 refills | Status: DC

## 2016-11-25 MED ORDER — PREDNISOLONE ACETATE 1 % OP SUSP: 1.0000 [drp] | Freq: Every day | OPHTHALMIC | 0 refills | Status: DC

## 2016-11-25 MED ORDER — PREDNISONE 20 MG PO TABS: 60.0000 mg | ORAL_TABLET | Freq: Every day | ORAL | 5 refills | Status: DC

## 2016-11-25 MED ORDER — PREDNISOLONE ACETATE 1 % OP SUSP
1.0000 [drp] | Freq: Every day | OPHTHALMIC | Status: DC
  Filled 2016-11-25: qty 1

## 2016-11-25 MED ORDER — CIPROFLOXACIN HCL 0.3 % OP SOLN: 1.0000 [drp] | OPHTHALMIC | 0 refills | Status: DC

## 2016-11-25 MED ORDER — HYDROCODONE-ACETAMINOPHEN 5-325 MG PO TABS: 1.0000 | ORAL_TABLET | ORAL | 0 refills | Status: DC | PRN

## 2016-11-25 NOTE — Consult Note (Addendum)
Reason for Consult: red painful right eye Referring Physician: Dr. Lorel Monaco is an 41 y.o. female.  Chief complaint: Red painful right eye HPI: Patient is a 41 year old AAF with history of arthritis (per patient has been told it is rheumatoid arthritis) who presented to [facility] with generalized body pain, particularly in the neck.  Additionally over the few days prior to admission she developed a red right eye that was light sensitive and had a deep boring pain that at times woke her from sleep in that right eye.  She denies eye trauma, or any recent viral iillness.  She has had a similar episode in the past for which she was given topical steroid drops.  It subsequently resolved.  Her left eye is uninvolved and unchanged from baseline.    Past Medical History:  Diagnosis Date  . Arthralgia of right knee   . Arthritis   . Cellulitis of left leg   . Lymphadenopathy, mesenteric   . Right hand pain   . Tobacco abuse     Review of Systems  Constitutional: Positive for malaise/fatigue.  HENT: Negative.   Eyes: Positive for blurred vision, photophobia, pain and redness.  Musculoskeletal: Positive for back pain, joint pain and neck pain.  Skin: Negative.   Neurological: Positive for headaches.  Endo/Heme/Allergies: Negative.   Psychiatric/Behavioral: Negative.     Past Surgical History:  Procedure Laterality Date  . denies      Family History  Problem Relation Age of Onset  . Leukemia Maternal Grandfather   . Alcohol abuse Father     Social History:  reports that she has been smoking Cigarettes.  She has been smoking about 1.00 pack per day. She has never used smokeless tobacco. She reports that she does not drink alcohol or use drugs.  Allergies:  Allergies  Allergen Reactions  . Ibuprofen Other (See Comments)    Pt states that she sweats profusely after taking ibuprofen    Medications: I have reviewed the patient's current medications.  Results for orders  placed or performed during the hospital encounter of 11/23/16 (from the past 48 hour(s))  CBC     Status: Abnormal   Collection Time: 11/23/16  2:30 PM  Result Value Ref Range   WBC 20.7 (H) 3.6 - 11.0 K/uL   RBC 4.19 3.80 - 5.20 MIL/uL   Hemoglobin 11.6 (L) 12.0 - 16.0 g/dL   HCT 34.6 (L) 35.0 - 47.0 %   MCV 82.5 80.0 - 100.0 fL   MCH 27.6 26.0 - 34.0 pg   MCHC 33.4 32.0 - 36.0 g/dL   RDW 14.2 11.5 - 14.5 %   Platelets 346 150 - 440 K/uL  Basic metabolic panel     Status: Abnormal   Collection Time: 11/23/16  2:30 PM  Result Value Ref Range   Sodium 131 (L) 135 - 145 mmol/L   Potassium 3.8 3.5 - 5.1 mmol/L   Chloride 96 (L) 101 - 111 mmol/L   CO2 25 22 - 32 mmol/L   Glucose, Bld 97 65 - 99 mg/dL   BUN 5 (L) 6 - 20 mg/dL   Creatinine, Ser 0.72 0.44 - 1.00 mg/dL   Calcium 9.1 8.9 - 10.3 mg/dL   GFR calc non Af Amer >60 >60 mL/min   GFR calc Af Amer >60 >60 mL/min    Comment: (NOTE) The eGFR has been calculated using the CKD EPI equation. This calculation has not been validated in all clinical situations. eGFR's persistently <  60 mL/min signify possible Chronic Kidney Disease.    Anion gap 10 5 - 15  Sedimentation rate     Status: Abnormal   Collection Time: 11/23/16  2:30 PM  Result Value Ref Range   Sed Rate 77 (H) 0 - 20 mm/hr  CK     Status: None   Collection Time: 11/23/16  2:30 PM  Result Value Ref Range   Total CK 89 38 - 234 U/L  Hepatic function panel     Status: Abnormal   Collection Time: 11/23/16  2:30 PM  Result Value Ref Range   Total Protein 8.5 (H) 6.5 - 8.1 g/dL   Albumin 3.0 (L) 3.5 - 5.0 g/dL   AST 19 15 - 41 U/L   ALT 8 (L) 14 - 54 U/L   Alkaline Phosphatase 62 38 - 126 U/L   Total Bilirubin 0.6 0.3 - 1.2 mg/dL   Bilirubin, Direct <0.1 (L) 0.1 - 0.5 mg/dL   Indirect Bilirubin NOT CALCULATED 0.3 - 0.9 mg/dL  Blood Culture (routine x 2)     Status: None (Preliminary result)   Collection Time: 11/23/16  4:55 PM  Result Value Ref Range   Specimen  Description BLOOD LEFT AC    Special Requests BOTTLES DRAWN AEROBIC AND ANAEROBIC BCAV    Culture NO GROWTH 2 DAYS    Report Status PENDING   Blood Culture (routine x 2)     Status: None (Preliminary result)   Collection Time: 11/23/16  4:55 PM  Result Value Ref Range   Specimen Description BLOOD RIGHT AC    Special Requests BOTTLES DRAWN AEROBIC AND ANAEROBIC BCHV    Culture NO GROWTH 2 DAYS    Report Status PENDING   Lactic acid, plasma     Status: None   Collection Time: 11/23/16  4:55 PM  Result Value Ref Range   Lactic Acid, Venous 1.3 0.5 - 1.9 mmol/L  Urinalysis, Complete w Microscopic     Status: Abnormal   Collection Time: 11/23/16  4:55 PM  Result Value Ref Range   Color, Urine YELLOW (A) YELLOW   APPearance CLEAR (A) CLEAR   Specific Gravity, Urine 1.011 1.005 - 1.030   pH 6.0 5.0 - 8.0   Glucose, UA NEGATIVE NEGATIVE mg/dL   Hgb urine dipstick NEGATIVE NEGATIVE   Bilirubin Urine NEGATIVE NEGATIVE   Ketones, ur 5 (A) NEGATIVE mg/dL   Protein, ur NEGATIVE NEGATIVE mg/dL   Nitrite NEGATIVE NEGATIVE   Leukocytes, UA NEGATIVE NEGATIVE   RBC / HPF 0-5 0 - 5 RBC/hpf   WBC, UA 0-5 0 - 5 WBC/hpf   Bacteria, UA RARE (A) NONE SEEN   Squamous Epithelial / LPF 0-5 (A) NONE SEEN  Urine culture     Status: None   Collection Time: 11/23/16  4:55 PM  Result Value Ref Range   Specimen Description URINE, RANDOM    Special Requests NONE    Culture      NO GROWTH Performed at [facility], [address], [place], [place] [address]    Report Status 11/24/2016 FINAL   Urine Drug Screen, Qualitative ([facility] only)     Status: Abnormal   Collection Time: 11/23/16  4:55 PM  Result Value Ref Range   Tricyclic, Ur Screen POSITIVE (A) NONE DETECTED   Amphetamines, Ur Screen NONE DETECTED NONE DETECTED   MDMA (Ecstasy)Ur Screen NONE DETECTED NONE DETECTED   Cocaine Metabolite,Ur [place] NONE DETECTED NONE DETECTED   Opiate, Ur Screen NONE DETECTED  NONE DETECTED   Phencyclidine (PCP)  Ur S NONE DETECTED NONE DETECTED   Cannabinoid 50 Ng, Ur [place] POSITIVE (A) NONE DETECTED   Barbiturates, Ur Screen NONE DETECTED NONE DETECTED   Benzodiazepine, Ur Scrn NONE DETECTED NONE DETECTED   Methadone Scn, Ur NONE DETECTED NONE DETECTED    Comment: (NOTE) 132  Tricyclics, urine               Cutoff 1000 ng/mL 200  Amphetamines, urine             Cutoff 1000 ng/mL 300  MDMA (Ecstasy), urine           Cutoff 500 ng/mL 400  Cocaine Metabolite, urine       Cutoff 300 ng/mL 500  Opiate, urine                   Cutoff 300 ng/mL 600  Phencyclidine (PCP), urine      Cutoff 25 ng/mL 700  Cannabinoid, urine              Cutoff 50 ng/mL 800  Barbiturates, urine             Cutoff 200 ng/mL 900  Benzodiazepine, urine           Cutoff 200 ng/mL 1000 Methadone, urine                Cutoff 300 ng/mL 1100 1200 The urine drug screen provides only a preliminary, unconfirmed 1300 analytical test result and should not be used for non-medical 1400 purposes. Clinical consideration and professional judgment should 1500 be applied to any positive drug screen result due to possible 1600 interfering substances. A more specific alternate chemical method 1700 must be used in order to obtain a confirmed analytical result.  1800 Gas chromato graphy / mass spectrometry (GC/MS) is the preferred 1900 confirmatory method.   C-reactive protein     Status: Abnormal   Collection Time: 11/23/16  6:44 PM  Result Value Ref Range   CRP 27.0 (H) <1.0 mg/dL    Comment: Performed at [facility], [place] [address] [place], [place] [address]  ANA Comprehensive Panel     Status: Abnormal   Collection Time: 11/23/16  6:44 PM  Result Value Ref Range   ds DNA Ab 1 0 - 9 IU/mL    Comment: (NOTE)                                   Negative      <5                                   Equivocal  5 - 9                                   Positive      >9    Ribonucleic Protein <0.2 0.0 - 0.9 AI   ENA SM Ab Ser-aCnc <0.2  0.0 - 0.9 AI   Scleroderma (Scl-70) (ENA) Antibody, IgG <0.2 0.0 - 0.9 AI   SSA (Ro) (ENA) Antibody, IgG 1.3 (H) 0.0 - 0.9 AI   SSB (La) (ENA) Antibody, IgG <0.2 0.0 - 0.9 AI   Chromatin Ab SerPl-aCnc <0.2 0.0 - 0.9 AI   Anti JO-1 <  0.2 0.0 - 0.9 AI   Centromere Ab Screen <0.2 0.0 - 0.9 AI   See below: Comment     Comment: (NOTE) Autoantibody                       Disease Association ------------------------------------------------------------                        Condition                  Frequency ---------------------   ------------------------   --------- Antinuclear Antibody,    SLE, mixed connective Direct (ANA-D)           tissue diseases ---------------------   ------------------------   --------- dsDNA                    SLE                        40 - 60% ---------------------   ------------------------   --------- Chromatin                Drug induced SLE                90%                         SLE                        48 - 97% ---------------------   ------------------------   --------- SSA (Ro)                 SLE                        25 - 35%                         Sjogren's Syndrome         40 - 70%                         Neonatal Lupus                 100% ---------------------   ------------------------   --------- SSB (La)                 SLE                              10%                         Sjogren's Syndrome              30% ---------------------   -----------------------    --------- Sm (anti-Smith)          SLE                        15 - 30% ---------------------   -----------------------    --------- RNP                      Mixed Connective Tissue  Disease                         95% (U1 nRNP,                SLE                        30 - 50% anti-ribonucleoprotein)  Polymyositis and/or                         Dermatomyositis                 20% ---------------------   ------------------------   --------- Scl-70  (antiDNA          Scleroderma (diffuse)      20 - 35% topoisomerase)           [place]rest                           13% ---------------------   ------------------------   --------- Jo-1                     Polymyositis and/or                         Dermatomyositis            20 - 40% ---------------------   ------------------------   --------- Centromere B             Scleroderma -  Crest                         variant                         80% Performed At: [facility] [place], [place] [id number] Lindon Romp MD [id number]   Rheumatoid factor     Status: Abnormal   Collection Time: 11/23/16  6:44 PM  Result Value Ref Range   Rhuematoid fact SerPl-aCnc 19.2 (H) 0.0 - 13.9 IU/mL    Comment: (NOTE) Performed At: [facility] [place], [place] [id number] Lindon Romp MD [id number]   TSH     Status: None   Collection Time: 11/23/16  6:44 PM  Result Value Ref Range   TSH 0.843 0.350 - 4.500 uIU/mL    Comment: Performed by a 3rd Generation assay with a functional sensitivity of <=0.01 uIU/mL.  Uric acid     Status: None   Collection Time: 11/23/16  6:44 PM  Result Value Ref Range   Uric Acid, Serum 3.9 2.3 - 6.6 mg/dL  Basic metabolic panel     Status: Abnormal   Collection Time: 11/24/16  5:52 AM  Result Value Ref Range   Sodium 135 135 - 145 mmol/L   Potassium 3.6 3.5 - 5.1 mmol/L   Chloride 104 101 - 111 mmol/L   CO2 24 22 - 32 mmol/L   Glucose, Bld 99 65 - 99 mg/dL   BUN <5 (L) 6 - 20 mg/dL   Creatinine, Ser 0.63 0.44 - 1.00 mg/dL   Calcium 8.6 (L) 8.9 - 10.3 mg/dL   GFR calc non Af Amer >60 >60 mL/min   GFR calc Af Amer >60 >60 mL/min    Comment: (NOTE) The eGFR has been calculated using the  CKD EPI equation. This calculation has not been validated in all clinical situations. eGFR's persistently <60 mL/min signify possible Chronic Kidney Disease.    Anion gap 7 5 - 15  CBC     Status: Abnormal   Collection  Time: 11/24/16  5:52 AM  Result Value Ref Range   WBC 18.7 (H) 3.6 - 11.0 K/uL   RBC 3.88 3.80 - 5.20 MIL/uL   Hemoglobin 10.9 (L) 12.0 - 16.0 g/dL   HCT 31.9 (L) 35.0 - 47.0 %   MCV 82.2 80.0 - 100.0 fL   MCH 28.0 26.0 - 34.0 pg   MCHC 34.0 32.0 - 36.0 g/dL   RDW 14.2 11.5 - 14.5 %   Platelets 356 150 - 440 K/uL  Uric acid     Status: None   Collection Time: 11/24/16  5:52 AM  Result Value Ref Range   Uric Acid, Serum 2.4 2.3 - 6.6 mg/dL  RPR     Status: None   Collection Time: 11/24/16  5:52 AM  Result Value Ref Range   RPR Ser Ql Non Reactive Non Reactive    Comment: (NOTE) Performed At: [facility] [place], [place] [id number] Lindon Romp MD WH:[id number]   Rapid HIV screen (HIV 1/2 Ab+Ag) ([place] Only)     Status: None   Collection Time: 11/24/16  5:52 AM  Result Value Ref Range   HIV-1 P24 Antigen - HIV24 NON REACTIVE NON REACTIVE   HIV 1/2 Antibodies NON REACTIVE NON REACTIVE   Interpretation (HIV Ag Ab)      A non reactive test result means that HIV 1 or HIV 2 antibodies and HIV 1 p24 antigen were not detected in the specimen.  MRSA PCR Screening     Status: None   Collection Time: 11/24/16  6:25 PM  Result Value Ref Range   MRSA by PCR NEGATIVE NEGATIVE    Comment:        The GeneXpert MRSA Assay (FDA approved for NASAL specimens only), is one component of a comprehensive MRSA colonization surveillance program. It is not intended to diagnose MRSA infection nor to guide or monitor treatment for MRSA infections.      Blood pressure 108/74, pulse 81, temperature 97.9 F (36.6 C), temperature source Oral, resp. rate 16, height '5\' 5"'  (1.651 m), weight 78.6 kg (173 lb 4.8 oz), last menstrual period 11/04/2016, SpO2 100 %.  Mental status: Alert and Oriented x 4  Visual Acuity:  20/200 OD (strongly photophobic OD)  20/30 near [place]  Pupils:  Equally round/ reactive to light.  No Afferent defect.  Motility:  Full/ orthophoric  Visual  Fields:  Full to confrontation  IOP:  Soft to palpation OU  External/ Lids/ Lashes:  Normal  Anterior Segment:  Conjunctiva:  Diffuse injection OD with deep violaceous hue particularly in nasal corner of OD, exquisitely tender to palpation in that area. No sign of scleral thinning.  Normal conjunctiva OS.  Cornea:  Normal  OU  Anterior Chamber: No visible cell OU but exam significantly limited given bedside exam with handheld slit lamp  Lens:   Normal OU  Posterior Segment: undilated exam  Discs:   Normal c/d ratio, no pallor, no edema OU  Macula:  Normal  Vessels/ Periphery: Grossly flat and attached    Assessment/Plan: 1. 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  Pt is currently on 66m prednisone PO and this should help  with the scleritis.  If patient is discharged with no plan to continue prednisone PO, I recommend having her take indomethacin 25m PO TID [facility] (no need to start indomethacin while on oral prednisone).  Please feel free to contact me should the patient's condition change or if any questions about my plan arise.  VDelynn FlavinPSouthwest Washington Medical Center - Memorial Campus2/22/2018, 1:46 PM

## 2016-11-25 NOTE — Care Management (Signed)
PT has assessed patient and recommended home health PT.  Patient is uninsured and will not qualify for home health services.  RNCM to continue to follow.  Will provide information on [facility] if appropriate at discharge.

## 2016-11-25 NOTE — Discharge Summary (Signed)
[facility] NAME: Regina Carpenter    MR#:  [id number]  DATE OF BIRTH:  Dec 16, 1975  DATE OF ADMISSION:  11/23/2016 ADMITTING PHYSICIAN: Gladstone Lighter, MD  DATE OF DISCHARGE: No discharge date for patient encounter.  PRIMARY CARE PHYSICIAN: No PCP Per Patient     ADMISSION DIAGNOSIS:  Neck pain [M54.2] Neck stiffness [M43.6] Redness of right eye [H57.8] Sepsis, due to unspecified organism ([facility]) [A41.9] Myositis, unspecified myositis type, unspecified site [M60.9] Arthralgia, unspecified joint [M25.50]  DISCHARGE DIAGNOSIS:  Active Problems:   Inflammatory polyarthritis (HCC)   Hyponatremia   Hypokalemia   Leukocytosis   Generalized weakness   Generalized pain   Conjunctivitis   SECONDARY DIAGNOSIS:   Past Medical History:  Diagnosis Date  . Arthralgia of right knee   . Arthritis   . Cellulitis of left leg   . Lymphadenopathy, mesenteric   . Right hand pain   . Tobacco abuse     .pro HOSPITAL COURSE:   Patient is a 41 year old African American female with medical history significant for history of arthritis in the past, cellulitis, mesenteric lymphadenopathy, tobacco abuse, who presents to the hospital with complaints of generalized arthralgias, Joint swelling, neck pain.. She complains of significant pain in the neck, right shoulder, left wrist, left knee, on arrival to emergency room, she was noted to be tachycardic, somewhat hypoxic, tachypneic. CT of neck revealed edema in paraspinal muscles close to occipital region, cervical spondylosis. Patient was also noted to be hypoxic. Chest x-ray showed atelectasis versus pneumonia, patient was asymptomatic except for mild hypoxemia. Patient complained of significant pain and swelling in multiple joints. 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 She was initiated on steroids orally and her condition improved. Since patient had some elevation of white blood cell count, mild temperature elevation, ID consultation was requested, Blood cultures 2 were negative, urine culture was negative, patient was seen by Dr. Ola Spurr, who recommended to discontinue antibiotic therapy. Other studies: CPR was high at 27. Lactic acid level was normal. CK level was normal. The sedimentation rate is elevated at 77. TSH was normal, as well as hemoglobin A1c at 5.4. The patient was seen by ophthalmologist, who recommended prednisolone eyedrops and follow up with eye care specialist is within 1 week after discharge. Discussion by problem: #1. 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 Serologic testing for Lyme disease was negative, echocardiogram was negative for endocarditis.  #2. Hyponatremia, resolved with IV fluids   #3. Hypokalemia, resolved #4. Leukocytosis, unlikely infectious, blood cultures were negative , ID recommended to gaze antibiotic therapy #5. Generalized weakness, patient is to continue activity as tolerated, improved clinically, follow-up with rheumatologist for further recommendations 6. Right eye conjunctivitis vs uveitis, initiated  prednisolone eye drops, follow up with ophthalmologist as outpatient #7. 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 Mesenteric adenopathy, follow-up with rheumatologist and make decisions about biopsy as outpatient if  needed DISCHARGE CONDITIONS:   Stable  CONSULTS OBTAINED:  Treatment Team:  Emmaline Kluver., MD  Leonel Ramsay, MD Ronnell Freshwater, MD  DRUG ALLERGIES:   Allergies  Allergen Reactions  . Ibuprofen Other (See Comments)    Pt states that she sweats profusely after taking ibuprofen    DISCHARGE MEDICATIONS:   Current Discharge Medication List    START taking these medications   Details  ciprofloxacin (CILOXAN) 0.3 % ophthalmic solution Place 1 drop into the right eye every 4 (four) hours while awake. Administer 1 drop, every 2 hours, while awake, for 2 days. Then 1 drop, every 4 hours, while awake, for the next 5 days. Qty: 5 mL, Refills: 0    HYDROcodone-acetaminophen (NORCO/VICODIN) 5-325 MG tablet Take 1-2 tablets by mouth every 4 (four) hours as needed for moderate pain. Qty: 30 tablet, Refills: 0    nicotine (NICODERM CQ - DOSED IN MG/24 HOURS) 14 mg/24hr patch Place 1 patch (14 mg total) onto the skin daily. Qty: 28 patch, Refills: 0    predniSONE (DELTASONE) 20 MG tablet Take 3 tablets (60 mg total) by mouth daily with breakfast. Qty: 90 tablet, Refills: 5      CONTINUE these medications which have NOT CHANGED   Details  cyclobenzaprine (FLEXERIL) 10 MG tablet Take 1 tablet (10 mg total) by mouth 3 (three) times daily as needed. Qty: 15 tablet, Refills: 0      STOP taking these medications     traMADol (ULTRAM) 50 MG tablet          DISCHARGE INSTRUCTIONS:    The patient is to follow-up with rheumatologist, ophthalmologist as outpatient  If you experience worsening of your admission symptoms, develop shortness of breath, life threatening emergency, suicidal or homicidal thoughts you must seek medical attention immediately by calling 911 or calling your MD immediately  if symptoms less severe.  You Must read complete instructions/literature along with all the possible adverse reactions/side effects for all the Medicines you take and  that have been prescribed to you. Take any new Medicines after you have completely understood and accept all the possible adverse reactions/side effects.   Please note  You were cared for by a hospitalist during your hospital stay. If you have any questions about your discharge medications or the care you received while you were in the hospital after you are discharged, you can call the unit and asked to speak with the hospitalist on call if the hospitalist that took care of you is not available. Once you are discharged, your primary care physician will handle any further medical issues. Please note that NO REFILLS for any discharge medications will be authorized once you are discharged, as it is imperative that you return to your primary care physician (or establish a relationship with a primary care physician if you do not have one) for your aftercare needs so that they can reassess your need for medications and monitor your lab values.    Today   CHIEF COMPLAINT:   Chief Complaint  Patient presents with  . Generalized Body Aches    HISTORY OF PRESENT ILLNESS:  Regina Carpenter  is a 41 y.o. female with a known history of arthritis in the past, cellulitis, mesenteric lymphadenopathy, tobacco abuse, who presents to the hospital with complaints of generalized arthralgias, Joint swelling, neck pain.. She complains of significant pain in the neck, right shoulder, left wrist, left knee, on arrival to emergency room, she was noted to be tachycardic, somewhat hypoxic, tachypneic. CT of neck revealed edema in paraspinal muscles close to occipital region, cervical spondylosis. Patient was  also noted to be hypoxic. Chest x-ray showed atelectasis versus pneumonia, patient was asymptomatic except for mild hypoxemia. Patient complained of significant pain and swelling in multiple joints. 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 She was initiated on steroids orally and her condition improved. Since patient had some elevation of white blood cell count, mild temperature elevation, ID consultation was requested, Blood cultures 2 were negative, urine culture was negative, patient was seen by Dr. Ola Spurr, who recommended to discontinue antibiotic therapy. Other studies: CPR was high at 27. Lactic acid level was normal. CK level was normal. The sedimentation rate is elevated at 77. TSH was normal, as well as hemoglobin A1c at 5.4. The patient was seen by ophthalmologist, who recommended prednisolone eyedrops and follow up with eye care specialist is within 1 week after discharge. Discussion by problem: #1. 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 Serologic testing for Lyme disease was negative, echocardiogram was negative for endocarditis.  #2. Hyponatremia, resolved with IV fluids   #3. Hypokalemia, resolved #4. Leukocytosis, unlikely infectious, blood cultures were negative , ID recommended to gaze antibiotic therapy #5. Generalized weakness, patient is to continue activity as tolerated, improved clinically, follow-up with rheumatologist for further recommendations 6. Right eye conjunctivitis vs uveitis, initiated  prednisolone eye drops, follow up with ophthalmologist as outpatient #7. 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 Mesenteric adenopathy, follow-up with  rheumatologist and make decisions about biopsy as outpatient if needed   VITAL SIGNS:  Blood pressure 108/74, pulse 81, temperature 97.9 F (36.6 C), temperature source Oral, resp. rate 16, height 5\' 5"  (1.651 m), weight 78.6 kg (173 lb 4.8 oz), last menstrual period 11/04/2016, SpO2 100 %.  I/O:   Intake/Output Summary (Last 24 hours) at 11/25/16 1348 Last data filed at 11/25/16 1343  Gross per 24 hour  Intake             1763 ml  Output             3150 ml  Net            -1387 ml    PHYSICAL EXAMINATION:  GENERAL:  41 y.o.-year-old patient lying in the bed with no acute distress.  EYES: Pupils equal, round, reactive to light and accommodation. No scleral icterus. Extraocular muscles intact.  HEENT: Head atraumatic, normocephalic. Oropharynx and nasopharynx clear.  NECK:  Supple, no jugular venous distention. No thyroid enlargement, no tenderness.  LUNGS: Normal breath sounds bilaterally, no wheezing, rales,rhonchi or crepitation. No use of accessory muscles of respiration.  CARDIOVASCULAR: S1, S2 normal. No murmurs, rubs, or gallops.  ABDOMEN: Soft, non-tender, non-distended. Bowel sounds present. No organomegaly or mass.  EXTREMITIES: No pedal edema, cyanosis, or clubbing.  NEUROLOGIC: Cranial nerves II through XII are intact. Muscle strength 5/5 in all extremities. Sensation intact. Gait not checked.  PSYCHIATRIC: The patient is alert and oriented x 3.  SKIN: No obvious rash, lesion, or ulcer.   DATA REVIEW:   CBC  Recent Labs Lab 11/24/16 0552  WBC 18.7*  HGB 10.9*  HCT 31.9*  PLT 356    Chemistries   Recent Labs Lab 11/23/16 1430 11/24/16 0552  NA 131* 135  K 3.8 3.6  CL 96* 104  CO2 25 24  GLUCOSE 97 99  BUN 5* <5*  CREATININE 0.72 0.63  CALCIUM 9.1 8.6*  AST 19  --   ALT 8*  --   ALKPHOS 62  --   BILITOT 0.6  --     Cardiac Enzymes No results for input(s): TROPONINI in the last 168 hours.  Microbiology Results  Results for orders placed or  performed during the hospital encounter of 11/23/16  Blood Culture (routine x 2)     Status: None (Preliminary result)   Collection Time: 11/23/16  4:55 PM  Result Value Ref Range Status   Specimen Description BLOOD LEFT AC  Final   Special Requests BOTTLES DRAWN AEROBIC AND ANAEROBIC BCAV  Final   Culture NO GROWTH 2 DAYS  Final   Report Status PENDING  Incomplete  Blood Culture (routine x 2)     Status: None (Preliminary result)   Collection Time: 11/23/16  4:55 PM  Result Value Ref Range Status   Specimen Description BLOOD RIGHT AC  Final   Special Requests BOTTLES DRAWN AEROBIC AND ANAEROBIC [place]  Final   Culture NO GROWTH 2 DAYS  Final   Report Status PENDING  Incomplete  Urine culture     Status: None   Collection Time: 11/23/16  4:55 PM  Result Value Ref Range Status   Specimen Description URINE, RANDOM  Final   Special Requests NONE  Final   Culture   Final    NO GROWTH Performed at [facility], [place] [address] [place], [place] [address]    Report Status 11/24/2016 FINAL  Final  MRSA PCR Screening     Status: None   Collection Time: 11/24/16  6:25 PM  Result Value Ref Range Status   MRSA by PCR NEGATIVE NEGATIVE Final    Comment:        The GeneXpert MRSA Assay (FDA approved for NASAL specimens only), is one component of a comprehensive MRSA colonization surveillance program. It is not intended to diagnose MRSA infection nor to guide or monitor treatment for MRSA infections.     RADIOLOGY:  Dg Chest 2 View  Result Date: 11/24/2016 CLINICAL DATA:  Diffuse abdominal pain, acute respiratory failure EXAM: CHEST  2 VIEW COMPARISON:  CT chest of 11/15/2016 and chest x-ray of 02/01/2016 FINDINGS: There are slightly prominent markings at the left lung base which may be due to atelectasis with some elevation of of the left hemidiaphragm. Developing pneumonia cannot be excluded and followup is recommended if clinically warranted. The right lung is clear. Heart size  is stable. No acute bony abnormality is seen. IMPRESSION: Prominent markings at the left lung base may reflect atelectasis, but early pneumonia cannot be excluded. Correlate clinically Electronically Signed   By: Ivar Drape M.D.   On: 11/24/2016 13:37   Mr Cervical Spine W Or Wo Contrast  Result Date: 11/23/2016 CLINICAL DATA:  41 y/o F; neck pain with concern for mass or infection. EXAM: MRI CERVICAL SPINE WITHOUT AND WITH CONTRAST TECHNIQUE: Multiplanar and multiecho pulse sequences of the cervical spine, to include the craniocervical junction and cervicothoracic junction, were obtained without and with intravenous contrast. CONTRAST:  44mL MULTIHANCE GADOBENATE DIMEGLUMINE 529 MG/ML IV SOLN COMPARISON:  None. FINDINGS: Alignment: Straightening of cervical lordosis with slight reversal at the C5-6 level. Vertebrae: No evidence of acute  fracture or discitis. There is edema and enhancement within the right C5-6 facet (series 6, image 1 and series 11, image 17). Cord: No abnormal cord signal or cord enhancement. No epidural collection. Posterior Fossa, vertebral arteries, paraspinal tissues: Mild enhancement and edema within the posterior paraspinal muscles of the neck in the suboccipital region and deep to the nuchal ligament from C2-C4. No discrete fluid collection. Disc levels: C2-3: No significant disc displacement, foraminal narrowing, or canal stenosis. C3-4: No significant disc displacement, foraminal narrowing, or canal stenosis. C4-5: No significant disc displacement, foraminal narrowing, or canal stenosis. C5-6: Central disc protrusion with mild anterior cord impingement and flattening. Bilateral uncovertebral and facet hypertrophy with mild foraminal narrowing. No significant canal stenosis. C6-7: Minimal disc bulge. No significant foraminal narrowing or canal stenosis. C7-T1: No significant disc displacement, foraminal narrowing, or canal stenosis. IMPRESSION: 1. Mild enhancement and edema within the  posterior paraspinal muscles in the suboccipital region and deep to the nuchal ligament from C2 through C4. Findings may represents traumatic, infectious, or inflammatory myositis. No discrete fluid collection is identified. 2. Mild edema and enhancement within the right C5-6 facet is probably degenerative. 3. No evidence for discitis, epidural collection, or mass. 4. Mild cervical spondylosis predominantly at the C5-6 level where a small central disc protrusion impinges the anterior cord with flattening. No high-grade canal stenosis or cord compression. Electronically Signed   By: Kristine Garbe M.D.   On: 11/23/2016 15:51    EKG:   Orders placed or performed during the hospital encounter of 11/23/16  . ED EKG 12-Lead  . ED EKG 12-Lead      Management plans discussed with the patient, family and they are in agreement.  CODE STATUS:     Code Status Orders        Start     Ordered   11/23/16 1955  Full code  Continuous     11/23/16 1954    Code Status History    Date Active Date Inactive Code Status Order ID Comments User Context   11/14/2016  4:32 PM 11/15/2016  8:36 PM Full Code [id number]  Dustin Flock, MD Inpatient      TOTAL TIME TAKING CARE OF THIS PATIENT: 40 minutes.    Theodoro Grist M.D on 11/25/2016 at 1:48 PM  Between 7am to 6pm - Pager - [phone]  After 6pm go to [email or web address] - password [email or web address] [facility]  [facility]  Office  [phone]  CC: Primary care physician; No PCP Per Patient

## 2016-11-25 NOTE — Consult Note (Signed)
Follow-up inflammatory arthritis Joints much better. Left wrist less swollen. Knee less painful. Able to move her neck in all directions. Saw ophthalmology. Now on steroid drops. Has follow-up.   PE:No fever, right hand without synovitis. Left wrist with movement minimal tenderness along the extensor tendons of the left. Left knee effusion less than before. Ankles and toes without synovitis Achilles insertion nontender Right eye less inflamed  Impression:. 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 I would like to see her next week. Ophthalmology follow-up We'll decide about follow-up regarding her abdominal adenopathy and decide about remittive agent Thank you

## 2016-11-25 NOTE — Care Management (Signed)
Patient to discharge home today.  Patient to discharge with prednisone, cipro eye drops, and pain medication.  Prednisone $4, eye drops $8.50, pain medication $9.00.  Patient provided coupons for each medication.

## 2016-11-26 ENCOUNTER — Ambulatory Visit: Payer: Self-pay | Admitting: Surgery

## 2016-11-26 LAB — INFLAMMATORY BOWEL DISEASE-IBD
Atypical P-ANCA titer: <1:20 {titer}
Saccharomyces cerevisiae, IgA: <20 Units (ref 0.0–24.9)
Saccharomyces cerevisiae, IgG: 37.1 Units — ABNORMAL HIGH (ref 0.0–24.9)

## 2016-11-26 LAB — PROTEIN ELECTROPHORESIS, SERUM
A/G RATIO SPE: 0.5 — AB (ref 0.7–1.7)
ALBUMIN ELP: 2.4 g/dL — AB (ref 2.9–4.4)
Alpha-1-Globulin: 0.6 g/dL — ABNORMAL HIGH (ref 0.0–0.4)
Alpha-2-Globulin: 1.4 g/dL — ABNORMAL HIGH (ref 0.4–1.0)
BETA GLOBULIN: 0.9 g/dL (ref 0.7–1.3)
GLOBULIN, TOTAL: 4.5 g/dL — AB (ref 2.2–3.9)
Gamma Globulin: 1.6 g/dL (ref 0.4–1.8)
TOTAL PROTEIN ELP: 6.9 g/dL (ref 6.0–8.5)

## 2016-11-28 LAB — CULTURE, BLOOD (ROUTINE X 2)
CULTURE: NO GROWTH
CULTURE: NO GROWTH

## 2016-11-29 LAB — MISC LABCORP TEST (SEND OUT): Labcorp test code: 6924

## 2017-07-08 ENCOUNTER — Emergency Department
Admission: EM | Admit: 2017-07-08 | Discharge: 2017-07-08 | Disposition: A | Discharge status: Home / Self Care | Payer: Self-pay | Attending: Emergency Medicine | Admitting: Emergency Medicine

## 2017-07-08 ENCOUNTER — Encounter: Payer: Self-pay | Admitting: *Deleted

## 2017-07-08 DIAGNOSIS — F17210 Nicotine dependence, cigarettes, uncomplicated: Secondary | ICD-10-CM | POA: Insufficient documentation

## 2017-07-08 DIAGNOSIS — Z79899 Other long term (current) drug therapy: Secondary | ICD-10-CM | POA: Insufficient documentation

## 2017-07-08 DIAGNOSIS — M5412 Radiculopathy, cervical region: Secondary | ICD-10-CM | POA: Insufficient documentation

## 2017-07-08 MED ORDER — CYCLOBENZAPRINE HCL 10 MG PO TABS: 10.0000 mg | ORAL_TABLET | Freq: Three times a day (TID) | ORAL | 0 refills | Status: DC | PRN

## 2017-07-08 MED ORDER — POVIDONE-IODINE 10 % EX SOLN: CUTANEOUS | Status: DC | PRN

## 2017-07-08 MED ORDER — TRAMADOL HCL 50 MG PO TABS: 50.0000 mg | ORAL_TABLET | Freq: Four times a day (QID) | ORAL | 0 refills | Status: DC | PRN

## 2017-07-08 MED ORDER — METHYLPREDNISOLONE 4 MG PO TBPK: ORAL_TABLET | ORAL | 0 refills | Status: DC

## 2017-07-08 MED ORDER — SULFAMETHOXAZOLE-TRIMETHOPRIM 800-160 MG PO TABS: 1.0000 | ORAL_TABLET | Freq: Two times a day (BID) | ORAL | 0 refills | Status: DC

## 2017-07-08 NOTE — ED Provider Notes (Signed)
[facility] Emergency Department Provider Note   ____________________________________________   First MD Initiated Contact with Patient 07/08/17 [phone]     (approximate)  I have reviewed the triage vital signs and the nursing notes.   HISTORY  Chief Complaint Back Pain    HPI Regina Carpenter is a 41 y.o. female patient complain of neck and upper back pain for 2 days. Patient stated pain radiates to her upper extremities.Patient denies provocative incident for her complaint. Patient rates the pain as a 10 over 10. Patient described a pain as "achy". No palliative measures for complaint. Review of patient's history state shows that history of inflammatory polyarthritis which has not been follow-up since since discharge from hospital in February 2018.  Past Medical History:  Diagnosis Date  . Arthralgia of right knee   . Arthritis   . Cellulitis of left leg   . Lymphadenopathy, mesenteric   . Right hand pain   . Tobacco abuse     Patient Active Problem List   Diagnosis Date Noted  . Hyponatremia 11/25/2016  . Hypokalemia 11/25/2016  . Leukocytosis 11/25/2016  . Generalized weakness 11/25/2016  . Generalized pain 11/25/2016  . Conjunctivitis 11/25/2016  . Scleritis and episcleritis of right eye   . Inflammatory polyarthritis ([place]) 11/23/2016  . Abdominal pain 11/14/2016  . Lymphadenopathy, mesenteric   . Arthralgia of right knee 04/02/2015  . Cellulitis of left leg 03/21/2015  . Right hand pain 03/21/2015  . Tobacco use 03/21/2015    Past Surgical History:  Procedure Laterality Date  . denies      Prior to Admission medications   Medication Sig Start Date End Date Taking? Authorizing Provider  cyclobenzaprine (FLEXERIL) 10 MG tablet Take 1 tablet (10 mg total) by mouth 3 (three) times daily as needed. 11/19/16   Sable Feil, PA-C  cyclobenzaprine (FLEXERIL) 10 MG tablet Take 1 tablet (10 mg total) by mouth 3 (three) times daily as  needed. 07/08/17   Sable Feil, PA-C  HYDROcodone-acetaminophen (NORCO/VICODIN) 5-325 MG tablet Take 1-2 tablets by mouth every 4 (four) hours as needed for moderate pain. 11/25/16   Theodoro Grist, MD  methylPREDNISolone (MEDROL DOSEPAK) 4 MG TBPK tablet Take Tapered dose as directed 07/08/17   Sable Feil, PA-C  nicotine (NICODERM CQ - DOSED IN MG/24 HOURS) 14 mg/24hr patch Place 1 patch (14 mg total) onto the skin daily. 11/25/16   Theodoro Grist, MD  prednisoLONE acetate (PRED FORTE) 1 % ophthalmic suspension Place 1 drop into the right eye 5 (five) times daily. 11/25/16   Theodoro Grist, MD  predniSONE (DELTASONE) 20 MG tablet Take 3 tablets (60 mg total) by mouth daily with breakfast. 11/25/16   Theodoro Grist, MD  traMADol (ULTRAM) 50 MG tablet Take 1 tablet (50 mg total) by mouth every 6 (six) hours as needed for moderate pain. 07/08/17   Sable Feil, PA-C    Allergies Ibuprofen  Family History  Problem Relation Age of Onset  . Leukemia Maternal Grandfather   . Alcohol abuse Father     Social History Social History  Substance Use Topics  . Smoking status: Current Every Day Smoker    Packs/day: 1.00    Types: Cigarettes  . Smokeless tobacco: Never Used  . Alcohol use No    Review of Systems Constitutional: No fever/chills Eyes: No visual changes. ENT: No sore throat. Cardiovascular: Denies chest pain. Respiratory: Denies shortness of breath. Gastrointestinal: No abdominal pain.  No nausea, no vomiting.  No  diarrhea.  No constipation. Genitourinary: Negative for dysuria. Musculoskeletal: Arthritis Skin: Negative for rash. Neurological: Negative for headaches, focal weakness or numbness. Allergic/Immunilogical: Ibuprofen ____________________________________________   PHYSICAL EXAM:  VITAL SIGNS: ED Triage Vitals  Enc Vitals Group     BP 07/08/17 0723 126/84     Pulse Rate 07/08/17 0723 91     Resp 07/08/17 0723 20     Temp 07/08/17 0723 98.4 F (36.9 C)      Temp Source 07/08/17 0723 Oral     SpO2 07/08/17 0723 100 %     Weight 07/08/17 0721 200 lb (90.7 kg)     Height 07/08/17 0721 5\' 5"  (1.651 m)     Head Circumference --      Peak Flow --      Pain Score 07/08/17 0721 10     Pain Loc --      Pain Edu? --      Excl. in [place]? --    Constitutional: Alert and oriented. Well appearing and in no acute distress. Neck: No stridor.   cervical spine tenderness to palpation C5-C7. Cardiovascular: Normal rate, regular rhythm. Grossly normal heart sounds.  Good peripheral circulation. Respiratory: Normal respiratory effort.  No retractions. Lungs CTAB. Musculoskeletal: No lower extremity tenderness nor edema.  No joint effusions. Neurologic:  Normal speech and language. No gross focal neurologic deficits are appreciated. No gait instability. Skin:  Skin is warm, dry and intact. No rash noted. Psychiatric: Mood and affect are normal. Speech and behavior are normal.  ____________________________________________   LABS (all labs ordered are listed, but only abnormal results are displayed)  Labs Reviewed - No data to display ____________________________________________  EKG   ____________________________________________  RADIOLOGY  No results found.  __Reviewed MRI of cervical spine which was performed February 2014. __________________________________________   PROCEDURES  Procedure(s) performed: None  Procedures  Critical Care performed: No  ____________________________________________   INITIAL IMPRESSION / ASSESSMENT AND PLAN / ED COURSE    Radicular neck pain and upper shortness. Discuss MRI findings with patient. Patient given discharge care instructions. Patient advised follow-up PCP for definitive evaluation and treatment.      ____________________________________________   FINAL CLINICAL IMPRESSION(S) / ED DIAGNOSES  Final diagnoses:  Cervical radicular pain      NEW MEDICATIONS STARTED DURING THIS  VISIT:  Discharge Medication List as of 07/08/2017  7:59 AM    START taking these medications   Details  !! cyclobenzaprine (FLEXERIL) 10 MG tablet Take 1 tablet (10 mg total) by mouth 3 (three) times daily as needed., Starting Fri 07/08/2017, Print    methylPREDNISolone (MEDROL DOSEPAK) 4 MG TBPK tablet Take Tapered dose as directed, Print    traMADol (ULTRAM) 50 MG tablet Take 1 tablet (50 mg total) by mouth every 6 (six) hours as needed for moderate pain., Starting Fri 07/08/2017, Print     !! - Potential duplicate medications found. Please discuss with provider.       Note:  This document was prepared using Dragon voice recognition software and may include unintentional dictation errors.    Sable Feil, PA-C 07/08/17 [id number]    Lavonia Drafts, MD 07/08/17 [phone]

## 2017-07-08 NOTE — ED Triage Notes (Signed)
Pt complains of upper back pain two days, pt denies any other symptoms, pt denies injury

## 2017-07-08 NOTE — ED Notes (Signed)
See triage  note  Presents with pain to upper back and neck  States pain radiates into left shoulder and arm  Denies any injury but states she woke up like this 2 days ago

## 2017-07-25 ENCOUNTER — Encounter: Payer: Self-pay | Admitting: Emergency Medicine

## 2017-07-25 DIAGNOSIS — J209 Acute bronchitis, unspecified: Secondary | ICD-10-CM | POA: Insufficient documentation

## 2017-07-25 DIAGNOSIS — Z79899 Other long term (current) drug therapy: Secondary | ICD-10-CM | POA: Insufficient documentation

## 2017-07-25 DIAGNOSIS — F17210 Nicotine dependence, cigarettes, uncomplicated: Secondary | ICD-10-CM | POA: Insufficient documentation

## 2017-07-25 LAB — POCT RAPID STREP A: STREPTOCOCCUS, GROUP A SCREEN (DIRECT): NEGATIVE

## 2017-07-25 NOTE — ED Triage Notes (Signed)
Pt c/o sore throat and cough x3 days. Pt denies SOB.

## 2017-07-26 ENCOUNTER — Emergency Department
Admission: EM | Admit: 2017-07-26 | Discharge: 2017-07-26 | Disposition: A | Discharge status: Home / Self Care | Payer: Self-pay | Attending: Emergency Medicine | Admitting: Emergency Medicine

## 2017-07-26 ENCOUNTER — Emergency Department: Payer: Self-pay

## 2017-07-26 DIAGNOSIS — J209 Acute bronchitis, unspecified: Secondary | ICD-10-CM

## 2017-07-26 DIAGNOSIS — J029 Acute pharyngitis, unspecified: Secondary | ICD-10-CM

## 2017-07-26 MED ORDER — AZITHROMYCIN 500 MG PO TABS
500.0000 mg | ORAL_TABLET | Freq: Once | ORAL | Status: AC
  Administered 2017-07-26: 500 mg via ORAL
  Filled 2017-07-26: qty 1

## 2017-07-26 MED ORDER — AZITHROMYCIN 500 MG PO TABS: 500.0000 mg | ORAL_TABLET | Freq: Every day | ORAL | 0 refills | Status: AC

## 2017-07-26 MED ORDER — ALBUTEROL SULFATE HFA 108 (90 BASE) MCG/ACT IN AERS: 2.0000 | INHALATION_SPRAY | Freq: Four times a day (QID) | RESPIRATORY_TRACT | 0 refills | Status: DC | PRN

## 2017-07-26 MED ORDER — BENZONATATE 100 MG PO CAPS: 100.0000 mg | ORAL_CAPSULE | Freq: Three times a day (TID) | ORAL | 0 refills | Status: AC | PRN

## 2017-07-26 MED ORDER — PREDNISONE 20 MG PO TABS
60.0000 mg | ORAL_TABLET | Freq: Once | ORAL | Status: AC
  Administered 2017-07-26: 60 mg via ORAL
  Filled 2017-07-26: qty 3

## 2017-07-26 MED ORDER — HYDROCOD POLST-CPM POLST ER 10-8 MG/5ML PO SUER
5.0000 mL | Freq: Once | ORAL | Status: AC
  Administered 2017-07-26: 5 mL via ORAL
  Filled 2017-07-26: qty 5

## 2017-07-26 NOTE — ED Provider Notes (Signed)
[facility] Emergency Department Provider Note    First MD Initiated Contact with Patient 07/26/17 0132     (approximate)  I have reviewed the triage vital signs and the nursing notes.   HISTORY  Chief Complaint Sore Throat    HPI Regina Carpenter is a 41 y.o. female with loss of chronic medical conditions including tobacco use approximately half pack per day presents to the emergency department withthree-day history of sore throat and cough. Patient denies any fever or febrile on presentation temperature 98.6. Patient states her current pain score is 10 out of 10. Pain is worse with swallowing and coughing.   Past Medical History:  Diagnosis Date  . Arthralgia of right knee   . Arthritis   . Cellulitis of left leg   . Lymphadenopathy, mesenteric   . Right hand pain   . Tobacco abuse     Patient Active Problem List   Diagnosis Date Noted  . Hyponatremia 11/25/2016  . Hypokalemia 11/25/2016  . Leukocytosis 11/25/2016  . Generalized weakness 11/25/2016  . Generalized pain 11/25/2016  . Conjunctivitis 11/25/2016  . Scleritis and episcleritis of right eye   . Inflammatory polyarthritis ([place]) 11/23/2016  . Abdominal pain 11/14/2016  . Lymphadenopathy, mesenteric   . Arthralgia of right knee 04/02/2015  . Cellulitis of left leg 03/21/2015  . Right hand pain 03/21/2015  . Tobacco use 03/21/2015    Past Surgical History:  Procedure Laterality Date  . denies      Prior to Admission medications   Medication Sig Start Date End Date Taking? Authorizing Provider  albuterol (PROVENTIL HFA;VENTOLIN HFA) 108 (90 Base) MCG/ACT inhaler Inhale 2 puffs into the lungs every 6 (six) hours as needed for wheezing or shortness of breath. 07/26/17   Gregor Hams, MD  azithromycin (ZITHROMAX) 500 MG tablet Take 1 tablet (500 mg total) by mouth daily. Take 1 tablet daily for 3 days. 07/26/17 07/29/17  Gregor Hams, MD  benzonatate (TESSALON PERLES) 100  MG capsule Take 1 capsule (100 mg total) by mouth 3 (three) times daily as needed for cough. 07/26/17 08/05/17  Gregor Hams, MD  cyclobenzaprine (FLEXERIL) 10 MG tablet Take 1 tablet (10 mg total) by mouth 3 (three) times daily as needed. 11/19/16   Sable Feil, PA-C  cyclobenzaprine (FLEXERIL) 10 MG tablet Take 1 tablet (10 mg total) by mouth 3 (three) times daily as needed. 07/08/17   Sable Feil, PA-C  HYDROcodone-acetaminophen (NORCO/VICODIN) 5-325 MG tablet Take 1-2 tablets by mouth every 4 (four) hours as needed for moderate pain. 11/25/16   Theodoro Grist, MD  methylPREDNISolone (MEDROL DOSEPAK) 4 MG TBPK tablet Take Tapered dose as directed 07/08/17   Sable Feil, PA-C  nicotine (NICODERM CQ - DOSED IN MG/24 HOURS) 14 mg/24hr patch Place 1 patch (14 mg total) onto the skin daily. 11/25/16   Theodoro Grist, MD  prednisoLONE acetate (PRED FORTE) 1 % ophthalmic suspension Place 1 drop into the right eye 5 (five) times daily. 11/25/16   Theodoro Grist, MD  predniSONE (DELTASONE) 20 MG tablet Take 3 tablets (60 mg total) by mouth daily with breakfast. 11/25/16   Theodoro Grist, MD  traMADol (ULTRAM) 50 MG tablet Take 1 tablet (50 mg total) by mouth every 6 (six) hours as needed for moderate pain. 07/08/17   Sable Feil, PA-C    Allergies Ibuprofen  Family History  Problem Relation Age of Onset  . Leukemia Maternal Grandfather   . Alcohol abuse Father  Social History Social History  Substance Use Topics  . Smoking status: Current Every Day Smoker    Packs/day: 1.00    Types: Cigarettes  . Smokeless tobacco: Never Used  . Alcohol use No    Review of Systems Constitutional: No fever/chills Eyes: No visual changes. ENT: positive for sore throat. Cardiovascular: Denies chest pain. Respiratory: Denies shortness of breath.positive for cough Gastrointestinal: No abdominal pain.  No nausea, no vomiting.  No diarrhea.  No constipation. Genitourinary: Negative for  dysuria. Musculoskeletal: Negative for neck pain.  Negative for back pain. Integumentary: Negative for rash. Neurological: Negative for headaches, focal weakness or numbness.   ____________________________________________   PHYSICAL EXAM:  VITAL SIGNS: ED Triage Vitals  Enc Vitals Group     BP 07/25/17 2225 137/82     Pulse Rate 07/25/17 2225 96     Resp 07/25/17 2225 16     Temp 07/25/17 2225 98.6 F (37 C)     Temp Source 07/25/17 2225 Oral     SpO2 07/25/17 2225 100 %     Weight 07/25/17 2226 90.7 kg (200 lb)     Height --      Head Circumference --      Peak Flow --      Pain Score --      Pain Loc --      Pain Edu? --      Excl. in [place]? --     Constitutional: Alert and oriented. Well appearing and in no acute distress. Eyes: Conjunctivae are normal.  Head: Atraumatic. Mouth/Throat: Mucous membranes are moist.pharyngeal erythema no exudates noted Neck: No stridor.  Positive anterior cervical lymphadenopathy. Cardiovascular: Normal rate, regular rhythm. Good peripheral circulation. Grossly normal heart sounds. Respiratory: Normal respiratory effort.  No retractions. Lungs CTAB. Gastrointestinal: Soft and nontender. No distention.  Musculoskeletal: No lower extremity tenderness nor edema. No gross deformities of extremities. Neurologic:  Normal speech and language. No gross focal neurologic deficits are appreciated.  Skin:  Skin is warm, dry and intact. No rash noted.   ____________________________________________   LABS (all labs ordered are listed, but only abnormal results are displayed)  Labs Reviewed  CULTURE, GROUP A STREP [facility])  POCT RAPID STREP A    RADIOLOGY I, [place], personally viewed and evaluated these images (plain radiographs) as part of my medical decision making, as well as reviewing the written report by the radiologist.  Dg Chest 2 View  Result Date: 07/26/2017 CLINICAL DATA:  Acute onset of sore throat and cough. Initial  encounter. EXAM: CHEST  2 VIEW COMPARISON:  Chest radiograph performed 11/24/2016 FINDINGS: The lungs are well-aerated and clear. There is no evidence of focal opacification, pleural effusion or pneumothorax. The heart is normal in size; the mediastinal contour is within normal limits. No acute osseous abnormalities are seen. IMPRESSION: No acute cardiopulmonary process seen. Electronically Signed   By: Garald Balding M.D.   On: 07/26/2017 01:57    ____________________________________________    Procedures   ____________________________________________   INITIAL IMPRESSION / ASSESSMENT AND PLAN / ED COURSE  As part of my medical decision making, I reviewed the following data within the electronic MEDICAL RECORD NUMBER6 year old female present with history of physical exam concerning for pharyngitis, bronchitis and less likely pneumonia. Chest x-ray was performed which revealed no evidence of pneumonia. Patient given azithromycin and Tussionex in the emergency department. Patient will be prescribed azithromycin and Tessalon Perles for home as well as an albuterol inhaler.     ____________________________________________  FINAL CLINICAL IMPRESSION(S) / ED DIAGNOSES  Final diagnoses:  Acute bronchitis, unspecified organism  Pharyngitis, unspecified etiology     MEDICATIONS GIVEN DURING THIS VISIT:  Medications  azithromycin (ZITHROMAX) tablet 500 mg (500 mg Oral Given 07/26/17 0225)  chlorpheniramine-HYDROcodone (TUSSIONEX) 10-8 MG/5ML suspension 5 mL (5 mLs Oral Given 07/26/17 0226)  predniSONE (DELTASONE) tablet 60 mg (60 mg Oral Given 07/26/17 0225)     NEW OUTPATIENT MEDICATIONS STARTED DURING THIS VISIT:  New Prescriptions   ALBUTEROL (PROVENTIL HFA;VENTOLIN HFA) 108 (90 BASE) MCG/ACT INHALER    Inhale 2 puffs into the lungs every 6 (six) hours as needed for wheezing or shortness of breath.   AZITHROMYCIN (ZITHROMAX) 500 MG TABLET    Take 1 tablet (500 mg total) by mouth  daily. Take 1 tablet daily for 3 days.   BENZONATATE (TESSALON PERLES) 100 MG CAPSULE    Take 1 capsule (100 mg total) by mouth 3 (three) times daily as needed for cough.    Modified Medications   No medications on file    Discontinued Medications   No medications on file     Note:  This document was prepared using Dragon voice recognition software and may include unintentional dictation errors.    Gregor Hams, MD 07/26/17 [phone]

## 2017-07-28 LAB — CULTURE, GROUP A STREP (THRC)

## 2018-03-01 ENCOUNTER — Emergency Department
Admission: EM | Admit: 2018-03-01 | Discharge: 2018-03-01 | Disposition: A | Discharge status: Home / Self Care | Payer: 59 | Attending: Emergency Medicine | Admitting: Emergency Medicine

## 2018-03-01 ENCOUNTER — Other Ambulatory Visit: Payer: Self-pay

## 2018-03-01 ENCOUNTER — Encounter: Payer: Self-pay | Admitting: Emergency Medicine

## 2018-03-01 DIAGNOSIS — F17210 Nicotine dependence, cigarettes, uncomplicated: Secondary | ICD-10-CM | POA: Diagnosis not present

## 2018-03-01 DIAGNOSIS — R1084 Generalized abdominal pain: Secondary | ICD-10-CM

## 2018-03-01 DIAGNOSIS — Z79899 Other long term (current) drug therapy: Secondary | ICD-10-CM | POA: Diagnosis not present

## 2018-03-01 DIAGNOSIS — E860 Dehydration: Secondary | ICD-10-CM | POA: Insufficient documentation

## 2018-03-01 DIAGNOSIS — N3090 Cystitis, unspecified without hematuria: Secondary | ICD-10-CM | POA: Insufficient documentation

## 2018-03-01 LAB — CBC
HEMATOCRIT: 38.3 % (ref 35.0–47.0)
Hemoglobin: 12.7 g/dL (ref 12.0–16.0)
MCH: 27.4 pg (ref 26.0–34.0)
MCHC: 33.1 g/dL (ref 32.0–36.0)
MCV: 82.6 fL (ref 80.0–100.0)
Platelets: 254 10*3/uL (ref 150–440)
RBC: 4.64 MIL/uL (ref 3.80–5.20)
RDW: 14.7 % — ABNORMAL HIGH (ref 11.5–14.5)
WBC: 13.7 10*3/uL — ABNORMAL HIGH (ref 3.6–11.0)

## 2018-03-01 LAB — URINALYSIS, COMPLETE (UACMP) WITH MICROSCOPIC
Bilirubin Urine: NEGATIVE
Glucose, UA: NEGATIVE mg/dL
Hgb urine dipstick: NEGATIVE
Ketones, ur: 5 mg/dL — AB
Nitrite: NEGATIVE
Protein, ur: NEGATIVE mg/dL
SPECIFIC GRAVITY, URINE: 1.02 (ref 1.005–1.030)
WBC, UA: >50 WBC/hpf — ABNORMAL HIGH (ref 0–5)
pH: 5 (ref 5.0–8.0)

## 2018-03-01 LAB — COMPREHENSIVE METABOLIC PANEL
ALBUMIN: 3.7 g/dL (ref 3.5–5.0)
ALK PHOS: 63 U/L (ref 38–126)
ALT: 9 U/L — ABNORMAL LOW (ref 14–54)
ANION GAP: 7 (ref 5–15)
AST: 17 U/L (ref 15–41)
BUN: 7 mg/dL (ref 6–20)
CO2: 20 mmol/L — AB (ref 22–32)
Calcium: 8.8 mg/dL — ABNORMAL LOW (ref 8.9–10.3)
Chloride: 106 mmol/L (ref 101–111)
Creatinine, Ser: 0.76 mg/dL (ref 0.44–1.00)
GFR calc Af Amer: >60 mL/min (ref >60)
GFR calc non Af Amer: >60 mL/min (ref >60)
GLUCOSE: 139 mg/dL — AB (ref 65–99)
POTASSIUM: 3 mmol/L — AB (ref 3.5–5.1)
SODIUM: 133 mmol/L — AB (ref 135–145)
Total Bilirubin: 0.6 mg/dL (ref 0.3–1.2)
Total Protein: 7.9 g/dL (ref 6.5–8.1)

## 2018-03-01 LAB — POCT PREGNANCY, URINE: PREG TEST UR: NEGATIVE

## 2018-03-01 LAB — LIPASE, BLOOD: Lipase: 26 U/L (ref 11–51)

## 2018-03-01 MED ORDER — SODIUM CHLORIDE 0.9 % IV BOLUS
1000.0000 mL | Freq: Once | INTRAVENOUS | Status: AC
  Administered 2018-03-01: 1000 mL via INTRAVENOUS

## 2018-03-01 MED ORDER — KETOROLAC TROMETHAMINE 30 MG/ML IJ SOLN
15.0000 mg | INTRAMUSCULAR | Status: AC
  Administered 2018-03-01: 15 mg via INTRAVENOUS
  Filled 2018-03-01: qty 1

## 2018-03-01 MED ORDER — SODIUM CHLORIDE 0.9 % IV SOLN
2.0000 g | Freq: Once | INTRAVENOUS | Status: AC
  Administered 2018-03-01: 2 g via INTRAVENOUS
  Filled 2018-03-01: qty 2

## 2018-03-01 MED ORDER — CEPHALEXIN 500 MG PO CAPS: 500.0000 mg | ORAL_CAPSULE | Freq: Two times a day (BID) | ORAL | 0 refills | Status: DC

## 2018-03-01 MED ORDER — ONDANSETRON HCL 4 MG/2ML IJ SOLN
4.0000 mg | Freq: Once | INTRAMUSCULAR | Status: AC
  Administered 2018-03-01: 4 mg via INTRAVENOUS
  Filled 2018-03-01: qty 2

## 2018-03-01 MED ORDER — ONDANSETRON 4 MG PO TBDP: 4.0000 mg | ORAL_TABLET | Freq: Three times a day (TID) | ORAL | 0 refills | Status: DC | PRN

## 2018-03-01 NOTE — ED Provider Notes (Signed)
[facility] Emergency Department Provider Note  ____________________________________________  Time seen: Approximately 2:26 PM  I have reviewed the triage vital signs and the nursing notes.   HISTORY  Chief Complaint Abdominal Pain    HPI Regina Carpenter is a 42 y.o. female with a history of rheumatoid arthritis, cellulitis presents with general malaise weakness, generalized abdominal pain which is nonradiating, moderate intensity, aching, no aggravating or alleviating factors. Associated with nausea but no vomiting or constipation. Has had some loose bowel movements today. States it feels like when she was told she had "inflammation" in the past.  Review of electronic medical record shows that she is seeing Dr. Jefm Bryant from rheumatology, diagnosed with seronegative inflammatory arthritis. She was prescribed methotrexate back in March, but patient states she just filled the prescription and has not taken it yet.   She denies dysuria frequency urgency. No fevers or chills. No back pain.      Past Medical History:  Diagnosis Date  . Arthralgia of right knee   . Arthritis   . Cellulitis of left leg   . Lymphadenopathy, mesenteric   . Right hand pain   . Tobacco abuse      Patient Active Problem List   Diagnosis Date Noted  . Hyponatremia 11/25/2016  . Hypokalemia 11/25/2016  . Leukocytosis 11/25/2016  . Generalized weakness 11/25/2016  . Generalized pain 11/25/2016  . Conjunctivitis 11/25/2016  . Scleritis and episcleritis of right eye   . Inflammatory polyarthritis ([place]) 11/23/2016  . Abdominal pain 11/14/2016  . Lymphadenopathy, mesenteric   . Arthralgia of right knee 04/02/2015  . Cellulitis of left leg 03/21/2015  . Right hand pain 03/21/2015  . Tobacco use 03/21/2015     Past Surgical History:  Procedure Laterality Date  . denies       Prior to Admission medications   Medication Sig Start Date End Date Taking? Authorizing  Provider  folic acid (FOLVITE) 1 MG tablet Take 1 mg by mouth daily. 02/28/18  Yes [provider]  methotrexate 2.5 MG tablet Take 15 mg by mouth once a week. 02/28/18  Yes [provider]  albuterol (PROVENTIL HFA;VENTOLIN HFA) 108 (90 Base) MCG/ACT inhaler Inhale 2 puffs into the lungs every 6 (six) hours as needed for wheezing or shortness of breath. Patient not taking: Reported on 03/01/2018 07/26/17   Gregor Hams, MD  cephALEXin (KEFLEX) 500 MG capsule Take 1 capsule (500 mg total) by mouth 2 (two) times daily. 03/01/18   Carrie Mew, MD  cyclobenzaprine (FLEXERIL) 10 MG tablet Take 1 tablet (10 mg total) by mouth 3 (three) times daily as needed. Patient not taking: Reported on 03/01/2018 11/19/16   Sable Feil, PA-C  cyclobenzaprine (FLEXERIL) 10 MG tablet Take 1 tablet (10 mg total) by mouth 3 (three) times daily as needed. Patient not taking: Reported on 03/01/2018 07/08/17   Sable Feil, PA-C  HYDROcodone-acetaminophen (NORCO/VICODIN) 5-325 MG tablet Take 1-2 tablets by mouth every 4 (four) hours as needed for moderate pain. Patient not taking: Reported on 03/01/2018 11/25/16   Theodoro Grist, MD  methylPREDNISolone (MEDROL DOSEPAK) 4 MG TBPK tablet Take Tapered dose as directed Patient not taking: Reported on 03/01/2018 07/08/17   Sable Feil, PA-C  nicotine (NICODERM CQ - DOSED IN MG/24 HOURS) 14 mg/24hr patch Place 1 patch (14 mg total) onto the skin daily. Patient not taking: Reported on 03/01/2018 11/25/16   Theodoro Grist, MD  ondansetron (ZOFRAN ODT) 4 MG disintegrating tablet Take 1 tablet (4  mg total) by mouth every 8 (eight) hours as needed for nausea or vomiting. 03/01/18   Carrie Mew, MD  prednisoLONE acetate (PRED FORTE) 1 % ophthalmic suspension Place 1 drop into the right eye 5 (five) times daily. Patient not taking: Reported on 03/01/2018 11/25/16   Theodoro Grist, MD  predniSONE (DELTASONE) 20 MG tablet Take 3 tablets (60 mg total) by  mouth daily with breakfast. Patient not taking: Reported on 03/01/2018 11/25/16   Theodoro Grist, MD  traMADol (ULTRAM) 50 MG tablet Take 1 tablet (50 mg total) by mouth every 6 (six) hours as needed for moderate pain. Patient not taking: Reported on 03/01/2018 07/08/17   Sable Feil, PA-C     Allergies Ibuprofen and Sulfasalazine   Family History  Problem Relation Age of Onset  . Leukemia Maternal Grandfather   . Alcohol abuse Father     Social History Social History   Tobacco Use  . Smoking status: Current Every Day Smoker    Packs/day: 0.50    Types: Cigarettes  . Smokeless tobacco: Never Used  Substance Use Topics  . Alcohol use: No  . Drug use: No    Review of Systems  Constitutional:   No fever or chills.  ENT:   No sore throat. No rhinorrhea. Cardiovascular:   No chest pain or syncope. Respiratory:   No dyspnea or cough. Gastrointestinal:   positive as above for abdominal pain without vomiting. Musculoskeletal:   Negative for focal pain or swelling All other systems reviewed and are negative except as documented above in ROS and HPI.  ____________________________________________   PHYSICAL EXAM:  VITAL SIGNS: ED Triage Vitals  Enc Vitals Group     BP 03/01/18 0845 103/68     Pulse Rate 03/01/18 0845 (!) 115     Resp 03/01/18 0845 16     Temp 03/01/18 0845 98.5 F (36.9 C)     Temp Source 03/01/18 0845 Oral     SpO2 03/01/18 0845 98 %     Weight 03/01/18 0843 200 lb (90.7 kg)     Height 03/01/18 0843 5\' 5"  (1.651 m)     Head Circumference --      Peak Flow --      Pain Score 03/01/18 0843 10     Pain Loc --      Pain Edu? --      Excl. in [place]? --     Vital signs reviewed, nursing assessments reviewed.   Constitutional:   Alert and oriented. Well appearing and in no distress. Eyes:   Conjunctivae are normal. EOMI. PERRL. ENT      Head:   Normocephalic and atraumatic.      Nose:   No congestion/rhinnorhea.       Mouth/Throat:   MMM, no  pharyngeal erythema. No peritonsillar mass.       Neck:   No meningismus. Full ROM. Hematological/Lymphatic/Immunilogical:   No cervical lymphadenopathy. Cardiovascular:   RRR. Symmetric bilateral radial and DP pulses.  No murmurs.  Respiratory:   Normal respiratory effort without tachypnea/retractions. Breath sounds are clear and equal bilaterally. No wheezes/rales/rhonchi. Gastrointestinal:   Soft with epigastric and suprapubic tenderness. Non distended. There is no CVA tenderness.  No rebound, rigidity, or guarding.no hernias  Musculoskeletal:   Normal range of motion in all extremities. No joint effusions.  No lower extremity tenderness.  No edema. Neurologic:   Normal speech and language.  Motor grossly intact. No acute focal neurologic deficits are appreciated.  Skin:  Skin is warm, dry and intact. No rash noted.  No petechiae, purpura, or bullae.  ____________________________________________    LABS (pertinent positives/negatives) (all labs ordered are listed, but only abnormal results are displayed) Labs Reviewed  COMPREHENSIVE METABOLIC PANEL - Abnormal; Notable for the following components:      Result Value   Sodium 133 (*)    Potassium 3.0 (*)    CO2 20 (*)    Glucose, Bld 139 (*)    Calcium 8.8 (*)    ALT 9 (*)    All other components within normal limits  CBC - Abnormal; Notable for the following components:   WBC 13.7 (*)    RDW 14.7 (*)    All other components within normal limits  URINALYSIS, COMPLETE (UACMP) WITH MICROSCOPIC - Abnormal; Notable for the following components:   Color, Urine YELLOW (*)    APPearance HAZY (*)    Ketones, ur 5 (*)    Leukocytes, UA LARGE (*)    WBC, UA >50 (*)    Bacteria, UA FEW (*)    All other components within normal limits  URINE CULTURE  LIPASE, BLOOD  POC URINE PREG, ED  POCT PREGNANCY, URINE   ____________________________________________   EKG  interpreted by me Sinus tachycardia rate 112, left axis, normal  intervals. Normal QRS ST segments and T waves. Right bundle branch block.  ____________________________________________    RADIOLOGY  No results found.  ____________________________________________   PROCEDURES Procedures  ____________________________________________  DIFFERENTIAL DIAGNOSIS   UTI, pancreatitis, biliary disease. Low suspicion for AAA dissection mesenteric ischemia appendicitis. No pyelonephritis  CLINICAL IMPRESSION / ASSESSMENT AND PLAN / ED COURSE  Pertinent labs & imaging results that were available during my care of the patient were reviewed by me and considered in my medical decision making (see chart for details).    patient well appearing, normal vital signs except for some mild tachycardia. Given IV fluids for hydration due to appearance of dehydration. Given ceftriaxone for urinary tract infection. She doesn't have pyelonephritis, not septic. Afterwards, she is feeling better, tolerating oral intake, vital signs are normal. Stable for discharge home, continue antiemetics with Keflex. Zofran as needed.      ____________________________________________   FINAL CLINICAL IMPRESSION(S) / ED DIAGNOSES    Final diagnoses:  Generalized abdominal pain  Cystitis  Dehydration     ED Discharge Orders        Ordered    cephALEXin (KEFLEX) 500 MG capsule  2 times daily     03/01/18 1426    ondansetron (ZOFRAN ODT) 4 MG disintegrating tablet  Every 8 hours PRN     03/01/18 1426      Portions of this note were generated with dragon dictation software. Dictation errors may occur despite best attempts at proofreading.    Carrie Mew, MD 03/01/18 [phone]

## 2018-03-01 NOTE — ED Triage Notes (Signed)
Pt arrived via POV with reports of upper abdominal pain which she describes as "squeezing" pain 10/10 since Monday. Pt also having diarrhea x2 episodes today.  Pt states hx of same pain and was diagnosed with inflammation.

## 2018-03-01 NOTE — ED Notes (Signed)
Pt ambulatory upon discharge. Verbalized understanding of discharge instructions, follow-up care and prescriptions. VSS. Skin warm and dry. A&O x4.  

## 2018-03-01 NOTE — ED Notes (Signed)
Patient sleeping in [place], rouses easily.  No new complaints.

## 2018-03-02 ENCOUNTER — Encounter: Payer: Self-pay | Admitting: Emergency Medicine

## 2018-03-02 ENCOUNTER — Emergency Department: Payer: Commercial Managed Care - HMO

## 2018-03-02 ENCOUNTER — Emergency Department
Admission: EM | Admit: 2018-03-02 | Discharge: 2018-03-02 | Disposition: A | Discharge status: Home / Self Care | Payer: Commercial Managed Care - HMO | Attending: Emergency Medicine | Admitting: Emergency Medicine

## 2018-03-02 ENCOUNTER — Other Ambulatory Visit: Payer: Self-pay

## 2018-03-02 DIAGNOSIS — R1084 Generalized abdominal pain: Secondary | ICD-10-CM | POA: Diagnosis present

## 2018-03-02 DIAGNOSIS — K654 Sclerosing mesenteritis: Secondary | ICD-10-CM | POA: Diagnosis not present

## 2018-03-02 DIAGNOSIS — R1030 Lower abdominal pain, unspecified: Secondary | ICD-10-CM | POA: Diagnosis not present

## 2018-03-02 DIAGNOSIS — F17210 Nicotine dependence, cigarettes, uncomplicated: Secondary | ICD-10-CM | POA: Insufficient documentation

## 2018-03-02 DIAGNOSIS — N3090 Cystitis, unspecified without hematuria: Secondary | ICD-10-CM | POA: Diagnosis not present

## 2018-03-02 DIAGNOSIS — R1013 Epigastric pain: Secondary | ICD-10-CM

## 2018-03-02 LAB — COMPREHENSIVE METABOLIC PANEL
ALT: 8 U/L — ABNORMAL LOW (ref 14–54)
ANION GAP: 9 (ref 5–15)
AST: 16 U/L (ref 15–41)
Albumin: 3.6 g/dL (ref 3.5–5.0)
Alkaline Phosphatase: 64 U/L (ref 38–126)
BUN: 7 mg/dL (ref 6–20)
CHLORIDE: 107 mmol/L (ref 101–111)
CO2: 22 mmol/L (ref 22–32)
Calcium: 9 mg/dL (ref 8.9–10.3)
Creatinine, Ser: 0.85 mg/dL (ref 0.44–1.00)
GFR calc non Af Amer: >60 mL/min (ref >60)
Glucose, Bld: 92 mg/dL (ref 65–99)
POTASSIUM: 3.6 mmol/L (ref 3.5–5.1)
Sodium: 138 mmol/L (ref 135–145)
Total Bilirubin: 0.3 mg/dL (ref 0.3–1.2)
Total Protein: 7.6 g/dL (ref 6.5–8.1)

## 2018-03-02 LAB — LIPASE, BLOOD: LIPASE: 28 U/L (ref 11–51)

## 2018-03-02 LAB — URINALYSIS, COMPLETE (UACMP) WITH MICROSCOPIC
BACTERIA UA: NONE SEEN
BILIRUBIN URINE: NEGATIVE
Glucose, UA: NEGATIVE mg/dL
Hgb urine dipstick: NEGATIVE
KETONES UR: NEGATIVE mg/dL
Nitrite: NEGATIVE
PH: 5 (ref 5.0–8.0)
Protein, ur: NEGATIVE mg/dL
Specific Gravity, Urine: 1.021 (ref 1.005–1.030)

## 2018-03-02 LAB — CBC
HEMATOCRIT: 37.3 % (ref 35.0–47.0)
HEMOGLOBIN: 12.5 g/dL (ref 12.0–16.0)
MCH: 27.6 pg (ref 26.0–34.0)
MCHC: 33.4 g/dL (ref 32.0–36.0)
MCV: 82.7 fL (ref 80.0–100.0)
PLATELETS: 242 10*3/uL (ref 150–440)
RBC: 4.51 MIL/uL (ref 3.80–5.20)
RDW: 14.6 % — ABNORMAL HIGH (ref 11.5–14.5)
WBC: 11.5 10*3/uL — AB (ref 3.6–11.0)

## 2018-03-02 MED ORDER — IOPAMIDOL (ISOVUE-300) INJECTION 61%
100.0000 mL | Freq: Once | INTRAVENOUS | Status: AC | PRN
  Administered 2018-03-02: 100 mL via INTRAVENOUS

## 2018-03-02 MED ORDER — MORPHINE SULFATE (PF) 4 MG/ML IV SOLN
4.0000 mg | Freq: Once | INTRAVENOUS | Status: AC
  Administered 2018-03-02: 4 mg via INTRAVENOUS
  Filled 2018-03-02: qty 1

## 2018-03-02 MED ORDER — HYDROCODONE-ACETAMINOPHEN 5-325 MG PO TABS: 1.0000 | ORAL_TABLET | Freq: Four times a day (QID) | ORAL | 0 refills | Status: DC | PRN

## 2018-03-02 MED ORDER — IOPAMIDOL (ISOVUE-300) INJECTION 61%
15.0000 mL | Freq: Once | INTRAVENOUS | Status: AC | PRN
  Administered 2018-03-02: 15 mL via ORAL

## 2018-03-02 MED ORDER — ONDANSETRON HCL 4 MG/2ML IJ SOLN
4.0000 mg | Freq: Once | INTRAMUSCULAR | Status: AC
  Administered 2018-03-02: 4 mg via INTRAVENOUS
  Filled 2018-03-02: qty 2

## 2018-03-02 MED ORDER — FAMOTIDINE IN NACL 20-0.9 MG/50ML-% IV SOLN
20.0000 mg | Freq: Once | INTRAVENOUS | Status: AC
  Administered 2018-03-02: 20 mg via INTRAVENOUS
  Filled 2018-03-02: qty 50

## 2018-03-02 NOTE — ED Notes (Signed)
Patient transported to CT 

## 2018-03-02 NOTE — Discharge Instructions (Signed)
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 Please follow-up with Dr. Joette Catching notable. Please return here for worse pain fever vomiting or feeling sicker. use the Vicodin as needed for pain gets too bad. Do not use Tylenol all the time. You can have a chronic overdose on Tylenol if you're using too much of it. I will give you some Vicodin try one pill 4 times a day if needed for severe pain. We can try some Motrin as well. Please be sure not to take the Tylenol and Vicodin together as there is Tylenol in the Vicodin.

## 2018-03-02 NOTE — ED Notes (Signed)
Pt presents with continued abdominal pain since yesterday. Pt has hx of rheumatoid arthritis; called her rheumatologist this morning, who told her to come back and have a CT scan. Pt states she requested CT scan yesterday and was refused. Pt alert & oriented with NAD noted.

## 2018-03-02 NOTE — ED Triage Notes (Signed)
Patient presents to the ED with generalized abdominal pain since Monday.  Patient was seen in the ED yesterday and diagnosed with a UTI.  Patient was given antibiotics.  Patient states, "this feels like my inflammation and not just a UTI."  Patient reports history of rheumatoid arthritis and abdominal inflammation.  Patient states she called her rheumatologist this am and they told her she should come back to the ED to get a CT scan.  Patient states, "the doctor yesterday refused to do a CT scan."  Patient is rocking back and forth quietly in triage. Patient reports pain is upper central abdominal pain.

## 2018-03-02 NOTE — ED Provider Notes (Addendum)
[facility] Emergency Department Provider Note  ____________________________________________  Time seen: Approximately 3:23 PM  I have reviewed the triage vital signs and the nursing notes.   HISTORY  Chief Complaint Abdominal Pain    HPI Ravonda Brecheen is a 42 y.o. female with a history of leg cellulitis and rheumatoid arthritis who complains of epigastric abdominal pain for the past week. Moderate intensity, aching, no aggravating or alleviating factors. No vomiting or diarrhea or constipation. No fever or chills.  She was seen in the ED for the same complaints by me. I attribute the epigastric pain to her previously identified inflammatory condition, likely worse because she has not yet taken the methotrexate therapy doctor prescribed for her. However, she recontacted her rheumatologist today who recommended she come back to the ED for a CT scan.  She does report that she has been taking the Keflex I prescribed for her yesterday. Her urinary symptoms are improving.      Past Medical History:  Diagnosis Date  . Arthralgia of right knee   . Arthritis   . Cellulitis of left leg   . Lymphadenopathy, mesenteric   . Right hand pain   . Tobacco abuse      Patient Active Problem List   Diagnosis Date Noted  . Hyponatremia 11/25/2016  . Hypokalemia 11/25/2016  . Leukocytosis 11/25/2016  . Generalized weakness 11/25/2016  . Generalized pain 11/25/2016  . Conjunctivitis 11/25/2016  . Scleritis and episcleritis of right eye   . Inflammatory polyarthritis ([place]) 11/23/2016  . Abdominal pain 11/14/2016  . Lymphadenopathy, mesenteric   . Arthralgia of right knee 04/02/2015  . Cellulitis of left leg 03/21/2015  . Right hand pain 03/21/2015  . Tobacco use 03/21/2015     Past Surgical History:  Procedure Laterality Date  . denies       Prior to Admission medications   Medication Sig Start Date End Date Taking? Authorizing Provider  albuterol  (PROVENTIL HFA;VENTOLIN HFA) 108 (90 Base) MCG/ACT inhaler Inhale 2 puffs into the lungs every 6 (six) hours as needed for wheezing or shortness of breath. Patient not taking: Reported on 03/01/2018 07/26/17   Gregor Hams, MD  cephALEXin (KEFLEX) 500 MG capsule Take 1 capsule (500 mg total) by mouth 2 (two) times daily. 03/01/18   Carrie Mew, MD  cyclobenzaprine (FLEXERIL) 10 MG tablet Take 1 tablet (10 mg total) by mouth 3 (three) times daily as needed. Patient not taking: Reported on 03/01/2018 11/19/16   Sable Feil, PA-C  cyclobenzaprine (FLEXERIL) 10 MG tablet Take 1 tablet (10 mg total) by mouth 3 (three) times daily as needed. Patient not taking: Reported on 03/01/2018 07/08/17   Sable Feil, PA-C  folic acid (FOLVITE) 1 MG tablet Take 1 mg by mouth daily. 02/28/18   [provider]  HYDROcodone-acetaminophen (NORCO/VICODIN) 5-325 MG tablet Take 1-2 tablets by mouth every 4 (four) hours as needed for moderate pain. Patient not taking: Reported on 03/01/2018 11/25/16   Theodoro Grist, MD  methotrexate 2.5 MG tablet Take 15 mg by mouth once a week. 02/28/18   [provider]  methylPREDNISolone (MEDROL DOSEPAK) 4 MG TBPK tablet Take Tapered dose as directed Patient not taking: Reported on 03/01/2018 07/08/17   Sable Feil, PA-C  nicotine (NICODERM CQ - DOSED IN MG/24 HOURS) 14 mg/24hr patch Place 1 patch (14 mg total) onto the skin daily. Patient not taking: Reported on 03/01/2018 11/25/16   Theodoro Grist, MD  ondansetron (ZOFRAN ODT) 4 MG disintegrating  tablet Take 1 tablet (4 mg total) by mouth every 8 (eight) hours as needed for nausea or vomiting. 03/01/18   Carrie Mew, MD  prednisoLONE acetate (PRED FORTE) 1 % ophthalmic suspension Place 1 drop into the right eye 5 (five) times daily. Patient not taking: Reported on 03/01/2018 11/25/16   Theodoro Grist, MD  predniSONE (DELTASONE) 20 MG tablet Take 3 tablets (60 mg total) by mouth daily with  breakfast. Patient not taking: Reported on 03/01/2018 11/25/16   Theodoro Grist, MD  traMADol (ULTRAM) 50 MG tablet Take 1 tablet (50 mg total) by mouth every 6 (six) hours as needed for moderate pain. Patient not taking: Reported on 03/01/2018 07/08/17   Sable Feil, PA-C     Allergies Ibuprofen and Sulfasalazine   Family History  Problem Relation Age of Onset  . Leukemia Maternal Grandfather   . Alcohol abuse Father     Social History Social History   Tobacco Use  . Smoking status: Current Every Day Smoker    Packs/day: 0.50    Types: Cigarettes  . Smokeless tobacco: Never Used  Substance Use Topics  . Alcohol use: No  . Drug use: No    Review of Systems  Constitutional:   No fever or chills.  ENT:   No sore throat. No rhinorrhea. Cardiovascular:   No chest pain or syncope. Respiratory:   No dyspnea or cough. Gastrointestinal:  positive as above for abdominal pain without vomiting and diarrhea.  Musculoskeletal:   Negative for focal pain or swelling All other systems reviewed and are negative except as documented above in ROS and HPI.  ____________________________________________   PHYSICAL EXAM:  VITAL SIGNS: ED Triage Vitals  Enc Vitals Group     BP 03/02/18 1142 115/74     Pulse Rate 03/02/18 1142 89     Resp 03/02/18 1142 16     Temp 03/02/18 1142 98.9 F (37.2 C)     Temp Source 03/02/18 1142 Oral     SpO2 03/02/18 1142 100 %     Weight 03/02/18 1144 179 lb 10.8 oz (81.5 kg)     Height 03/02/18 1144 5\' 5"  (1.651 m)     Head Circumference --      Peak Flow --      Pain Score 03/02/18 1144 10     Pain Loc --      Pain Edu? --      Excl. in [place]? --     Vital signs reviewed, nursing assessments reviewed.   Constitutional:   Alert and oriented. Well appearing and in no distress. Eyes:   Conjunctivae are normal. EOMI. PERRL. ENT      Head:   Normocephalic and atraumatic.      Nose:   No congestion/rhinnorhea.       Mouth/Throat:   MMM, no  pharyngeal erythema. No peritonsillar mass.       Neck:   No meningismus. Full ROM. Hematological/Lymphatic/Immunilogical:   No cervical lymphadenopathy. Cardiovascular:   RRR. Symmetric bilateral radial and DP pulses.  No murmurs.  Respiratory:   Normal respiratory effort without tachypnea/retractions. Breath sounds are clear and equal bilaterally. No wheezes/rales/rhonchi. Gastrointestinal:   Soft with epigastric tenderness and suprapubic tenderness. Non distended. There is no CVA tenderness.  No rebound, rigidity, or guarding.  Musculoskeletal:   Normal range of motion in all extremities. No joint effusions.  No lower extremity tenderness.  No edema. Neurologic:   Normal speech and language.  Motor grossly intact. No acute focal  neurologic deficits are appreciated.  Skin:    Skin is warm, dry and intact. No rash noted.  No petechiae, purpura, or bullae.  ____________________________________________    LABS (pertinent positives/negatives) (all labs ordered are listed, but only abnormal results are displayed) Labs Reviewed  COMPREHENSIVE METABOLIC PANEL - Abnormal; Notable for the following components:      Result Value   ALT 8 (*)    All other components within normal limits  CBC - Abnormal; Notable for the following components:   WBC 11.5 (*)    RDW 14.6 (*)    All other components within normal limits  URINALYSIS, COMPLETE (UACMP) WITH MICROSCOPIC - Abnormal; Notable for the following components:   Color, Urine YELLOW (*)    APPearance HAZY (*)    Leukocytes, UA LARGE (*)    All other components within normal limits  LIPASE, BLOOD  POC URINE PREG, ED   ____________________________________________   EKG Interpreted by me Normal sinus rhythm rate of 88, normal axis intervals QRS and ST segments.  Isolated T wave inversion in lead III which is nonspecific.  Unchanged compared to previous EKG Mar 01, 2018.   ____________________________________________     RADIOLOGY  No results found.  ____________________________________________   PROCEDURES Procedures  ____________________________________________  DIFFERENTIAL DIAGNOSIS   abdominal adenitis, Crohn's, gastritis  CLINICAL IMPRESSION / ASSESSMENT AND PLAN / ED COURSE  Pertinent labs & imaging results that were available during my care of the patient were reviewed by me and considered in my medical decision making (see chart for details).    patient well-appearing no acute distress unremarkable vital signs, he turns the ED today for a CT scan at the instruction of her rheumatologist. Her symptoms appear to be due to a combination of cystitis and not yet taking the medication her doctor had already tried to treat her with a couple months ago.  labs remained stable, urinalysis and white blood cell count appears improved compared to yesterday consistent with efficacious treatment of her cystitis. She is still tender on exam. At this point I'll obtain CT scan. Low suspicion for perforation obstruction biliary disease pancreatitis AAA dissection or mesenteric ischemia. IV Pepcid to help her symptoms.      ____________________________________________   FINAL CLINICAL IMPRESSION(S) / ED DIAGNOSES    Final diagnoses:  Epigastric pain  Cystitis     ED Discharge Orders    None      Portions of this note were generated with dragon dictation software. Dictation errors may occur despite best attempts at proofreading.    Carrie Mew, MD 03/02/18 1527    Carrie Mew, MD 03/17/18 [phone]

## 2018-03-03 LAB — URINE CULTURE: Culture: >=100000 — AB

## 2018-04-19 ENCOUNTER — Emergency Department
Admission: EM | Admit: 2018-04-19 | Discharge: 2018-04-19 | Disposition: A | Discharge status: Home / Self Care | Payer: 59 | Attending: Emergency Medicine | Admitting: Emergency Medicine

## 2018-04-19 ENCOUNTER — Other Ambulatory Visit: Payer: Self-pay

## 2018-04-19 ENCOUNTER — Encounter: Payer: Self-pay | Admitting: *Deleted

## 2018-04-19 DIAGNOSIS — M2550 Pain in unspecified joint: Secondary | ICD-10-CM | POA: Insufficient documentation

## 2018-04-19 DIAGNOSIS — F17210 Nicotine dependence, cigarettes, uncomplicated: Secondary | ICD-10-CM | POA: Insufficient documentation

## 2018-04-19 DIAGNOSIS — Z79899 Other long term (current) drug therapy: Secondary | ICD-10-CM | POA: Insufficient documentation

## 2018-04-19 MED ORDER — TRAMADOL HCL 50 MG PO TABS: 50.0000 mg | ORAL_TABLET | Freq: Two times a day (BID) | ORAL | 0 refills | Status: DC | PRN

## 2018-04-19 MED ORDER — METHYLPREDNISOLONE 4 MG PO TBPK: ORAL_TABLET | ORAL | 0 refills | Status: DC

## 2018-04-19 NOTE — ED Provider Notes (Signed)
[facility] Emergency Department Provider Note   ____________________________________________   None    (approximate)  I have reviewed the triage vital signs and the nursing notes.   HISTORY  Chief Complaint Generalized Body Aches    HPI Regina Carpenter is a 42 y.o. female patient complain of joint pain worse with movement.  Patient has history of arthritis and normally is on steroids.  Patient has not follow-up with rheumatology since May of this year.  Patient stated no relief of NSAIDs.  Patient rates the pain as a 10/10.  Patient described the pain is "aching".  Past Medical History:  Diagnosis Date  . Arthralgia of right knee   . Arthritis   . Cellulitis of left leg   . Lymphadenopathy, mesenteric   . Right hand pain   . Tobacco abuse     Patient Active Problem List   Diagnosis Date Noted  . Hyponatremia 11/25/2016  . Hypokalemia 11/25/2016  . Leukocytosis 11/25/2016  . Generalized weakness 11/25/2016  . Generalized pain 11/25/2016  . Conjunctivitis 11/25/2016  . Scleritis and episcleritis of right eye   . 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 Abdominal pain 11/14/2016  . Lymphadenopathy, mesenteric   . Arthralgia of right knee 04/02/2015  . Cellulitis of left leg 03/21/2015  . Right hand pain 03/21/2015  . Tobacco use 03/21/2015    Past Surgical History:  Procedure Laterality Date  . denies      Prior to Admission medications   Medication Sig Start Date End Date Taking? Authorizing Provider  albuterol (PROVENTIL HFA;VENTOLIN HFA) 108 (90 Base) MCG/ACT inhaler Inhale 2 puffs into the lungs every 6 (six) hours as needed for wheezing or shortness of breath. Patient not taking: Reported on 03/01/2018 07/26/17   Gregor Hams, MD  cephALEXin (KEFLEX) 500 MG capsule Take 1 capsule (500 mg total) by mouth 2 (two) times daily. 03/01/18   Carrie Mew, MD  cyclobenzaprine (FLEXERIL) 10 MG tablet Take 1 tablet (10 mg  total) by mouth 3 (three) times daily as needed. Patient not taking: Reported on 03/01/2018 11/19/16   Sable Feil, PA-C  cyclobenzaprine (FLEXERIL) 10 MG tablet Take 1 tablet (10 mg total) by mouth 3 (three) times daily as needed. Patient not taking: Reported on 03/01/2018 07/08/17   Sable Feil, PA-C  folic acid (FOLVITE) 1 MG tablet Take 1 mg by mouth daily. 02/28/18   [provider]  HYDROcodone-acetaminophen (NORCO) 5-325 MG tablet Take 1 tablet by mouth every 6 (six) hours as needed for severe pain. 03/02/18   Nena Polio, MD  HYDROcodone-acetaminophen (NORCO/VICODIN) 5-325 MG tablet Take 1-2 tablets by mouth every 4 (four) hours as needed for moderate pain. Patient not taking: Reported on 03/01/2018 11/25/16   Theodoro Grist, MD  methotrexate 2.5 MG tablet Take 15 mg by mouth once a week. 02/28/18   [provider]  methylPREDNISolone (MEDROL DOSEPAK) 4 MG TBPK tablet Take Tapered dose as directed 04/19/18   Sable Feil, PA-C  nicotine (NICODERM CQ - DOSED IN MG/24 HOURS) 14 mg/24hr patch Place 1 patch (14 mg total) onto the skin daily. Patient not taking: Reported on 03/01/2018 11/25/16   Theodoro Grist, MD  ondansetron (ZOFRAN ODT) 4 MG disintegrating tablet Take 1 tablet (4 mg total) by mouth every 8 (eight) hours as needed for nausea or vomiting. 03/01/18   Carrie Mew, MD  prednisoLONE acetate (PRED FORTE) 1 % ophthalmic suspension Place 1 drop into the right eye 5 (five) times  daily. Patient not taking: Reported on 03/01/2018 11/25/16   Theodoro Grist, MD  predniSONE (DELTASONE) 20 MG tablet Take 3 tablets (60 mg total) by mouth daily with breakfast. Patient not taking: Reported on 03/01/2018 11/25/16   Theodoro Grist, MD  traMADol (ULTRAM) 50 MG tablet Take 1 tablet (50 mg total) by mouth every 6 (six) hours as needed for moderate pain. Patient not taking: Reported on 03/01/2018 07/08/17   Sable Feil, PA-C  traMADol (ULTRAM) 50 MG tablet Take 1 tablet (50  mg total) by mouth every 12 (twelve) hours as needed. 04/19/18   Sable Feil, PA-C    Allergies Ibuprofen and Sulfasalazine  Family History  Problem Relation Age of Onset  . Leukemia Maternal Grandfather   . Alcohol abuse Father     Social History Social History   Tobacco Use  . Smoking status: Current Every Day Smoker    Packs/day: 0.50    Types: Cigarettes  . Smokeless tobacco: Never Used  Substance Use Topics  . Alcohol use: No  . Drug use: No    Review of Systems Constitutional: No fever/chills Eyes: No visual changes. ENT: No sore throat. Cardiovascular: Denies chest pain. Respiratory: Denies shortness of breath. Gastrointestinal: No abdominal pain.  No nausea, no vomiting.  No diarrhea.  No constipation. Genitourinary: Negative for dysuria. Musculoskeletal: Diffuse arthralgia.  skin: Negative for rash. Neurological: Negative for headaches, focal weakness or numbness. Allergic/Immunilogical: See medication list ____________________________________________   PHYSICAL EXAM:  VITAL SIGNS: ED Triage Vitals  Enc Vitals Group     BP 04/19/18 1216 125/79     Pulse Rate 04/19/18 1216 82     Resp 04/19/18 1216 16     Temp 04/19/18 1216 98.3 F (36.8 C)     Temp Source 04/19/18 1216 Oral     SpO2 04/19/18 1216 100 %     Weight 04/19/18 1217 179 lb (81.2 kg)     Height 04/19/18 1217 5\' 5"  (1.651 m)     Head Circumference --      Peak Flow --      Pain Score 04/19/18 1217 10     Pain Loc --      Pain Edu? --      Excl. in [place]? --     Constitutional: Alert and oriented. Well appearing and in no acute distress. Hematological/Lymphatic/Immunilogical: No cervical lymphadenopathy. Cardiovascular: Normal rate, regular rhythm. Grossly normal heart sounds.  Good peripheral circulation. Respiratory: Normal respiratory effort.  No retractions. Lungs CTAB. Musculoskeletal: No lower extremity tenderness nor edema.  No joint effusions. Neurologic:  Normal speech and  language. No gross focal neurologic deficits are appreciated. No gait instability. Skin:  Skin is warm, dry and intact. No rash noted. Psychiatric: Mood and affect are normal. Speech and behavior are normal.  ____________________________________________   LABS (all labs ordered are listed, but only abnormal results are displayed)  Labs Reviewed - No data to display ____________________________________________  EKG   ____________________________________________  RADIOLOGY  ED MD interpretation:    Official radiology report(s): No results found.  ____________________________________________   PROCEDURES  Procedure(s) performed: None  Procedures  Critical Care performed: No  ____________________________________________   INITIAL IMPRESSION / ASSESSMENT AND PLAN / ED COURSE  As part of my medical decision making, I reviewed the following data within the electronic MEDICAL RECORD NUMBER    Arthralgia.  Patient advised to reestablish care with her rheumatologist.  Patient given a prescription for Medrol Dosepak and tramadol.  ____________________________________________   FINAL CLINICAL IMPRESSION(S) / ED DIAGNOSES  Final diagnoses:  Arthralgia, unspecified joint     ED Discharge Orders        Ordered    methylPREDNISolone (MEDROL DOSEPAK) 4 MG TBPK tablet     04/19/18 1235    traMADol (ULTRAM) 50 MG tablet  Every 12 hours PRN     04/19/18 1235       Note:  This document was prepared using Dragon voice recognition software and may include unintentional dictation errors.    Sable Feil, PA-C 04/19/18 1245    Delman Kitten, MD 04/19/18 1425

## 2018-04-19 NOTE — ED Triage Notes (Signed)
Pt reports generalized joint pains that worsen with movement. Pt has a hx of arthritis that she used to be prescribed prednisone for but is no longer taking. Pain had decreased while on the steroid and returned immediately after stopping. PT takes NSAIDs with no relief. Pain is worse int he ankles and knees per pt.

## 2018-07-04 ENCOUNTER — Encounter: Payer: Self-pay | Admitting: Emergency Medicine

## 2018-07-04 ENCOUNTER — Emergency Department
Admission: EM | Admit: 2018-07-04 | Discharge: 2018-07-04 | Disposition: A | Discharge status: Home / Self Care | Payer: Commercial Managed Care - HMO | Attending: Emergency Medicine | Admitting: Emergency Medicine

## 2018-07-04 DIAGNOSIS — Z79899 Other long term (current) drug therapy: Secondary | ICD-10-CM | POA: Diagnosis not present

## 2018-07-04 DIAGNOSIS — R3915 Urgency of urination: Secondary | ICD-10-CM | POA: Diagnosis not present

## 2018-07-04 DIAGNOSIS — M7918 Myalgia, other site: Secondary | ICD-10-CM

## 2018-07-04 DIAGNOSIS — M545 Low back pain: Secondary | ICD-10-CM | POA: Diagnosis not present

## 2018-07-04 DIAGNOSIS — M549 Dorsalgia, unspecified: Secondary | ICD-10-CM | POA: Insufficient documentation

## 2018-07-04 DIAGNOSIS — F17210 Nicotine dependence, cigarettes, uncomplicated: Secondary | ICD-10-CM | POA: Insufficient documentation

## 2018-07-04 DIAGNOSIS — R350 Frequency of micturition: Secondary | ICD-10-CM | POA: Diagnosis present

## 2018-07-04 LAB — URINALYSIS, COMPLETE (UACMP) WITH MICROSCOPIC
Bacteria, UA: NONE SEEN
Bilirubin Urine: NEGATIVE
GLUCOSE, UA: NEGATIVE mg/dL
HGB URINE DIPSTICK: NEGATIVE
Ketones, ur: NEGATIVE mg/dL
NITRITE: NEGATIVE
PROTEIN: NEGATIVE mg/dL
Specific Gravity, Urine: 1.019 (ref 1.005–1.030)
pH: 7 (ref 5.0–8.0)

## 2018-07-04 LAB — CBC
HEMATOCRIT: 35.6 % (ref 35.0–47.0)
Hemoglobin: 12.2 g/dL (ref 12.0–16.0)
MCH: 29 pg (ref 26.0–34.0)
MCHC: 34.2 g/dL (ref 32.0–36.0)
MCV: 84.8 fL (ref 80.0–100.0)
Platelets: 305 10*3/uL (ref 150–440)
RBC: 4.2 MIL/uL (ref 3.80–5.20)
RDW: 14.7 % — AB (ref 11.5–14.5)
WBC: 10.2 10*3/uL (ref 3.6–11.0)

## 2018-07-04 LAB — COMPREHENSIVE METABOLIC PANEL
ALBUMIN: 3.6 g/dL (ref 3.5–5.0)
ALT: 8 U/L (ref 0–44)
ANION GAP: 8 (ref 5–15)
AST: 15 U/L (ref 15–41)
Alkaline Phosphatase: 69 U/L (ref 38–126)
BILIRUBIN TOTAL: 0.5 mg/dL (ref 0.3–1.2)
BUN: 11 mg/dL (ref 6–20)
CO2: 25 mmol/L (ref 22–32)
Calcium: 9.2 mg/dL (ref 8.9–10.3)
Chloride: 105 mmol/L (ref 98–111)
Creatinine, Ser: 0.92 mg/dL (ref 0.44–1.00)
Glucose, Bld: 87 mg/dL (ref 70–99)
POTASSIUM: 3.6 mmol/L (ref 3.5–5.1)
Sodium: 138 mmol/L (ref 135–145)
TOTAL PROTEIN: 7.8 g/dL (ref 6.5–8.1)

## 2018-07-04 MED ORDER — CYCLOBENZAPRINE HCL 10 MG PO TABS: 10.0000 mg | ORAL_TABLET | Freq: Three times a day (TID) | ORAL | 0 refills | Status: DC | PRN

## 2018-07-04 MED ORDER — ONDANSETRON 8 MG PO TBDP
8.0000 mg | ORAL_TABLET | Freq: Once | ORAL | Status: AC
  Administered 2018-07-04: 8 mg via ORAL
  Filled 2018-07-04: qty 1

## 2018-07-04 MED ORDER — ORPHENADRINE CITRATE 30 MG/ML IJ SOLN
60.0000 mg | Freq: Two times a day (BID) | INTRAMUSCULAR | Status: DC
  Administered 2018-07-04: 60 mg via INTRAMUSCULAR
  Filled 2018-07-04: qty 2

## 2018-07-04 MED ORDER — HYDROMORPHONE HCL 1 MG/ML IJ SOLN
1.0000 mg | Freq: Once | INTRAMUSCULAR | Status: AC
  Administered 2018-07-04: 1 mg via INTRAMUSCULAR
  Filled 2018-07-04: qty 1

## 2018-07-04 MED ORDER — TRAMADOL HCL 50 MG PO TABS: 50.0000 mg | ORAL_TABLET | Freq: Two times a day (BID) | ORAL | 0 refills | Status: DC | PRN

## 2018-07-04 NOTE — ED Triage Notes (Signed)
Pt reports pain to her upper and lower back. Pt denies injuries, reports has had muscle spasms in her back before. Pt also reports some urinary frequency and urgency and pain to her back where her kidneys are. Pt denies fevers or other symptoms.

## 2018-07-04 NOTE — ED Provider Notes (Signed)
[facility] Emergency Department Provider Note   ____________________________________________   First MD Initiated Contact with Patient 07/04/18 1154     (approximate)  I have reviewed the triage vital signs and the nursing notes.   HISTORY  Chief Complaint Back Pain and Urinary Frequency    HPI Regina Carpenter is a 42 y.o. female patient presents with upper back pain for 2 days.  Patient denies specific provocative incident for complaint.  Patient also state there is some mild urinary frequency and urgency.  Patient is concerned that she might have a kidney infection/stone.  Patient rates the pain as a 10/10.  Patient described the pain as "spasmatic/achy".  No palliative measures for complaint.  Past Medical History:  Diagnosis Date  . Arthralgia of right knee   . Arthritis   . Cellulitis of left leg   . Lymphadenopathy, mesenteric   . Right hand pain   . Tobacco abuse     Patient Active Problem List   Diagnosis Date Noted  . Hyponatremia 11/25/2016  . Hypokalemia 11/25/2016  . Leukocytosis 11/25/2016  . Generalized weakness 11/25/2016  . Generalized pain 11/25/2016  . Conjunctivitis 11/25/2016  . Scleritis and episcleritis of right eye   . 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 Abdominal pain 11/14/2016  . Lymphadenopathy, mesenteric   . Arthralgia of right knee 04/02/2015  . Cellulitis of left leg 03/21/2015  . Right hand pain 03/21/2015  . Tobacco use 03/21/2015    Past Surgical History:  Procedure Laterality Date  . denies      Prior to Admission medications   Medication Sig Start Date End Date Taking? Authorizing Provider  albuterol (PROVENTIL HFA;VENTOLIN HFA) 108 (90 Base) MCG/ACT inhaler Inhale 2 puffs into the lungs every 6 (six) hours as needed for wheezing or shortness of breath. Patient not taking: Reported on 03/01/2018 07/26/17   Gregor Hams, MD  cephALEXin (KEFLEX) 500 MG capsule Take 1 capsule (500 mg  total) by mouth 2 (two) times daily. 03/01/18   Carrie Mew, MD  cyclobenzaprine (FLEXERIL) 10 MG tablet Take 1 tablet (10 mg total) by mouth 3 (three) times daily as needed. Patient not taking: Reported on 03/01/2018 11/19/16   Sable Feil, PA-C  cyclobenzaprine (FLEXERIL) 10 MG tablet Take 1 tablet (10 mg total) by mouth 3 (three) times daily as needed. Patient not taking: Reported on 03/01/2018 07/08/17   Sable Feil, PA-C  cyclobenzaprine (FLEXERIL) 10 MG tablet Take 1 tablet (10 mg total) by mouth 3 (three) times daily as needed. 07/04/18   Sable Feil, PA-C  folic acid (FOLVITE) 1 MG tablet Take 1 mg by mouth daily. 02/28/18   [provider]  HYDROcodone-acetaminophen (NORCO) 5-325 MG tablet Take 1 tablet by mouth every 6 (six) hours as needed for severe pain. 03/02/18   Nena Polio, MD  HYDROcodone-acetaminophen (NORCO/VICODIN) 5-325 MG tablet Take 1-2 tablets by mouth every 4 (four) hours as needed for moderate pain. Patient not taking: Reported on 03/01/2018 11/25/16   Theodoro Grist, MD  methotrexate 2.5 MG tablet Take 15 mg by mouth once a week. 02/28/18   [provider]  methylPREDNISolone (MEDROL DOSEPAK) 4 MG TBPK tablet Take Tapered dose as directed 04/19/18   Sable Feil, PA-C  nicotine (NICODERM CQ - DOSED IN MG/24 HOURS) 14 mg/24hr patch Place 1 patch (14 mg total) onto the skin daily. Patient not taking: Reported on 03/01/2018 11/25/16   Theodoro Grist, MD  ondansetron (ZOFRAN ODT) 4 MG  disintegrating tablet Take 1 tablet (4 mg total) by mouth every 8 (eight) hours as needed for nausea or vomiting. 03/01/18   Carrie Mew, MD  prednisoLONE acetate (PRED FORTE) 1 % ophthalmic suspension Place 1 drop into the right eye 5 (five) times daily. Patient not taking: Reported on 03/01/2018 11/25/16   Theodoro Grist, MD  predniSONE (DELTASONE) 20 MG tablet Take 3 tablets (60 mg total) by mouth daily with breakfast. Patient not taking: Reported on 03/01/2018  11/25/16   Theodoro Grist, MD  traMADol (ULTRAM) 50 MG tablet Take 1 tablet (50 mg total) by mouth every 6 (six) hours as needed for moderate pain. Patient not taking: Reported on 03/01/2018 07/08/17   Sable Feil, PA-C  traMADol (ULTRAM) 50 MG tablet Take 1 tablet (50 mg total) by mouth every 12 (twelve) hours as needed. 04/19/18   Sable Feil, PA-C  traMADol (ULTRAM) 50 MG tablet Take 1 tablet (50 mg total) by mouth every 12 (twelve) hours as needed. 07/04/18   Sable Feil, PA-C    Allergies Ibuprofen and Sulfasalazine  Family History  Problem Relation Age of Onset  . Leukemia Maternal Grandfather   . Alcohol abuse Father     Social History Social History   Tobacco Use  . Smoking status: Current Every Day Smoker    Packs/day: 0.50    Types: Cigarettes  . Smokeless tobacco: Never Used  Substance Use Topics  . Alcohol use: No  . Drug use: No    Review of Systems  Constitutional: No fever/chills Eyes: No visual changes. ENT: No sore throat. Cardiovascular: Denies chest pain. Respiratory: Denies shortness of breath. Gastrointestinal: No abdominal pain.  No nausea, no vomiting.  No diarrhea.  No constipation. Genitourinary: Negative for dysuria.  Urinary frequency and urgency. Musculoskeletal: Arthritis.. Skin: Negative for rash. Neurological: Negative for headaches, focal weakness or numbness. Allergic/Immunilogical: Ibuprofen ____________________________________________   PHYSICAL EXAM:  VITAL SIGNS: ED Triage Vitals [07/04/18 0925]  Enc Vitals Group     BP 133/86     Pulse Rate 76     Resp 20     Temp 98.3 F (36.8 C)     Temp Source Oral     SpO2 100 %     Weight 180 lb (81.6 kg)     Height 5\' 5"  (1.651 m)     Head Circumference      Peak Flow      Pain Score 10     Pain Loc      Pain Edu?      Excl. in [place]?    Constitutional: Alert and oriented. Well appearing and in no acute distress. Hematological/Lymphatic/Immunilogical: No cervical  lymphadenopathy. Cardiovascular: Normal rate, regular rhythm. Grossly normal heart sounds.  Good peripheral circulation. Respiratory: Normal respiratory effort.  No retractions. Lungs CTAB. Gastrointestinal: Soft and nontender. No distention. No abdominal bruits. No CVA tenderness. Genitourinary: Deferred Musculoskeletal: No obvious deformity to the rectum lumbar spine.  Patient has moderate guarding palpation bilateral scapular muscle group.  Patient has decreased range of motion with flexion and extension of the back complain of pain.  Patient has left paraspinal muscle spasm right lateral movements.  Patient had negative straight leg test in supine position.. Neurologic:  Normal speech and language. No gross focal neurologic deficits are appreciated. No gait instability. Skin:  Skin is warm, dry and intact. No rash noted. Psychiatric: Mood and affect are normal. Speech and behavior are normal.  ____________________________________________   LABS (all labs ordered are listed,  but only abnormal results are displayed)  Labs Reviewed  URINALYSIS, COMPLETE (UACMP) WITH MICROSCOPIC - Abnormal; Notable for the following components:      Result Value   Color, Urine YELLOW (*)    APPearance HAZY (*)    Leukocytes, UA SMALL (*)    All other components within normal limits  CBC - Abnormal; Notable for the following components:   RDW 14.7 (*)    All other components within normal limits  COMPREHENSIVE METABOLIC PANEL   ____________________________________________  EKG   ____________________________________________  RADIOLOGY  ED MD interpretation:    Official radiology report(s): No results found.  ____________________________________________   PROCEDURES  Procedure(s) performed: None  Procedures  Critical Care performed: No  ____________________________________________   INITIAL IMPRESSION / ASSESSMENT AND PLAN / ED COURSE  As part of my medical decision making, I  reviewed the following data within the electronic MEDICAL RECORD NUMBER    Muscle skeletal pain involving the upper and lower back.  Discussed lab results with patient.  Patient given discharge care instructions and a work note.  Patient advised take medication as directed.      ____________________________________________   FINAL CLINICAL IMPRESSION(S) / ED DIAGNOSES  Final diagnoses:  Musculoskeletal pain  Urinary frequency     ED Discharge Orders         Ordered    cyclobenzaprine (FLEXERIL) 10 MG tablet  3 times daily PRN     07/04/18 1207    traMADol (ULTRAM) 50 MG tablet  Every 12 hours PRN     07/04/18 1207           Note:  This document was prepared using Dragon voice recognition software and may include unintentional dictation errors.    Sable Feil, PA-C 07/04/18 1208    Carrie Mew, MD 07/08/18 1346

## 2018-07-04 NOTE — ED Notes (Signed)
See triage note  Having pain to lower back  Describes pain as spasm like  Denies any injury  Ambulates well to treatment

## 2018-10-09 LAB — HM HIV SCREENING LAB: HM HIV Screening: NEGATIVE

## 2018-11-05 ENCOUNTER — Encounter: Payer: Self-pay | Admitting: Emergency Medicine

## 2018-11-05 ENCOUNTER — Emergency Department: Payer: Self-pay

## 2018-11-05 ENCOUNTER — Emergency Department
Admission: EM | Admit: 2018-11-05 | Discharge: 2018-11-05 | Disposition: A | Discharge status: Home / Self Care | Payer: Self-pay | Attending: Student in an Organized Health Care Education/Training Program | Admitting: Student in an Organized Health Care Education/Training Program

## 2018-11-05 DIAGNOSIS — Z79899 Other long term (current) drug therapy: Secondary | ICD-10-CM | POA: Insufficient documentation

## 2018-11-05 DIAGNOSIS — F17210 Nicotine dependence, cigarettes, uncomplicated: Secondary | ICD-10-CM | POA: Insufficient documentation

## 2018-11-05 DIAGNOSIS — N939 Abnormal uterine and vaginal bleeding, unspecified: Secondary | ICD-10-CM | POA: Insufficient documentation

## 2018-11-05 LAB — COMPREHENSIVE METABOLIC PANEL
ALT: 10 U/L (ref 0–44)
AST: 15 U/L (ref 15–41)
Albumin: 4.1 g/dL (ref 3.5–5.0)
Alkaline Phosphatase: 73 U/L (ref 38–126)
Anion gap: 6 (ref 5–15)
BUN: 10 mg/dL (ref 6–20)
CHLORIDE: 104 mmol/L (ref 98–111)
CO2: 25 mmol/L (ref 22–32)
CREATININE: 0.87 mg/dL (ref 0.44–1.00)
Calcium: 9.1 mg/dL (ref 8.9–10.3)
GFR calc Af Amer: >60 mL/min (ref >60)
GFR calc non Af Amer: >60 mL/min (ref >60)
GLUCOSE: 91 mg/dL (ref 70–99)
POTASSIUM: 3.7 mmol/L (ref 3.5–5.1)
Sodium: 135 mmol/L (ref 135–145)
Total Bilirubin: 0.4 mg/dL (ref 0.3–1.2)
Total Protein: 8.1 g/dL (ref 6.5–8.1)

## 2018-11-05 LAB — CBC
HEMATOCRIT: 37.2 % (ref 36.0–46.0)
HEMOGLOBIN: 11.7 g/dL — AB (ref 12.0–15.0)
MCH: 27.3 pg (ref 26.0–34.0)
MCHC: 31.5 g/dL (ref 30.0–36.0)
MCV: 86.9 fL (ref 80.0–100.0)
Platelets: 296 10*3/uL (ref 150–400)
RBC: 4.28 MIL/uL (ref 3.87–5.11)
RDW: 15.6 % — ABNORMAL HIGH (ref 11.5–15.5)
WBC: 10.6 10*3/uL — ABNORMAL HIGH (ref 4.0–10.5)
nRBC: 0 % (ref 0.0–0.2)

## 2018-11-05 LAB — URINALYSIS, COMPLETE (UACMP) WITH MICROSCOPIC
Bilirubin Urine: NEGATIVE
Glucose, UA: NEGATIVE mg/dL
Ketones, ur: NEGATIVE mg/dL
NITRITE: NEGATIVE
PROTEIN: 30 mg/dL — AB
RBC / HPF: >50 RBC/hpf — ABNORMAL HIGH (ref 0–5)
Specific Gravity, Urine: 1.014 (ref 1.005–1.030)
pH: 6 (ref 5.0–8.0)

## 2018-11-05 LAB — POCT PREGNANCY, URINE: PREG TEST UR: NEGATIVE

## 2018-11-05 LAB — LIPASE, BLOOD: LIPASE: 34 U/L (ref 11–51)

## 2018-11-05 MED ORDER — PREDNISONE 10 MG PO TABS: 10.0000 mg | ORAL_TABLET | Freq: Every day | ORAL | 0 refills | Status: DC

## 2018-11-05 NOTE — Discharge Instructions (Addendum)

## 2018-11-05 NOTE — ED Triage Notes (Signed)
Patient with complaint of lower abdominal pain times one week. Patient also states that she has had vaginal bleeding times one week and that it is not time for her period.

## 2018-11-05 NOTE — ED Provider Notes (Signed)
[facility] Emergency Department Provider Note    First MD Initiated Contact with Patient 11/05/18 [phone]     (approximate)  I have reviewed the triage vital signs and the nursing notes.   HISTORY  Chief Complaint Abdominal Pain and Vaginal Bleeding    HPI Regina Carpenter is a 43 y.o. female   presents the ER for evaluation of vaginal bleeding.  States that she is saturating 3 or 4 tampons or pads daily for the past 4 days.  Had a normal menstrual cycle at the beginning of this month.  This 1 is early and irregular she says.  States she is also having some crampy abdominal pain.  Denies any dysuria.  Denies any discharge.  Does not have OB/GYN.  States that she is also having swelling in her hands and ankles consistent with her chronic arthritis but has run out of her steroids.  Would like referral to OB/GYN as well as PCP.   Past Medical History:  Diagnosis Date  . Arthralgia of right knee   . Arthritis   . Cellulitis of left leg   . Lymphadenopathy, mesenteric   . Right hand pain   . Tobacco abuse    Family History  Problem Relation Age of Onset  . Leukemia Maternal Grandfather   . Alcohol abuse Father    Past Surgical History:  Procedure Laterality Date  . denies     Patient Active Problem List   Diagnosis Date Noted  . Hyponatremia 11/25/2016  . Hypokalemia 11/25/2016  . Leukocytosis 11/25/2016  . Generalized weakness 11/25/2016  . Generalized pain 11/25/2016  . Conjunctivitis 11/25/2016  . Scleritis and episcleritis of right eye   . 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 Abdominal pain 11/14/2016  . Lymphadenopathy, mesenteric   . Arthralgia of right knee 04/02/2015  . Cellulitis of left leg 03/21/2015  . Right hand pain 03/21/2015  . Tobacco use 03/21/2015      Prior to Admission medications   Medication Sig Start Date End Date Taking? Authorizing Provider  albuterol (PROVENTIL HFA;VENTOLIN HFA) 108 (90 Base) MCG/ACT  inhaler Inhale 2 puffs into the lungs every 6 (six) hours as needed for wheezing or shortness of breath. Patient not taking: Reported on 03/01/2018 07/26/17   Gregor Hams, MD  cephALEXin (KEFLEX) 500 MG capsule Take 1 capsule (500 mg total) by mouth 2 (two) times daily. 03/01/18   Carrie Mew, MD  cyclobenzaprine (FLEXERIL) 10 MG tablet Take 1 tablet (10 mg total) by mouth 3 (three) times daily as needed. Patient not taking: Reported on 03/01/2018 11/19/16   Sable Feil, PA-C  cyclobenzaprine (FLEXERIL) 10 MG tablet Take 1 tablet (10 mg total) by mouth 3 (three) times daily as needed. Patient not taking: Reported on 03/01/2018 07/08/17   Sable Feil, PA-C  cyclobenzaprine (FLEXERIL) 10 MG tablet Take 1 tablet (10 mg total) by mouth 3 (three) times daily as needed. 07/04/18   Sable Feil, PA-C  folic acid (FOLVITE) 1 MG tablet Take 1 mg by mouth daily. 02/28/18   [provider]  HYDROcodone-acetaminophen (NORCO) 5-325 MG tablet Take 1 tablet by mouth every 6 (six) hours as needed for severe pain. 03/02/18   Nena Polio, MD  HYDROcodone-acetaminophen (NORCO/VICODIN) 5-325 MG tablet Take 1-2 tablets by mouth every 4 (four) hours as needed for moderate pain. Patient not taking: Reported on 03/01/2018 11/25/16   Theodoro Grist, MD  methotrexate 2.5 MG tablet Take 15 mg by mouth once a  week. 02/28/18   [provider]  methylPREDNISolone (MEDROL DOSEPAK) 4 MG TBPK tablet Take Tapered dose as directed 04/19/18   Sable Feil, PA-C  nicotine (NICODERM CQ - DOSED IN MG/24 HOURS) 14 mg/24hr patch Place 1 patch (14 mg total) onto the skin daily. Patient not taking: Reported on 03/01/2018 11/25/16   Theodoro Grist, MD  ondansetron (ZOFRAN ODT) 4 MG disintegrating tablet Take 1 tablet (4 mg total) by mouth every 8 (eight) hours as needed for nausea or vomiting. 03/01/18   Carrie Mew, MD  prednisoLONE acetate (PRED FORTE) 1 % ophthalmic suspension Place 1 drop into the  right eye 5 (five) times daily. Patient not taking: Reported on 03/01/2018 11/25/16   Theodoro Grist, MD  predniSONE (DELTASONE) 20 MG tablet Take 3 tablets (60 mg total) by mouth daily with breakfast. Patient not taking: Reported on 03/01/2018 11/25/16   Theodoro Grist, MD  traMADol (ULTRAM) 50 MG tablet Take 1 tablet (50 mg total) by mouth every 6 (six) hours as needed for moderate pain. Patient not taking: Reported on 03/01/2018 07/08/17   Sable Feil, PA-C  traMADol (ULTRAM) 50 MG tablet Take 1 tablet (50 mg total) by mouth every 12 (twelve) hours as needed. 04/19/18   Sable Feil, PA-C  traMADol (ULTRAM) 50 MG tablet Take 1 tablet (50 mg total) by mouth every 12 (twelve) hours as needed. 07/04/18   Sable Feil, PA-C    Allergies Ibuprofen and Sulfasalazine    Social History Social History   Tobacco Use  . Smoking status: Current Every Day Smoker    Packs/day: 0.50    Types: Cigarettes  . Smokeless tobacco: Never Used  Substance Use Topics  . Alcohol use: No  . Drug use: No    Review of Systems Patient denies headaches, rhinorrhea, blurry vision, numbness, shortness of breath, chest pain, edema, cough, abdominal pain, nausea, vomiting, diarrhea, dysuria, fevers, rashes or hallucinations unless otherwise stated above in HPI. ____________________________________________   PHYSICAL EXAM:  VITAL SIGNS: Vitals:   11/05/18 0159  BP: 134/89  Pulse: 96  Resp: 18  Temp: 98.3 F (36.8 C)  SpO2: 100%    Constitutional: Alert and oriented.  Eyes: Conjunctivae are normal.  Head: Atraumatic. Nose: No congestion/rhinnorhea. Mouth/Throat: Mucous membranes are moist.   Neck: No stridor. Painless ROM.  Cardiovascular: Normal rate, regular rhythm. Grossly normal heart sounds.  Good peripheral circulation. Respiratory: Normal respiratory effort.  No retractions. Lungs CTAB. Gastrointestinal: Soft and nontender. No distention. No abdominal bruits. No CVA  tenderness. Genitourinary: Normal vaginal pelvic exam.  No cervical motion tenderness.  No masses or lesions noted. Musculoskeletal: No lower extremity tenderness nor edema.  No joint effusions. Neurologic:  Normal speech and language. No gross focal neurologic deficits are appreciated. No facial droop Skin:  Skin is warm, dry and intact. No rash noted. Psychiatric: Mood and affect are normal. Speech and behavior are normal.  ____________________________________________   LABS (all labs ordered are listed, but only abnormal results are displayed)  Results for orders placed or performed during the hospital encounter of 11/05/18 (from the past 24 hour(s))  Lipase, blood     Status: None   Collection Time: 11/05/18  2:02 AM  Result Value Ref Range   Lipase 34 11 - 51 U/L  Comprehensive metabolic panel     Status: None   Collection Time: 11/05/18  2:02 AM  Result Value Ref Range   Sodium 135 135 - 145 mmol/L   Potassium 3.7 3.5 -  5.1 mmol/L   Chloride 104 98 - 111 mmol/L   CO2 25 22 - 32 mmol/L   Glucose, Bld 91 70 - 99 mg/dL   BUN 10 6 - 20 mg/dL   Creatinine, Ser 0.87 0.44 - 1.00 mg/dL   Calcium 9.1 8.9 - 10.3 mg/dL   Total Protein 8.1 6.5 - 8.1 g/dL   Albumin 4.1 3.5 - 5.0 g/dL   AST 15 15 - 41 U/L   ALT 10 0 - 44 U/L   Alkaline Phosphatase 73 38 - 126 U/L   Total Bilirubin 0.4 0.3 - 1.2 mg/dL   GFR calc non Af Amer >60 >60 mL/min   GFR calc Af Amer >60 >60 mL/min   Anion gap 6 5 - 15  CBC     Status: Abnormal   Collection Time: 11/05/18  2:02 AM  Result Value Ref Range   WBC 10.6 (H) 4.0 - 10.5 K/uL   RBC 4.28 3.87 - 5.11 MIL/uL   Hemoglobin 11.7 (L) 12.0 - 15.0 g/dL   HCT 37.2 36.0 - 46.0 %   MCV 86.9 80.0 - 100.0 fL   MCH 27.3 26.0 - 34.0 pg   MCHC 31.5 30.0 - 36.0 g/dL   RDW 15.6 (H) 11.5 - 15.5 %   Platelets 296 150 - 400 K/uL   nRBC 0.0 0.0 - 0.2 %  Urinalysis, Complete w Microscopic     Status: Abnormal   Collection Time: 11/05/18  2:02 AM  Result Value Ref  Range   Color, Urine YELLOW (A) YELLOW   APPearance CLEAR (A) CLEAR   Specific Gravity, Urine 1.014 1.005 - 1.030   pH 6.0 5.0 - 8.0   Glucose, UA NEGATIVE NEGATIVE mg/dL   Hgb urine dipstick LARGE (A) NEGATIVE   Bilirubin Urine NEGATIVE NEGATIVE   Ketones, ur NEGATIVE NEGATIVE mg/dL   Protein, ur 30 (A) NEGATIVE mg/dL   Nitrite NEGATIVE NEGATIVE   Leukocytes, UA TRACE (A) NEGATIVE   RBC / HPF >50 (H) 0 - 5 RBC/hpf   WBC, UA 21-50 0 - 5 WBC/hpf   Bacteria, UA RARE (A) NONE SEEN   Squamous Epithelial / LPF 6-10 0 - 5   Mucus PRESENT   Pregnancy, urine POC     Status: None   Collection Time: 11/05/18  2:13 AM  Result Value Ref Range   Preg Test, Ur NEGATIVE NEGATIVE   ____________________________________________ ____________________________________________  RADIOLOGY  I personally reviewed all radiographic images ordered to evaluate for the above acute complaints and reviewed radiology reports and findings.  These findings were personally discussed with the patient.  Please see medical record for radiology report.  ____________________________________________   PROCEDURES  Procedure(s) performed:  Procedures    Critical Care performed: no ____________________________________________   INITIAL IMPRESSION / ASSESSMENT AND PLAN / ED COURSE  Pertinent labs & imaging results that were available during my care of the patient were reviewed by me and considered in my medical decision making (see chart for details).   DDX: DU B, ABD, mass, UTI, arthritis  Kristyanna Barcelo is a 43 y.o. who presents to the ED with symptoms as described above.  Patient not pregnant.  Otherwise hemodynamically stable and well-appearing.  Exam as above.  Will perform pelvic exam.  Clinical Course as of Nov 06 1203  Nancy Fetter Nov 05, 2018  0820 Pelvic exam with trace blood.  No evidence of infection.  No evidence of cervicitis or polyps.  Will order ultrasound to further evaluate.   [PR]  1022 Vaginal  ultrasound is normal.  At this point patient stable and appropriate for outpatient follow-up.  Will give referral to OB/GYN.   [PR]    Clinical Course User Index [PR] Merlyn Lot, MD     As part of my medical decision making, I reviewed the following data within the [place] notes reviewed and incorporated, Labs reviewed, notes from prior ED visits and [place] Controlled Substance Database   ____________________________________________   FINAL CLINICAL IMPRESSION(S) / ED DIAGNOSES  Final diagnoses:  Vaginal bleeding      NEW MEDICATIONS STARTED DURING THIS VISIT:  New Prescriptions   No medications on file     Note:  This document was prepared using Dragon voice recognition software and may include unintentional dictation errors.    Merlyn Lot, MD 11/05/18 [phone]

## 2018-12-06 ENCOUNTER — Other Ambulatory Visit: Payer: Self-pay

## 2018-12-06 ENCOUNTER — Emergency Department
Admission: EM | Admit: 2018-12-06 | Discharge: 2018-12-06 | Disposition: A | Discharge status: Home / Self Care | Payer: Self-pay | Attending: Emergency Medicine | Admitting: Emergency Medicine

## 2018-12-06 ENCOUNTER — Encounter: Payer: Self-pay | Admitting: Emergency Medicine

## 2018-12-06 DIAGNOSIS — M13842 Other specified arthritis, left hand: Secondary | ICD-10-CM | POA: Insufficient documentation

## 2018-12-06 DIAGNOSIS — F17210 Nicotine dependence, cigarettes, uncomplicated: Secondary | ICD-10-CM | POA: Insufficient documentation

## 2018-12-06 DIAGNOSIS — M1990 Unspecified osteoarthritis, unspecified site: Secondary | ICD-10-CM

## 2018-12-06 DIAGNOSIS — M13872 Other specified arthritis, left ankle and foot: Secondary | ICD-10-CM | POA: Insufficient documentation

## 2018-12-06 MED ORDER — PREDNISONE 5 MG PO TABS: 5.0000 mg | ORAL_TABLET | Freq: Every day | ORAL | 0 refills | Status: DC

## 2018-12-06 NOTE — ED Notes (Signed)
See triage note  Presents with pain to left hand and left foot  States she has swelling to left hand all the time  But states pain has increased   Denies any injury  Has been treated for arthritis in past  But is currently out of meds  Has tried Aleve and arthritis strength tylenol  No relief

## 2018-12-06 NOTE — ED Triage Notes (Signed)
Pt in with co left hand and left foot pain states no injury. States does have rheumatoid arthritis and pain is similar.

## 2018-12-06 NOTE — ED Provider Notes (Signed)
[facility] Emergency Department Provider Note  ____________________________________________   First MD Initiated Contact with Patient 12/06/18 [phone]     (approximate)  I have reviewed the triage vital signs and the nursing notes.   HISTORY  Chief Complaint Hand Pain   HPI Regina Carpenter is a 43 y.o. female presents to the ED with complaint of left hand pain and left foot pain.  Patient states that she has been seen by Dr. Lyndon Code for inflammatory arthritis and in the past has been taking methotrexate as well as prednisone.  Currently she is not taking the methotrexate but has a refill on it.  She states that she is out of prednisone which she takes for "this type of pain".  She denies any injury.  Currently she rates her pain as 9/10.       Past Medical History:  Diagnosis Date  . Arthralgia of right knee   . Arthritis   . Cellulitis of left leg   . Lymphadenopathy, mesenteric   . Right hand pain   . Tobacco abuse     Patient Active Problem List   Diagnosis Date Noted  . Hyponatremia 11/25/2016  . Hypokalemia 11/25/2016  . Leukocytosis 11/25/2016  . Generalized weakness 11/25/2016  . Generalized pain 11/25/2016  . Conjunctivitis 11/25/2016  . Scleritis and episcleritis of right eye   . 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 Abdominal pain 11/14/2016  . Lymphadenopathy, mesenteric   . Arthralgia of right knee 04/02/2015  . Cellulitis of left leg 03/21/2015  . Right hand pain 03/21/2015  . Tobacco use 03/21/2015    Past Surgical History:  Procedure Laterality Date  . denies      Prior to Admission medications   Medication Sig Start Date End Date Taking? Authorizing Provider  predniSONE (DELTASONE) 5 MG tablet Take 1 tablet (5 mg total) by mouth daily with breakfast. 12/06/18   Johnn Hai, PA-C    Allergies Ibuprofen and Sulfasalazine  Family History  Problem Relation Age of Onset  . Leukemia Maternal  Grandfather   . Alcohol abuse Father     Social History Social History   Tobacco Use  . Smoking status: Current Every Day Smoker    Packs/day: 0.50    Types: Cigarettes  . Smokeless tobacco: Never Used  Substance Use Topics  . Alcohol use: No  . Drug use: No    Review of Systems Constitutional: No fever/chills Cardiovascular: Denies chest pain. Respiratory: Denies shortness of breath. Musculoskeletal: Positive for left hand pain and left foot pain. Skin: Negative for rash. Neurological: Negative for headaches, focal weakness or numbness. ___________________________________________   PHYSICAL EXAM:  VITAL SIGNS: ED Triage Vitals  Enc Vitals Group     BP 12/06/18 0609 130/77     Pulse Rate 12/06/18 0609 84     Resp 12/06/18 0609 20     Temp 12/06/18 0609 98.2 F (36.8 C)     Temp Source 12/06/18 0609 Oral     SpO2 12/06/18 0609 100 %     Weight 12/06/18 0608 175 lb (79.4 kg)     Height 12/06/18 0608 5\' 5"  (1.651 m)     Head Circumference --      Peak Flow --      Pain Score 12/06/18 0608 9     Pain Loc --      Pain Edu? --      Excl. in [place]? --     Constitutional: Alert and oriented. Well appearing  and in no acute distress. Eyes: Conjunctivae are normal.  Head: Atraumatic. Neck: No stridor.   Cardiovascular: Normal rate, regular rhythm. Grossly normal heart sounds.  Good peripheral circulation. Respiratory: Normal respiratory effort.  No retractions. Lungs CTAB. Gastrointestinal: Soft and nontender. No distention.  Musculoskeletal: On examination of the left hand there is no gross deformity however there is some diffuse tenderness on palpation of the dorsal aspect.  There is no gross deformity of the joints and patient is able to slowly flex and extend her digits.  Capillary refill is less than 3 seconds.  Skin is intact.  There is no gross deformity on examination of the left foot. Neurologic:  Normal speech and language. No gross focal neurologic deficits are  appreciated.  Skin:  Skin is warm, dry and intact. No rash noted. Psychiatric: Mood and affect are normal. Speech and behavior are normal.  ____________________________________________   LABS (all labs ordered are listed, but only abnormal results are displayed)  Labs Reviewed - No data to display   PROCEDURES  Procedure(s) performed (including Critical Care):  Procedures   ____________________________________________   INITIAL IMPRESSION / ASSESSMENT AND PLAN / ED COURSE  As part of my medical decision making, I reviewed the following data within the electronic MEDICAL RECORD NUMBER Notes from prior ED visits and [place] Controlled Substance Database  Patient presents to the ED with complaint of left hand and foot pain.  In looking through her records she has seen Dr. Precious Reel and has been on methotrexate and prednisone in the past.  She states that she has a refill of her methotrexate but is out of prednisone.  No injury has occurred and x-rays were deferred.  Patient was given a prescription for prednisone 5 mg 1 daily #30.  She is to follow-up with Dr. Precious Reel and take medications as directed.  ____________________________________________   FINAL CLINICAL IMPRESSION(S) / ED DIAGNOSES  Final diagnoses:  Inflammatory arthritis     ED Discharge Orders         Ordered    predniSONE (DELTASONE) 5 MG tablet  Daily with breakfast     12/06/18 0819           Note:  This document was prepared using Dragon voice recognition software and may include unintentional dictation errors.    Johnn Hai, PA-C 12/06/18 1557    Lavonia Drafts, MD 12/07/18 [phone]

## 2018-12-06 NOTE — Discharge Instructions (Addendum)
Call make an appointment with Dr. Jefm Bryant.  Your medication was sent to your pharmacy.  Also take your methotrexate as prescribed by your rheumatologist.

## 2018-12-24 ENCOUNTER — Emergency Department
Admission: EM | Admit: 2018-12-24 | Discharge: 2018-12-24 | Disposition: A | Discharge status: Home / Self Care | Payer: Self-pay | Attending: Emergency Medicine | Admitting: Emergency Medicine

## 2018-12-24 ENCOUNTER — Other Ambulatory Visit: Payer: Self-pay

## 2018-12-24 ENCOUNTER — Encounter: Payer: Self-pay | Admitting: Emergency Medicine

## 2018-12-24 DIAGNOSIS — J069 Acute upper respiratory infection, unspecified: Secondary | ICD-10-CM | POA: Insufficient documentation

## 2018-12-24 DIAGNOSIS — F17210 Nicotine dependence, cigarettes, uncomplicated: Secondary | ICD-10-CM | POA: Insufficient documentation

## 2018-12-24 MED ORDER — BENZONATATE 200 MG PO CAPS: 200.0000 mg | ORAL_CAPSULE | Freq: Three times a day (TID) | ORAL | 0 refills | Status: DC | PRN

## 2018-12-24 MED ORDER — AZITHROMYCIN 250 MG PO TABS: ORAL_TABLET | ORAL | 0 refills | Status: DC

## 2018-12-24 NOTE — ED Provider Notes (Signed)
[facility] Emergency Department Provider Note  ____________________________________________   First MD Initiated Contact with Patient 12/24/18 1553     (approximate)  I have reviewed the triage vital signs and the nursing notes.   HISTORY  Chief Complaint Cough    HPI Regina Carpenter is a 43 y.o. female presents to the emergency department with URI symptoms.  Is complaining of cough, congestion, fever, chills, denies chest pain, denies shortness of breath, states she has been coughing up yellow and green mucus. No recent travel, denies close contact with Covid 19+ patient,    Past Medical History:  Diagnosis Date  . Arthralgia of right knee   . Arthritis   . Cellulitis of left leg   . Lymphadenopathy, mesenteric   . Right hand pain   . Tobacco abuse     Patient Active Problem List   Diagnosis Date Noted  . Hyponatremia 11/25/2016  . Hypokalemia 11/25/2016  . Leukocytosis 11/25/2016  . Generalized weakness 11/25/2016  . Generalized pain 11/25/2016  . Conjunctivitis 11/25/2016  . Scleritis and episcleritis of right eye   . 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 Abdominal pain 11/14/2016  . Lymphadenopathy, mesenteric   . Arthralgia of right knee 04/02/2015  . Cellulitis of left leg 03/21/2015  . Right hand pain 03/21/2015  . Tobacco use 03/21/2015    Past Surgical History:  Procedure Laterality Date  . denies      Prior to Admission medications   Medication Sig Start Date End Date Taking? Authorizing Provider  azithromycin (ZITHROMAX Z-PAK) 250 MG tablet 2 pills today then 1 pill a day for 4 days 12/24/18   Caryn Section Linden Dolin, PA-C  benzonatate (TESSALON) 200 MG capsule Take 1 capsule (200 mg total) by mouth 3 (three) times daily as needed for cough. 12/24/18   Caryn Section Linden Dolin, PA-C  predniSONE (DELTASONE) 5 MG tablet Take 1 tablet (5 mg total) by mouth daily with breakfast. 12/06/18   Johnn Hai, PA-C    Allergies  Ibuprofen and Sulfasalazine  Family History  Problem Relation Age of Onset  . Leukemia Maternal Grandfather   . Alcohol abuse Father     Social History Social History   Tobacco Use  . Smoking status: Current Every Day Smoker    Packs/day: 0.50    Types: Cigarettes  . Smokeless tobacco: Never Used  Substance Use Topics  . Alcohol use: No  . Drug use: No    Review of Systems  Constitutional: Complains of fever/chills Eyes: No visual changes. ENT: Complains of sore throat. Respiratory: Complains of cough Genitourinary: Negative for dysuria. Musculoskeletal: Negative for back pain. Skin: Negative for rash.    ____________________________________________   PHYSICAL EXAM:  VITAL SIGNS: ED Triage Vitals  Enc Vitals Group     BP 12/24/18 1542 109/84     Pulse Rate 12/24/18 1542 96     Resp 12/24/18 1542 18     Temp 12/24/18 1542 98.7 F (37.1 C)     Temp Source 12/24/18 1542 Oral     SpO2 12/24/18 1542 100 %     Weight 12/24/18 1543 170 lb (77.1 kg)     Height 12/24/18 1543 5\' 5"  (1.651 m)     Head Circumference --      Peak Flow --      Pain Score 12/24/18 1543 10     Pain Loc --      Pain Edu? --      Excl. in [place]? --  Constitutional: Alert and oriented. Well appearing and in no acute distress. Eyes: Conjunctivae are normal.  Head: Atraumatic. Ears: TMs are clear bilaterally Nose: No congestion/rhinnorhea. Mouth/Throat: Mucous membranes are moist.  Throat appears normal Neck:  supple no lymphadenopathy noted Cardiovascular: Normal rate, regular rhythm. Heart sounds are normal Respiratory: Normal respiratory effort.  No retractions, lungs clear to auscultation GU: deferred Musculoskeletal: FROM all extremities, warm and well perfused Neurologic:  Normal speech and language.  Skin:  Skin is warm, dry and intact. No rash noted. Psychiatric: Mood and affect are normal. Speech and behavior are normal.  ____________________________________________    LABS (all labs ordered are listed, but only abnormal results are displayed)  Labs Reviewed - No data to display ____________________________________________   ____________________________________________  RADIOLOGY    ____________________________________________   PROCEDURES  Procedure(s) performed: No  Procedures    ____________________________________________   INITIAL IMPRESSION / ASSESSMENT AND PLAN / ED COURSE  Pertinent labs & imaging results that were available during my care of the patient were reviewed by me and considered in my medical decision making (see chart for details).   Patient is a 43 year old female who complains of URI symptoms.  Basic medical exam was performed. The patient was instructed to quarantine themselves at home.  Follow-up with your regular doctor if any concerns.  Return emergency department for worsening.  Due to is a green to yellow mucus she was given a prescription for Z-Pak and Tessalon Perles.     Patient's vital signs are stable.  This patient currently does not meet criteria for testing and I explained that in detail to the patient.  The evaluation today is reassuring with no evidence of emergent medical condition that requires further work-up or evaluation or inpatient treatment.  I provided follow-up recommendations and strict return precautions.  The patient understands and agrees with the plan.  As part of my medical decision making, I reviewed the following data within the [place] notes reviewed and incorporated, Old chart reviewed, Notes from prior ED visits and [place] Controlled Substance Database  ____________________________________________   FINAL CLINICAL IMPRESSION(S) / ED DIAGNOSES  Final diagnoses:  Acute upper respiratory infection      NEW MEDICATIONS STARTED DURING THIS VISIT:  New Prescriptions   AZITHROMYCIN (ZITHROMAX Z-PAK) 250 MG TABLET    2 pills today then 1 pill a day for 4  days   BENZONATATE (TESSALON) 200 MG CAPSULE    Take 1 capsule (200 mg total) by mouth 3 (three) times daily as needed for cough.     Note:  This document was prepared using Dragon voice recognition software and may include unintentional dictation errors.    Versie Starks, PA-C 12/24/18 1606    Hinda Kehr, MD 12/24/18 [phone]

## 2018-12-24 NOTE — ED Triage Notes (Signed)
Cough congestion x4 days , chest pain with coughing spells ,yellow sputum

## 2019-03-15 ENCOUNTER — Encounter: Payer: Self-pay | Admitting: Emergency Medicine

## 2019-03-15 ENCOUNTER — Other Ambulatory Visit: Payer: Self-pay

## 2019-03-15 ENCOUNTER — Emergency Department
Admission: EM | Admit: 2019-03-15 | Discharge: 2019-03-15 | Disposition: A | Discharge status: Home / Self Care | Payer: Self-pay | Attending: Emergency Medicine | Admitting: Emergency Medicine

## 2019-03-15 DIAGNOSIS — L0201 Cutaneous abscess of face: Secondary | ICD-10-CM | POA: Insufficient documentation

## 2019-03-15 DIAGNOSIS — F17210 Nicotine dependence, cigarettes, uncomplicated: Secondary | ICD-10-CM | POA: Insufficient documentation

## 2019-03-15 MED ORDER — LIDOCAINE HCL (PF) 1 % IJ SOLN
INTRAMUSCULAR | Status: AC
  Filled 2019-03-15: qty 5

## 2019-03-15 MED ORDER — CEPHALEXIN 500 MG PO CAPS: 500.0000 mg | ORAL_CAPSULE | Freq: Four times a day (QID) | ORAL | 0 refills | Status: AC

## 2019-03-15 MED ORDER — LIDOCAINE HCL (PF) 1 % IJ SOLN
5.0000 mL | Freq: Once | INTRAMUSCULAR | Status: AC
  Administered 2019-03-15: 5 mL

## 2019-03-15 MED ORDER — OXYCODONE-ACETAMINOPHEN 7.5-325 MG PO TABS: 1.0000 | ORAL_TABLET | Freq: Four times a day (QID) | ORAL | 0 refills | Status: DC | PRN

## 2019-03-15 NOTE — ED Triage Notes (Signed)
Pt reports has a bump or abscess on her face that has been there for about a week. Pt states it started out small and has gotten bigger. Denies fevers.

## 2019-03-15 NOTE — ED Provider Notes (Signed)
[facility] EMERGENCY DEPARTMENT Provider Note   CSN: [id number] Arrival date & time: 03/15/19  8295     History   Chief Complaint Chief Complaint  Patient presents with  . Abscess    HPI Regina Carpenter is a 43 y.o. female.     HPI Patient presents for facial abscess anterior left superior nasal area.  Patient state lesion has increased in size in the past week.  Patient wears nasal piercing.  Patient denies drainage.  Patient denies fever chills associated complaint.  Patient rates pain as a 8/10.  Patient described pain as "achy".  No palliative measure for complaint. Past Medical History:  Diagnosis Date  . Arthralgia of right knee   . Arthritis   . Cellulitis of left leg   . Lymphadenopathy, mesenteric   . Right hand pain   . Tobacco abuse     Patient Active Problem List   Diagnosis Date Noted  . Hyponatremia 11/25/2016  . Hypokalemia 11/25/2016  . Leukocytosis 11/25/2016  . Generalized weakness 11/25/2016  . Generalized pain 11/25/2016  . Conjunctivitis 11/25/2016  . Scleritis and episcleritis of right eye   . 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 Abdominal pain 11/14/2016  . Lymphadenopathy, mesenteric   . Arthralgia of right knee 04/02/2015  . Cellulitis of left leg 03/21/2015  . Right hand pain 03/21/2015  . Tobacco use 03/21/2015    Past Surgical History:  Procedure Laterality Date  . denies       OB History   No obstetric history on file.      Home Medications    Prior to Admission medications   Medication Sig Start Date End Date Taking? Authorizing Provider  predniSONE (DELTASONE) 5 MG tablet Take 5 mg by mouth daily with breakfast.   Yes [provider]    Family History Family History  Problem Relation Age of Onset  . Leukemia Maternal Grandfather   . Alcohol abuse Father     Social History Social History   Tobacco Use  . Smoking status: Current Every Day Smoker    Packs/day: 0.50   Types: Cigarettes  . Smokeless tobacco: Never Used  Substance Use Topics  . Alcohol use: No  . Drug use: No     Allergies   Ibuprofen and Sulfasalazine   Review of Systems Review of Systems   Physical Exam Updated Vital Signs BP (!) 130/91 (BP Location: Left Arm)   Pulse 100   Temp 98.7 F (37.1 C) (Oral)   Resp 20   Ht 5\' 5"  (1.651 m)   Wt 83.9 kg   LMP 03/12/2019 (Exact Date)   SpO2 98%   BMI 30.79 kg/m   Physical Exam Patient appears in no acute distress.  Does not nodule lesion superior left lateral aspect of anterior nasal area.  Area is fluctuant.  ED Treatments / Results  Labs (all labs ordered are listed, but only abnormal results are displayed) Labs Reviewed - No data to display  EKG    Radiology No results found.  Procedures .Marland KitchenIncision and Drainage  Date/Time: 03/15/2019 10:00 AM Performed by: Sable Feil, PA-C Authorized by: Sable Feil, PA-C   Consent:    Consent obtained:  Verbal   Consent given by:  Patient   Risks discussed:  Bleeding, incomplete drainage and pain Location:    Type:  Abscess   Location:  Head   Head location:  Nose Pre-procedure details:    Skin preparation:  Betadine Anesthesia (see MAR for  exact dosages):    Anesthesia method:  Local infiltration   Local anesthetic:  Lidocaine 1% w/o epi Procedure type:    Complexity:  Simple Procedure details:    Incision types:  Stab incision   Scalpel blade:  11   Drainage:  Purulent   Drainage amount:  Scant   Wound treatment:  Wound left open   Packing materials:  None Post-procedure details:    Patient tolerance of procedure:  Tolerated well, no immediate complications   (including critical care time)  Medications Ordered in ED Medications - No data to display   Initial Impression / Assessment and Plan / ED Course  I have reviewed the triage vital signs and the nursing notes.  Pertinent labs & imaging results that were available during my care of the  patient were reviewed by me and considered in my medical decision making (see chart for details).        Facial pain secondary to an anterior nasal abscess.  Discussed with patient rationale for for needle aspiration/incision and drainage.  Final Clinical Impressions(s) / ED Diagnoses   Final diagnoses:  None   Facial abscess ED Discharge Orders    None       Hewitt Blade 03/15/19 1002    Carrie Mew, MD 03/15/19 1434

## 2019-03-15 NOTE — ED Notes (Signed)
See triage note  Presents with possible abscess area to left side of nose  States she felt area couple of days ago  Now area is larger and she  has some swelling to left side of face

## 2019-06-23 ENCOUNTER — Emergency Department: Payer: Self-pay

## 2019-06-23 ENCOUNTER — Emergency Department
Admission: EM | Admit: 2019-06-23 | Discharge: 2019-06-23 | Disposition: A | Discharge status: Home / Self Care | Payer: Self-pay | Attending: Emergency Medicine | Admitting: Emergency Medicine

## 2019-06-23 ENCOUNTER — Other Ambulatory Visit: Payer: Self-pay

## 2019-06-23 ENCOUNTER — Encounter: Payer: Self-pay | Admitting: Emergency Medicine

## 2019-06-23 DIAGNOSIS — Z79899 Other long term (current) drug therapy: Secondary | ICD-10-CM | POA: Insufficient documentation

## 2019-06-23 DIAGNOSIS — F17210 Nicotine dependence, cigarettes, uncomplicated: Secondary | ICD-10-CM | POA: Insufficient documentation

## 2019-06-23 DIAGNOSIS — Z20828 Contact with and (suspected) exposure to other viral communicable diseases: Secondary | ICD-10-CM | POA: Insufficient documentation

## 2019-06-23 DIAGNOSIS — R079 Chest pain, unspecified: Secondary | ICD-10-CM | POA: Insufficient documentation

## 2019-06-23 LAB — CBC
HCT: 37.2 % (ref 36.0–46.0)
Hemoglobin: 11.7 g/dL — ABNORMAL LOW (ref 12.0–15.0)
MCH: 26.3 pg (ref 26.0–34.0)
MCHC: 31.5 g/dL (ref 30.0–36.0)
MCV: 83.6 fL (ref 80.0–100.0)
Platelets: 306 10*3/uL (ref 150–400)
RBC: 4.45 MIL/uL (ref 3.87–5.11)
RDW: 15.4 % (ref 11.5–15.5)
WBC: 9.9 10*3/uL (ref 4.0–10.5)
nRBC: 0 % (ref 0.0–0.2)

## 2019-06-23 LAB — BASIC METABOLIC PANEL
Anion gap: 9 (ref 5–15)
BUN: 7 mg/dL (ref 6–20)
CO2: 22 mmol/L (ref 22–32)
Calcium: 8.9 mg/dL (ref 8.9–10.3)
Chloride: 105 mmol/L (ref 98–111)
Creatinine, Ser: 0.79 mg/dL (ref 0.44–1.00)
GFR calc Af Amer: >60 mL/min (ref >60)
GFR calc non Af Amer: >60 mL/min (ref >60)
Glucose, Bld: 118 mg/dL — ABNORMAL HIGH (ref 70–99)
Potassium: 3.6 mmol/L (ref 3.5–5.1)
Sodium: 136 mmol/L (ref 135–145)

## 2019-06-23 LAB — FIBRIN DERIVATIVES D-DIMER (ARMC ONLY): Fibrin derivatives D-dimer (ARMC): 612 ng/mL (FEU) — ABNORMAL HIGH (ref 0.00–499.00)

## 2019-06-23 LAB — POCT PREGNANCY, URINE: Preg Test, Ur: NEGATIVE

## 2019-06-23 LAB — TROPONIN I (HIGH SENSITIVITY): Troponin I (High Sensitivity): 4 ng/L (ref <18)

## 2019-06-23 MED ORDER — IOHEXOL 350 MG/ML SOLN
75.0000 mL | Freq: Once | INTRAVENOUS | Status: AC | PRN
  Administered 2019-06-23: 75 mL via INTRAVENOUS

## 2019-06-23 MED ORDER — IOHEXOL 300 MG/ML  SOLN: 100.0000 mL | Freq: Once | INTRAMUSCULAR | Status: DC | PRN

## 2019-06-23 MED ORDER — KETOROLAC TROMETHAMINE 60 MG/2ML IM SOLN
15.0000 mg | Freq: Once | INTRAMUSCULAR | Status: DC
  Filled 2019-06-23: qty 2

## 2019-06-23 MED ORDER — KETOROLAC TROMETHAMINE 30 MG/ML IJ SOLN
15.0000 mg | Freq: Once | INTRAMUSCULAR | Status: AC
  Administered 2019-06-23: 15 mg via INTRAVENOUS

## 2019-06-23 NOTE — ED Provider Notes (Signed)
[facility] Emergency Department Provider Note  ____________________________________________  Time seen: Approximately 1:01 PM  I have reviewed the triage vital signs and the nursing notes.   HISTORY  Chief Complaint Chest Pain    HPI Mikailah Ensminger is a 43 y.o. female with a history of cellulitis, lymphadenopathy, smoking who comes the ED complaining of left-sided chest pain for the past 3 days, constant, associated with shortness of breath, worse with deep breathing, no alleviating factors, nonradiating, no vomiting or diaphoresis.  Not exertional.  No fevers or chills or body aches.  No known sick contacts.      Past Medical History:  Diagnosis Date  . Arthralgia of right knee   . Arthritis   . Cellulitis of left leg   . Lymphadenopathy, mesenteric   . Right hand pain   . Tobacco abuse      Patient Active Problem List   Diagnosis Date Noted  . Hyponatremia 11/25/2016  . Hypokalemia 11/25/2016  . Leukocytosis 11/25/2016  . Generalized weakness 11/25/2016  . Generalized pain 11/25/2016  . Conjunctivitis 11/25/2016  . Scleritis and episcleritis of right eye   . 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 Abdominal pain 11/14/2016  . Lymphadenopathy, mesenteric   . Arthralgia of right knee 04/02/2015  . Cellulitis of left leg 03/21/2015  . Right hand pain 03/21/2015  . Tobacco use 03/21/2015     Past Surgical History:  Procedure Laterality Date  . denies       Prior to Admission medications   Medication Sig Start Date End Date Taking? Authorizing Provider  oxyCODONE-acetaminophen (PERCOCET) 7.5-325 MG tablet Take 1 tablet by mouth every 6 (six) hours as needed. 03/15/19   Sable Feil, PA-C  predniSONE (DELTASONE) 5 MG tablet Take 5 mg by mouth daily with breakfast.    [provider]     Allergies Ibuprofen and Sulfasalazine   Family History  Problem Relation Age of Onset  . Leukemia Maternal Grandfather   .  Alcohol abuse Father     Social History Social History   Tobacco Use  . Smoking status: Current Every Day Smoker    Packs/day: 0.50    Types: Cigarettes  . Smokeless tobacco: Never Used  Substance Use Topics  . Alcohol use: No  . Drug use: No    Review of Systems  Constitutional:   No fever or chills.  ENT:   No sore throat. No rhinorrhea. Cardiovascular: Positive chest pain as above without syncope. Respiratory:   Positive as above shortness of breath without cough. Gastrointestinal:   Negative for abdominal pain, vomiting and diarrhea.  Musculoskeletal:   Negative for focal pain or swelling All other systems reviewed and are negative except as documented above in ROS and HPI.  ____________________________________________   PHYSICAL EXAM:  VITAL SIGNS: ED Triage Vitals  Enc Vitals Group     BP 06/23/19 0929 106/78     Pulse Rate 06/23/19 0929 79     Resp 06/23/19 1043 18     Temp 06/23/19 0929 98.6 F (37 C)     Temp Source 06/23/19 0929 Oral     SpO2 06/23/19 0929 100 %     Weight 06/23/19 0937 185 lb (83.9 kg)     Height 06/23/19 0937 5\' 5"  (1.651 m)     Head Circumference --      Peak Flow --      Pain Score 06/23/19 0937 8     Pain Loc --  Pain Edu? --      Excl. in [place]? --     Vital signs reviewed, nursing assessments reviewed.   Constitutional:   Alert and oriented. Non-toxic appearance. Eyes:   Conjunctivae are normal. EOMI. PERRL. ENT      Head:   Normocephalic and atraumatic.           Neck:   No meningismus. Full ROM. Hematological/Lymphatic/Immunilogical:   No cervical lymphadenopathy. Cardiovascular:   RRR. Symmetric bilateral radial and DP pulses.  No murmurs. Cap refill less than 2 seconds. Respiratory:   Normal respiratory effort without tachypnea/retractions. Breath sounds are clear and equal bilaterally. No wheezes/rales/rhonchi. Gastrointestinal:   Soft and nontender. Non distended. There is no CVA tenderness.  No rebound, rigidity,  or guarding.  Musculoskeletal:   Normal range of motion in all extremities. No joint effusions.  No lower extremity tenderness.  No edema. Neurologic:   Normal speech and language.  Motor grossly intact. No acute focal neurologic deficits are appreciated.  Skin:    Skin is warm, dry and intact. No rash noted.  No petechiae, purpura, or bullae.  ____________________________________________    LABS (pertinent positives/negatives) (all labs ordered are listed, but only abnormal results are displayed) Labs Reviewed  BASIC METABOLIC PANEL - Abnormal; Notable for the following components:      Result Value   Glucose, Bld 118 (*)    All other components within normal limits  CBC - Abnormal; Notable for the following components:   Hemoglobin 11.7 (*)    All other components within normal limits  FIBRIN DERIVATIVES D-DIMER ([facility] ONLY) - Abnormal; Notable for the following components:   Fibrin derivatives D-dimer ([facility]) 612.00 (*)    All other components within normal limits  NOVEL CORONAVIRUS, NAA (HOSP ORDER, SEND-OUT TO REF LAB; TAT 18-24 HRS)  POC URINE PREG, ED  POCT PREGNANCY, URINE  TROPONIN I (HIGH SENSITIVITY)  TROPONIN I (HIGH SENSITIVITY)   ____________________________________________   EKG  Interpreted by me  Date: 06/23/2019  Rate: 84  Rhythm: normal sinus rhythm  QRS Axis: normal  Intervals: normal  ST/T Wave abnormalities: normal  Conduction Disutrbances: none  Narrative Interpretation: unremarkable      ____________________________________________    RADIOLOGY  Dg Chest 2 View  Result Date: 06/23/2019 CLINICAL DATA:  Chest pain, shortness of breath EXAM: CHEST - 2 VIEW COMPARISON:  07/26/2017 FINDINGS: The heart size and mediastinal contours are within normal limits. Both lungs are clear. The visualized skeletal structures are unremarkable. IMPRESSION: No acute abnormality of the lungs. Electronically Signed   By: Eddie Candle M.D.   On: 06/23/2019 10:57    Ct Angio Chest Pe W And/or Wo Contrast  Result Date: 06/23/2019 CLINICAL DATA:  Chest pain x3 days, shortness of breath, nausea, previous tobacco abuse EXAM: CT ANGIOGRAPHY CHEST WITH CONTRAST TECHNIQUE: Multidetector CT imaging of the chest was performed using the standard protocol during bolus administration of intravenous contrast. Multiplanar CT image reconstructions and MIPs were obtained to evaluate the vascular anatomy. CONTRAST:  38mL OMNIPAQUE IOHEXOL 350 MG/ML SOLN COMPARISON:  None. 11/15/2016 FINDINGS: Cardiovascular: Heart size normal. No pericardial effusion. Dilated pulmonary artery. Satisfactory opacification of pulmonary arteries noted, and there is no evidence of pulmonary emboli. Adequate contrast opacification of the thoracic aorta with no evidence of dissection, aneurysm, or stenosis. There is classic 3-vessel brachiocephalic arch anatomy without proximal stenosis. No significant atheromatous change. Mediastinum/Nodes: No hilar or mediastinal adenopathy. Lungs/Pleura: Trace left pleural effusion. No pneumothorax. Lungs are clear. Musculoskeletal: No chest  wall abnormality. No acute or significant osseous findings. Upper Abdomen: No acute findings Review of the MIP images confirms the above findings. IMPRESSION: 1. Negative for acute PE or thoracic aortic dissection. 2. Trace left pleural effusion. Electronically Signed   By: Lucrezia Europe M.D.   On: 06/23/2019 11:56    ____________________________________________   PROCEDURES Procedures  ____________________________________________  DIFFERENTIAL DIAGNOSIS   Musculoskeletal chest wall pain, pleurisy, COVID-19, pneumonitis, pneumonia, pneumothorax, pulmonary embolism  CLINICAL IMPRESSION / ASSESSMENT AND PLAN / ED COURSE  Medications ordered in the ED: Medications  ketorolac (TORADOL) 30 MG/ML injection 15 mg (15 mg Intravenous Given 06/23/19 1045)  iohexol (OMNIPAQUE) 350 MG/ML injection 75 mL (75 mLs Intravenous Contrast  Given 06/23/19 1137)    Pertinent labs & imaging results that were available during my care of the patient were reviewed by me and considered in my medical decision making (see chart for details).  Kaydree Fejes was evaluated in Emergency Department on 06/23/2019 for the symptoms described in the history of present illness. She was evaluated in the context of the global COVID-19 pandemic, which necessitated consideration that the patient might be at risk for infection with the SARS-CoV-2 virus that causes COVID-19. Institutional protocols and algorithms that pertain to the evaluation of patients at risk for COVID-19 are in a state of rapid change based on information released by regulatory bodies including the CDC and federal and state organizations. These policies and algorithms were followed during the patient's care in the ED.   Patient presents with atypical chest pain, pleuritic, concerning for PE or pulmonary disease.  Initial chest x-ray is unremarkable, EKG is normal.  Vital signs are normal.  Due to the nature of the symptoms, d-dimer was performed.  Clinical Course as of Jun 22 1302  Sat Jun 23, 2019  1120 Chest x-ray unremarkable.  D-dimer elevated, will order CT angiogram of the chest.   [PS]    Clinical Course User Index [PS] Carrie Mew, MD     ----------------------------------------- 1:03 PM on 06/23/2019 -----------------------------------------  CT angiogram negative for PE or other pulmonary disease.  COVID swab sent.  Recommend NSAIDs for likely musculoskeletal chest pain.  Patient is feeling much better after Toradol.  ____________________________________________   FINAL CLINICAL IMPRESSION(S) / ED DIAGNOSES    Final diagnoses:  Nonspecific chest pain     ED Discharge Orders    None      Portions of this note were generated with dragon dictation software. Dictation errors may occur despite best attempts at proofreading.   Carrie Mew,  MD 06/23/19 [phone]

## 2019-06-23 NOTE — ED Notes (Signed)
Patient transported to CT 

## 2019-06-23 NOTE — Discharge Instructions (Signed)
We are unable to identify an exact cause of your pain today but your work-up is reassuring.  Your lab tests and CT scan of the chest were all okay.  We have sent a coronavirus test to [facility] which will probably give [place] a result tomorrow.  In the meantime, please isolate yourself at home and avoid contact with others until the result is available.  Take anti-inflammatory medicine such as naproxen or Tylenol for pain.

## 2019-06-23 NOTE — ED Triage Notes (Signed)
Pt arrived via POV with worsening chest pain x 3 days. Pt states the pain is in the left side of the chest and is non-radiating.  Pt reports shortness of breath with the chest pain as well as a nausea.

## 2019-06-24 LAB — NOVEL CORONAVIRUS, NAA (HOSP ORDER, SEND-OUT TO REF LAB; TAT 18-24 HRS): SARS-CoV-2, NAA: NOT DETECTED

## 2019-07-18 IMAGING — US US PELVIS COMPLETE WITH TRANSVAGINAL
1 series · 14 of 25 positions shown · non-contrast
Comparison: None

CLINICAL DATA: Vaginal bleeding for 1 week.

EXAM:
TRANSABDOMINAL AND TRANSVAGINAL ULTRASOUND OF PELVIS
TECHNIQUE: Both transabdominal and transvaginal ultrasound examinations of the
pelvis were performed. Transabdominal technique was performed for
global imaging of the pelvis including uterus, ovaries, adnexal
regions, and pelvic cul-de-sac. It was necessary to proceed with
endovaginal exam following the transabdominal exam to visualize the
endometrium and ovaries to better advantage.

[Series 1: us pelvis complete with transvaginal · 14 of 65 slices shown]
[im 1/65]
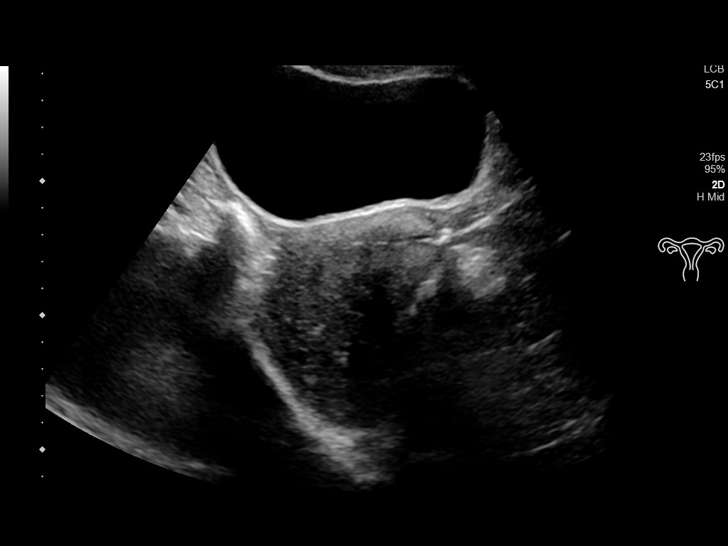
[im 6/65]
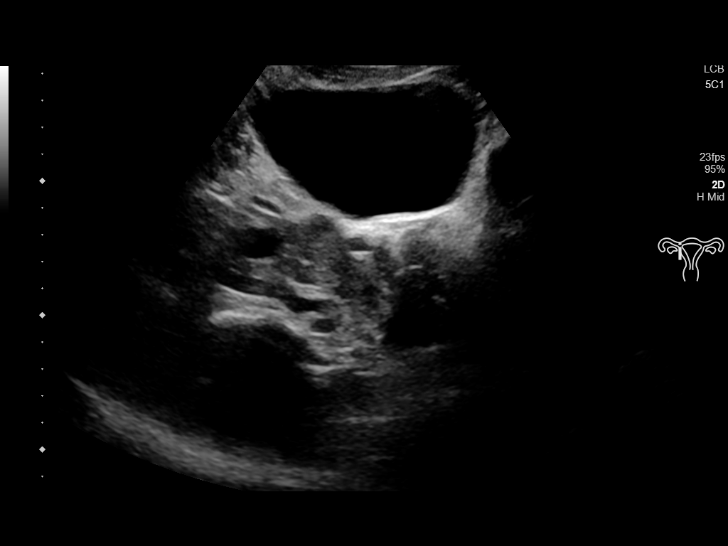
[im 11/65]
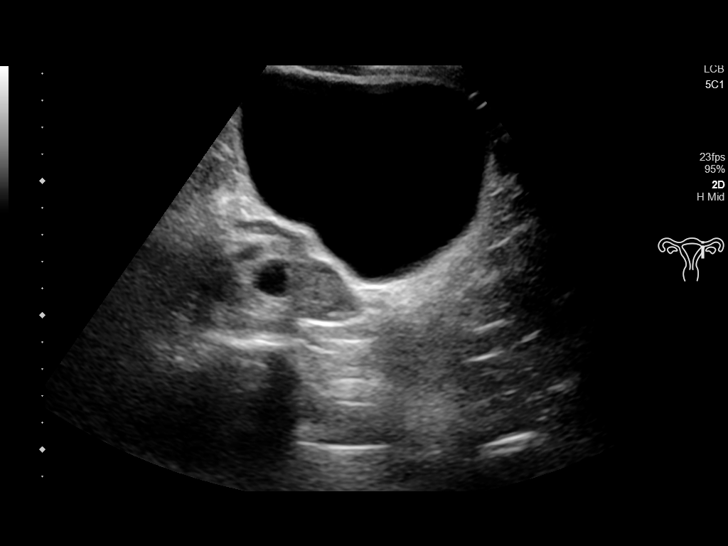
[im 17/65]
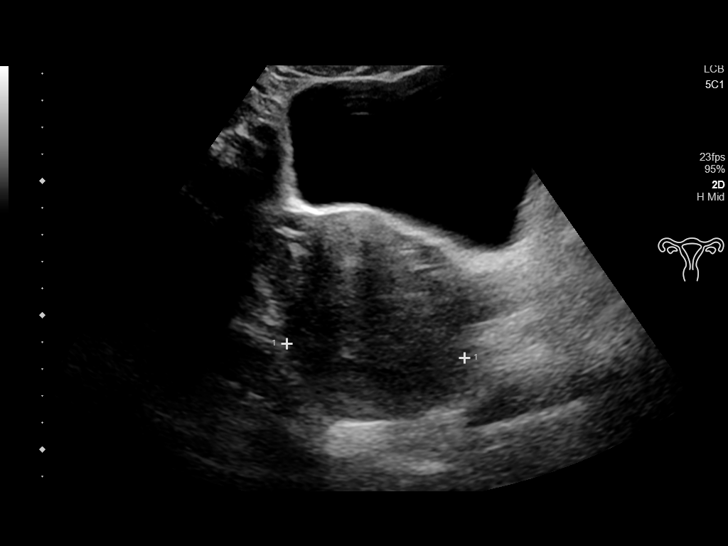
[im 22/65]
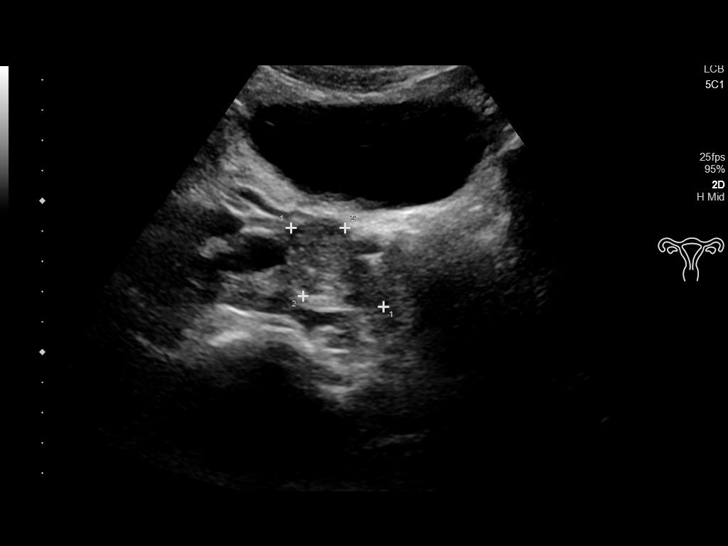
[im 25/65]
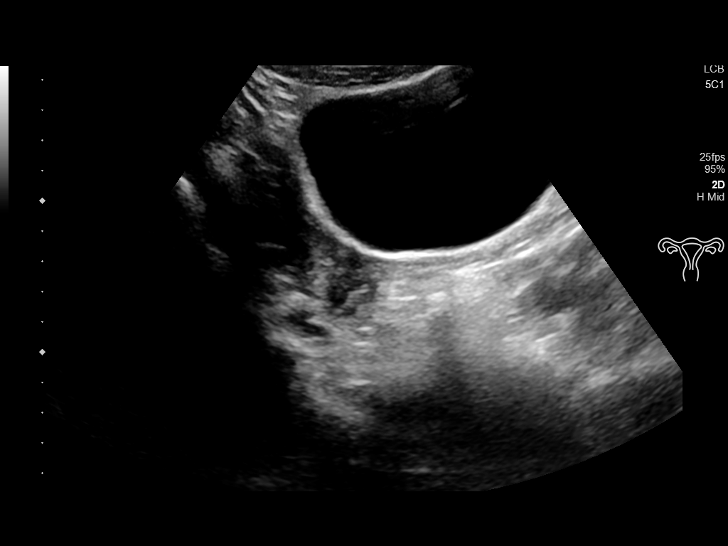
[im 30/65]
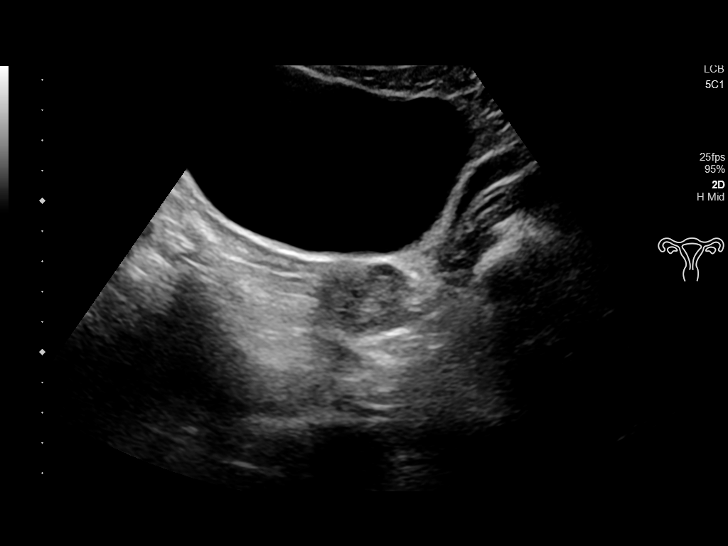
[im 35/65]
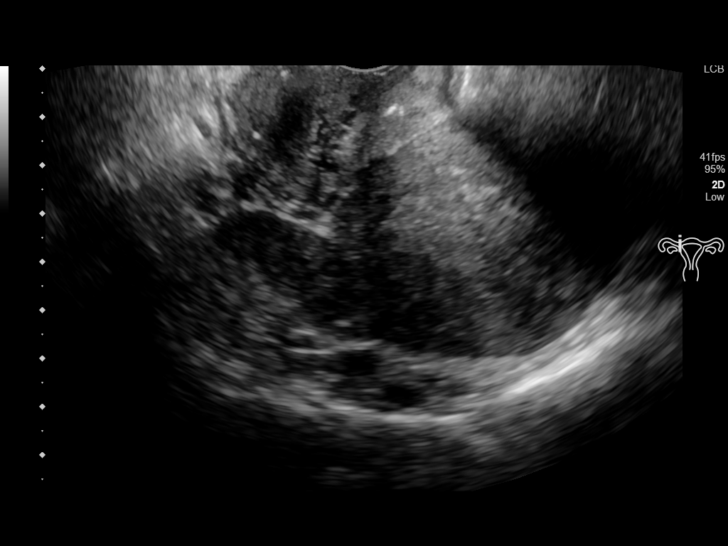
[im 41/65]
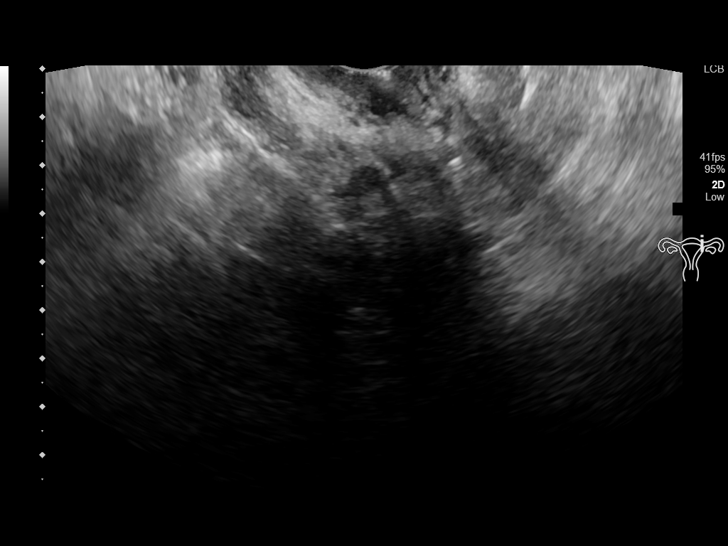
[im 43/65]
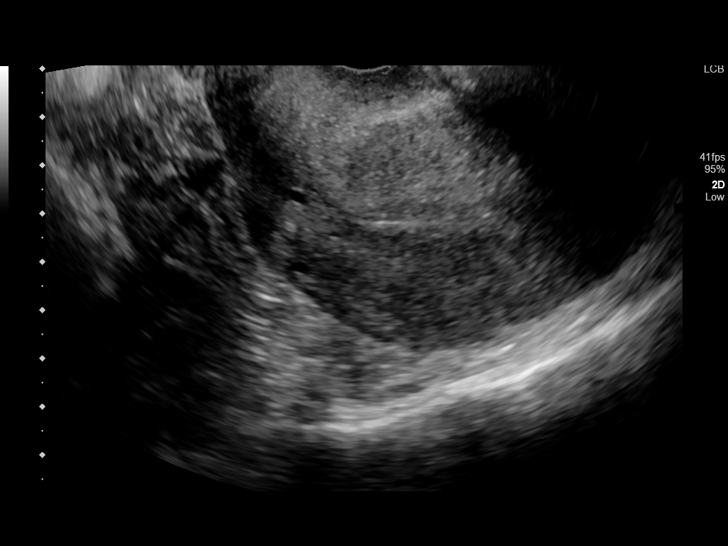
[im 49/65]
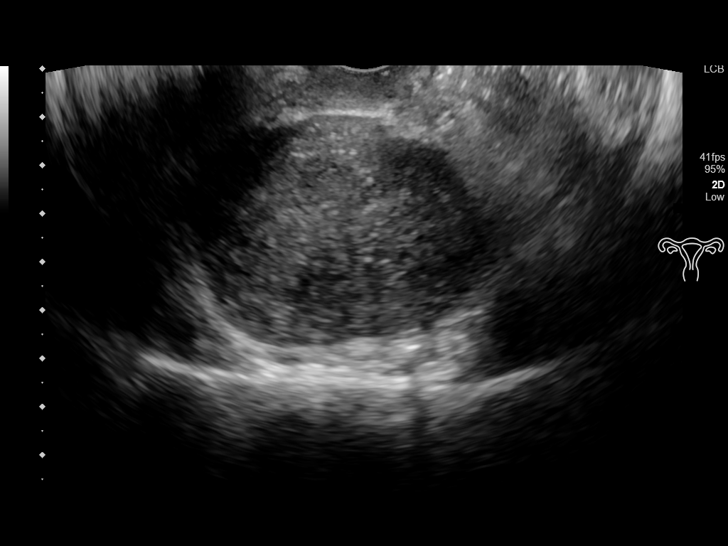
[im 54/65]
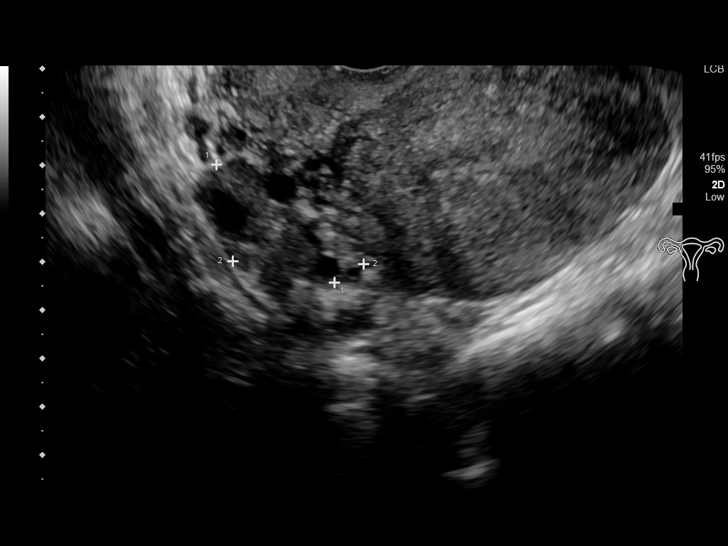
[im 59/65]
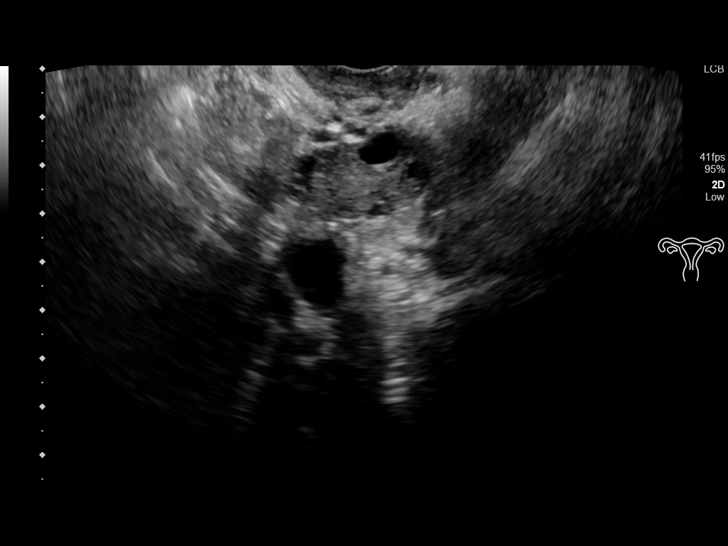
[im 65/65]
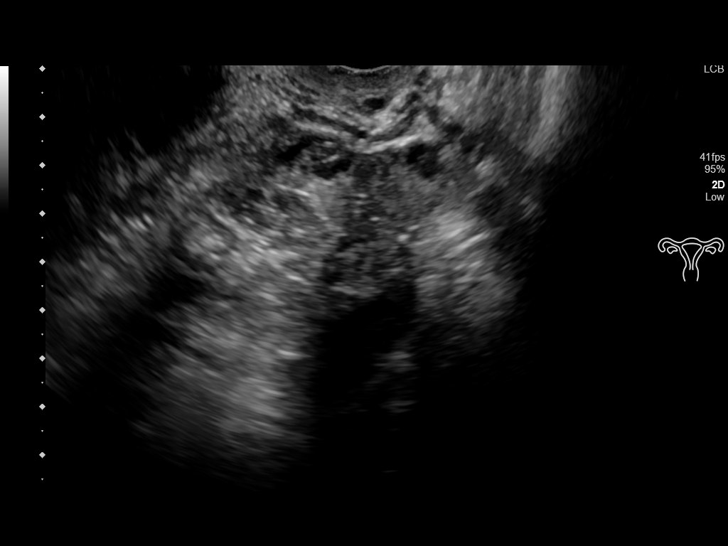

[14 of 25 positions shown; findings below may reference images not displayed]

FINDINGS: Uterus

Measurements: 9.4 x 5.5 x 7.0 cm = volume: 190 mL. Uterus is
retroverted. No fibroids or other mass visualized.

Endometrium

Thickness: 2 mm.  No focal abnormality visualized.

Right ovary

Measurements: 3.5 x 2.7 x 1.6 cm = volume: 8 mL. Normal
appearance/no adnexal mass.

Left ovary

Measurements: 3.2 x 1.7 x 2.2 cm = volume: 6.2 mL. Normal
appearance/no adnexal mass.

Other findings

No abnormal free fluid.
IMPRESSION: Normal transabdominal and endovaginal pelvic ultrasound.

## 2019-08-06 ENCOUNTER — Emergency Department: Payer: Self-pay

## 2019-08-06 ENCOUNTER — Other Ambulatory Visit: Payer: Self-pay

## 2019-08-06 ENCOUNTER — Emergency Department
Admission: EM | Admit: 2019-08-06 | Discharge: 2019-08-06 | Disposition: A | Discharge status: Home / Self Care | Payer: Self-pay | Attending: Emergency Medicine | Admitting: Emergency Medicine

## 2019-08-06 ENCOUNTER — Encounter: Payer: Self-pay | Admitting: Emergency Medicine

## 2019-08-06 DIAGNOSIS — S161XXA Strain of muscle, fascia and tendon at neck level, initial encounter: Secondary | ICD-10-CM | POA: Insufficient documentation

## 2019-08-06 DIAGNOSIS — M25552 Pain in left hip: Secondary | ICD-10-CM | POA: Insufficient documentation

## 2019-08-06 DIAGNOSIS — W109XXA Fall (on) (from) unspecified stairs and steps, initial encounter: Secondary | ICD-10-CM | POA: Insufficient documentation

## 2019-08-06 DIAGNOSIS — F17210 Nicotine dependence, cigarettes, uncomplicated: Secondary | ICD-10-CM | POA: Insufficient documentation

## 2019-08-06 DIAGNOSIS — W19XXXA Unspecified fall, initial encounter: Secondary | ICD-10-CM

## 2019-08-06 DIAGNOSIS — Z79899 Other long term (current) drug therapy: Secondary | ICD-10-CM | POA: Insufficient documentation

## 2019-08-06 DIAGNOSIS — Z882 Allergy status to sulfonamides status: Secondary | ICD-10-CM | POA: Insufficient documentation

## 2019-08-06 DIAGNOSIS — Y9301 Activity, walking, marching and hiking: Secondary | ICD-10-CM | POA: Insufficient documentation

## 2019-08-06 DIAGNOSIS — Y999 Unspecified external cause status: Secondary | ICD-10-CM | POA: Insufficient documentation

## 2019-08-06 DIAGNOSIS — M79602 Pain in left arm: Secondary | ICD-10-CM

## 2019-08-06 DIAGNOSIS — Y929 Unspecified place or not applicable: Secondary | ICD-10-CM | POA: Insufficient documentation

## 2019-08-06 DIAGNOSIS — M25512 Pain in left shoulder: Secondary | ICD-10-CM | POA: Insufficient documentation

## 2019-08-06 DIAGNOSIS — Z886 Allergy status to analgesic agent status: Secondary | ICD-10-CM | POA: Insufficient documentation

## 2019-08-06 MED ORDER — TRAMADOL HCL 50 MG PO TABS: 50.0000 mg | ORAL_TABLET | Freq: Four times a day (QID) | ORAL | 0 refills | Status: DC | PRN

## 2019-08-06 MED ORDER — CYCLOBENZAPRINE HCL 10 MG PO TABS: 10.0000 mg | ORAL_TABLET | Freq: Three times a day (TID) | ORAL | 0 refills | Status: DC | PRN

## 2019-08-06 NOTE — Discharge Instructions (Signed)
Follow discharge care instruction take medication as directed.  Do not drive or operate any machinery while taking Flexeril and tramadol.

## 2019-08-06 NOTE — ED Triage Notes (Signed)
Pt reports fell down about 7 steps yesterday and hurt her entire left side. Pt c/o pain to left shoulder, arm, hip, leg and neck. Pt reports she slipped. Pt looking down she when she speaks to RN and picking at fingernails. Pt reports feels safe at home.

## 2019-08-06 NOTE — ED Notes (Signed)
See triage note  States she slipped down steps yesterday  having pain from neck into shoulder and also left hip area  Describes pain as burning to neck and arm area

## 2019-08-06 NOTE — ED Provider Notes (Signed)
[facility] Emergency Department Provider Note   ____________________________________________   First MD Initiated Contact with Patient 08/06/19 [phone]     (approximate)  I have reviewed the triage vital signs and the nursing notes.   HISTORY  Chief Complaint Fall, Arm Injury, Leg Injury, and Hip Pain    HPI Regina Carpenter is a 43 y.o. female patient complain of neck and left shoulder pain secondary to a fall.  Patient also complain of left hip and lower leg pain from the fall.  Patient that she slipped while coming down some steps.  Patient denies LOC or head injury.  Patient is a radicular component to her neck pain to the left shoulder.  Patient state left extremity is weak with abduction.  Patient rates the pain as a 10/10.  Patient described pain as "achy".  Patient has a history of inflammatory arthritis.  No palliative measures for complaint.         Past Medical History:  Diagnosis Date  . Arthralgia of right knee   . Arthritis   . Cellulitis of left leg   . Lymphadenopathy, mesenteric   . Right hand pain   . Tobacco abuse     Patient Active Problem List   Diagnosis Date Noted  . Hyponatremia 11/25/2016  . Hypokalemia 11/25/2016  . Leukocytosis 11/25/2016  . Generalized weakness 11/25/2016  . Generalized pain 11/25/2016  . Conjunctivitis 11/25/2016  . Scleritis and episcleritis of right eye   . Inflammatory polyarthritis ([place]) 11/23/2016  . Abdominal pain 11/14/2016  . Lymphadenopathy, mesenteric   . Arthralgia of right knee 04/02/2015  . Cellulitis of left leg 03/21/2015  . Right hand pain 03/21/2015  . Tobacco use 03/21/2015    Past Surgical History:  Procedure Laterality Date  . denies      Prior to Admission medications   Medication Sig Start Date End Date Taking? Authorizing Provider  folic acid (FOLVITE) 1 MG tablet Take 1 mg by mouth daily.   Yes [provider]  methotrexate (RHEUMATREX) 2.5 MG tablet Take  2.5 mg by mouth once a week. Caution:Chemotherapy. Protect from light.   Yes [provider]  cyclobenzaprine (FLEXERIL) 10 MG tablet Take 1 tablet (10 mg total) by mouth 3 (three) times daily as needed. 08/06/19   Sable Feil, PA-C  predniSONE (DELTASONE) 5 MG tablet Take 5 mg by mouth daily with breakfast.    [provider]  traMADol (ULTRAM) 50 MG tablet Take 1 tablet (50 mg total) by mouth every 6 (six) hours as needed for moderate pain. 08/06/19   Sable Feil, PA-C    Allergies Ibuprofen and Sulfasalazine  Family History  Problem Relation Age of Onset  . Leukemia Maternal Grandfather   . Alcohol abuse Father     Social History Social History   Tobacco Use  . Smoking status: Current Every Day Smoker    Packs/day: 0.50    Types: Cigarettes  . Smokeless tobacco: Never Used  Substance Use Topics  . Alcohol use: No  . Drug use: No    Review of Systems  Constitutional: No fever/chills Eyes: No visual changes. ENT: No sore throat. Cardiovascular: Denies chest pain. Respiratory: Denies shortness of breath. Gastrointestinal: No abdominal pain.  No nausea, no vomiting.  No diarrhea.  No constipation. Genitourinary: Negative for dysuria. Musculoskeletal: Neck and left shoulder pain. Skin: Negative for rash. Neurological: Negative for headaches, focal weakness or numbness. Allergic/Immunilogical: NSAIDs. ____________________________________________   PHYSICAL EXAM:  VITAL SIGNS: ED  Triage Vitals  Enc Vitals Group     BP 08/06/19 0731 120/69     Pulse Rate 08/06/19 0731 71     Resp 08/06/19 0734 20     Temp 08/06/19 0731 98.1 F (36.7 C)     Temp Source 08/06/19 0731 Oral     SpO2 08/06/19 0731 100 %     Weight 08/06/19 0735 180 lb (81.6 kg)     Height 08/06/19 0735 5\' 5"  (1.651 m)     Head Circumference --      Peak Flow --      Pain Score 08/06/19 0735 10     Pain Loc --      Pain Edu? --      Excl. in [place]? --     Constitutional:  Alert and oriented. Well appearing and in no acute distress. Neck: No stridor.  No cervical spine tenderness to palpation. Hematological/Lymphatic/Immunilogical: No cervical lymphadenopathy. Cardiovascular: Normal rate, regular rhythm. Grossly normal heart sounds.  Good peripheral circulation. Respiratory: Normal respiratory effort.  No retractions. Lungs CTAB. Gastrointestinal: Soft and nontender. No distention. No abdominal bruits. No CVA tenderness. Genitourinary: Deferred Musculoskeletal: Obvious deformity to the cervical spine or left shoulder.  Patient has full neck range of motion of the neck and left shoulder.  Patient has tremors to the left upper extremity when arms are outstretched.   Patient strength to the left upper extremity is 3/5 compared to the right. Neurologic:  Normal speech and language. No gross focal neurologic deficits are appreciated. No gait instability. Skin:  Skin is warm, dry and intact. No rash noted.  No abrasions or ecchymosis. Psychiatric: Mood and affect are normal. Speech and behavior are normal.  ____________________________________________   LABS (all labs ordered are listed, but only abnormal results are displayed)  Labs Reviewed - No data to display ____________________________________________  EKG   ____________________________________________  RADIOLOGY  ED MD interpretation:    Official radiology report(s): Dg Cervical Spine 2-3 Views  Result Date: 08/06/2019 CLINICAL DATA:  Neck and left shoulder pain following a fall down steps yesterday. EXAM: CERVICAL SPINE - 2-3 VIEW COMPARISON:  Cervical spine MR dated 11/23/2016 FINDINGS: Again demonstrated is mild reversal of the normal cervical lordosis at the C5-6 level. No prevertebral soft tissue swelling, fracture or subluxation seen. IMPRESSION: Stable mild reversal of the normal cervical lordosis. No fracture or subluxation. Electronically Signed   By: Claudie Revering M.D.   On: 08/06/2019 09:12    Dg Shoulder Left  Result Date: 08/06/2019 CLINICAL DATA:  Pain secondary to fall. EXAM: LEFT SHOULDER - 2+ VIEW COMPARISON:  None. FINDINGS: There is normal bony alignment. No evidence of acute osseous or articular abnormality. The joint spaces are maintained. IMPRESSION: No acute osseous or articular abnormality. Electronically Signed   By: Kellie Simmering DO   On: 08/06/2019 09:13    ____________________________________________   PROCEDURES  Procedure(s) performed (including Critical Care):  Procedures   ____________________________________________   INITIAL IMPRESSION / ASSESSMENT AND PLAN / ED COURSE  As part of my medical decision making, I reviewed the following data within the [place] was evaluated in Emergency Department on 08/06/2019 for the symptoms described in the history of present illness. She was evaluated in the context of the global COVID-19 pandemic, which necessitated consideration that the patient might be at risk for infection with the SARS-CoV-2 virus that causes COVID-19. Institutional protocols and algorithms that pertain to the  evaluation of patients at risk for COVID-19 are in a state of rapid change based on information released by regulatory bodies including the CDC and federal and state organizations. These policies and algorithms were followed during the patient's care in the ED.  Patient complains of neck, upper, and lower extremity pain secondary to a fall yesterday.  Discussed x-ray findings with patient.  Physical exam is consistent with cervical strain and muscle skeletal pain.  Patient given discharge care instruction work note.  Patient advised take medication as directed.  Patient advised follow-up PCP if no improvement in 3 to 5 days.     ____________________________________________   FINAL CLINICAL IMPRESSION(S) / ED DIAGNOSES  Final diagnoses:  Fall, initial encounter  Strain of neck muscle,  initial encounter  Musculoskeletal pain of left upper extremity     ED Discharge Orders         Ordered    cyclobenzaprine (FLEXERIL) 10 MG tablet  3 times daily PRN     08/06/19 0931    traMADol (ULTRAM) 50 MG tablet  Every 6 hours PRN     08/06/19 0931           Note:  This document was prepared using Dragon voice recognition software and may include unintentional dictation errors.    Sable Feil, PA-C 08/06/19 [id number]    Earleen Newport, MD 08/06/19 1136

## 2019-08-14 ENCOUNTER — Other Ambulatory Visit: Payer: Self-pay

## 2019-08-14 ENCOUNTER — Emergency Department
Admission: EM | Admit: 2019-08-14 | Discharge: 2019-08-14 | Disposition: A | Discharge status: Home / Self Care | Payer: Self-pay | Attending: Emergency Medicine | Admitting: Emergency Medicine

## 2019-08-14 ENCOUNTER — Emergency Department: Payer: Self-pay

## 2019-08-14 ENCOUNTER — Encounter: Payer: Self-pay | Admitting: Emergency Medicine

## 2019-08-14 DIAGNOSIS — R079 Chest pain, unspecified: Secondary | ICD-10-CM

## 2019-08-14 DIAGNOSIS — Z79899 Other long term (current) drug therapy: Secondary | ICD-10-CM | POA: Insufficient documentation

## 2019-08-14 DIAGNOSIS — R071 Chest pain on breathing: Secondary | ICD-10-CM | POA: Insufficient documentation

## 2019-08-14 DIAGNOSIS — F17210 Nicotine dependence, cigarettes, uncomplicated: Secondary | ICD-10-CM | POA: Insufficient documentation

## 2019-08-14 LAB — CBC
HCT: 39.2 % (ref 36.0–46.0)
Hemoglobin: 12.3 g/dL (ref 12.0–15.0)
MCH: 25.5 pg — ABNORMAL LOW (ref 26.0–34.0)
MCHC: 31.4 g/dL (ref 30.0–36.0)
MCV: 81.3 fL (ref 80.0–100.0)
Platelets: 279 10*3/uL (ref 150–400)
RBC: 4.82 MIL/uL (ref 3.87–5.11)
RDW: 15.6 % — ABNORMAL HIGH (ref 11.5–15.5)
WBC: 12.5 10*3/uL — ABNORMAL HIGH (ref 4.0–10.5)
nRBC: 0 % (ref 0.0–0.2)

## 2019-08-14 LAB — BASIC METABOLIC PANEL
Anion gap: 7 (ref 5–15)
BUN: 8 mg/dL (ref 6–20)
CO2: 26 mmol/L (ref 22–32)
Calcium: 9.1 mg/dL (ref 8.9–10.3)
Chloride: 102 mmol/L (ref 98–111)
Creatinine, Ser: 0.87 mg/dL (ref 0.44–1.00)
GFR calc Af Amer: >60 mL/min (ref >60)
GFR calc non Af Amer: >60 mL/min (ref >60)
Glucose, Bld: 123 mg/dL — ABNORMAL HIGH (ref 70–99)
Potassium: 3.6 mmol/L (ref 3.5–5.1)
Sodium: 135 mmol/L (ref 135–145)

## 2019-08-14 LAB — TROPONIN I (HIGH SENSITIVITY)
Troponin I (High Sensitivity): 2 ng/L (ref <18)
Troponin I (High Sensitivity): <2 ng/L (ref <18)

## 2019-08-14 LAB — POCT PREGNANCY, URINE: Preg Test, Ur: NEGATIVE

## 2019-08-14 MED ORDER — DEXAMETHASONE 10 MG/ML FOR PEDIATRIC ORAL USE
10.0000 mg | Freq: Once | INTRAMUSCULAR | Status: AC
  Administered 2019-08-14: 10 mg via ORAL
  Filled 2019-08-14: qty 1

## 2019-08-14 MED ORDER — ALPRAZOLAM 0.5 MG PO TABS
0.5000 mg | ORAL_TABLET | Freq: Once | ORAL | Status: AC
  Administered 2019-08-14: 17:00:00 0.5 mg via ORAL
  Filled 2019-08-14: qty 1

## 2019-08-14 NOTE — ED Triage Notes (Signed)
Pt c/o CP radiating into back and RT shoulder pain since yesterday. Hx of pulmonary edema. Pt ambulatory, VS

## 2019-08-14 NOTE — ED Provider Notes (Signed)
[facility] Emergency Department Provider Note  ____________________________________________  Time seen: Approximately 4:00 PM  I have reviewed the triage vital signs and the nursing notes.   HISTORY  Chief Complaint Chest Pain    HPI Regina Carpenter is a 43 y.o. female presents to the emergency department with acute, right-sided chest pain that started today.  Patient states that she was particularly anxious this morning and then noticed chest pain.  Chest pain does not worsen with movement and is not associated with shortness of breath.  No nausea, vomiting or abdominal pain.  Patient states that she does notice chest pain more when she takes a deep breath.  She denies associated cough, rhinorrhea, nasal congestion, fever headache.  States that she experienced similar chest pain several weeks ago which resolved after patient's primary care treated her with prednisone.  She denies recent surgery or prolonged immobilization.  She is a daily smoker.  She denies a history of DVT or PE. No other alleviating measures have been attempted.         Past Medical History:  Diagnosis Date  . Arthralgia of right knee   . Arthritis   . Cellulitis of left leg   . Lymphadenopathy, mesenteric   . Right hand pain   . Tobacco abuse     Patient Active Problem List   Diagnosis Date Noted  . Hyponatremia 11/25/2016  . Hypokalemia 11/25/2016  . Leukocytosis 11/25/2016  . Generalized weakness 11/25/2016  . Generalized pain 11/25/2016  . Conjunctivitis 11/25/2016  . Scleritis and episcleritis of right eye   . Inflammatory polyarthritis ([place]) 11/23/2016  . Abdominal pain 11/14/2016  . Lymphadenopathy, mesenteric   . Arthralgia of right knee 04/02/2015  . Cellulitis of left leg 03/21/2015  . Right hand pain 03/21/2015  . Tobacco use 03/21/2015    Past Surgical History:  Procedure Laterality Date  . denies      Prior to Admission medications   Medication Sig Start  Date End Date Taking? Authorizing Provider  cyclobenzaprine (FLEXERIL) 10 MG tablet Take 1 tablet (10 mg total) by mouth 3 (three) times daily as needed. 08/06/19   Sable Feil, PA-C  folic acid (FOLVITE) 1 MG tablet Take 1 mg by mouth daily.    [provider]  methotrexate (RHEUMATREX) 2.5 MG tablet Take 2.5 mg by mouth once a week. Caution:Chemotherapy. Protect from light.    [provider]  predniSONE (DELTASONE) 5 MG tablet Take 5 mg by mouth daily with breakfast.    [provider]  traMADol (ULTRAM) 50 MG tablet Take 1 tablet (50 mg total) by mouth every 6 (six) hours as needed for moderate pain. 08/06/19   Sable Feil, PA-C    Allergies Ibuprofen and Sulfasalazine  Family History  Problem Relation Age of Onset  . Leukemia Maternal Grandfather   . Alcohol abuse Father     Social History Social History   Tobacco Use  . Smoking status: Current Every Day Smoker    Packs/day: 0.50    Types: Cigarettes  . Smokeless tobacco: Never Used  Substance Use Topics  . Alcohol use: No  . Drug use: No     Review of Systems  Constitutional: No fever/chills Eyes: No visual changes. No discharge ENT: No upper respiratory complaints. Cardiovascular: Patient has chest pain.  Respiratory: no cough. No SOB. Gastrointestinal: No abdominal pain.  No nausea, no vomiting.  No diarrhea.  No constipation. Genitourinary: Negative for dysuria. No hematuria Musculoskeletal: Negative for musculoskeletal  pain. Skin: Negative for rash, abrasions, lacerations, ecchymosis. Neurological: Negative for headaches, focal weakness or numbness.   ____________________________________________   PHYSICAL EXAM:  VITAL SIGNS: ED Triage Vitals  Enc Vitals Group     BP 08/14/19 1426 125/80     Pulse Rate 08/14/19 1426 82     Resp 08/14/19 1426 16     Temp 08/14/19 1426 98.6 F (37 C)     Temp Source 08/14/19 1426 Oral     SpO2 08/14/19 1426 100 %     Weight --       Height --      Head Circumference --      Peak Flow --      Pain Score 08/14/19 1425 8     Pain Loc --      Pain Edu? --      Excl. in [place]? --      Constitutional: Alert and oriented. Well appearing and in no acute distress. Eyes: Conjunctivae are normal. PERRL. EOMI. Head: Atraumatic. ENT:      Nose: No congestion/rhinnorhea.      Mouth/Throat: Mucous membranes are moist.  Neck: No stridor.  No cervical spine tenderness to palpation. Cardiovascular: Normal rate, regular rhythm. Normal S1 and S2.  Good peripheral circulation.  No reproducible chest wall pain to palpation. Respiratory: Normal respiratory effort without tachypnea or retractions. Lungs CTAB. Good air entry to the bases with no decreased or absent breath sounds. Gastrointestinal: Bowel sounds 4 quadrants. Soft and nontender to palpation. No guarding or rigidity. No palpable masses. No distention. No CVA tenderness. Musculoskeletal: Full range of motion to all extremities. No gross deformities appreciated. Neurologic:  Normal speech and language. No gross focal neurologic deficits are appreciated.  Skin:  Skin is warm, dry and intact. No rash noted. Psychiatric: Mood and affect are normal. Speech and behavior are normal. Patient exhibits appropriate insight and judgement.   ____________________________________________   LABS (all labs ordered are listed, but only abnormal results are displayed)  Labs Reviewed  BASIC METABOLIC PANEL - Abnormal; Notable for the following components:      Result Value   Glucose, Bld 123 (*)    All other components within normal limits  CBC - Abnormal; Notable for the following components:   WBC 12.5 (*)    MCH 25.5 (*)    RDW 15.6 (*)    All other components within normal limits  POC URINE PREG, ED  POCT PREGNANCY, URINE  TROPONIN I (HIGH SENSITIVITY)  TROPONIN I (HIGH SENSITIVITY)    ____________________________________________  EKG   ____________________________________________  RADIOLOGY I personally viewed and evaluated these images as part of my medical decision making, as well as reviewing the written report by the radiologist.  Dg Chest 2 View  Result Date: 08/14/2019 CLINICAL DATA:  Chest pain EXAM: CHEST - 2 VIEW COMPARISON:  06/23/2019 FINDINGS: The heart size and mediastinal contours are within normal limits. Both lungs are clear. The visualized skeletal structures are unremarkable. IMPRESSION: No active cardiopulmonary disease. Electronically Signed   By: Davina Poke M.D.   On: 08/14/2019 14:59    ____________________________________________    PROCEDURES  Procedure(s) performed:    Procedures    Medications  ALPRAZolam Duanne Moron) tablet 0.5 mg (0.5 mg Oral Given 08/14/19 1653)  dexamethasone (DECADRON) 10 MG/ML injection for Pediatric ORAL use 10 mg (10 mg Oral Given 08/14/19 1653)     ____________________________________________   INITIAL IMPRESSION / ASSESSMENT AND PLAN / ED COURSE  Pertinent labs & imaging results  that were available during my care of the patient were reviewed by me and considered in my medical decision making (see chart for details).  Review of the [place] CSRS was performed in accordance of the [place] prior to dispensing any controlled drugs.         Assessment and plan Chest pain 43 year old female presents to the emergency department with right-sided chest pain that occurred today when she awoke.  Chest pain is not worsened by physical activity and patient states that pain has been relieved by sitting in a calm environment.  She states that she felt particularly anxious today and has a history of anxiety.  Vital signs were reassuring at triage.  On physical exam, patient appeared to be resting comfortably.  No adventitious lung sounds were auscultated and patient had good lung sounds at the bases  bilaterally.  She was not tachycardic.  Differential diagnosis included PE, pneumothorax, COVID-19, pleural effusion, costochondritis, pneumonia....  Patient was seen and evaluated on June 23, 2019 with similar complaints of chest pain and had CTA at that time which revealed no evidence of pulmonary emboli.   Low suspicion for PE today as patient has low risk of PE given Wells criteria and is PERC negative.  Chest x-ray revealed no evidence of pneumothorax or pleural effusion.  There was no consolidations, opacities or infiltrates that would suggest pneumonia.  Patient's right-sided chest pain was not reproducible with palpation, decreasing suspicion for costochondritis.  EKG revealed normal sinus rhythm without ischemic changes and without apparent arrhythmia.  Both sets of troponin were within reference range.  Patient's right-sided chest pain resolved with Xanax and Decadron.  Patient was advised to follow-up with primary care as her right-sided chest pain occurred after worsening anxiety that occurred today before coming into the emergency department.  All patient questions were answered.     ____________________________________________  FINAL CLINICAL IMPRESSION(S) / ED DIAGNOSES  Final diagnoses:  Nonspecific chest pain      NEW MEDICATIONS STARTED DURING THIS VISIT:  ED Discharge Orders    None          This chart was dictated using voice recognition software/Dragon. Despite best efforts to proofread, errors can occur which can change the meaning. Any change was purely unintentional.    Lannie Fields, PA-C 08/14/19 1749    Harvest Dark, MD 08/14/19 2119

## 2019-09-28 ENCOUNTER — Emergency Department
Admission: EM | Admit: 2019-09-28 | Discharge: 2019-09-28 | Disposition: A | Discharge status: Home / Self Care | Payer: Self-pay | Attending: Emergency Medicine | Admitting: Emergency Medicine

## 2019-09-28 ENCOUNTER — Other Ambulatory Visit: Payer: Self-pay

## 2019-09-28 ENCOUNTER — Encounter: Payer: Self-pay | Admitting: Emergency Medicine

## 2019-09-28 DIAGNOSIS — K029 Dental caries, unspecified: Secondary | ICD-10-CM

## 2019-09-28 DIAGNOSIS — Z79899 Other long term (current) drug therapy: Secondary | ICD-10-CM | POA: Insufficient documentation

## 2019-09-28 DIAGNOSIS — F17210 Nicotine dependence, cigarettes, uncomplicated: Secondary | ICD-10-CM | POA: Insufficient documentation

## 2019-09-28 MED ORDER — AMOXICILLIN 500 MG PO CAPS: 500.0000 mg | ORAL_CAPSULE | Freq: Three times a day (TID) | ORAL | 0 refills | Status: DC

## 2019-09-28 MED ORDER — AMOXICILLIN 500 MG PO CAPS
500.0000 mg | ORAL_CAPSULE | Freq: Once | ORAL | Status: AC
  Administered 2019-09-28: 500 mg via ORAL
  Filled 2019-09-28: qty 1

## 2019-09-28 MED ORDER — LIDOCAINE-EPINEPHRINE 2 %-1:100000 IJ SOLN
1.7000 mL | Freq: Once | INTRAMUSCULAR | Status: AC
  Administered 2019-09-28: 1.7 mL
  Filled 2019-09-28: qty 1.7

## 2019-09-28 NOTE — ED Triage Notes (Signed)
Pt in via POV, reports left jaw pain/swelling x 3 days.  Denies dental pain.  No apparent swelling noted.  NAD noted at this time.

## 2019-09-28 NOTE — Discharge Instructions (Signed)
You are being treated for a dental abscess. Take the antibiotic until all pills are gone. Use a soft-bristled toothbrush at least twice a day. Rinse with warm-salty water after every meal. Follow-up with one of the dental provider for dental filling or extraction.

## 2019-09-30 NOTE — ED Provider Notes (Signed)
[facility] Emergency Department Provider Note ____________________________________________  Time seen: 1740  I have reviewed the triage vital signs and the nursing notes.  HISTORY  Chief Complaint  Jaw Pain   HPI Regina Carpenter is a 43 y.o. female presents with self to the ED for evaluation of left upper jaw pain.  Patient describes  a chronically broken second molar, where she localizes pain patient denies any purulent drainage, difficulty swallowing or controlling his oral secretions patient is also denied any fevers, chest pain, shortness of breath.  Past Medical History:  Diagnosis Date  . Arthralgia of right knee   . Arthritis   . Cellulitis of left leg   . Lymphadenopathy, mesenteric   . Right hand pain   . Tobacco abuse     Patient Active Problem List   Diagnosis Date Noted  . Hyponatremia 11/25/2016  . Hypokalemia 11/25/2016  . Leukocytosis 11/25/2016  . Generalized weakness 11/25/2016  . Generalized pain 11/25/2016  . Conjunctivitis 11/25/2016  . Scleritis and episcleritis of right eye   . 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 Abdominal pain 11/14/2016  . Lymphadenopathy, mesenteric   . Arthralgia of right knee 04/02/2015  . Cellulitis of left leg 03/21/2015  . Right hand pain 03/21/2015  . Tobacco use 03/21/2015    Past Surgical History:  Procedure Laterality Date  . denies      Prior to Admission medications   Medication Sig Start Date End Date Taking? Authorizing Provider  amoxicillin (AMOXIL) 500 MG capsule Take 1 capsule (500 mg total) by mouth 3 (three) times daily. 09/28/19   Jaclynn Laumann, Dannielle Karvonen, PA-C  cyclobenzaprine (FLEXERIL) 10 MG tablet Take 1 tablet (10 mg total) by mouth 3 (three) times daily as needed. 08/06/19   Sable Feil, PA-C  folic acid (FOLVITE) 1 MG tablet Take 1 mg by mouth daily.    [provider]  methotrexate (RHEUMATREX) 2.5 MG tablet Take 2.5 mg by mouth once a week.  Caution:Chemotherapy. Protect from light.    [provider]  predniSONE (DELTASONE) 5 MG tablet Take 5 mg by mouth daily with breakfast.    [provider]  traMADol (ULTRAM) 50 MG tablet Take 1 tablet (50 mg total) by mouth every 6 (six) hours as needed for moderate pain. 08/06/19   Sable Feil, PA-C    Allergies Ibuprofen and Sulfasalazine  Family History  Problem Relation Age of Onset  . Leukemia Maternal Grandfather   . Alcohol abuse Father     Social History Social History   Tobacco Use  . Smoking status: Current Every Day Smoker    Packs/day: 0.50    Types: Cigarettes  . Smokeless tobacco: Never Used  Substance Use Topics  . Alcohol use: No  . Drug use: No    Review of Systems  Constitutional: Negative for fever. Eyes: Negative for visual changes. ENT: Negative for sore throat.  Left upper dental pain as above. Cardiovascular: Negative for chest pain. Respiratory: Negative for shortness of breath. Musculoskeletal: Negative for back pain. Skin: Negative for rash. Neurological: Negative for headaches, focal weakness or numbness. ____________________________________________  PHYSICAL EXAM:  VITAL SIGNS: ED Triage Vitals  Enc Vitals Group     BP 09/28/19 1657 134/87     Pulse Rate 09/28/19 1657 80     Resp 09/28/19 1657 16     Temp 09/28/19 1657 98.8 F (37.1 C)     Temp Source 09/28/19 1657 Oral     SpO2 09/28/19 1657  100 %     Weight 09/28/19 1658 175 lb (79.4 kg)     Height 09/28/19 1658 5\' 5"  (1.651 m)     Head Circumference --      Peak Flow --      Pain Score 09/28/19 1658 10     Pain Loc --      Pain Edu? --      Excl. in [place]? --     Constitutional: Alert and oriented. Well appearing and in no distress. Head: Normocephalic and atraumatic. Eyes: Conjunctivae are normal. Normal extraocular movements Ears: Canals clear. TMs intact bilaterally. Mouth/Throat: Mucous membranes are moist.  Uvula is midline and tonsils are flat.   No oral lesions are appreciated.  Patient with a chronically broken second molar to the upper jaw on presentation.  No focal gum swelling is appreciated.  No brawny sublingual erythema is noted. Neck: Supple. No thyromegaly. Hematological/Lymphatic/Immunological: No cervical lymphadenopathy. Cardiovascular: Normal rate, regular rhythm. Normal distal pulses. Respiratory: Normal respiratory effort.  ____________________________________________  PROCEDURES  Amoxicillin 500 mg PO Dental Block  Date/Time: 09/28/2019 6:37 PM Performed by: Melvenia Needles, PA-C Authorized by: Melvenia Needles, PA-C   Consent:    Consent obtained:  Verbal   Consent given by:  Patient   Risks discussed:  Pain   Alternatives discussed:  No treatment Indications:    Indications: dental pain   Location:    Block type:  Supraperiosteal   Supraperiosteal location:  Upper teeth   Upper teeth location:  15/LU 2nd molar Procedure details (see MAR for exact dosages):    Syringe type:  Controlled syringe   Needle gauge:  27 G   Anesthetic injected:  Lidocaine 2% WITH epi   Injection procedure:  Anatomic landmarks identified, introduced needle and incremental injection Post-procedure details:    Outcome:  Anesthesia achieved   Patient tolerance of procedure:  Tolerated well, no immediate complications   ____________________________________________  INITIAL IMPRESSION / ASSESSMENT AND PLAN / ED COURSE  Patient with ED evaluation of acute dental pain to the left upper jaw.  Patient treated empirically for dental infection secondary to a chronically broken second molar.  She will be discharged with amoxicillin to take as directed.  She is given a dental block in the ED which provides significant and instant pain relief.  She is referred to any number dental providers for definitive management.  Return precautions have been reviewed.  Regina Carpenter was evaluated in Emergency Department on  09/30/2019 for the symptoms described in the history of present illness. She was evaluated in the context of the global COVID-19 pandemic, which necessitated consideration that the patient might be at risk for infection with the SARS-CoV-2 virus that causes COVID-19. Institutional protocols and algorithms that pertain to the evaluation of patients at risk for COVID-19 are in a state of rapid change based on information released by regulatory bodies including the CDC and federal and state organizations. These policies and algorithms were followed during the patient's care in the ED. ____________________________________________  FINAL CLINICAL IMPRESSION(S) / ED DIAGNOSES  Final diagnoses:  Pain due to dental caries      Melvenia Needles, PA-C 09/30/19 2139    Arta Silence, MD 10/07/19 [phone]

## 2019-12-16 ENCOUNTER — Emergency Department: Payer: Self-pay

## 2019-12-16 ENCOUNTER — Other Ambulatory Visit: Payer: Self-pay

## 2019-12-16 ENCOUNTER — Encounter: Payer: Self-pay | Admitting: Emergency Medicine

## 2019-12-16 ENCOUNTER — Emergency Department
Admission: EM | Admit: 2019-12-16 | Discharge: 2019-12-16 | Disposition: A | Discharge status: Home / Self Care | Payer: Self-pay | Attending: Emergency Medicine | Admitting: Emergency Medicine

## 2019-12-16 DIAGNOSIS — M25511 Pain in right shoulder: Secondary | ICD-10-CM | POA: Insufficient documentation

## 2019-12-16 DIAGNOSIS — Z79899 Other long term (current) drug therapy: Secondary | ICD-10-CM | POA: Insufficient documentation

## 2019-12-16 DIAGNOSIS — F17210 Nicotine dependence, cigarettes, uncomplicated: Secondary | ICD-10-CM | POA: Insufficient documentation

## 2019-12-16 MED ORDER — KETOROLAC TROMETHAMINE 30 MG/ML IJ SOLN
30.0000 mg | Freq: Once | INTRAMUSCULAR | Status: AC
  Administered 2019-12-16: 30 mg via INTRAMUSCULAR
  Filled 2019-12-16: qty 1

## 2019-12-16 MED ORDER — TRAMADOL HCL 50 MG PO TABS: 50.0000 mg | ORAL_TABLET | Freq: Four times a day (QID) | ORAL | 0 refills | Status: DC | PRN

## 2019-12-16 MED ORDER — MELOXICAM 15 MG PO TABS: 15.0000 mg | ORAL_TABLET | Freq: Every day | ORAL | 0 refills | Status: DC

## 2019-12-16 NOTE — Discharge Instructions (Addendum)
Follow-up with your rheumatologist if any continued problems and continue taking your regular medication.  A prescription for meloxicam was sent to your pharmacy which is 1 time a day with food.  The tramadol is every 6 hours if needed for pain.  Do not take the tramadol if you plan on driving or operating machinery as it could cause drowsiness.  You may use ice or heat to your shoulder as needed for discomfort.  You may also continue taking Tylenol with this medication if additional pain medication is needed.

## 2019-12-16 NOTE — ED Triage Notes (Signed)
Pt arrives POV to triage with c/o right shoulder pain x 2 days. Pt denies injury or trauma and is in NAD.

## 2019-12-16 NOTE — ED Provider Notes (Signed)
[facility] Emergency Department Provider Note  ____________________________________________   First MD Initiated Contact with Patient 12/16/19 0715     (approximate)  I have reviewed the triage vital signs and the nursing notes.   HISTORY  Chief Complaint Shoulder Pain   HPI Regina Carpenter is a 44 y.o. female presents to the ED with complaint of right shoulder pain without history of injury.  Patient does have a history of inflammatory arthritis and is being seen in the rheumatology department at [facility].  She states she has been taking Tylenol without any relief.  Patient states she is unable to take ibuprofen due to nausea but denies any gastric ulcers.  She rates her pain as a 10/10.       Past Medical History:  Diagnosis Date  . Arthralgia of right knee   . Arthritis   . Cellulitis of left leg   . Lymphadenopathy, mesenteric   . Right hand pain   . Tobacco abuse     Patient Active Problem List   Diagnosis Date Noted  . Hyponatremia 11/25/2016  . Hypokalemia 11/25/2016  . Leukocytosis 11/25/2016  . Generalized weakness 11/25/2016  . Generalized pain 11/25/2016  . Conjunctivitis 11/25/2016  . Scleritis and episcleritis of right eye   . 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 Abdominal pain 11/14/2016  . Lymphadenopathy, mesenteric   . Arthralgia of right knee 04/02/2015  . Cellulitis of left leg 03/21/2015  . Right hand pain 03/21/2015  . Tobacco use 03/21/2015    Past Surgical History:  Procedure Laterality Date  . denies      Prior to Admission medications   Medication Sig Start Date End Date Taking? Authorizing Provider  folic acid (FOLVITE) 1 MG tablet Take 1 mg by mouth daily.    [provider]  meloxicam (MOBIC) 15 MG tablet Take 1 tablet (15 mg total) by mouth daily. 12/16/19 12/15/20  Johnn Hai, PA-C  methotrexate (RHEUMATREX) 2.5 MG tablet Take 2.5 mg by mouth once a week.  Caution:Chemotherapy. Protect from light.    [provider]  predniSONE (DELTASONE) 5 MG tablet Take 5 mg by mouth daily with breakfast.    [provider]  traMADol (ULTRAM) 50 MG tablet Take 1 tablet (50 mg total) by mouth every 6 (six) hours as needed. 12/16/19   Johnn Hai, PA-C    Allergies Ibuprofen and Sulfasalazine  Family History  Problem Relation Age of Onset  . Leukemia Maternal Grandfather   . Alcohol abuse Father     Social History Social History   Tobacco Use  . Smoking status: Current Every Day Smoker    Packs/day: 0.50    Types: Cigarettes  . Smokeless tobacco: Never Used  Substance Use Topics  . Alcohol use: No  . Drug use: No    Review of Systems Constitutional: No fever/chills Eyes: No visual changes. ENT: No sore throat. Cardiovascular: Denies chest pain.   Respiratory: Denies shortness of breath.  Negative for cough. Gastrointestinal: No abdominal pain.  No nausea, no vomiting.   Genitourinary: Negative for dysuria. Musculoskeletal: Positive for right shoulder pain. Skin: Negative for rash. Neurological: Negative for headaches, focal weakness or numbness.  ____________________________________________   PHYSICAL EXAM:  VITAL SIGNS: ED Triage Vitals  Enc Vitals Group     BP 12/16/19 0703 120/78     Pulse Rate 12/16/19 0703 81     Resp 12/16/19 0703 18     Temp 12/16/19 0703 98.5 F (36.9 C)  Temp Source 12/16/19 0703 Oral     SpO2 12/16/19 0703 100 %     Weight 12/16/19 0700 175 lb (79.4 kg)     Height 12/16/19 0700 5\' 5"  (1.651 m)     Head Circumference --      Peak Flow --      Pain Score 12/16/19 0700 10     Pain Loc --      Pain Edu? --      Excl. in [place]? --    Constitutional: Alert and oriented. Well appearing and in no acute distress. Eyes: Conjunctivae are normal.  Head: Atraumatic. Neck: No stridor.   Cardiovascular: Normal rate, regular rhythm. Grossly normal heart sounds.  Good peripheral  circulation. Respiratory: Normal respiratory effort.  No retractions. Lungs CTAB. Musculoskeletal: Examination of the right shoulder there is no gross deformity however there is moderate tenderness on palpation of the [facility] joint and anterior aspect.  Range of motion is slow and guarded but no crepitus was noted.  Patient is able to slowly move in all 4 planes with discomfort.  Skin is intact and without warmth.  No abrasions were noted.  Pulses present distally and motor sensory function intact.  Nontender elbow area and range of motion is without pain. Neurologic:  Normal speech and language. No gross focal neurologic deficits are appreciated. No gait instability. Skin:  Skin is warm, dry and intact. No rash noted. Psychiatric: Mood and affect are normal. Speech and behavior are normal.  ____________________________________________   LABS (all labs ordered are listed, but only abnormal results are displayed)  Labs Reviewed - No data to display ____________________________________________   RADIOLOGY  Official radiology report(s): DG Shoulder Right  Result Date: 12/16/2019 CLINICAL DATA:  Right shoulder pain x2 days EXAM: RIGHT SHOULDER - 2+ VIEW COMPARISON:  None. FINDINGS: No fracture or dislocation is seen. The joint spaces are preserved. The visualized soft tissues are unremarkable. IMPRESSION: Negative. Electronically Signed   By: Julian Hy M.D.   On: 12/16/2019 07:36    ____________________________________________   PROCEDURES  Procedure(s) performed (including Critical Care):  Procedures   ____________________________________________   INITIAL IMPRESSION / ASSESSMENT AND PLAN / ED COURSE  As part of my medical decision making, I reviewed the following data within the electronic MEDICAL RECORD NUMBER Notes from prior ED visits and [place] Controlled Substance Database   44 year old female presents to the ED with complaint of right shoulder pain.  Patient states that there  was no history of injury.  She states pain began 2 days ago and she has been taking over-the-counter medication without any relief.  Patient was given Toradol 30 mg IM prior to x-ray.  X-ray was reassuring and patient was made aware that there was no acute changes.  A prescription for meloxicam was sent to her pharmacy and patient is instructed to eat before taking it.  Also a prescription for tramadol if needed for moderate to severe pain.  Ice or heat to the area.  Follow-up with her rheumatologist if any continued problems.   ____________________________________________   FINAL CLINICAL IMPRESSION(S) / ED DIAGNOSES  Final diagnoses:  Acute pain of right shoulder     ED Discharge Orders         Ordered    meloxicam (MOBIC) 15 MG tablet  Daily     12/16/19 0747    traMADol (ULTRAM) 50 MG tablet  Every 6 hours PRN     12/16/19 0815  Note:  This document was prepared using Dragon voice recognition software and may include unintentional dictation errors.    Johnn Hai, PA-C 12/16/19 1021    Duffy Bruce, MD 12/17/19 1010

## 2020-01-16 ENCOUNTER — Ambulatory Visit: Payer: Self-pay | Admitting: Physician Assistant

## 2020-01-16 ENCOUNTER — Other Ambulatory Visit: Payer: Self-pay

## 2020-01-16 DIAGNOSIS — Z113 Encounter for screening for infections with a predominantly sexual mode of transmission: Secondary | ICD-10-CM

## 2020-01-16 LAB — WET PREP FOR TRICH, YEAST, CLUE
Trichomonas Exam: NEGATIVE
Yeast Exam: NEGATIVE

## 2020-01-18 ENCOUNTER — Encounter: Payer: Self-pay | Admitting: Physician Assistant

## 2020-01-18 NOTE — Progress Notes (Signed)
[facility] Department STI clinic/screening visit  Subjective:  Regina Carpenter is a 44 y.o. female being seen today for an STI screening visit. The patient reports they do not have symptoms.  Patient reports that they do not desire a pregnancy in the next year.   They reported they are not interested in discussing contraception today.  No LMP recorded.   Patient has the following medical conditions:   Patient Active Problem List   Diagnosis Date Noted  . Hyponatremia 11/25/2016  . Hypokalemia 11/25/2016  . Leukocytosis 11/25/2016  . Generalized weakness 11/25/2016  . Generalized pain 11/25/2016  . Conjunctivitis 11/25/2016  . Scleritis and episcleritis of right eye   . 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 Abdominal pain 11/14/2016  . Lymphadenopathy, mesenteric   . Arthralgia of right knee 04/02/2015  . Cellulitis of left leg 03/21/2015  . Right hand pain 03/21/2015  . Tobacco use 03/21/2015    Chief Complaint  Patient presents with  . SEXUALLY TRANSMITTED DISEASE    HPI  Patient reports that she is not having any symptoms but would like to have a screening.  States usually has screening at least one time a year but has a new partner.  LMP 12/24/2019 and normal.  Using condoms as BCM.  See flowsheet for further details and programmatic requirements.    The following portions of the patient's history were reviewed and updated as appropriate: allergies, current medications, past medical history, past social history, past surgical history and problem list.  Objective:  There were no vitals filed for this visit.  Physical Exam Constitutional:      General: She is not in acute distress.    Appearance: Normal appearance.  HENT:     Head: Normocephalic and atraumatic.     Comments: No nits, lice, or hair loss. No cervical, supraclavicular or axillary adenopathy.    Mouth/Throat:     Mouth: Mucous membranes are moist.     Pharynx: Oropharynx is  clear. No oropharyngeal exudate or posterior oropharyngeal erythema.  Eyes:     Conjunctiva/sclera: Conjunctivae normal.  Pulmonary:     Effort: Pulmonary effort is normal.  Abdominal:     Palpations: Abdomen is soft. There is no mass.     Tenderness: There is no abdominal tenderness. There is no guarding or rebound.  Genitourinary:    General: Normal vulva.     Rectum: Normal.     Comments: External genitalia/pubic area without nits, lice, edema, erythema, lesions and inguinal adenopathy. Vagina with normal mucosa and discharge. Cervix without visible lesions. Uterus firm, mobile, nt, no masses, no CMT, no adnexal tenderness or fullness. Musculoskeletal:     Cervical back: Neck supple. No tenderness.  Skin:    General: Skin is warm and dry.     Findings: No bruising, erythema, lesion or rash.  Neurological:     Mental Status: She is alert and oriented to person, place, and time.  Psychiatric:        Mood and Affect: Mood normal.        Behavior: Behavior normal.        Thought Content: Thought content normal.        Judgment: Judgment normal.      Assessment and Plan:  Regina Carpenter is a 44 y.o. female presenting to the [facility] Department for STI screening  1. Screening for STD (sexually transmitted disease) Patient into clinic without symptoms. Rec condoms with all sex. Await test results.  Counseled that RN  will call if needs to RTC for treatment once results are back. - WET PREP FOR [place], [place], [place] [facility] - Syphilis Serology, [facility]     No follow-ups on file.  No future appointments.  Regina Dilling, PA

## 2020-01-29 ENCOUNTER — Encounter: Payer: Self-pay | Admitting: *Deleted

## 2020-01-29 ENCOUNTER — Emergency Department
Admission: EM | Admit: 2020-01-29 | Discharge: 2020-01-30 | Disposition: A | Discharge status: Home / Self Care | Payer: Self-pay | Attending: Emergency Medicine | Admitting: Emergency Medicine

## 2020-01-29 ENCOUNTER — Other Ambulatory Visit: Payer: Self-pay

## 2020-01-29 DIAGNOSIS — Z79899 Other long term (current) drug therapy: Secondary | ICD-10-CM | POA: Insufficient documentation

## 2020-01-29 DIAGNOSIS — K029 Dental caries, unspecified: Secondary | ICD-10-CM | POA: Insufficient documentation

## 2020-01-29 DIAGNOSIS — K047 Periapical abscess without sinus: Secondary | ICD-10-CM | POA: Insufficient documentation

## 2020-01-29 DIAGNOSIS — F17210 Nicotine dependence, cigarettes, uncomplicated: Secondary | ICD-10-CM | POA: Insufficient documentation

## 2020-01-29 MED ORDER — AMOXICILLIN 500 MG PO CAPS: 500.0000 mg | ORAL_CAPSULE | Freq: Three times a day (TID) | ORAL | 0 refills | Status: AC

## 2020-01-29 MED ORDER — AMOXICILLIN 500 MG PO CAPS
500.0000 mg | ORAL_CAPSULE | Freq: Once | ORAL | Status: AC
  Administered 2020-01-29: 500 mg via ORAL
  Filled 2020-01-29: qty 1

## 2020-01-29 MED ORDER — LIDOCAINE-EPINEPHRINE 2 %-1:100000 IJ SOLN
1.7000 mL | Freq: Once | INTRAMUSCULAR | Status: AC
  Administered 2020-01-29: 1.7 mL
  Filled 2020-01-29: qty 1.7

## 2020-01-29 NOTE — Discharge Instructions (Signed)
OPTIONS FOR DENTAL FOLLOW UP CARE ° °[place] Department of Health and Human Services - Local Safety Net Dental Clinics °http://www.ncdhhs.gov/dph/oralhealth/services/safetynetclinics.htm °  °[facility] (336-562-3123) ° °[facility] ([phone]) ° °[facility] ([phone] ext [phone]) ° °[facility] ([phone]) ° °[facility] ([phone]) °This clinic caters to the indigent population and is on a lottery system. °Location: °[facility], [address], [place] °Clinic Hours: °Wednesdays from 6pm - 9pm, patients seen by a lottery system. °For dates, call or go to www.med.[facility].edu/[facility]/patients/Dental-[facility] °Services: °Cleanings, fillings and simple extractions. °Payment Options: °DENTAL WORK IS FREE OF CHARGE. Bring proof of income or support. °Best way to get seen: °Arrive at 5:15 pm - this is a lottery, NOT first come/first serve, so arriving earlier will not increase your chances of being seen. °  °  °[facility] °[phone] °Select option 1 for emergencies °  °Location: °[facility], [address], [place] °Clinic Hours: °No walk-ins accepted - call the day before to schedule an appointment. °Check in times are 9:30 am and 1:30 pm. °Services: °Simple extractions, temporary fillings, pulpectomy/pulp debridement, uncomplicated abscess drainage. °Payment Options: °PAYMENT IS DUE AT THE TIME OF SERVICE.  Fee is usually $100-200, additional surgical procedures (e.g. abscess drainage) may be extra. °Cash, checks, Visa/MasterCard accepted.  Can file Medicaid if patient is covered for dental - patient should call case worker to check. °No discount for [facility] patients. °Best way to get seen: °MUST call the day before and get onto the schedule. Can usually be seen the next 1-2 days. No walk-ins accepted. °  °  °[facility] °[phone] °   °Location: °[facility], [address], [place] °Clinic Hours: °M, W, Th, F 8am or 1:30pm, Tues 9a or 1:30 - first come/first served. °Services: °Simple extractions, temporary fillings, uncomplicated abscess drainage.  You do not need to be an [place] resident. °Payment Options: °PAYMENT IS DUE AT THE TIME OF SERVICE. °Dental insurance, otherwise sliding scale - bring proof of income or support. °Depending on income and treatment needed, cost is usually $50-200. °Best way to get seen: °Arrive early as it is first come/first served. °  °  °[facility] °[phone] °  °Location: °[address] °Clinic Hours: °Mon-Thu 8a-5p °Services: °Most basic dental services including extractions and fillings. °Payment Options: °PAYMENT IS DUE AT THE TIME OF SERVICE. °Sliding scale, up to 50% off - bring proof if income or support. °Medicaid with dental option accepted. °Best way to get seen: °Call to schedule an appointment, can usually be seen within 2 weeks OR they will try to see walk-ins - show up at 8a or 2p (you may have to wait). °  °  °[facility] °[phone] °[place] RESIDENTS ONLY °  °Location: °[facility], [address], [place], [place] [address] °Clinic Hours: By appointment only. °Monday - Thursday 8am-5pm, Friday 8am-12pm °Services: Cleanings, fillings, extractions. °Payment Options: °PAYMENT IS DUE AT THE TIME OF SERVICE. °Cash, Visa or MasterCard. Sliding scale - $30 minimum per service. °Best way to get seen: °Come in to office, complete packet and make an appointment - need proof of income °or support monies for each household member and proof of [place] residence. °Usually takes about a month to get in. °  °  °[facility] °[phone] °  °Location: °[address]   [place] °Clinic Hours: Walk-in Urgent Care Dental Services are offered Monday-Friday  mornings only. °The numbers of emergencies accepted daily is limited to the number of °providers available. °Maximum 15 - Mondays, Wednesdays & Thursdays °Maximum 10 - Tuesdays & Fridays °Services: °You do not need to be a  [place] resident to be seen for a dental emergency. °Emergencies are defined as pain, swelling, abnormal bleeding, or dental trauma. Walkins will receive x-rays if needed. °NOTE: Dental cleaning is not an emergency. °Payment Options: °PAYMENT IS DUE AT THE TIME OF SERVICE. °Minimum co-pay is $40.00 for uninsured patients. °Minimum co-pay is $3.00 for Medicaid with dental coverage. °Dental Insurance is accepted and must be presented at time of visit. °Medicare does not cover dental. °Forms of payment: Cash, credit card, checks. °Best way to get seen: °If not previously registered with the clinic, walk-in dental registration begins at 7:15 am and is on a first come/first serve basis. °If previously registered with the clinic, call to make an appointment. °  °  °[facility] °[phone] °[place] RESIDENTS ONLY °  °Location: °[address], [place], [place] °Clinic Hours: °Mon-Thu 10a-2p °Services: Extractions only! °Payment Options: °FREE (donations accepted) - bring proof of income or support °Best way to get seen: °Call and schedule an appointment OR come at 8am on the 1st Monday of every month (except for holidays) when it is first come/first served. °  °  °[facility] °[phone] °  °Location: °[address], [place] °Clinic Hours: °Friday mornings °Services, Payment Options, Best way to get seen: °Call for info °

## 2020-01-29 NOTE — ED Provider Notes (Signed)
[facility] Emergency Department Provider Note ____________________________________________  Time seen: 2328  I have reviewed the triage vital signs and the nursing notes.  HISTORY  Chief Complaint  Dental Pain  HPI Regina Carpenter is a 44 y.o. female presents to the ED for evaluation of lower right jaw pain.  Patient reports chronically broken teeth in her lower jaw.  And describes a 2-day complaint of pain on the right side of the lower jaw patient denies difficulty swallowing, breathing, or controlling oral secretions patient denies any fever, chills, sweats patient has no chest pain, spontaneous drainage, or swelling the floor the mouth.  She has not been able to establish dental insurance coverage, and has not seen a dental provider for definitive management.  She denies any other injury at this time.   Past Medical History:  Diagnosis Date  . Arthralgia of right knee   . Arthritis   . Cellulitis of left leg   . Lymphadenopathy, mesenteric   . Right hand pain   . Tobacco abuse     Patient Active Problem List   Diagnosis Date Noted  . Hyponatremia 11/25/2016  . Hypokalemia 11/25/2016  . Leukocytosis 11/25/2016  . Generalized weakness 11/25/2016  . Generalized pain 11/25/2016  . Conjunctivitis 11/25/2016  . Scleritis and episcleritis of right eye   . 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 Abdominal pain 11/14/2016  . Lymphadenopathy, mesenteric   . Arthralgia of right knee 04/02/2015  . Cellulitis of left leg 03/21/2015  . Right hand pain 03/21/2015  . Tobacco use 03/21/2015    Past Surgical History:  Procedure Laterality Date  . denies      Prior to Admission medications   Medication Sig Start Date End Date Taking? Authorizing Provider  amoxicillin (AMOXIL) 500 MG capsule Take 1 capsule (500 mg total) by mouth 3 (three) times daily for 10 days. 01/30/20 02/09/20  Simisola Sandles, Dannielle Karvonen, PA-C  folic acid (FOLVITE) 1 MG tablet Take 1 mg  by mouth daily.    [provider]  meloxicam (MOBIC) 15 MG tablet Take 1 tablet (15 mg total) by mouth daily. 12/16/19 12/15/20  Johnn Hai, PA-C  methotrexate (RHEUMATREX) 2.5 MG tablet Take 2.5 mg by mouth once a week. Caution:Chemotherapy. Protect from light.    [provider]  predniSONE (DELTASONE) 5 MG tablet Take 5 mg by mouth daily with breakfast.    [provider]  traMADol (ULTRAM) 50 MG tablet Take 1 tablet (50 mg total) by mouth every 6 (six) hours as needed. 12/16/19   Johnn Hai, PA-C    Allergies Ibuprofen and Sulfasalazine  Family History  Problem Relation Age of Onset  . Leukemia Maternal Grandfather   . Alcohol abuse Father     Social History Social History   Tobacco Use  . Smoking status: Current Every Day Smoker    Packs/day: 0.50    Types: Cigarettes  . Smokeless tobacco: Never Used  Substance Use Topics  . Alcohol use: No  . Drug use: No    Review of Systems  Constitutional: Negative for fever. Eyes: Negative for visual changes. ENT: Negative for sore throat. Cardiovascular: Negative for chest pain. Respiratory: Negative for shortness of breath. Gastrointestinal: Negative for abdominal pain, vomiting and diarrhea. Genitourinary: Negative for dysuria. Musculoskeletal: Negative for back pain. Skin: Negative for rash. Neurological: Negative for headaches, focal weakness or numbness. ____________________________________________  PHYSICAL EXAM:  VITAL SIGNS: ED Triage Vitals  Enc Vitals Group     BP 01/29/20  2118 122/80     Pulse Rate 01/29/20 2118 87     Resp 01/29/20 2118 20     Temp 01/29/20 2118 98.9 F (37.2 C)     Temp Source 01/29/20 2118 Oral     SpO2 01/29/20 2118 100 %     Weight 01/29/20 2100 176 lb 9.6 oz (80.1 kg)     Height --      Head Circumference --      Peak Flow --      Pain Score 01/29/20 2126 10     Pain Loc --      Pain Edu? --      Excl. in [place]? --     Constitutional:  Alert and oriented. Well appearing and in no distress. Head: Normocephalic and atraumatic. Eyes: Conjunctivae are normal. PERRL. Normal extraocular movements Ears: Canals clear. TMs intact bilaterally. Mouth/Throat: Mucous membranes are moist.  Uvula is midline and tonsils are flat.  No oropharyngeal lesions are appreciated.  No brawny sublingual edema is noted.  Patient with multiple chronically broken lower teeth at the gumline.  No focal gum swelling or purulence is noted. Neck: Supple. No thyromegaly. Hematological/Lymphatic/Immunological: No cervical lymphadenopathy. Cardiovascular: Normal rate, regular rhythm. Normal distal pulses. Respiratory: Normal respiratory effort.  Musculoskeletal: Nontender with normal range of motion in all extremities.  Neurologic:  Normal gait without ataxia. Normal speech and language. No gross focal neurologic deficits are appreciated. Skin:  Skin is warm, dry and intact. No rash noted. ____________________________________________  PROCEDURES  amoxicillin  500 mg PO  Dental Block  Date/Time: 01/29/2020 11:36 PM Performed by: Melvenia Needles, PA-C Authorized by: Melvenia Needles, PA-C   Consent:    Consent obtained:  Verbal   Consent given by:  Patient   Risks discussed:  Pain and unsuccessful block Indications:    Indications: dental pain   Location:    Block type:  Supraperiosteal   Supraperiosteal location:  Lower teeth   Lower teeth location:  29/RL 2nd bicuspid Procedure details (see MAR for exact dosages):    Syringe type:  Controlled syringe   Needle gauge:  27 G   Anesthetic injected:  Lidocaine 2% WITH epi   Injection procedure:  Anatomic landmarks identified, introduced needle and incremental injection Post-procedure details:    Outcome:  Anesthesia achieved   Patient tolerance of procedure:  Tolerated well, no immediate complications   ____________________________________________  INITIAL IMPRESSION / ASSESSMENT  AND PLAN / ED COURSE  Patient with ED evaluation of dental pain for the last 2 days.  Patient with chronically broken teeth to the mandible bilaterally, presents with right lower jaw pain.  Denies any other injury at this time.  She was treated empirically with amoxicillin and pain is controlled with a dental block.  She will follow-up with a local dental provider as discussed.  Airanna Hainline was evaluated in Emergency Department on 01/29/2020 for the symptoms described in the history of present illness. She was evaluated in the context of the global COVID-19 pandemic, which necessitated consideration that the patient might be at risk for infection with the SARS-CoV-2 virus that causes COVID-19. Institutional protocols and algorithms that pertain to the evaluation of patients at risk for COVID-19 are in a state of rapid change based on information released by regulatory bodies including the CDC and federal and state organizations. These policies and algorithms were followed during the patient's care in the ED. ____________________________________________  FINAL CLINICAL IMPRESSION(S) / ED DIAGNOSES  Final diagnoses:  Pain due to dental caries  Dental abscess      Melvenia Needles, PA-C 01/29/20 2357    Duffy Bruce, MD 01/30/20 [phone]

## 2020-01-29 NOTE — ED Triage Notes (Signed)
Pt has lower jaw pain for 2 days.  Pain worse today.  Taking otc meds without relief.  Pt alert.

## 2020-02-19 ENCOUNTER — Telehealth: Payer: Self-pay | Admitting: Family Medicine

## 2020-02-19 NOTE — Telephone Encounter (Signed)
RESULTS FROM STD PLEASE CALL PT.

## 2020-02-19 NOTE — Telephone Encounter (Addendum)
Returned patient phone call to provider number. Spoke with patient, correct chart password given. All STD test results given from 01/16/2020 STD screening appt. Patient requesting a copy of TR. Advised she would need to come in and sign a ROI to receive copy of TR. Patient verbalized understanding of same and states she cannot come today but will try to come tomorrow. Explained to patient ROI and copies of TR from 01/16/20 OV will be available at reception desk once ROI is signed. Patient verbalized understanding of same. Hal Morales, RN

## 2020-03-01 ENCOUNTER — Emergency Department: Payer: Self-pay

## 2020-03-01 ENCOUNTER — Emergency Department
Admission: EM | Admit: 2020-03-01 | Discharge: 2020-03-01 | Disposition: A | Discharge status: Home / Self Care | Payer: Self-pay | Attending: Emergency Medicine | Admitting: Emergency Medicine

## 2020-03-01 ENCOUNTER — Other Ambulatory Visit: Payer: Self-pay

## 2020-03-01 DIAGNOSIS — T148XXA Other injury of unspecified body region, initial encounter: Secondary | ICD-10-CM

## 2020-03-01 DIAGNOSIS — M79674 Pain in right toe(s): Secondary | ICD-10-CM | POA: Insufficient documentation

## 2020-03-01 DIAGNOSIS — Y939 Activity, unspecified: Secondary | ICD-10-CM | POA: Insufficient documentation

## 2020-03-01 DIAGNOSIS — R6889 Other general symptoms and signs: Secondary | ICD-10-CM

## 2020-03-01 DIAGNOSIS — Y999 Unspecified external cause status: Secondary | ICD-10-CM | POA: Insufficient documentation

## 2020-03-01 DIAGNOSIS — R9430 Abnormal result of cardiovascular function study, unspecified: Secondary | ICD-10-CM | POA: Insufficient documentation

## 2020-03-01 DIAGNOSIS — Y929 Unspecified place or not applicable: Secondary | ICD-10-CM | POA: Insufficient documentation

## 2020-03-01 DIAGNOSIS — Z79899 Other long term (current) drug therapy: Secondary | ICD-10-CM | POA: Insufficient documentation

## 2020-03-01 DIAGNOSIS — F17210 Nicotine dependence, cigarettes, uncomplicated: Secondary | ICD-10-CM | POA: Insufficient documentation

## 2020-03-01 DIAGNOSIS — X58XXXA Exposure to other specified factors, initial encounter: Secondary | ICD-10-CM | POA: Insufficient documentation

## 2020-03-01 DIAGNOSIS — S90129A Contusion of unspecified lesser toe(s) without damage to nail, initial encounter: Secondary | ICD-10-CM | POA: Insufficient documentation

## 2020-03-01 HISTORY — DX: Systemic involvement of connective tissue, unspecified: M35.9

## 2020-03-01 LAB — BASIC METABOLIC PANEL
Anion gap: 7 (ref 5–15)
BUN: 11 mg/dL (ref 6–20)
CO2: 24 mmol/L (ref 22–32)
Calcium: 9 mg/dL (ref 8.9–10.3)
Chloride: 106 mmol/L (ref 98–111)
Creatinine, Ser: 0.8 mg/dL (ref 0.44–1.00)
GFR calc Af Amer: >60 mL/min (ref >60)
GFR calc non Af Amer: >60 mL/min (ref >60)
Glucose, Bld: 89 mg/dL (ref 70–99)
Potassium: 4.3 mmol/L (ref 3.5–5.1)
Sodium: 137 mmol/L (ref 135–145)

## 2020-03-01 LAB — CBC
HCT: 32.8 % — ABNORMAL LOW (ref 36.0–46.0)
Hemoglobin: 10.4 g/dL — ABNORMAL LOW (ref 12.0–15.0)
MCH: 24.6 pg — ABNORMAL LOW (ref 26.0–34.0)
MCHC: 31.7 g/dL (ref 30.0–36.0)
MCV: 77.5 fL — ABNORMAL LOW (ref 80.0–100.0)
Platelets: 342 10*3/uL (ref 150–400)
RBC: 4.23 MIL/uL (ref 3.87–5.11)
RDW: 16.3 % — ABNORMAL HIGH (ref 11.5–15.5)
WBC: 11.2 10*3/uL — ABNORMAL HIGH (ref 4.0–10.5)
nRBC: 0 % (ref 0.0–0.2)

## 2020-03-01 MED ORDER — ACETAMINOPHEN 325 MG PO TABS
650.0000 mg | ORAL_TABLET | Freq: Once | ORAL | Status: AC
  Administered 2020-03-01: 650 mg via ORAL
  Filled 2020-03-01: qty 2

## 2020-03-01 MED ORDER — IOHEXOL 350 MG/ML SOLN
125.0000 mL | Freq: Once | INTRAVENOUS | Status: AC | PRN
  Administered 2020-03-01: 125 mL via INTRAVENOUS
  Filled 2020-03-01: qty 125

## 2020-03-01 NOTE — ED Triage Notes (Signed)
Pt c/o pain and bruising to the right 1st and 2nd toe for the past 2 weeks, denies injury but states she is on her feet a lot.

## 2020-03-01 NOTE — Discharge Instructions (Signed)
One of the test looking at the blood pressure in your arms versus your legs was abnormal.  Please call vascular surgery on Tuesday for a follow-up next week.  Return to the emergency department if your symptoms worsen.

## 2020-03-01 NOTE — ED Notes (Signed)
Xray with pt 

## 2020-03-01 NOTE — ED Notes (Signed)
See triage note. Distal portion of second toe appears bruised and skin is peeling. Capp refill <3 seconds. Pt describes discomfort in 1st and 2nd toes. Pt denies injury or ill fitting shoes.

## 2020-03-01 NOTE — ED Provider Notes (Signed)
[facility] Emergency Department Provider Note  ____________________________________________  Time seen: Approximately 11:33 AM  I have reviewed the triage vital signs and the nursing notes.   HISTORY  Chief Complaint Toe Pain    HPI Regina Carpenter is a 44 y.o. female that presents to the emergency department for evaluation of bruising to the bottom of right great toe for 2 weeks.  Patient states that she is also getting a small spot to her left great toe. The right toe started out as a small spot too as well but has enlarged. Patient did try to take a needle to try to drain the spot.  Toe is painful. No known trauma. She denies any new shoes or walking barefoot. This has never happened before.   Past Medical History:  Diagnosis Date  . Arthralgia of right knee   . Arthritis   . Cellulitis of left leg   . Collagen vascular disease ([place])   . Lymphadenopathy, mesenteric   . Right hand pain   . Tobacco abuse     Patient Active Problem List   Diagnosis Date Noted  . Hyponatremia 11/25/2016  . Hypokalemia 11/25/2016  . Leukocytosis 11/25/2016  . Generalized weakness 11/25/2016  . Generalized pain 11/25/2016  . Conjunctivitis 11/25/2016  . Scleritis and episcleritis of right eye   . 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 Abdominal pain 11/14/2016  . Lymphadenopathy, mesenteric   . Arthralgia of right knee 04/02/2015  . Cellulitis of left leg 03/21/2015  . Right hand pain 03/21/2015  . Tobacco use 03/21/2015    Past Surgical History:  Procedure Laterality Date  . denies      Prior to Admission medications   Medication Sig Start Date End Date Taking? Authorizing Provider  folic acid (FOLVITE) 1 MG tablet Take 1 mg by mouth daily.    [provider]  meloxicam (MOBIC) 15 MG tablet Take 1 tablet (15 mg total) by mouth daily. 12/16/19 12/15/20  Johnn Hai, PA-C  methotrexate (RHEUMATREX) 2.5 MG tablet Take 2.5 mg by mouth  once a week. Caution:Chemotherapy. Protect from light.    [provider]  predniSONE (DELTASONE) 5 MG tablet Take 5 mg by mouth daily with breakfast.    [provider]  traMADol (ULTRAM) 50 MG tablet Take 1 tablet (50 mg total) by mouth every 6 (six) hours as needed. 12/16/19   Johnn Hai, PA-C    Allergies Ibuprofen and Sulfasalazine  Family History  Problem Relation Age of Onset  . Leukemia Maternal Grandfather   . Alcohol abuse Father     Social History Social History   Tobacco Use  . Smoking status: Current Every Day Smoker    Packs/day: 0.50    Types: Cigarettes  . Smokeless tobacco: Never Used  Substance Use Topics  . Alcohol use: No  . Drug use: No     Review of Systems  Constitutional: No fever/chills Cardiovascular: No chest pain. Respiratory:  No SOB. Gastrointestinal: No nausea, no vomiting.  Musculoskeletal: Positive for toe pain. Skin: Negative for rash, abrasions, lacerations, ecchymosis. Neurological: Negative for headaches, numbness or tingling   ____________________________________________   PHYSICAL EXAM:  VITAL SIGNS: ED Triage Vitals  Enc Vitals Group     BP 03/01/20 0955 130/80     Pulse Rate 03/01/20 0955 77     Resp 03/01/20 0955 16     Temp 03/01/20 0955 98.1 F (36.7 C)     Temp Source 03/01/20 0955 Oral  SpO2 03/01/20 0955 100 %     Weight 03/01/20 0956 175 lb (79.4 kg)     Height 03/01/20 0956 5\' 5"  (1.651 m)     Head Circumference --      Peak Flow --      Pain Score 03/01/20 0956 10     Pain Loc --      Pain Edu? --      Excl. in [place]? --      Constitutional: Alert and oriented. Well appearing and in no acute distress. Eyes: Conjunctivae are normal. PERRL. EOMI. Head: Atraumatic. ENT:      Ears:      Nose: No congestion/rhinnorhea.      Mouth/Throat: Mucous membranes are moist.  Neck: No stridor.   Cardiovascular: Normal rate, regular rhythm.  Good peripheral circulation.  Symmetric pedal  pulses bilaterally.  Cap refill less than 2 seconds. Respiratory: Normal respiratory effort without tachypnea or retractions. Lungs CTAB. Good air entry to the bases with no decreased or absent breath sounds. Musculoskeletal: Full range of motion to all extremities. No gross deformities appreciated. Neurologic:  Normal speech and language. No gross focal neurologic deficits are appreciated.  Skin:  Skin is warm, dry.  1 cm x 1 cm area of ecchymosis / blood blister to plantar right great toe.  1 mm x 1 mm pinpoint area of ecchymosis to plantar left great toe. Psychiatric: Mood and affect are normal. Speech and behavior are normal. Patient exhibits appropriate insight and judgement.   ____________________________________________   LABS (all labs ordered are listed, but only abnormal results are displayed)  Labs Reviewed  CBC - Abnormal; Notable for the following components:      Result Value   WBC 11.2 (*)    Hemoglobin 10.4 (*)    HCT 32.8 (*)    MCV 77.5 (*)    MCH 24.6 (*)    RDW 16.3 (*)    All other components within normal limits  BASIC METABOLIC PANEL   ____________________________________________  EKG   ____________________________________________  RADIOLOGY   CT Angio Aortobifemoral W and/or Wo Contrast  Result Date: 03/01/2020 CLINICAL DATA:  Bruising in the right toes. EXAM: CT ANGIOGRAPHY OF ABDOMINAL AORTA WITH ILIOFEMORAL RUNOFF TECHNIQUE: Multidetector CT imaging of the abdomen, pelvis and lower extremities was performed using the standard protocol during bolus administration of intravenous contrast. Multiplanar CT image reconstructions and MIPs were obtained to evaluate the vascular anatomy. CONTRAST:  132mL OMNIPAQUE IOHEXOL 350 MG/ML SOLN COMPARISON:  None. FINDINGS: VASCULAR Aorta: Minimal atherosclerotic disease in the distal abdominal aorta. No evidence for aneurysm or dissection. Celiac: Patent without evidence of aneurysm, dissection, vasculitis or  significant stenosis. SMA: Patent without evidence of aneurysm, dissection, vasculitis or significant stenosis. Renals: Both renal arteries are patent without evidence of aneurysm, dissection, vasculitis, fibromuscular dysplasia or significant stenosis. IMA: Patent without evidence of aneurysm, dissection, vasculitis or significant stenosis. RIGHT Lower Extremity Inflow: Common, internal and external iliac arteries are patent without evidence of aneurysm, dissection, vasculitis or significant stenosis. Outflow: Common, superficial and profunda femoral arteries and the popliteal artery are patent without evidence of aneurysm, dissection, vasculitis or significant stenosis. Runoff: Patent three vessel runoff to the ankle. LEFT Lower Extremity Inflow: Common, internal and external iliac arteries are patent without evidence of aneurysm, dissection, vasculitis or significant stenosis. Outflow: Motion artifact in the left thigh but left SFA and left popliteal artery appear to be widely patent. Runoff: Early takeoff of the anterior tibial artery. Three vessel runoff to the  ankle. Dorsalis pedis artery is patent. Veins: No obvious venous abnormality within the limitations of this arterial phase study. Review of the MIP images confirms the above findings. NON-VASCULAR Lower chest: Focal thickening along the left major fissure on sequence 4, image 2 measures 3 mm and likely an incidental finding. Otherwise, the lung bases are clear. Hepatobiliary: Normal appearance of the liver and gallbladder. Pancreas: Unremarkable. No pancreatic ductal dilatation or surrounding inflammatory changes. Spleen: Normal in size without focal abnormality. Adrenals/Urinary Tract: Normal adrenal glands. Normal appearance of the urinary bladder. 1 cm hypodensity in the right kidney upper pole is suggestive for a renal cyst. No hydronephrosis. No suspicious renal lesions. Stomach/Bowel: Stomach is within normal limits. Appendix appears normal. No  evidence of bowel wall thickening, distention, or inflammatory changes. Lymphatic: Slightly prominent lymph node in the right colic mesentery measures 9 mm in short axis on sequence 4, image 91. Small periaortic lymph nodes. Prominent right groin/upper thigh lymph node measures 1 cm short axis. Overall, no significant abdominopelvic lymphadenopathy. Reproductive: Uterus and bilateral adnexa are unremarkable. Other: Small amount of free fluid in the pelvis which could be physiologic. Musculoskeletal: No acute or significant osseous findings. IMPRESSION: VASCULAR 1. Arterial structures are widely patent without focal stenosis. Minimal atherosclerotic disease in the abdominal aorta. NON-VASCULAR 1. No acute abnormality abdomen or pelvis. 2. Small amount of pelvic free fluid that could be physiologic. 3. Focal thickening or nodularity along the left major fissure is probably an incidental finding. No follow-up needed if patient is low-risk. Non-contrast chest CT can be considered in 12 months if patient is high-risk. This recommendation follows the consensus statement: Guidelines for Management of Incidental Pulmonary Nodules Detected on CT Images: From the Fleischner Society 2017; Radiology 2017; 284:228-243. 4. Mildly prominent inguinal lymph nodes are likely reactive. Electronically Signed   By: Markus Daft M.D.   On: 03/01/2020 13:34   US Venous Img Lower Bilateral  Result Date: 03/01/2020 CLINICAL DATA:  Bruising and toe pain. EXAM: BILATERAL LOWER EXTREMITY VENOUS DOPPLER ULTRASOUND TECHNIQUE: Gray-scale sonography with compression, as well as color and duplex ultrasound, were performed to evaluate the deep venous system(s) from the level of the common femoral vein through the popliteal and proximal calf veins. COMPARISON:  None. FINDINGS: VENOUS Normal compressibility of the common femoral, superficial femoral, and popliteal veins, as well as the visualized calf veins. Visualized portions of profunda femoral  vein and great saphenous vein unremarkable. No filling defects to suggest DVT on grayscale or color Doppler imaging. Doppler waveforms show normal direction of venous flow, normal respiratory plasticity and response to augmentation. Limited views of the contralateral common femoral vein are unremarkable. OTHER None. Limitations: none IMPRESSION: Negative. Electronically Signed   By: Dorise Bullion III M.D   On: 03/01/2020 14:10   DG Foot Complete Right  Result Date: 03/01/2020 CLINICAL DATA:  Pain with bruising medially EXAM: RIGHT FOOT COMPLETE - 3+ VIEW COMPARISON:  None. FINDINGS: Frontal, oblique, and lateral views were obtained. No evident fracture or dislocation. Joint spaces appear unremarkable. No erosive change. No radiopaque foreign body. IMPRESSION: No fracture or dislocation.  No evident arthropathy. Electronically Signed   By: Lowella Grip III M.D.   On: 03/01/2020 11:53    ____________________________________________    PROCEDURES  Procedure(s) performed:    Procedures    Medications  iohexol (OMNIPAQUE) 350 MG/ML injection 125 mL (125 mLs Intravenous Contrast Given 03/01/20 1236)  acetaminophen (TYLENOL) tablet 650 mg (650 mg Oral Given 03/01/20 1433)  ____________________________________________   INITIAL IMPRESSION / ASSESSMENT AND PLAN / ED COURSE  Pertinent labs & imaging results that were available during my care of the patient were reviewed by me and considered in my medical decision making (see chart for details).  Review of the [place] CSRS was performed in accordance of the [place] prior to dispensing any controlled drugs.   Patient presented to the emergency department for evaluation of ecchymosis to bilateral great toes for one week. Patient does have an area of ecchymosis to the bottom of right great toe that does look like a blood blister or bruised callus, but she also has one forming to the left toe as well.  Patient denies any trauma or anything to  form bilateral bruises to the bottom of her toes bilaterally so imaging was ordered to further evaluate.  CT angiogram and venous ultrasound were ordered to evaluate possible occlusion or DVT.  CTA negative for occlusion and ultrasound negative for DVT.  X-ray negative for acute bony abnormalities. ABI was calculated by myself and RN. Pressure of 174 and 168 were obtained in upper extremities and pressure of 142 was obtained in lower extremities bilaterally. ABI calculated at .82.  Dr. Trula Slade was updated on patient, her CT scan, ultrasound, x-rays and ABI.  He will follow up with her in outpatient clinic next week.  Patient is agreeable with this plan.  Patient is to follow up with vascular surgery as directed. Patient is given ED precautions to return to the ED for any worsening or new symptoms.   Regina Carpenter was evaluated in Emergency Department on 03/01/2020 for the symptoms described in the history of present illness. She was evaluated in the context of the global COVID-19 pandemic, which necessitated consideration that the patient might be at risk for infection with the SARS-CoV-2 virus that causes COVID-19. Institutional protocols and algorithms that pertain to the evaluation of patients at risk for COVID-19 are in a state of rapid change based on information released by regulatory bodies including the CDC and federal and state organizations. These policies and algorithms were followed during the patient's care in the ED.  ____________________________________________  FINAL CLINICAL IMPRESSION(S) / ED DIAGNOSES  Final diagnoses:  Blood blister  Abnormal ankle brachial index (ABI)  Bruise of toe      NEW MEDICATIONS STARTED DURING THIS VISIT:  ED Discharge Orders    None          This chart was dictated using voice recognition software/Dragon. Despite best efforts to proofread, errors can occur which can change the meaning. Any change was purely unintentional.    Laban Emperor, PA-C 03/01/20 1556    Blake Divine, MD 03/01/20 [phone]

## 2020-04-07 ENCOUNTER — Other Ambulatory Visit: Payer: Self-pay

## 2020-04-07 ENCOUNTER — Emergency Department
Admission: EM | Admit: 2020-04-07 | Discharge: 2020-04-07 | Disposition: A | Discharge status: Home / Self Care | Payer: Medicaid Other | Attending: Emergency Medicine | Admitting: Emergency Medicine

## 2020-04-07 ENCOUNTER — Encounter: Payer: Self-pay | Admitting: Emergency Medicine

## 2020-04-07 DIAGNOSIS — L97911 Non-pressure chronic ulcer of unspecified part of right lower leg limited to breakdown of skin: Secondary | ICD-10-CM

## 2020-04-07 DIAGNOSIS — F17210 Nicotine dependence, cigarettes, uncomplicated: Secondary | ICD-10-CM | POA: Insufficient documentation

## 2020-04-07 MED ORDER — OXYCODONE-ACETAMINOPHEN 5-325 MG PO TABS
1.0000 | ORAL_TABLET | Freq: Once | ORAL | Status: AC
  Administered 2020-04-07: 1 via ORAL
  Filled 2020-04-07: qty 1

## 2020-04-07 MED ORDER — OXYCODONE-ACETAMINOPHEN 5-325 MG PO TABS: 1.0000 | ORAL_TABLET | Freq: Four times a day (QID) | ORAL | 0 refills | Status: DC | PRN

## 2020-04-07 MED ORDER — CEPHALEXIN 500 MG PO CAPS: 500.0000 mg | ORAL_CAPSULE | Freq: Three times a day (TID) | ORAL | 0 refills | Status: DC

## 2020-04-07 NOTE — ED Notes (Signed)
See triage note, pt reports right leg with ulcers, states has polyarthritis condition to right leg.  States has been taking tylenol, but starts bleeding from toes and side of leg whenever she starts taking tylenol.  Wounds noted to right lower leg, 1st, 2nd, and 4th toes on right foot.  Drainage noted to right lower leg.  Denies fevers, chills.  Pt tearful in treatment room, noted to be uncomfortable.

## 2020-04-07 NOTE — ED Provider Notes (Signed)
[facility] Emergency Department Provider Note  ____________________________________________   First MD Initiated Contact with Patient 04/07/20 1625     (approximate)  I have reviewed the triage vital signs and the nursing notes.   HISTORY  Chief Complaint Skin Problem   HPI Regina Carpenter is a 44 y.o. female presents to the ED with complaint of ulcer on her right leg with increased pain.  Patient currently is being seen by Dr. 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  Patient denies any fever or chills.  There is been no injury or direct trauma to the ulcerative area.  Patient states that she will be seeing Dr. Jefm Bryant tomorrow.  Currently she rates her pain as 10/10.         Past Medical History:  Diagnosis Date  . Arthralgia of right knee   . Arthritis   . Cellulitis of left leg   . Collagen vascular disease ([place])   . Lymphadenopathy, mesenteric   . Right hand pain   . Tobacco abuse     Patient Active Problem List   Diagnosis Date Noted  . Hyponatremia 11/25/2016  . Hypokalemia 11/25/2016  . Leukocytosis 11/25/2016  . Generalized weakness 11/25/2016  . Generalized pain 11/25/2016  . Conjunctivitis 11/25/2016  . Scleritis and episcleritis of right eye   . Inflammatory polyarthritis ([place]) 11/23/2016  . Abdominal pain 11/14/2016  . Lymphadenopathy, mesenteric   . Arthralgia of right knee 04/02/2015  . Cellulitis of left leg 03/21/2015  . Right hand pain 03/21/2015  . Tobacco use 03/21/2015    Past Surgical History:  Procedure Laterality Date  . denies      Prior to Admission medications   Medication Sig Start Date End Date Taking? Authorizing Provider  cephALEXin (KEFLEX) 500 MG capsule Take 1 capsule (500 mg total) by mouth 3 (three) times daily. 04/07/20   Johnn Hai, PA-C  folic acid (FOLVITE) 1 MG tablet Take 1 mg by mouth daily.    [provider]  meloxicam  (MOBIC) 15 MG tablet Take 1 tablet (15 mg total) by mouth daily. 12/16/19 12/15/20  Johnn Hai, PA-C  methotrexate (RHEUMATREX) 2.5 MG tablet Take 2.5 mg by mouth once a week. Caution:Chemotherapy. Protect from light.    [provider]  oxyCODONE-acetaminophen (PERCOCET) 5-325 MG tablet Take 1 tablet by mouth every 6 (six) hours as needed for severe pain. 04/07/20   Johnn Hai, PA-C  predniSONE (DELTASONE) 5 MG tablet Take 5 mg by mouth daily with breakfast.    [provider]    Allergies Ibuprofen and Sulfasalazine  Family History  Problem Relation Age of Onset  . Leukemia Maternal Grandfather   . Alcohol abuse Father     Social History Social History   Tobacco Use  . Smoking status: Current Every Day Smoker    Packs/day: 0.50    Types: Cigarettes  . Smokeless tobacco: Never Used  Vaping Use  . Vaping Use: Never used  Substance Use Topics  . Alcohol use: No  . Drug use: No    Review of Systems Constitutional: No fever/chills Eyes: No visual changes. Cardiovascular: Denies chest pain. Respiratory: Denies shortness of breath. Gastrointestinal: No abdominal pain.  No nausea, no vomiting.   Musculoskeletal: Negative for back pain. Skin: Ulcerative lesion right leg.  Sores to multiple toes. Neurological: Negative for headaches, focal weakness or numbness. ____________________________________________   PHYSICAL EXAM:  VITAL SIGNS: ED Triage Vitals  Enc Vitals Group  BP 04/07/20 1553 (!) 155/91     Pulse Rate 04/07/20 1553 (!) 108     Resp 04/07/20 1553 18     Temp 04/07/20 1553 99.1 F (37.3 C)     Temp Source 04/07/20 1553 Oral     SpO2 04/07/20 1553 96 %     Weight 04/07/20 1548 185 lb (83.9 kg)     Height 04/07/20 1548 5\' 5"  (1.651 m)     Head Circumference --      Peak Flow --      Pain Score 04/07/20 1547 10     Pain Loc --      Pain Edu? --      Excl. in [place]? --     Constitutional: Alert and oriented. Well appearing and  in no acute distress. Eyes: Conjunctivae are normal. PERRL. EOMI. Head: Atraumatic. Neck: No stridor.   Cardiovascular: Normal rate, regular rhythm. Grossly normal heart sounds.  Good peripheral circulation. Respiratory: Normal respiratory effort.  No retractions. Lungs CTAB. Musculoskeletal: No gross deformity is noted of the lower right extremity.  Patient does on the lateral aspect of her right lower leg have a chronic ulcerative lesion without active drainage and no erythema or evidence of cellulitis.  There are multiple sores to multiple toes without drainage. Neurologic:  Normal speech and language. No gross focal neurologic deficits are appreciated.  Skin:  Skin is warm, dry.  Right leg exam as above. Psychiatric: Mood and affect are normal. Speech and behavior are normal.  ____________________________________________   LABS (all labs ordered are listed, but only abnormal results are displayed)  Labs Reviewed - No data to display   PROCEDURES  Procedure(s) performed (including Critical Care):  Procedures  Ulcerative area on the right leg was cleaned with normal saline by provider.  Area was dressed with a wound dressing. ____________________________________________   INITIAL IMPRESSION / ASSESSMENT AND PLAN / ED COURSE  As part of my medical decision making, I reviewed the following data within the electronic MEDICAL RECORD NUMBER Notes from prior ED visits and [place] Controlled Substance Database  44 year old female presents to the ED with complaint of ulcerative lesion to her right lower extremity that has increased in pain.  She denies any fever or chills.  There is no surrounding cellulitis or drainage from the area.  Patient was given Percocet while in the ED to help control her pain which helped greatly.  Patient was able to tolerate area being cleaned with normal saline and a dressing applied.  Patient is aware that prescriptions were sent to her pharmacy 1 for Percocet and the  other for Keflex for infection prevention.  She is encouraged to keep her appointment with Dr. Jefm Bryant tomorrow.  ____________________________________________   FINAL CLINICAL IMPRESSION(S) / ED DIAGNOSES  Final diagnoses:  Ulcer of right leg, limited to breakdown of skin [facility])     ED Discharge Orders         Ordered    oxyCODONE-acetaminophen (PERCOCET) 5-325 MG tablet  Every 6 hours PRN     Discontinue  Reprint     04/07/20 1802    cephALEXin (KEFLEX) 500 MG capsule  3 times daily     Discontinue  Reprint     04/07/20 1802           Note:  This document was prepared using Dragon voice recognition software and may include unintentional dictation errors.    Johnn Hai, PA-C 04/07/20 1814    Harvest Dark, MD 04/07/20 2005

## 2020-04-07 NOTE — Discharge Instructions (Signed)
Keep your appointment with Dr. Jefm Bryant tomorrow.  You may elevate your leg if needed to see if this helps with reduction of pain.  Keep the dressing clean and dry.  Begin taking Keflex 500 mg 3 times daily for infection until completely gone.  The Percocet is every 6 hours if needed for severe pain.

## 2020-04-07 NOTE — ED Triage Notes (Addendum)
Patient presents to the ED with, "ulcers" to her toe and leg.  Patient states she has had severe pain for the past several days to these areas.  Patient is walking with a slight limp.  Patient states, "I've been seeing Dr. 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

## 2020-05-09 DIAGNOSIS — I776 Arteritis, unspecified: Secondary | ICD-10-CM | POA: Insufficient documentation

## 2020-05-16 ENCOUNTER — Encounter: Payer: Medicaid Other | Admitting: Physician Assistant

## 2020-05-16 ENCOUNTER — Inpatient Hospital Stay
Admission: EM | Admit: 2020-05-16 | Discharge: 2020-05-24 | DRG: 854 | Disposition: A | Discharge status: Home Health | Payer: Medicaid Other | Attending: Internal Medicine | Admitting: Internal Medicine

## 2020-05-16 ENCOUNTER — Encounter: Payer: Self-pay | Admitting: Emergency Medicine

## 2020-05-16 ENCOUNTER — Emergency Department: Payer: Medicaid Other

## 2020-05-16 ENCOUNTER — Other Ambulatory Visit: Payer: Self-pay

## 2020-05-16 DIAGNOSIS — L97919 Non-pressure chronic ulcer of unspecified part of right lower leg with unspecified severity: Secondary | ICD-10-CM | POA: Diagnosis present

## 2020-05-16 DIAGNOSIS — F17200 Nicotine dependence, unspecified, uncomplicated: Secondary | ICD-10-CM

## 2020-05-16 DIAGNOSIS — L0390 Cellulitis, unspecified: Secondary | ICD-10-CM

## 2020-05-16 DIAGNOSIS — Y92239 Unspecified place in hospital as the place of occurrence of the external cause: Secondary | ICD-10-CM | POA: Diagnosis not present

## 2020-05-16 DIAGNOSIS — L03115 Cellulitis of right lower limb: Secondary | ICD-10-CM

## 2020-05-16 DIAGNOSIS — T380X5A Adverse effect of glucocorticoids and synthetic analogues, initial encounter: Secondary | ICD-10-CM | POA: Diagnosis not present

## 2020-05-16 DIAGNOSIS — D72829 Elevated white blood cell count, unspecified: Secondary | ICD-10-CM | POA: Diagnosis not present

## 2020-05-16 DIAGNOSIS — M86171 Other acute osteomyelitis, right ankle and foot: Secondary | ICD-10-CM | POA: Diagnosis present

## 2020-05-16 DIAGNOSIS — L97519 Non-pressure chronic ulcer of other part of right foot with unspecified severity: Secondary | ICD-10-CM | POA: Diagnosis present

## 2020-05-16 DIAGNOSIS — Z6832 Body mass index (BMI) 32.0-32.9, adult: Secondary | ICD-10-CM

## 2020-05-16 DIAGNOSIS — E872 Acidosis: Secondary | ICD-10-CM | POA: Diagnosis present

## 2020-05-16 DIAGNOSIS — D649 Anemia, unspecified: Secondary | ICD-10-CM | POA: Diagnosis present

## 2020-05-16 DIAGNOSIS — I96 Gangrene, not elsewhere classified: Secondary | ICD-10-CM | POA: Diagnosis present

## 2020-05-16 DIAGNOSIS — L97818 Non-pressure chronic ulcer of other part of right lower leg with other specified severity: Secondary | ICD-10-CM | POA: Insufficient documentation

## 2020-05-16 DIAGNOSIS — Z888 Allergy status to other drugs, medicaments and biological substances status: Secondary | ICD-10-CM | POA: Diagnosis not present

## 2020-05-16 DIAGNOSIS — Z716 Tobacco abuse counseling: Secondary | ICD-10-CM

## 2020-05-16 DIAGNOSIS — L97929 Non-pressure chronic ulcer of unspecified part of left lower leg with unspecified severity: Secondary | ICD-10-CM | POA: Diagnosis present

## 2020-05-16 DIAGNOSIS — E669 Obesity, unspecified: Secondary | ICD-10-CM | POA: Diagnosis present

## 2020-05-16 DIAGNOSIS — Z811 Family history of alcohol abuse and dependence: Secondary | ICD-10-CM

## 2020-05-16 DIAGNOSIS — A419 Sepsis, unspecified organism: Secondary | ICD-10-CM | POA: Diagnosis present

## 2020-05-16 DIAGNOSIS — Z7952 Long term (current) use of systemic steroids: Secondary | ICD-10-CM | POA: Diagnosis not present

## 2020-05-16 DIAGNOSIS — D849 Immunodeficiency, unspecified: Secondary | ICD-10-CM | POA: Diagnosis present

## 2020-05-16 DIAGNOSIS — Z8249 Family history of ischemic heart disease and other diseases of the circulatory system: Secondary | ICD-10-CM | POA: Insufficient documentation

## 2020-05-16 DIAGNOSIS — M318 Other specified necrotizing vasculopathies: Secondary | ICD-10-CM | POA: Diagnosis present

## 2020-05-16 DIAGNOSIS — Z806 Family history of leukemia: Secondary | ICD-10-CM | POA: Diagnosis not present

## 2020-05-16 DIAGNOSIS — Z791 Long term (current) use of non-steroidal anti-inflammatories (NSAID): Secondary | ICD-10-CM | POA: Diagnosis not present

## 2020-05-16 DIAGNOSIS — Z79899 Other long term (current) drug therapy: Secondary | ICD-10-CM

## 2020-05-16 DIAGNOSIS — I776 Arteritis, unspecified: Secondary | ICD-10-CM

## 2020-05-16 DIAGNOSIS — F17210 Nicotine dependence, cigarettes, uncomplicated: Secondary | ICD-10-CM | POA: Diagnosis present

## 2020-05-16 DIAGNOSIS — G8929 Other chronic pain: Secondary | ICD-10-CM | POA: Diagnosis present

## 2020-05-16 DIAGNOSIS — L97828 Non-pressure chronic ulcer of other part of left lower leg with other specified severity: Secondary | ICD-10-CM | POA: Insufficient documentation

## 2020-05-16 DIAGNOSIS — I10 Essential (primary) hypertension: Secondary | ICD-10-CM | POA: Diagnosis present

## 2020-05-16 DIAGNOSIS — M069 Rheumatoid arthritis, unspecified: Secondary | ICD-10-CM | POA: Insufficient documentation

## 2020-05-16 DIAGNOSIS — Z882 Allergy status to sulfonamides status: Secondary | ICD-10-CM | POA: Diagnosis not present

## 2020-05-16 DIAGNOSIS — S98911D Complete traumatic amputation of right foot, level unspecified, subsequent encounter: Secondary | ICD-10-CM

## 2020-05-16 DIAGNOSIS — IMO0001 Reserved for inherently not codable concepts without codable children: Secondary | ICD-10-CM

## 2020-05-16 DIAGNOSIS — Z20822 Contact with and (suspected) exposure to covid-19: Secondary | ICD-10-CM | POA: Diagnosis present

## 2020-05-16 DIAGNOSIS — I252 Old myocardial infarction: Secondary | ICD-10-CM | POA: Insufficient documentation

## 2020-05-16 DIAGNOSIS — M351 Other overlap syndromes: Secondary | ICD-10-CM | POA: Diagnosis present

## 2020-05-16 DIAGNOSIS — L97529 Non-pressure chronic ulcer of other part of left foot with unspecified severity: Secondary | ICD-10-CM

## 2020-05-16 LAB — HIV ANTIBODY (ROUTINE TESTING W REFLEX): HIV Screen 4th Generation wRfx: NONREACTIVE

## 2020-05-16 LAB — COMPREHENSIVE METABOLIC PANEL
ALT: 18 U/L (ref 0–44)
AST: 17 U/L (ref 15–41)
Albumin: 3.6 g/dL (ref 3.5–5.0)
Alkaline Phosphatase: 64 U/L (ref 38–126)
Anion gap: 10 (ref 5–15)
BUN: 9 mg/dL (ref 6–20)
CO2: 25 mmol/L (ref 22–32)
Calcium: 9.6 mg/dL (ref 8.9–10.3)
Chloride: 100 mmol/L (ref 98–111)
Creatinine, Ser: 0.73 mg/dL (ref 0.44–1.00)
GFR calc Af Amer: >60 mL/min (ref >60)
GFR calc non Af Amer: >60 mL/min (ref >60)
Glucose, Bld: 177 mg/dL — ABNORMAL HIGH (ref 70–99)
Potassium: 4.2 mmol/L (ref 3.5–5.1)
Sodium: 135 mmol/L (ref 135–145)
Total Bilirubin: 0.6 mg/dL (ref 0.3–1.2)
Total Protein: 7.8 g/dL (ref 6.5–8.1)

## 2020-05-16 LAB — CBC WITH DIFFERENTIAL/PLATELET
Abs Immature Granulocytes: 0.17 10*3/uL — ABNORMAL HIGH (ref 0.00–0.07)
Basophils Absolute: 0 10*3/uL (ref 0.0–0.1)
Basophils Relative: 0 %
Eosinophils Absolute: 0 10*3/uL (ref 0.0–0.5)
Eosinophils Relative: 0 %
HCT: 37.3 % (ref 36.0–46.0)
Hemoglobin: 11.7 g/dL — ABNORMAL LOW (ref 12.0–15.0)
Immature Granulocytes: 1 %
Lymphocytes Relative: 10 %
Lymphs Abs: 1.9 10*3/uL (ref 0.7–4.0)
MCH: 24.8 pg — ABNORMAL LOW (ref 26.0–34.0)
MCHC: 31.4 g/dL (ref 30.0–36.0)
MCV: 79 fL — ABNORMAL LOW (ref 80.0–100.0)
Monocytes Absolute: 0.5 10*3/uL (ref 0.1–1.0)
Monocytes Relative: 2 %
Neutro Abs: 17.2 10*3/uL — ABNORMAL HIGH (ref 1.7–7.7)
Neutrophils Relative %: 87 %
Platelets: 403 10*3/uL — ABNORMAL HIGH (ref 150–400)
RBC: 4.72 MIL/uL (ref 3.87–5.11)
RDW: 19.2 % — ABNORMAL HIGH (ref 11.5–15.5)
WBC: 19.8 10*3/uL — ABNORMAL HIGH (ref 4.0–10.5)
nRBC: 0 % (ref 0.0–0.2)

## 2020-05-16 LAB — LACTIC ACID, PLASMA
Lactic Acid, Venous: 2.2 mmol/L (ref 0.5–1.9)
Lactic Acid, Venous: 1.2 mmol/L (ref 0.5–1.9)

## 2020-05-16 LAB — SARS CORONAVIRUS 2 BY RT PCR (HOSPITAL ORDER, PERFORMED IN ~~LOC~~ HOSPITAL LAB): SARS Coronavirus 2: NEGATIVE

## 2020-05-16 LAB — SEDIMENTATION RATE: Sed Rate: 36 mm/hr — ABNORMAL HIGH (ref 0–20)

## 2020-05-16 LAB — C-REACTIVE PROTEIN: CRP: 3.8 mg/dL — ABNORMAL HIGH (ref <1.0)

## 2020-05-16 MED ORDER — LACTATED RINGERS IV SOLN: INTRAVENOUS | Status: AC

## 2020-05-16 MED ORDER — ONDANSETRON HCL 4 MG PO TABS: 4.0000 mg | ORAL_TABLET | Freq: Four times a day (QID) | ORAL | Status: DC | PRN

## 2020-05-16 MED ORDER — OXYCODONE HCL 5 MG PO TABS
5.0000 mg | ORAL_TABLET | ORAL | Status: DC | PRN
  Administered 2020-05-16 – 2020-05-20 (×13): 5 mg via ORAL
  Filled 2020-05-16 (×14): qty 1

## 2020-05-16 MED ORDER — VANCOMYCIN HCL 1500 MG/300ML IV SOLN
1500.0000 mg | Freq: Once | INTRAVENOUS | Status: AC
  Administered 2020-05-16: 1500 mg via INTRAVENOUS
  Filled 2020-05-16: qty 300

## 2020-05-16 MED ORDER — LACTATED RINGERS IV BOLUS (SEPSIS): 1000.0000 mL | Freq: Once | INTRAVENOUS | Status: DC

## 2020-05-16 MED ORDER — SODIUM CHLORIDE 0.9 % IV BOLUS
1000.0000 mL | Freq: Once | INTRAVENOUS | Status: AC
  Administered 2020-05-16: 1000 mL via INTRAVENOUS

## 2020-05-16 MED ORDER — ACETAMINOPHEN 650 MG RE SUPP: 650.0000 mg | Freq: Four times a day (QID) | RECTAL | Status: DC | PRN

## 2020-05-16 MED ORDER — CYCLOPHOSPHAMIDE 25 MG PO CAPS
50.0000 mg | ORAL_CAPSULE | Freq: Every day | ORAL | Status: DC
  Administered 2020-05-21 – 2020-05-24 (×4): 50 mg via ORAL
  Filled 2020-05-16 (×2): qty 2
  Filled 2020-05-16 (×3): qty 1

## 2020-05-16 MED ORDER — SODIUM CHLORIDE 0.9 % IV SOLN
2.0000 g | INTRAVENOUS | Status: DC
  Administered 2020-05-16 – 2020-05-20 (×5): 2 g via INTRAVENOUS
  Filled 2020-05-16: qty 20
  Filled 2020-05-16 (×5): qty 2
  Filled 2020-05-16: qty 20

## 2020-05-16 MED ORDER — TRAZODONE HCL 50 MG PO TABS
25.0000 mg | ORAL_TABLET | Freq: Every evening | ORAL | Status: DC | PRN
  Administered 2020-05-18 – 2020-05-21 (×4): 25 mg via ORAL
  Filled 2020-05-16 (×5): qty 1

## 2020-05-16 MED ORDER — AMLODIPINE BESYLATE 5 MG PO TABS
5.0000 mg | ORAL_TABLET | Freq: Every day | ORAL | Status: DC
  Administered 2020-05-16 – 2020-05-24 (×9): 5 mg via ORAL
  Filled 2020-05-16 (×10): qty 1

## 2020-05-16 MED ORDER — PIPERACILLIN-TAZOBACTAM 3.375 G IVPB
3.3750 g | Freq: Once | INTRAVENOUS | Status: AC
  Administered 2020-05-16: 3.375 g via INTRAVENOUS
  Filled 2020-05-16: qty 50

## 2020-05-16 MED ORDER — METRONIDAZOLE IN NACL 5-0.79 MG/ML-% IV SOLN
500.0000 mg | Freq: Three times a day (TID) | INTRAVENOUS | Status: DC
  Administered 2020-05-17 – 2020-05-21 (×16): 500 mg via INTRAVENOUS
  Filled 2020-05-16 (×16): qty 100

## 2020-05-16 MED ORDER — ACETAMINOPHEN 325 MG PO TABS
650.0000 mg | ORAL_TABLET | Freq: Four times a day (QID) | ORAL | Status: DC | PRN
  Administered 2020-05-18 – 2020-05-21 (×3): 650 mg via ORAL
  Filled 2020-05-16 (×3): qty 2

## 2020-05-16 MED ORDER — PREDNISONE 10 MG PO TABS
5.0000 mg | ORAL_TABLET | Freq: Every day | ORAL | Status: DC
  Administered 2020-05-17 – 2020-05-19 (×3): 5 mg via ORAL
  Filled 2020-05-16 (×3): qty 1

## 2020-05-16 MED ORDER — METHOTREXATE 2.5 MG PO TABS: 2.5000 mg | ORAL_TABLET | ORAL | Status: DC

## 2020-05-16 MED ORDER — ENOXAPARIN SODIUM 40 MG/0.4ML ~~LOC~~ SOLN
40.0000 mg | SUBCUTANEOUS | Status: DC
  Administered 2020-05-16 – 2020-05-23 (×8): 40 mg via SUBCUTANEOUS
  Filled 2020-05-16 (×8): qty 0.4

## 2020-05-16 MED ORDER — VANCOMYCIN HCL IN DEXTROSE 1-5 GM/200ML-% IV SOLN: 1000.0000 mg | Freq: Once | INTRAVENOUS | Status: DC

## 2020-05-16 MED ORDER — ALBUTEROL SULFATE (2.5 MG/3ML) 0.083% IN NEBU: 2.5000 mg | INHALATION_SOLUTION | RESPIRATORY_TRACT | Status: DC | PRN

## 2020-05-16 MED ORDER — SODIUM CHLORIDE 0.9 % IV SOLN: INTRAVENOUS | Status: DC

## 2020-05-16 MED ORDER — MORPHINE SULFATE (PF) 2 MG/ML IV SOLN
2.0000 mg | INTRAVENOUS | Status: DC | PRN
  Administered 2020-05-16 – 2020-05-17 (×3): 2 mg via INTRAVENOUS
  Filled 2020-05-16 (×3): qty 1

## 2020-05-16 MED ORDER — ONDANSETRON HCL 4 MG/2ML IJ SOLN
4.0000 mg | Freq: Once | INTRAMUSCULAR | Status: AC
  Administered 2020-05-16: 4 mg via INTRAVENOUS
  Filled 2020-05-16: qty 2

## 2020-05-16 MED ORDER — FOLIC ACID 1 MG PO TABS
1.0000 mg | ORAL_TABLET | Freq: Every day | ORAL | Status: DC
  Administered 2020-05-17 – 2020-05-24 (×8): 1 mg via ORAL
  Filled 2020-05-16 (×9): qty 1

## 2020-05-16 MED ORDER — ONDANSETRON HCL 4 MG/2ML IJ SOLN: 4.0000 mg | Freq: Four times a day (QID) | INTRAMUSCULAR | Status: DC | PRN

## 2020-05-16 MED ORDER — HYDROMORPHONE HCL 1 MG/ML IJ SOLN
1.0000 mg | Freq: Once | INTRAMUSCULAR | Status: AC
  Administered 2020-05-16: 1 mg via INTRAVENOUS
  Filled 2020-05-16: qty 1

## 2020-05-16 MED ORDER — LACTATED RINGERS IV BOLUS (SEPSIS)
1000.0000 mL | Freq: Once | INTRAVENOUS | Status: AC
  Administered 2020-05-16: 1000 mL via INTRAVENOUS

## 2020-05-16 MED ORDER — SENNOSIDES-DOCUSATE SODIUM 8.6-50 MG PO TABS
1.0000 | ORAL_TABLET | Freq: Every evening | ORAL | Status: DC | PRN
  Administered 2020-05-21: 1 via ORAL
  Filled 2020-05-16: qty 1

## 2020-05-16 NOTE — ED Notes (Signed)
Transport to floor room 148.AS

## 2020-05-16 NOTE — Consult Note (Signed)
Pharmacy Antibiotic Note  Regina Carpenter is a 44 y.o. female admitted on 05/16/2020 with cellulitis.  Pharmacy has been consulted for Vancomycin dosing.  Plan: Pt received Vancomycin 1500mg  IV x 1 and Zosyn 3.375g IV x 1 in ED   Will follow with: Vancomycin 1000mg  IV every 12 hours.  Goal trough 10-15 mcg/mL.  Height: 5\' 5"  (165.1 cm) Weight: 83 kg (183 lb) IBW/kg (Calculated) : 57  Temp (24hrs), Avg:98.8 F (37.1 C), Min:98.6 F (37 C), Max:98.9 F (37.2 C)  Recent Labs  Lab 05/16/20 1141 05/16/20 1544  WBC 19.8*  --   CREATININE 0.73  --   LATICACIDVEN 2.2* 1.2    Estimated Creatinine Clearance: 95.5 mL/min (by C-G formula based on SCr of 0.73 mg/dL).    Allergies  Allergen Reactions  . Ibuprofen Other (See Comments)    Pt states that she sweats profusely after taking ibuprofen Other reaction(s): Other (See Comments) "sweating:"  . Sulfasalazine Rash    Antimicrobials this admission: 8/13 Zosyn x 1  Vancomycin 8/13 >>  Dose adjustments this admission: None  Microbiology results: 8/13 BCx: pending  COVID pending  Thank you for allowing pharmacy to be a part of this patient's care.  Lu Duffel, PharmD, BCPS Clinical Pharmacist 05/16/2020 7:01 PM

## 2020-05-16 NOTE — Consult Note (Signed)
PHARMACY -  BRIEF ANTIBIOTIC NOTE   Pharmacy has received consult(s) for Vancomycin/Zosyn from an ED provider.  The patient's profile has been reviewed for ht/wt/allergies/indication/available labs.    One time order(s) placed for Vancomycin 1500mg  IV x 1 and Zosyn 3.375g IV x 1  Further antibiotics/pharmacy consults should be ordered by admitting physician if indicated.                       Thank you,  Lu Duffel, PharmD, BCPS Clinical Pharmacist 05/16/2020 4:10 PM

## 2020-05-16 NOTE — Progress Notes (Signed)
[facility]  Consult received. Most likely will need bilateral lower extremity angiogram next week with Dr. Lucky Cowboy. Agree with admission and IV ABX.  Full consult to follow.  Discussed with Dr. Ellis Parents Dut[place]ss Crosland PA-C 05/16/2020 4:56 PM

## 2020-05-16 NOTE — ED Provider Notes (Addendum)
[facility] Emergency Department Provider Note  ____________________________________________   First MD Initiated Contact with Patient 05/16/20 1532     (approximate)  I have reviewed the triage vital signs and the nursing notes.   HISTORY  Chief Complaint Wound Infection    HPI Regina Carpenter is a 44 y.o. female  Here with b/l foot pain. Pt has a known h/o RA, polyarteritis w/ vasculitis, and is here with bilateral foot pain. Pt states that over the past several months, she has had what initially were painful ulcers on multiple toes that have now progressed to severe gangrene. She has developed increasingly painful sores on her b/l toes along with more acute worsening pain,r edness, and swelling of the right foot. She has had some foul smelling drainage from the area as well. She was set up with an appt at the wound center today and was sent here for further treatment. No known fevers, but has had some chills. 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       Past Medical History:  Diagnosis Date   Arthralgia of right knee    Arthritis    Cellulitis of left leg    Collagen vascular disease ([place])    Lymphadenopathy, mesenteric    Right hand pain    Tobacco abuse     Patient Active Problem List   Diagnosis Date Noted   Hyponatremia 11/25/2016   Hypokalemia 11/25/2016   Leukocytosis 11/25/2016   Generalized weakness 11/25/2016   Generalized pain 11/25/2016   Conjunctivitis 11/25/2016   Scleritis and episcleritis of right eye    Inflammatory polyarthritis ([place]) 11/23/2016   Abdominal pain 11/14/2016   Lymphadenopathy, mesenteric    Arthralgia of right knee 04/02/2015   Cellulitis of left leg 03/21/2015   Right hand pain 03/21/2015   Tobacco use 03/21/2015    Past Surgical History:  Procedure Laterality Date   denies      Prior to Admission medications   Medication Sig Start Date End Date Taking? Authorizing  Provider  cephALEXin (KEFLEX) 500 MG capsule Take 1 capsule (500 mg total) by mouth 3 (three) times daily. 04/07/20   Johnn Hai, PA-C  folic acid (FOLVITE) 1 MG tablet Take 1 mg by mouth daily.    [provider]  meloxicam (MOBIC) 15 MG tablet Take 1 tablet (15 mg total) by mouth daily. 12/16/19 12/15/20  Johnn Hai, PA-C  methotrexate (RHEUMATREX) 2.5 MG tablet Take 2.5 mg by mouth once a week. Caution:Chemotherapy. Protect from light.    [provider]  oxyCODONE-acetaminophen (PERCOCET) 5-325 MG tablet Take 1 tablet by mouth every 6 (six) hours as needed for severe pain. 04/07/20   Johnn Hai, PA-C  predniSONE (DELTASONE) 5 MG tablet Take 5 mg by mouth daily with breakfast.    [provider]    Allergies Ibuprofen and Sulfasalazine  Family History  Problem Relation Age of Onset   Leukemia Maternal Grandfather    Alcohol abuse Father     Social History Social History   Tobacco Use   Smoking status: Current Every Day Smoker    Packs/day: 0.50    Types: Cigarettes   Smokeless tobacco: Never Used  Vaping Use   Vaping Use: Never used  Substance Use Topics   Alcohol use: No   Drug use: No    Review of Systems  Review of Systems  Constitutional: Positive for fatigue. Negative for chills and fever.  HENT: Negative for sore throat.  Respiratory: Negative for shortness of breath.   Cardiovascular: Negative for chest pain.  Gastrointestinal: Positive for nausea. Negative for abdominal pain.  Genitourinary: Negative for flank pain.  Musculoskeletal: Negative for neck pain.  Skin: Positive for rash and wound.  Allergic/Immunologic: Negative for immunocompromised state.  Neurological: Negative for weakness and numbness.  Hematological: Does not bruise/bleed easily.  All other systems reviewed and are negative.    ____________________________________________  PHYSICAL EXAM:      VITAL SIGNS: ED Triage Vitals  Enc Vitals  Group     BP 05/16/20 1138 (!) 134/95     Pulse Rate 05/16/20 1138 (!) 112     Resp 05/16/20 1138 20     Temp 05/16/20 1138 98.9 F (37.2 C)     Temp Source 05/16/20 1138 Oral     SpO2 05/16/20 1138 100 %     Weight 05/16/20 1139 183 lb (83 kg)     Height 05/16/20 1139 5\' 5"  (1.651 m)     Head Circumference --      Peak Flow --      Pain Score 05/16/20 1139 9     Pain Loc --      Pain Edu? --      Excl. in [place]? --      Physical Exam Vitals and nursing note reviewed.  Constitutional:      General: She is not in acute distress.    Appearance: She is well-developed.  HENT:     Head: Normocephalic and atraumatic.  Eyes:     Conjunctiva/sclera: Conjunctivae normal.  Cardiovascular:     Rate and Rhythm: Normal rate and regular rhythm.     Heart sounds: Normal heart sounds.  Pulmonary:     Effort: Pulmonary effort is normal. No respiratory distress.     Breath sounds: No wheezing.  Abdominal:     General: There is no distension.  Musculoskeletal:     Cervical back: Neck supple.  Skin:    General: Skin is warm.     Capillary Refill: Capillary refill takes less than 2 seconds.     Findings: Rash present.  Neurological:     Mental Status: She is alert and oriented to person, place, and time.     Motor: No abnormal muscle tone.      LOWER EXTREMITY EXAM: BILATERAL  INSPECTION & PALPATION: See images below. Multiple gangrenous, ulcerated ares b/l feet, with marked swelling, redness, and foul odor from R distal foot.  SENSORY: sensation is intact to light touch in:  Superficial peroneal nerve distribution (over dorsum of foot) Deep peroneal nerve distribution (over first dorsal web space) Sural nerve distribution (over lateral aspect 5th metatarsal) Saphenous nerve distribution (over medial instep)  MOTOR:  + Motor EHL (great toe dorsiflexion) + FHL (great toe plantar flexion)  + TA (ankle dorsiflexion)  + GSC (ankle plantar flexion)  VASCULAR: 1+ DP, unable to  palpate PT  COMPARTMENTS: Soft, warm, well-perfused No pain with passive extension No parethesias        ____________________________________________   LABS (all labs ordered are listed, but only abnormal results are displayed)  Labs Reviewed  CBC WITH DIFFERENTIAL/PLATELET - Abnormal; Notable for the following components:      Result Value   WBC 19.8 (*)    Hemoglobin 11.7 (*)    MCV 79.0 (*)    MCH 24.8 (*)    RDW 19.2 (*)    Platelets 403 (*)    Neutro Abs 17.2 (*)  Abs Immature Granulocytes 0.17 (*)    All other components within normal limits  COMPREHENSIVE METABOLIC PANEL - Abnormal; Notable for the following components:   Glucose, Bld 177 (*)    All other components within normal limits  LACTIC ACID, PLASMA - Abnormal; Notable for the following components:   Lactic Acid, Venous 2.2 (*)    All other components within normal limits  CULTURE, BLOOD (ROUTINE X 2)  CULTURE, BLOOD (ROUTINE X 2)  SARS CORONAVIRUS 2 BY RT PCR (HOSPITAL ORDER, [facility])  CULTURE, BLOOD (SINGLE)  LACTIC ACID, PLASMA  C-REACTIVE PROTEIN  SEDIMENTATION RATE    ____________________________________________  EKG: None ________________________________________  RADIOLOGY All imaging, including plain films, CT scans, and ultrasounds, independently reviewed by me, and interpretations confirmed via formal radiology reads.  ED MD interpretation:   XR Foot Right: No osteo, lucency over 2nd and fourth toes likely bandage DG Toe Left: No osteo  Official radiology report(s): DG Foot 2 Views Right  Result Date: 05/16/2020 CLINICAL DATA:  Necrosis of the second and fourth digit on the RIGHT foot. Necrosis of the fifth digit on the LEFT foot. EXAM: RIGHT FOOT - 2 VIEW COMPARISON:  None. FINDINGS: No acute fracture or dislocation. Joint spaces and alignment are maintained. No area of erosion or osseous destruction on these limited views. No unexpected radiopaque  foreign body. There is lucency projecting over the second and fourth toes. This may reflect subcutaneous air versus air trapped underneath overlying bandage. Soft tissue edema. IMPRESSION: 1. No definitive radiographic evidence of osteomyelitis. 2. Lucency projecting over the second and fourth toes may reflect subcutaneous air and/or air trapped underneath overlying bandage. Recommend correlation with physical exam. Electronically Signed   By: Valentino Saxon MD   On: 05/16/2020 13:09   DG Toe 5th Left  Result Date: 05/16/2020 CLINICAL DATA:  Necrosis of the fifth toe EXAM: DG TOE 5TH LEFT COMPARISON:  None. FINDINGS: Evaluation for fine osseous detail is limited secondary to overlying bandage. No acute fracture or dislocation. Joint spaces and alignment are maintained. No area of erosion or osseous destruction. No unexpected radiopaque foreign body. Soft tissues are unremarkable. IMPRESSION: No definitive radiographic evidence of osteomyelitis. Electronically Signed   By: Valentino Saxon MD   On: 05/16/2020 13:10    ____________________________________________  PROCEDURES   Procedure(s) performed (including Critical Care):  .Critical Care Performed by: Duffy Bruce, MD Authorized by: Duffy Bruce, MD   Critical care provider statement:    Critical care time (minutes):  35   Critical care time was exclusive of:  Separately billable procedures and treating other patients and teaching time   Critical care was necessary to treat or prevent imminent or life-threatening deterioration of the following conditions:  Cardiac failure, circulatory failure and sepsis   Critical care was time spent personally by me on the following activities:  Development of treatment plan with patient or surrogate, discussions with consultants, evaluation of patient's response to treatment, examination of patient, obtaining history from patient or surrogate, ordering and performing treatments and  interventions, ordering and review of laboratory studies, ordering and review of radiographic studies, pulse oximetry, re-evaluation of patient's condition and review of old charts   I assumed direction of critical care for this patient from another provider in my specialty: no      ____________________________________________  INITIAL IMPRESSION / MDM / [place] / ED COURSE  As part of my medical decision making, I reviewed the following data within the electronic  MEDICAL RECORD NUMBER Nursing notes reviewed and incorporated, Old chart reviewed, Notes from prior ED visits, and [place] Controlled Substance Database       *Regina Carpenter was evaluated in Emergency Department on 05/16/2020 for the symptoms described in the history of present illness. She was evaluated in the context of the global COVID-19 pandemic, which necessitated consideration that the patient might be at risk for infection with the SARS-CoV-2 virus that causes COVID-19. Institutional protocols and algorithms that pertain to the evaluation of patients at risk for COVID-19 are in a state of rapid change based on information released by regulatory bodies including the CDC and federal and state organizations. These policies and algorithms were followed during the patient's care in the ED.  Some ED evaluations and interventions may be delayed as a result of limited staffing during the pandemic.*     Medical Decision Making:  44 yo F here with sepsis 2/2 gangrenous foot wounds bilaterally, R>L. Pt likely has an acute superimposed cellulitis on R. Labs reviewed, remarkable for significant leukocytosis and mild lactic acidosis, cf sepsis. IVF, ABX started. CMP with normal renal function. Discussed with Dr. Vickki Muff of Podiatry and Dr. Delana Meyer of Vascular. Will admit to medicine. Analgesia, fluids given. Will discuss possible need for stress-dose steroids with medicine.  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[place])  Cellulitis of right foot  Sepsis due to cellulitis ([place])     MEDICATIONS GIVEN DURING THIS VISIT:  Medications  vancomycin (VANCOREADY) IVPB 1500 mg/300 mL (has no administration in time range)  piperacillin-tazobactam (ZOSYN) IVPB 3.375 g (3.375 g Intravenous New Bag/Given 05/16/20 1616)  lactated ringers infusion (has no administration in time range)  sodium chloride 0.9 % bolus 1,000 mL (1,000 mLs Intravenous New Bag/Given 05/16/20 1552)  HYDROmorphone (DILAUDID) injection 1 mg (1 mg Intravenous Given 05/16/20 1608)  ondansetron (ZOFRAN) injection 4 mg (4 mg Intravenous Given 05/16/20 1608)     ED Discharge Orders    None       Note:  This document was prepared using Dragon voice recognition software and may include unintentional dictation errors.   Duffy Bruce, MD 05/16/20 1701    Duffy Bruce, MD 05/16/20 [phone]

## 2020-05-16 NOTE — ED Notes (Signed)
First Nurse Note: Pt to ED via POV from wound care center. Pt is in NAD.

## 2020-05-16 NOTE — Progress Notes (Signed)
Regina Carpenter, Regina Carpenter ([id number]) Visit Report for 05/16/2020 Abuse/Suicide Risk Screen Details Patient Name: Regina Carpenter, Regina Carpenter Date of Service: 05/16/2020 9:15 AM Medical Record Number: [id number] Patient Account Number: [id number] Date of Birth/Sex: Dec 21, 1975 (44 y.o. F) Treating RN: Grover Canavan Primary Care Soumya Colson: Leeanne Mannan., Iona Beard Other Clinician: Referring Con Arganbright: Liliane Bade Treating Alline Pio/Extender: Melburn Hake, HOYT Weeks in Treatment: 0 Abuse/Suicide Risk Screen Items Answer ABUSE RISK SCREEN: Has anyone close to you tried to hurt or harm you recentlyo No Do you feel uncomfortable with anyone in your familyo No Has anyone forced you do things that you didnot want to doo No Electronic Signature(s) Signed: 05/16/2020 4:49:15 PM By: Grover Canavan Entered By: Grover Canavan on 05/16/2020 10:17:20 Regina Carpenter ([id number]) -------------------------------------------------------------------------------- Activities of Daily Living Details Patient Name: Regina Carpenter Date of Service: 05/16/2020 9:15 AM Medical Record Number: [id number] Patient Account Number: [id number] Date of Birth/Sex: 09/05/1976 (44 y.o. F) Treating RN: Grover Canavan Primary Care Caralyn Twining: Leeanne Mannan., Iona Beard Other Clinician: Referring Aiesha Leland: Liliane Bade Treating Silvia Markuson/Extender: Melburn Hake, HOYT Weeks in Treatment: 0 Activities of Daily Living Items Answer Activities of Daily Living (Please select one for each item) Drive Automobile Completely Able Take Medications Completely Able Use Telephone Completely Able Care for Appearance Completely Able Use Toilet Completely Able Bath / Shower Completely Able Dress Self Completely Able Feed Self Completely Able Walk Completely Able Get In / Out Bed Completely Able Housework Completely Able Prepare Meals Completely Able Handle Money Completely Able Shop for Self Completely Able Electronic Signature(s) Signed:  05/16/2020 4:49:15 PM By: Grover Canavan Entered By: Grover Canavan on 05/16/2020 10:17:54 Regina Carpenter ([id number]) -------------------------------------------------------------------------------- Education Screening Details Patient Name: Regina Carpenter Date of Service: 05/16/2020 9:15 AM Medical Record Number: [id number] Patient Account Number: [id number] Date of Birth/Sex: 03/06/1976 (43 y.o. F) Treating RN: Grover Canavan Primary Care Tonni Mansour: Leeanne Mannan., Iona Beard Other Clinician: Referring [place]iaka Brasington: Liliane Bade Treating Amulya Quintin/Extender: Sharalyn Ink in Treatment: 0 Primary Learner Assessed: Patient Learning Preferences/Education Level/Primary Language Learning Preference: Explanation, Demonstration Highest Education Level: High School Preferred Language: English Cognitive Barrier Language Barrier: No Translator Needed: No Memory Deficit: No Emotional Barrier: No Cultural/Religious Beliefs Affecting Medical Care: No Physical Barrier Impaired Vision: No Impaired Hearing: No Decreased Hand dexterity: No Knowledge/Comprehension Knowledge Level: High Comprehension Level: High Ability to understand written instructions: High Ability to understand verbal instructions: High Motivation Anxiety Level: Anxious Cooperation: Cooperative Education Importance: Acknowledges Need Interest in Health Problems: Asks Questions Perception: Coherent Willingness to Engage in Self-Management High Activities: Readiness to Engage in Self-Management High Activities: Electronic Signature(s) Signed: 05/16/2020 4:49:15 PM By: Grover Canavan Entered By: Grover Canavan on 05/16/2020 10:18:30 Regina Carpenter ([id number]) -------------------------------------------------------------------------------- Fall Risk Assessment Details Patient Name: Regina Carpenter Date of Service: 05/16/2020 9:15 AM Medical Record Number: [id number] Patient Account Number:  [id number] Date of Birth/Sex: 01-31-1976 (43 y.o. F) Treating RN: Grover Canavan Primary Care Flara Storti: Leeanne Mannan., Iona Beard Other Clinician: Referring Elisabella Hacker: Liliane Bade Treating Devita Nies/Extender: Melburn Hake, HOYT Weeks in Treatment: 0 Fall Risk Assessment Items Have you had 2 or more falls in the last 12 monthso 0 No Have you had any fall that resulted in injury in the last 12 monthso 0 No FALLS RISK SCREEN History of falling - immediate or within 3 months 0 No Secondary diagnosis (Do you have 2 or more medical diagnoseso) 0 No Ambulatory aid None/bed rest/wheelchair/nurse 0 No Crutches/cane/walker 0 No Furniture 0 No Intravenous therapy Access/Saline/Heparin Lock 0 No Gait/Transferring Normal/ bed rest/ wheelchair 0 No Weak (short steps  with or without shuffle, stooped but able to lift head while walking, may 0 No seek support from furniture) Impaired (short steps with shuffle, may have difficulty arising from chair, head down, impaired 0 No balance) Mental Status Oriented to own ability 0 No Electronic Signature(s) Signed: 05/16/2020 4:49:15 PM By: Grover Canavan Entered By: Grover Canavan on 05/16/2020 10:18:49 Regina Carpenter ([id number]) -------------------------------------------------------------------------------- Foot Assessment Details Patient Name: Regina Carpenter Date of Service: 05/16/2020 9:15 AM Medical Record Number: [id number] Patient Account Number: [id number] Date of Birth/Sex: Feb 25, 1976 (43 y.o. F) Treating RN: Grover Canavan Primary Care Tilla Wilborn: Leeanne Mannan., Iona Beard Other Clinician: Referring Solomon Skowronek: Liliane Bade Treating Kellan Boehlke/Extender: Melburn Hake, HOYT Weeks in Treatment: 0 Foot Assessment Items Site Locations + = Sensation present, - = Sensation absent, C = Callus, U = Ulcer R = Redness, W = Warmth, M = Maceration, PU = Pre-ulcerative lesion F = Fissure, S = Swelling, D = Dryness Assessment Right: Left: Other  Deformity: No No Prior Foot Ulcer: No No Prior Amputation: No No Charcot Joint: No No Ambulatory Status: Gait: Electronic Signature(s) Signed: 05/16/2020 4:49:15 PM By: Grover Canavan Entered By: Grover Canavan on 05/16/2020 10:19:17 Regina Carpenter ([id number]) -------------------------------------------------------------------------------- Nutrition Risk Screening Details Patient Name: Regina Carpenter Date of Service: 05/16/2020 9:15 AM Medical Record Number: [id number] Patient Account Number: [id number] Date of Birth/Sex: [birth year] (44 y.o. F) Treating RN: Grover Canavan Primary Care Brynnlee Cumpian: Leeanne Mannan., Iona Beard Other Clinician: Referring Deija Buhrman: Liliane Bade Treating Izaiah Tabb/Extender: Melburn Hake, HOYT Weeks in Treatment: 0 Height (in): 65 Weight (lbs): 183 Body Mass Index (BMI): 30.4 Nutrition Risk Screening Items Score Screening NUTRITION RISK SCREEN: I have an illness or condition that made me change the kind and/or amount of food I eat 0 No I eat fewer than two meals per day 0 No I eat few fruits and vegetables, or milk products 0 No I have three or more drinks of beer, liquor or wine almost every day 0 No I have tooth or mouth problems that make it hard for me to eat 0 No I don't always have enough money to buy the food I need 0 No I eat alone most of the time 0 No I take three or more different prescribed or over-the-counter drugs a day 0 No Without wanting to, I have lost or gained 10 pounds in the last six months 0 No I am not always physically able to shop, cook and/or feed myself 0 No Nutrition Protocols Good Risk Protocol 0 No interventions needed Moderate Risk Protocol High Risk Proctocol Risk Level: Good Risk Score: 0 Electronic Signature(s) Signed: 05/16/2020 4:49:15 PM By: Grover Canavan Entered By: Grover Canavan on 05/16/2020 10:19:07

## 2020-05-16 NOTE — ED Triage Notes (Addendum)
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  Pt has had increasing problems with wounds to feet for 2-3 months. Has seen PCP and vascular with no answer.  Asked for referral to wound because getting worse and they sent here today after initial visit.  Possible gangrene per wound clinic; foul odor from wounds on feet.  Necrosis to 2nd and 4th digit on right foot and to 5th digit left foot.  [facility] elevated WBC and wound problems.  Both leg warm, toes cool.  DP pulse WNL.

## 2020-05-16 NOTE — ED Notes (Signed)
No sign of transportation team- patient taken by ED tech to floor at this time.

## 2020-05-16 NOTE — Progress Notes (Addendum)
Regina Carpenter, Regina Carpenter ([id number]) Visit Report for 05/16/2020 Allergy List Details Patient Name: Regina Carpenter, Regina Carpenter Date of Service: 05/16/2020 9:15 AM Medical Record Number: [id number] Patient Account Number: [id number] Date of Birth/Sex: 04-06-1976 (44 y.o. F) Treating RN: Grover Canavan Primary Care Daphney Hopke: Liliane Bade Other Clinician: Referring Akina Maish: Liliane Bade Treating Ronda Kazmi/Extender: Melburn Hake, HOYT Weeks in Treatment: 0 Allergies Active Allergies sulfasalazine Reaction: rash Allergy Notes Electronic Signature(s) Signed: 05/16/2020 4:49:15 PM By: Grover Canavan Entered By: Grover Canavan on 05/16/2020 10:07:28 Regina Carpenter ([id number]) -------------------------------------------------------------------------------- Arrival Information Details Patient Name: Regina Carpenter Date of Service: 05/16/2020 9:15 AM Medical Record Number: [id number] Patient Account Number: [id number] Date of Birth/Sex: Jul 28, 1976 (44 y.o. F) Treating RN: Cornell Barman Primary Care Dennise Bamber: Leeanne Mannan., Iona Beard Other Clinician: Referring Regina Carpenter: Liliane Bade Treating Jazon Jipson/Extender: Melburn Hake, HOYT Weeks in Treatment: 0 Visit Information Patient Arrived: Ambulatory Arrival Time: 09:21 Accompanied By: self Transfer Assistance: None Patient Identification Verified: Yes Secondary Verification Process Completed: Yes Electronic Signature(s) Signed: 05/19/2020 4:17:57 PM By: Grover Canavan Previous Signature: 05/16/2020 4:49:15 PM Version By: Grover Canavan Entered By: Grover Canavan on 05/19/2020 15:23:59 Regina Carpenter ([id number]) -------------------------------------------------------------------------------- Clinic Level of Care Assessment Details Patient Name: Regina Carpenter Date of Service: 05/16/2020 9:15 AM Medical Record Number: [id number] Patient Account Number: [id number] Date of Birth/Sex: [birth year] (44 y.o. F) Treating RN: Cornell Barman Primary Care Stuti Sandin: Leeanne Mannan., Iona Beard Other Clinician: Referring Sybil Shrader: Liliane Bade Treating Kimo Bancroft/Extender: Melburn Hake, HOYT Weeks in Treatment: 0 Clinic Level of Care Assessment Items TOOL 2 Quantity Score []  - Use when only an EandM is performed on the INITIAL visit 0 ASSESSMENTS - Nursing Assessment / Reassessment X - General Physical Exam (combine w/ comprehensive assessment (listed just below) when performed on new 1 20 pt. evals) X- 1 25 Comprehensive Assessment (HX, ROS, Risk Assessments, Wounds Hx, etc.) ASSESSMENTS - Wound and Skin Assessment / Reassessment []  - Simple Wound Assessment / Reassessment - one wound 0 X- 3 5 Complex Wound Assessment / Reassessment - multiple wounds []  - 0 Dermatologic / Skin Assessment (not related to wound area) ASSESSMENTS - Ostomy and/or Continence Assessment and Care []  - Incontinence Assessment and Management 0 []  - 0 Ostomy Care Assessment and Management (repouching, etc.) PROCESS - Coordination of Care X - Simple Patient / Family Education for ongoing care 1 15 []  - 0 Complex (extensive) Patient / Family Education for ongoing care X- 1 10 Staff obtains Programmer, systems, Records, Test Results / Process Orders []  - 0 Staff telephones HHA, Nursing Homes / Clarify orders / etc []  - 0 Routine Transfer to another Facility (non-emergent condition) []  - 0 Routine Hospital Admission (non-emergent condition) X- 1 15 New Admissions / Biomedical engineer / Ordering NPWT, Apligraf, etc. X- 1 20 Emergency Hospital Admission (emergent condition) X- 1 10 Simple Discharge Coordination []  - 0 Complex (extensive) Discharge Coordination PROCESS - Special Needs []  - Pediatric / Minor Patient Management 0 []  - 0 Isolation Patient Management []  - 0 Hearing / Language / Visual special needs []  - 0 Assessment of Community assistance (transportation, D/C planning, etc.) []  - 0 Additional assistance / Altered mentation []   - 0 Support Surface(s) Assessment (bed, cushion, seat, etc.) INTERVENTIONS - Wound Cleansing / Measurement X - Wound Imaging (photographs - any number of wounds) 1 5 []  - 0 Wound Tracing (instead of photographs) []  - 0 Simple Wound Measurement - one wound X- 3 5 Complex Wound Measurement - multiple wounds Regina Carpenter ([id number]) []  - 0 Simple Wound Cleansing -  one wound X- 3 5 Complex Wound Cleansing - multiple wounds INTERVENTIONS - Wound Dressings []  - Small Wound Dressing one or multiple wounds 0 []  - 0 Medium Wound Dressing one or multiple wounds []  - 0 Large Wound Dressing one or multiple wounds []  - 0 Application of Medications - injection INTERVENTIONS - Miscellaneous []  - External ear exam 0 []  - 0 Specimen Collection (cultures, biopsies, blood, body fluids, etc.) []  - 0 Specimen(s) / Culture(s) sent or taken to Lab for analysis []  - 0 Patient Transfer (multiple staff / Civil Service fast streamer / Similar devices) []  - 0 Simple Staple / Suture removal (25 or less) []  - 0 Complex Staple / Suture removal (26 or more) []  - 0 Hypo / Hyperglycemic Management (close monitor of Blood Glucose) []  - 0 Ankle / Brachial Index (ABI) - do not check if billed separately Has the patient been seen at the hospital within the last three years: Yes Total Score: 165 Level Of Care: New/Established - Level 5 Electronic Signature(s) Signed: 05/16/2020 6:14:49 PM By: Gretta Cool, BSN, RN, CWS, Kim RN, BSN Entered By: Gretta Cool, BSN, RN, CWS, Kim on 05/16/2020 17:32:17 Regina Carpenter ([id number]) -------------------------------------------------------------------------------- Encounter Discharge Information Details Patient Name: Regina Carpenter Date of Service: 05/16/2020 9:15 AM Medical Record Number: [id number] Patient Account Number: [id number] Date of Birth/Sex: 04/24/1976 (43 y.o. F) Treating RN: Cornell Barman Primary Care Lille Karim: Liliane Bade Other Clinician: Referring Shelitha Magley: Liliane Bade Treating Cadence Minton/Extender: Melburn Hake, HOYT Weeks in Treatment: 0 Encounter Discharge Information Items Discharge Condition: Stable Ambulatory Status: Wheelchair Discharge Destination: Hospital Telephoned: Yes Spoke With: Triage Nurse Orders Sent: Yes Transportation: Private Auto Schedule Follow-up Appointment: No Clinical Summary of Care: Electronic Signature(s) Signed: 05/16/2020 5:34:11 PM By: Gretta Cool, BSN, RN, CWS, Kim RN, BSN Entered By: Gretta Cool, BSN, RN, CWS, Kim on 05/16/2020 17:34:11 Regina Carpenter ([id number]) -------------------------------------------------------------------------------- General Visit Notes Details Patient Name: Regina Carpenter Date of Service: 05/16/2020 9:15 AM Medical Record Number: [id number] Patient Account Number: [id number] Date of Birth/Sex: 11-24-1975 (43 y.o. F) Treating RN: Grover Canavan Primary Care Jilliam Bellmore: Leeanne Mannan., Iona Beard Other Clinician: Referring Jaceyon Strole: Liliane Bade Treating Diallo Ponder/Extender: Melburn Hake, HOYT Weeks in Treatment: 0 Notes Pt came in for initial evaluation. She has multiple necrotic lesions and several gangrenous toes. Jeri Cos PA came in to see pts wounds and advised pt to go the the ED for more immediate work up and blood flow testing. Pt verbalized understanding and stated she would go to the emergency room upon leaving the wound care center. Electronic Signature(s) Signed: 05/16/2020 4:49:15 PM By: Grover Canavan Entered By: Grover Canavan on 05/16/2020 10:37:50 Regina Carpenter ([id number]) -------------------------------------------------------------------------------- Lower Extremity Assessment Details Patient Name: Regina Carpenter Date of Service: 05/16/2020 9:15 AM Medical Record Number: [id number] Patient Account Number: [id number] Date of Birth/Sex: 07-14-1976 (44 y.o. F) Treating RN: Grover Canavan Primary Care Maxie Debose: Liliane Bade Other Clinician: Referring  Alesandra Smart: Liliane Bade Treating Gaylyn Berish/Extender: Melburn Hake, HOYT Weeks in Treatment: 0 Edema Assessment Assessed: Shirlyn Goltz: Yes] [Right: Yes] [Left: Edema] [Right: :] Vascular Assessment Pulses: Dorsalis Pedis Palpable: [Left:Yes] [Right:Yes] Posterior Tibial Palpable: [Left:Yes] [Right:Yes] Electronic Signature(s) Signed: 05/16/2020 4:49:15 PM By: Grover Canavan Entered By: Grover Canavan on 05/16/2020 10:19:52 Regina Carpenter ([id number]) -------------------------------------------------------------------------------- Multi Wound Chart Details Patient Name: Regina Carpenter Date of Service: 05/16/2020 9:15 AM Medical Record Number: [id number] Patient Account Number: [id number] Date of Birth/Sex: [birth year] (44 y.o. F) Treating RN: Cornell Barman Primary Care Lavon Horn: Leeanne Mannan., Iona Beard Other Clinician: Referring Ricky Doan: Liliane Bade Treating Keone Kamer/Extender:  STONE III, HOYT Weeks in Treatment: 0 Vital Signs Height(in): 65 Pulse(bpm): 119 Weight(lbs): 183 Blood Pressure(mmHg): 134/95 Body Mass Index(BMI): 30 Temperature(F): 98.5 Respiratory Rate(breaths/min): 16 Photos: Wound Location: Left, Proximal, Posterior Lower Leg Left, Anterior Lower Leg Left, Lateral Lower Leg Wounding Event: Gradually Appeared Gradually Appeared Gradually Appeared Primary Etiology: Inflammatory Inflammatory Inflammatory Secondary Etiology: Vasculitis Vasculitis Vasculitis Date Acquired: 03/16/2020 03/16/2020 03/16/2020 Weeks of Treatment: 0 0 0 Wound Status: Open Open Open Measurements L x W x D (cm) 1x1.1x0.1 1.3x1.3x0.1 0.5x0.5x0.1 Area (cm) : 0.864 1.327 0.196 Volume (cm) : 0.086 0.133 0.02 % Reduction in Area: 0.00% 0.00% 0.00% % Reduction in Volume: 0.00% 0.00% 0.00% Classification: Partial Thickness Partial Thickness Partial Thickness Exudate Amount: Medium Medium Medium Exudate Type: Serosanguineous Serosanguineous Serosanguineous Exudate Color: red, brown red,  brown red, brown Wound Margin: Flat and Intact Flat and Intact Flat and Intact Granulation Amount: None Present (0%) None Present (0%) None Present (0%) Necrotic Amount: Large (67-100%) Large (67-100%) Large (67-100%) Necrotic Tissue: Eschar Eschar Eschar Exposed Structures: Fat Layer (Subcutaneous Tissue): Fat Layer (Subcutaneous Tissue): Fat Layer (Subcutaneous Tissue): Yes Yes Yes Fascia: No Fascia: No Fascia: No Tendon: No Tendon: No Tendon: No Muscle: No Muscle: No Muscle: No Joint: No Joint: No Joint: No Bone: No Bone: No Bone: No Epithelialization: None None None Treatment Notes Electronic Signature(s) Signed: 05/16/2020 5:31:12 PM By: Gretta Cool, BSN, RN, CWS, Kim RN, BSN Entered By: Gretta Cool, BSN, RN, CWS, Kim on 05/16/2020 [place] ([id number]) -------------------------------------------------------------------------------- Multi-Disciplinary Care Plan Details Patient Name: Regina Carpenter Date of Service: 05/16/2020 9:15 AM Medical Record Number: [id number] Patient Account Number: [id number] Date of Birth/Sex: [birth year] (43 y.o. F) Treating RN: Cornell Barman Primary Care Ozell Juhasz: Leeanne Mannan., Iona Beard Other Clinician: Referring Akiem Urieta: Liliane Bade Treating Paeton Latouche/Extender: Sharalyn Ink in Treatment: 0 Active Inactive Electronic Signature(s) Signed: 05/26/2020 1:38:39 PM By: Gretta Cool, BSN, RN, CWS, Kim RN, BSN Previous Signature: 05/16/2020 5:30:59 PM Version By: Gretta Cool BSN, RN, CWS, Kim RN, BSN Previous Signature: 05/16/2020 5:17:58 PM Version By: Gretta Cool BSN, RN, CWS, Kim RN, BSN Entered By: Gretta Cool, BSN, RN, CWS, Kim on 05/26/2020 13:38:39 Regina Carpenter ([id number]) -------------------------------------------------------------------------------- Pain Assessment Details Patient Name: Regina Carpenter Date of Service: 05/16/2020 9:15 AM Medical Record Number: [id number] Patient Account Number: [id number] Date of Birth/Sex: 31-Oct-1975 (43  y.o. F) Treating RN: Cornell Barman Primary Care Caryl Manas: Leeanne Mannan., Iona Beard Other Clinician: Referring Issachar Broady: Liliane Bade Treating Trula Frede/Extender: Melburn Hake, HOYT Weeks in Treatment: 0 Active Problems Location of Pain Severity and Description of Pain Patient Has Paino Yes Site Locations Rate the pain. Current Pain Level: 8 Character of Pain Describe the Pain: Aching, Burning, Throbbing Pain Management and Medication Current Pain Management: Medication: Yes Notes Patient has pain when she puts pressure on her toe walking. Electronic Signature(s) Signed: 05/16/2020 4:49:15 PM By: Grover Canavan Signed: 05/16/2020 6:14:49 PM By: Gretta Cool, BSN, RN, CWS, Kim RN, BSN Entered By: Grover Canavan on 05/16/2020 09:39:19 Regina Carpenter, Regina Carpenter ([id number]) -------------------------------------------------------------------------------- Patient/Caregiver Education Details Patient Name: Regina Carpenter Date of Service: 05/16/2020 9:15 AM Medical Record Number: [id number] Patient Account Number: [id number] Date of Birth/Gender: 1976/07/06 (44 y.o. F) Treating RN: Cornell Barman Primary Care Physician: Leeanne Mannan., Iona Beard Other Clinician: Referring Physician: Liliane Bade Treating Physician/Extender: Sharalyn Ink in Treatment: 0 Education Assessment Education Provided To: Patient Education Topics Provided Wound/Skin Impairment: Handouts: Other: patient send to ED for [facility] Methods: Explain/Verbal Responses: State content correctly Electronic Signature(s) Signed: 05/16/2020 6:14:49 PM By: Gretta Cool, BSN, RN, CWS,  Maudie Mercury RN, BSN Entered By: Gretta Cool, BSN, RN, CWS, Kim on 05/16/2020 17:33:34 Regina Carpenter ([id number]) -------------------------------------------------------------------------------- Wound Assessment Details Patient Name: Regina Carpenter Date of Service: 05/16/2020 9:15 AM Medical Record Number: [id number] Patient Account Number: [id number] Date  of Birth/Sex: [birth year] (44 y.o. F) Treating RN: Grover Canavan Primary Care Rexanne Inocencio: Leeanne Mannan., Iona Beard Other Clinician: Referring Adrinne Sze: Liliane Bade Treating Jullia Mulligan/Extender: Melburn Hake, HOYT Weeks in Treatment: 0 Wound Status Wound Number: 1 Primary Etiology: Inflammatory Wound Location: Left, Proximal, Posterior Lower Leg Secondary Etiology: Vasculitis Wounding Event: Gradually Appeared Wound Status: Open Date Acquired: 03/16/2020 Weeks Of Treatment: 0 Clustered Wound: No Photos Wound Measurements Length: (cm) 1 Width: (cm) 1.1 Depth: (cm) 0.1 Area: (cm) 0.864 Volume: (cm) 0.086 % Reduction in Area: 0% % Reduction in Volume: 0% Epithelialization: None Tunneling: No Undermining: No Wound Description Classification: Partial Thickness Wound Margin: Flat and Intact Exudate Amount: Medium Exudate Type: Serosanguineous Exudate Color: red, brown Foul Odor After Cleansing: No Slough/Fibrino No Wound Bed Granulation Amount: None Present (0%) Exposed Structure Necrotic Amount: Large (67-100%) Fascia Exposed: No Necrotic Quality: Eschar Fat Layer (Subcutaneous Tissue) Exposed: Yes Tendon Exposed: No Muscle Exposed: No Joint Exposed: No Bone Exposed: No Electronic Signature(s) Signed: 05/16/2020 4:49:15 PM By: Grover Canavan Entered By: Grover Canavan on 05/16/2020 10:04:20 Regina Carpenter ([id number]) -------------------------------------------------------------------------------- Wound Assessment Details Patient Name: Regina Carpenter Date of Service: 05/16/2020 9:15 AM Medical Record Number: [id number] Patient Account Number: [id number] Date of Birth/Sex: [birth year] (44 y.o. F) Treating RN: Grover Canavan Primary Care Odyssey Vasbinder: Leeanne Mannan., Iona Beard Other Clinician: Referring Laiklyn Pilkenton: Liliane Bade Treating Cadance Raus/Extender: Melburn Hake, HOYT Weeks in Treatment: 0 Wound Status Wound Number: 2 Primary Etiology: Inflammatory Wound  Location: Left, Anterior Lower Leg Secondary Etiology: Vasculitis Wounding Event: Gradually Appeared Wound Status: Open Date Acquired: 03/16/2020 Weeks Of Treatment: 0 Clustered Wound: No Photos Wound Measurements Length: (cm) 1.3 Width: (cm) 1.3 Depth: (cm) 0.1 Area: (cm) 1.327 Volume: (cm) 0.133 % Reduction in Area: 0% % Reduction in Volume: 0% Epithelialization: None Tunneling: No Undermining: No Wound Description Classification: Partial Thickness Wound Margin: Flat and Intact Exudate Amount: Medium Exudate Type: Serosanguineous Exudate Color: red, brown Foul Odor After Cleansing: No Slough/Fibrino No Wound Bed Granulation Amount: None Present (0%) Exposed Structure Necrotic Amount: Large (67-100%) Fascia Exposed: No Necrotic Quality: Eschar Fat Layer (Subcutaneous Tissue) Exposed: Yes Tendon Exposed: No Muscle Exposed: No Joint Exposed: No Bone Exposed: No Electronic Signature(s) Signed: 05/16/2020 4:49:15 PM By: Grover Canavan Entered By: Grover Canavan on 05/16/2020 10:05:41 Regina Carpenter ([id number]) -------------------------------------------------------------------------------- Wound Assessment Details Patient Name: Regina Carpenter Date of Service: 05/16/2020 9:15 AM Medical Record Number: [id number] Patient Account Number: [id number] Date of Birth/Sex: 19-Oct-1975 (45 y.o. F) Treating RN: Grover Canavan Primary Care Chela Sutphen: Leeanne Mannan., Iona Beard Other Clinician: Referring Dakiyah Heinke: Liliane Bade Treating Karsyn Rochin/Extender: Melburn Hake, HOYT Weeks in Treatment: 0 Wound Status Wound Number: 3 Primary Etiology: Inflammatory Wound Location: Left, Lateral Lower Leg Secondary Etiology: Vasculitis Wounding Event: Gradually Appeared Wound Status: Open Date Acquired: 03/16/2020 Weeks Of Treatment: 0 Clustered Wound: No Photos Wound Measurements Length: (cm) 0.5 Width: (cm) 0.5 Depth: (cm) 0.1 Area: (cm) 0.196 Volume: (cm) 0.02 %  Reduction in Area: 0% % Reduction in Volume: 0% Epithelialization: None Tunneling: No Undermining: No Wound Description Classification: Partial Thickness Wound Margin: Flat and Intact Exudate Amount: Medium Exudate Type: Serosanguineous Exudate Color: red, brown Foul Odor After Cleansing: No Slough/Fibrino No Wound Bed Granulation Amount: None Present (0%) Exposed Structure Necrotic Amount: Large (67-100%) Fascia Exposed: No Necrotic Quality:  Eschar Fat Layer (Subcutaneous Tissue) Exposed: Yes Tendon Exposed: No Muscle Exposed: No Joint Exposed: No Bone Exposed: No Electronic Signature(s) Signed: 05/16/2020 4:49:15 PM By: Grover Canavan Entered By: Grover Canavan on 05/16/2020 10:06:12 Regina Carpenter ([id number]) -------------------------------------------------------------------------------- Vitals Details Patient Name: Regina Carpenter Date of Service: 05/16/2020 9:15 AM Medical Record Number: [id number] Patient Account Number: [id number] Date of Birth/Sex: 07-18-1976 (44 y.o. F) Treating RN: Cornell Barman Primary Care Lott Seelbach: Leeanne Mannan., Iona Beard Other Clinician: Referring Koi Zangara: Liliane Bade Treating Lance Huaracha/Extender: Melburn Hake, HOYT Weeks in Treatment: 0 Vital Signs Time Taken: 09:25 Temperature (F): 98.5 Height (in): 65 Pulse (bpm): 119 Source: Stated Respiratory Rate (breaths/min): 16 Weight (lbs): 183 Blood Pressure (mmHg): 134/95 Source: Stated Reference Range: 80 - 120 mg / dl Body Mass Index (BMI): 30.4 Electronic Signature(s) Signed: 05/16/2020 4:49:15 PM By: Grover Canavan Entered By: Grover Canavan on 05/16/2020 09:39:24

## 2020-05-16 NOTE — ED Notes (Signed)
RN called lab to check on status of results. Per lab they have not ran test yet.

## 2020-05-16 NOTE — H&P (Signed)
History and Physical    Regina Carpenter ELF:[id number] DOB: 04-Jun-1976 DOA: 05/16/2020  PCP: Emmaline Kluver., MD Patient coming from: Home  I have personally briefly reviewed patient's old medical records in [place]  Chief Complaint: Wound infection  HPI: Regina Carpenter is a 44 y.o. female with medical history significant of rheumatoid arthritis, polyarteritis vasculitis, chronic bilateral foot wounds who presents to the emergency department for concern of worsening wound of the bilateral feet.  The patient states that she has known severe vasculopathy and over the past several months her lesions started as painful ulcers on multiple toes that is now progressed into a gangrenous state.  The wounds have become increasingly painful and had some foul-smelling drainage from the area as well.  The patient was at the wound care center today and on presentation there the patient was sent here for further evaluation  Patient denies any fevers or chills denies any weight loss, denies any other systemic symptoms.  Her only complaints are pain in bilateral lower extremities.  On review of her rheumatology notes from Dr. Jefm Bryant she has known systemic vasculitis and no ischemic disease of the digits on the right leg.  She also has a known large 3 to 4 cm ulcer on the right lateral leg.  She states these ulcers have been worsening in severity and pain.  On my evaluation in the ED patient is resting comfortably in bed.  She is in minimal distress.  On interview it appears that patient was previously on disease modifying agents such as methotrexate however has been taken off by her rheumatologist.  She is currently just on prednisone.  Patient was given intravenous fluids and broad-spectrum antibiotics given concern for sepsis secondary to wound infection.  ED doctor called consultation with vascular surgery and podiatry.  Vascular surgery indicated the patient will require admission for IV  antibiotics and likely angiogram next week.  Podiatry consult is pending.  ED Course: On presentation patient was found to have ischemic disease on the bilateral lower extremities.  Plain film ultrasound was pursued.  No osteomyelitis was noted.  There was some mention of air within the soft tissue.  Laboratory investigation revealed a leukocytosis of 58.5 with neutrophilic predominance.  Metabolic profile largely within normal limits.  Patient did have an elevated lactic acid of 2.2 on presentation which did resolved with administration of IV fluids.  Hospitalist contacted for admission.  Patient will be admitted with the presumptive diagnosis of sepsis secondary to wound infection.  Review of Systems: As per HPI otherwise 14 point review of systems negative.    Past Medical History:  Diagnosis Date  . Arthralgia of right knee   . Arthritis   . Cellulitis of left leg   . Collagen vascular disease ([place])   . Lymphadenopathy, mesenteric   . Right hand pain   . Tobacco abuse     Past Surgical History:  Procedure Laterality Date  . denies       reports that she has been smoking cigarettes. She has been smoking about 0.50 packs per day. She has never used smokeless tobacco. She reports that she does not drink alcohol and does not use drugs.  Allergies  Allergen Reactions  . Ibuprofen Other (See Comments)    Pt states that she sweats profusely after taking ibuprofen Other reaction(s): Other (See Comments) "sweating:"  . Sulfasalazine Rash    Family History  Problem Relation Age of Onset  . Leukemia Maternal Grandfather   .  Alcohol abuse Father    No known family history of systemic vasculitis  Prior to Admission medications   Medication Sig Start Date End Date Taking? Authorizing Provider  cephALEXin (KEFLEX) 500 MG capsule Take 1 capsule (500 mg total) by mouth 3 (three) times daily. 04/07/20   Johnn Hai, PA-C  folic acid (FOLVITE) 1 MG tablet Take 1 mg by mouth daily.     [provider]  meloxicam (MOBIC) 15 MG tablet Take 1 tablet (15 mg total) by mouth daily. 12/16/19 12/15/20  Johnn Hai, PA-C  methotrexate (RHEUMATREX) 2.5 MG tablet Take 2.5 mg by mouth once a week. Caution:Chemotherapy. Protect from light.    [provider]  oxyCODONE-acetaminophen (PERCOCET) 5-325 MG tablet Take 1 tablet by mouth every 6 (six) hours as needed for severe pain. 04/07/20   Johnn Hai, PA-C  predniSONE (DELTASONE) 5 MG tablet Take 5 mg by mouth daily with breakfast.    [provider]    Physical Exam: Vitals:   05/16/20 1547 05/16/20 1630 05/16/20 1700 05/16/20 1705  BP: (!) 127/101 (!) 139/102 (!) 152/112   Pulse: (!) 106 99 (!) 50 100  Resp: 16 19 18 16   Temp:      TempSrc:      SpO2: 100% 100% (!) 74% 100%  Weight:      Height:         Vitals:   05/16/20 1547 05/16/20 1630 05/16/20 1700 05/16/20 1705  BP: (!) 127/101 (!) 139/102 (!) 152/112   Pulse: (!) 106 99 (!) 50 100  Resp: 16 19 18 16   Temp:      TempSrc:      SpO2: 100% 100% (!) 74% 100%  Weight:      Height:      Constitutional: NAD, calm, comfortable, appears nervous Eyes: PERRL, lids and conjunctivae normal ENMT: Mucous membranes are moist. Posterior pharynx clear of any exudate or lesions.poor dentition.  Neck: normal, supple, no masses, no thyromegaly Respiratory: clear to auscultation bilaterally, no wheezing, no crackles. Normal respiratory effort. No accessory muscle use.  Cardiovascular: Regular rate and rhythm, no murmurs / rubs / gallops. No extremity edema. 2+ pedal pulses. No carotid bruits.  Abdomen: Obese, no tenderness, no masses palpated. No hepatosplenomegaly. Bowel sounds positive.  Musculoskeletal: Right second toe ischemic.  30 palpable pulses.  No significant edema.  4 cm ulcer on right lateral leg.  1 cm lesion on right fourth toe.  1 cm raised lesion on left second toe. Skin: Scattered excoriations.  No induration Neurologic: CN 2-12  grossly intact. Sensation intact. Strength 5/5 in all 4.  Psychiatric: Normal judgment and insight. Alert and oriented x 3. Normal mood.    Labs on Admission: I have personally reviewed following labs and imaging studies  CBC: Recent Labs  Lab 05/16/20 1141  WBC 19.8*  NEUTROABS 17.2*  HGB 11.7*  HCT 37.3  MCV 79.0*  PLT 749*   Basic Metabolic Panel: Recent Labs  Lab 05/16/20 1141  NA 135  K 4.2  CL 100  CO2 25  GLUCOSE 177*  BUN 9  CREATININE 0.73  CALCIUM 9.6   GFR: Estimated Creatinine Clearance: 95.5 mL/min (by C-G formula based on SCr of 0.73 mg/dL). Liver Function Tests: Recent Labs  Lab 05/16/20 1141  AST 17  ALT 18  ALKPHOS 64  BILITOT 0.6  PROT 7.8  ALBUMIN 3.6   No results for input(s): LIPASE, AMYLASE in the last 168 hours. No results for input(s): AMMONIA  in the last 168 hours. Coagulation Profile: No results for input(s): INR, PROTIME in the last 168 hours. Cardiac Enzymes: No results for input(s): CKTOTAL, CKMB, CKMBINDEX, TROPONINI in the last 168 hours. BNP (last 3 results) No results for input(s): PROBNP in the last 8760 hours. HbA1C: No results for input(s): HGBA1C in the last 72 hours. CBG: No results for input(s): GLUCAP in the last 168 hours. Lipid Profile: No results for input(s): CHOL, HDL, LDLCALC, TRIG, CHOLHDL, LDLDIRECT in the last 72 hours. Thyroid Function Tests: No results for input(s): TSH, T4TOTAL, FREET4, T3FREE, THYROIDAB in the last 72 hours. Anemia Panel: No results for input(s): VITAMINB12, FOLATE, FERRITIN, TIBC, IRON, RETICCTPCT in the last 72 hours. Urine analysis:    Component Value Date/Time   COLORURINE YELLOW (A) 11/05/2018 0202   APPEARANCEUR CLEAR (A) 11/05/2018 0202   LABSPEC 1.014 11/05/2018 0202   PHURINE 6.0 11/05/2018 0202   GLUCOSEU NEGATIVE 11/05/2018 0202   HGBUR LARGE (A) 11/05/2018 0202   BILIRUBINUR NEGATIVE 11/05/2018 0202   KETONESUR NEGATIVE 11/05/2018 0202   PROTEINUR 30 (A) 11/05/2018  0202   NITRITE NEGATIVE 11/05/2018 0202   LEUKOCYTESUR TRACE (A) 11/05/2018 0202    Radiological Exams on Admission: DG Foot 2 Views Right  Result Date: 05/16/2020 CLINICAL DATA:  Necrosis of the second and fourth digit on the RIGHT foot. Necrosis of the fifth digit on the LEFT foot. EXAM: RIGHT FOOT - 2 VIEW COMPARISON:  None. FINDINGS: No acute fracture or dislocation. Joint spaces and alignment are maintained. No area of erosion or osseous destruction on these limited views. No unexpected radiopaque foreign body. There is lucency projecting over the second and fourth toes. This may reflect subcutaneous air versus air trapped underneath overlying bandage. Soft tissue edema. IMPRESSION: 1. No definitive radiographic evidence of osteomyelitis. 2. Lucency projecting over the second and fourth toes may reflect subcutaneous air and/or air trapped underneath overlying bandage. Recommend correlation with physical exam. Electronically Signed   By: Valentino Saxon MD   On: 05/16/2020 13:09   DG Toe 5th Left  Result Date: 05/16/2020 CLINICAL DATA:  Necrosis of the fifth toe EXAM: DG TOE 5TH LEFT COMPARISON:  None. FINDINGS: Evaluation for fine osseous detail is limited secondary to overlying bandage. No acute fracture or dislocation. Joint spaces and alignment are maintained. No area of erosion or osseous destruction. No unexpected radiopaque foreign body. Soft tissues are unremarkable. IMPRESSION: No definitive radiographic evidence of osteomyelitis. Electronically Signed   By: Valentino Saxon MD   On: 05/16/2020 13:10    EKG: Independently reviewed.  NSR  Assessment/Plan Active Problems:   Sepsis ([place])  Wound infection in the setting of a systemic vasculitis Sepsis secondary to above Lactic acidosis secondary to above Patient meets sepsis criteria with leukocytosis of 19.8, tachycardia, elevated lactic acid Presumptive source is wound infection Vascular surgery contacted from ED Podiatry  contacted from ED Patient started on broad-spectrum antibiotics in the emergency department IV fluids given Plan: Admit inpatient, MedSurg status Sepsis protocol IV fluids, lactated Ringer's 30 mils per kilogram bolus followed by normal saline at 75 cc/h IV vancomycin, pharmacy dosing Rocephin 2 g every 24 hours Flagyl 500 mg IV every 8 hours Follow blood cultures Titrate antibiotics as appropriate Vascular consult Podiatry consult Pain control as needed Advance diet today, n.p.o. after midnight  Rheumatoid arthritis Systemic vasculitis Patient known to Dr. Jefm Bryant Recently taken off methotrexate and switched to Cytoxan Plan: Continue Cytoxan 50 mg daily Continue prednisone 5 mg daily  Hypertension Amlodipine 2.5  mg daily per home dose    DVT prophylaxis: Lovenox Code Status: Full Family Communication: None at bedside Disposition Plan: Anticipate return to previous home environment  consults called: Vascular Dr. Lucky Cowboy, podiatry Dr. April Manson Admission status: Inpatient, MedSurg   Sidney Ace MD [facility]   If 7PM-7AM, please contact night-coverage   05/16/2020, 5:21 PM

## 2020-05-16 NOTE — Progress Notes (Addendum)
Regina Carpenter ([id number]) Visit Report for 05/16/2020 Chief Complaint Document Details Patient Name: Regina Carpenter, Regina Carpenter Date of Service: 05/16/2020 9:15 AM Medical Record Number: [id number] Patient Account Number: [id number] Date of Birth/Sex: [birth year] (44 y.o. F) Treating RN: Cornell Barman Primary Care Provider: Leeanne Mannan., Iona Beard Other Clinician: Referring Provider: Liliane Bade Treating Provider/Extender: Melburn Hake, Savon Bordonaro Weeks in Treatment: 0 Information Obtained from: Patient Chief Complaint Multiple LE ulcers with gangrene Electronic Signature(s) Signed: 05/16/2020 10:16:27 AM By: Worthy Keeler PA-C Entered By: Worthy Keeler on 05/16/2020 10:16:27 Regina Carpenter ([id number]) -------------------------------------------------------------------------------- HPI Details Patient Name: Regina Carpenter Date of Service: 05/16/2020 9:15 AM Medical Record Number: [id number] Patient Account Number: [id number] Date of Birth/Sex: [birth year] (43 y.o. F) Treating RN: Cornell Barman Primary Care Provider: Leeanne Mannan., Iona Beard Other Clinician: Referring Provider: Liliane Bade Treating Provider/Extender: Melburn Hake, Ansen Sayegh Weeks in Treatment: 0 History of Present Illness HPI Description: 05/16/2020 upon evaluation today patient unfortunately is doing quite poorly in regard to her bilateral lower extremities. She has multiple toes which are actually necrotic and showing evidence of gangrene. She also has a wound at the base of her toes of the right foot which is very concerning to me. Along with all of this she tells me that she is having discomfort as well there is also odor noted which is again concerning to me. She has not had an arterial study that we can find in epic. With that being said obviously I think this is something that she likely needs to have ASAP. Also I suggested to the patient that the concern with the toes is that they are to the point that I do not believe  just standard wound care can likely save these she is likely can require amputation. But again she does not want to proceed with anything of that sort until it is confirmed that she has good arterial flow. All in all I really feel like she would be best served by going to the ER for evaluation and treatment in order to consult with the appropriate providers in a more timely fashion than what we can do in the outpatient setting. Electronic Signature(s) Signed: 05/16/2020 10:17:45 AM By: Worthy Keeler PA-C Entered By: Worthy Keeler on 05/16/2020 10:17:45 Regina Carpenter ([id number]) -------------------------------------------------------------------------------- Physical Exam Details Patient Name: Regina Carpenter Date of Service: 05/16/2020 9:15 AM Medical Record Number: [id number] Patient Account Number: [id number] Date of Birth/Sex: 1976/06/28 (43 y.o. F) Treating RN: Cornell Barman Primary Care Provider: Leeanne Mannan., Iona Beard Other Clinician: Referring Provider: Liliane Bade Treating Provider/Extender: Melburn Hake, Prabhleen Montemayor Weeks in Treatment: 0 Constitutional sitting or standing blood pressure is within target range for patient.. pulse regular and within target range for patient.Marland Kitchen respirations regular, non- labored and within target range for patient.Marland Kitchen temperature within target range for patient.. Well-nourished and well-hydrated in no acute distress. Eyes conjunctiva clear no eyelid edema noted. pupils equal round and reactive to light and accommodation. Ears, Nose, Mouth, and Throat no gross abnormality of ear auricles or external auditory canals. normal hearing noted during conversation. mucus membranes moist. Respiratory normal breathing without difficulty. Cardiovascular 2+ dorsalis pedis/posterior tibialis pulses. Musculoskeletal Patient unable to walk without assistance. Psychiatric this patient is able to make decisions and demonstrates good insight into disease process.  Alert and Oriented x 3. pleasant and cooperative. Notes Upon inspection patient had multiple areas of gangrene over the lower extremities in regard to her toes bilaterally. These appear to be at the point where she is likely going to  require amputation. She also has wounds on her legs as well as the base of the toes on the right foot which have me very concerned. My suggestion at this point for the patient was that we likely need to have her seen at the ER ASAP so she can get a more emergent evaluation including likely arterial studies along with provider evaluation to see the best treatment course going forward at this point. The patient is understanding and in agreement with the plan. She also has cool extremities in regard to her toes. Electronic Signature(s) Signed: 05/16/2020 10:19:06 AM By: Worthy Keeler PA-C Entered By: Worthy Keeler on 05/16/2020 10:19:06 Regina Carpenter ([id number]) -------------------------------------------------------------------------------- Physician Orders Details Patient Name: Regina Carpenter Date of Service: 05/16/2020 9:15 AM Medical Record Number: [id number] Patient Account Number: [id number] Date of Birth/Sex: 12-31-1975 (43 y.o. 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 F) Treating RN: Cornell Barman Primary Care Provider: Leeanne Mannan., Iona Beard Other Clinician: Referring Provider: Liliane Bade Treating Provider/Extender: Melburn Hake, Dejour Vos Weeks in Treatment: 0 Active Problems ICD-10 Encounter Code Description Active Date MDM Diagnosis I96 Gangrene, not elsewhere classified 05/16/2020 No Yes L97.828 Non-pressure chronic ulcer of other part of left lower leg with other 05/16/2020 No Yes specified severity L97.818 Non-pressure chronic ulcer of other part of right lower leg with other 05/16/2020 No Yes specified severity Inactive Problems Resolved Problems Electronic Signature(s) Signed: 05/16/2020 10:16:12 AM By: Worthy Keeler PA-C Entered By: Worthy Keeler on 05/16/2020 10:16:12 Regina Carpenter ([id number]) -------------------------------------------------------------------------------- Progress Note Details Patient Name: Regina Carpenter Date of Service: 05/16/2020 9:15 AM Medical Record Number: [id number] Patient Account Number: [id number] Date of Birth/Sex: 04-May-1976 (43 y.o. F) Treating RN: Cornell Barman Primary Care Provider: Leeanne Mannan., Iona Beard Other Clinician: Referring Provider: Liliane Bade Treating Provider/Extender: Melburn Hake, Vianey Caniglia Weeks in Treatment: 0 Subjective Chief Complaint Information obtained from Patient Multiple LE ulcers with gangrene History of Present Illness (HPI) 05/16/2020 upon evaluation today patient unfortunately is doing quite poorly in regard to her bilateral lower extremities. She has multiple toes which are actually necrotic and showing evidence of gangrene. She also has a wound at the base of her toes of the right foot  which is very concerning to me. Along with all of this she tells me that she is having discomfort as well there is also odor noted which is again concerning to me. She has not had an arterial study that we can find in epic. With that being said obviously I think this is something that she likely needs to have ASAP. Also I suggested to the patient that the concern with the toes is that they are to the point that I do not believe just standard wound care can likely save these she is  likely can require amputation. But again she does not want to proceed with anything of that sort until it is confirmed that she has good arterial flow. All in all I really feel like she would be best served by going to the ER for evaluation and treatment in order to consult with the appropriate providers in a more timely fashion than what we can do in the outpatient setting. Patient History Allergies sulfasalazine (Reaction: rash) Family History Cancer - Maternal Grandparents, Diabetes - Child, Hypertension - Mother, No family history of Heart Disease, Hereditary Spherocytosis, Kidney Disease, Lung Disease, Seizures, Stroke, Thyroid Problems, Tuberculosis. Social History Current every day smoker - 10 cigarettes per day, Marital Status - Single, Alcohol Use - Never, Drug Use - No History, Caffeine Use - Daily. Medical History Eyes Denies history of Cataracts, Glaucoma, Optic Neuritis Ear/Nose/Mouth/Throat Denies history of Chronic sinus problems/congestion, Middle ear problems Hematologic/Lymphatic Patient has history of Anemia Denies history of Hemophilia, Human Immunodeficiency Virus, Lymphedema, Sickle Cell Disease Respiratory Denies history of Aspiration, Asthma, Chronic Obstructive Pulmonary Disease (COPD), Pneumothorax, Sleep Apnea, Tuberculosis Cardiovascular Denies history of Angina, Arrhythmia, Congestive Heart Failure, Coronary Artery Disease, Deep Vein Thrombosis, Hypertension, Hypotension, Myocardial  Infarction, Peripheral Arterial Disease, Peripheral Venous Disease, Phlebitis, Vasculitis Endocrine Denies history of Type I Diabetes, Type II Diabetes Genitourinary Denies history of End Stage Renal Disease Integumentary (Skin) Denies history of History of Burn, History of pressure wounds Musculoskeletal Patient has history of Rheumatoid Arthritis Denies history of Gout, Osteoarthritis, Osteomyelitis Neurologic Denies history of Dementia, Neuropathy, Quadriplegia, Paraplegia, Seizure Disorder Oncologic Denies history of Received Chemotherapy, Received Radiation Psychiatric Denies history of Anorexia/bulimia, Confinement Anxiety Medical And Surgical History 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 Eyes Denies complaints or symptoms of Dry Eyes, Vision Changes, Glasses / Contacts. ROCSI, HAZELBAKER ([id number]) Ear/Nose/Mouth/Throat Denies complaints or symptoms of Difficult clearing ears, Sinusitis. Hematologic/Lymphatic Denies complaints or symptoms of Bleeding / Clotting Disorders, Human Immunodeficiency Virus. Respiratory Denies complaints or symptoms of Chronic or frequent coughs, Shortness of Breath. Cardiovascular Denies complaints or symptoms of Chest pain, LE edema. Gastrointestinal Denies complaints or symptoms of Frequent diarrhea, Nausea, Vomiting. Endocrine Denies complaints or symptoms of Hepatitis, Thyroid disease, Polydypsia (Excessive Thirst). Genitourinary Denies complaints or symptoms of Kidney failure/ Dialysis, Incontinence/dribbling. Immunological Denies complaints or symptoms of Hives, Itching. Integumentary (Skin) Complains or has symptoms of Wounds - multiple lower extremity wounds. Denies complaints or symptoms of Bleeding or bruising tendency, Breakdown, Swelling,  multiple lesions on lower extremities 3 gangrene toes Musculoskeletal Denies complaints or symptoms of Muscle Pain, Muscle Weakness. Neurologic Denies complaints or symptoms of Numbness/parasthesias, Focal/Weakness. Psychiatric Denies complaints or symptoms of Anxiety, Claustrophobia. Objective Constitutional sitting or standing blood pressure is within target range for patient.. pulse regular and within target range for patient.Marland Kitchen respirations regular, non- labored and within target range for patient.Marland Kitchen temperature within target range for patient.. Well-nourished and well-hydrated in no acute distress. Vitals Time Taken: 9:25 AM, Height: 65 in, Source: Stated, Weight: 183 lbs, Source: Stated, BMI: 30.4, Temperature: 98.5 F, Pulse: 119 bpm, Respiratory Rate: 16 breaths/min, Blood Pressure: 134/95 mmHg. Eyes conjunctiva clear no eyelid edema noted. pupils equal round and reactive to light and accommodation. Ears, Nose, Mouth, and Throat no gross abnormality of ear auricles or external auditory canals. normal hearing noted during conversation. mucus membranes moist. Respiratory normal breathing without difficulty. Cardiovascular 2+ dorsalis pedis/posterior tibialis pulses. Musculoskeletal Patient  unable to walk without assistance. Psychiatric this patient is able to make decisions and demonstrates good insight into disease process. Alert and Oriented x 3. pleasant and cooperative. General Notes: Upon inspection patient had multiple areas of gangrene over the lower extremities in regard to her toes bilaterally. These appear to be at the point where she is likely going to require amputation. She also has wounds on her legs as well as the base of the toes on the right foot which have me very concerned. My suggestion at this point for the patient was that we likely need to have her seen at the ER ASAP so she can get a more emergent evaluation including likely arterial studies along with  provider evaluation to see the best treatment course going forward at this point. The patient is understanding and in agreement with the plan. She also has cool extremities in regard to her toes. Integumentary (Hair, Skin) Wound #1 status is Open. Original cause of wound was Gradually Appeared. The wound is located on the Left,Proximal,Posterior Lower Leg. The wound measures 1cm length x 1.1cm width x 0.1cm depth; 0.864cm^2 area and 0.086cm^3 volume. There is Fat Layer (Subcutaneous Tissue) Exposed exposed. There is no tunneling or undermining noted. There is a medium amount of serosanguineous drainage noted. The wound margin is flat and intact. There is no granulation within the wound bed. There is a large (67-100%) amount of necrotic tissue within the wound bed including Eschar. Wound #2 status is Open. Original cause of wound was Gradually Appeared. The wound is located on the Left,Anterior Lower Leg. The wound measures 1.3cm length x 1.3cm width x 0.1cm depth; 1.327cm^2 area and 0.133cm^3 volume. There is Fat Layer (Subcutaneous Tissue) PARADISE, VENSEL ([id number]) Exposed exposed. There is no tunneling or undermining noted. There is a medium amount of serosanguineous drainage noted. The wound margin is flat and intact. There is no granulation within the wound bed. There is a large (67-100%) amount of necrotic tissue within the wound bed including Eschar. Wound #3 status is Open. Original cause of wound was Gradually Appeared. The wound is located on the Left,Lateral Lower Leg. The wound measures 0.5cm length x 0.5cm width x 0.1cm depth; 0.196cm^2 area and 0.02cm^3 volume. There is Fat Layer (Subcutaneous Tissue) Exposed exposed. There is no tunneling or undermining noted. There is a medium amount of serosanguineous drainage noted. The wound margin is flat and intact. There is no granulation within the wound bed. There is a large (67-100%) amount of necrotic tissue within the wound  bed including Eschar. 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 Based on what I am seeing today with the gangrene noted my suggestion is good to be that we go ahead and have the patient go to the ER for further evaluation and treatment today. I Minna send a copy of the note as well with her at this point in order to give them the most information that I know. With that being said I really do not have a tremendous amount of information as of yet as I really feel like the patient needs an arterial study along with evaluation by specialist in order to see the best treatment course due to the gangrene of her toes noted bilaterally. 2. Also suggested  to the patient that again depending on the evaluation at the ER and where things stand going forward we can definitely see her back following for evaluation and treatment as needed. Right now I think she really needs a more emergent evaluation today in my opinion. Try to do the same things in outpatient setting that she needs in my opinion would take longer than that we want to take. We will see the patient back for evaluation depending on how things proceed as far as the ER evaluation is concerned. Electronic Signature(s) Signed: 05/16/2020 10:21:09 AM By: Worthy Keeler PA-C Entered By: Worthy Keeler on 05/16/2020 10:21:09 Regina Carpenter ([id number]) -------------------------------------------------------------------------------- ROS/PFSH Details Patient Name: Regina Carpenter Date of Service: 05/16/2020 9:15 AM Medical Record Number: [id number] Patient Account Number: [id number] Date of Birth/Sex: 01-Sep-1976 (43 y.o. F) Treating RN: Grover Canavan Primary  Care Provider: Leeanne Mannan., Iona Beard Other Clinician: Referring Provider: Liliane Bade Treating Provider/Extender: Melburn Hake, Yutaka Holberg Weeks in Treatment: 0 Constitutional Symptoms (General Health) Complaints and Symptoms: Negative for: Fatigue; Fever; Chills; Marked Weight Change Eyes Complaints and Symptoms: Negative for: Dry Eyes; Vision Changes; Glasses / Contacts Medical History: Negative for: Cataracts; Glaucoma; Optic Neuritis Ear/Nose/Mouth/Throat Complaints and Symptoms: Negative for: Difficult clearing ears; Sinusitis Medical History: Negative for: Chronic sinus problems/congestion; Middle ear problems Hematologic/Lymphatic Complaints and Symptoms: Negative for: Bleeding / Clotting Disorders; Human Immunodeficiency Virus Medical History: Positive for: Anemia Negative for: Hemophilia; Human Immunodeficiency Virus; Lymphedema; Sickle Cell Disease Respiratory Complaints and Symptoms: Negative for: Chronic or frequent coughs; Shortness of Breath Medical History: Negative for: Aspiration; Asthma; Chronic Obstructive Pulmonary Disease (COPD); Pneumothorax; Sleep Apnea; Tuberculosis Cardiovascular Complaints and Symptoms: Negative for: Chest pain; LE edema Medical History: Negative for: Angina; Arrhythmia; Congestive Heart Failure; Coronary Artery Disease; Deep Vein Thrombosis; Hypertension; Hypotension; Myocardial Infarction; Peripheral Arterial Disease; Peripheral Venous Disease; Phlebitis; Vasculitis Gastrointestinal Complaints and Symptoms: Negative for: Frequent diarrhea; Nausea; Vomiting Endocrine Complaints and Symptoms: Negative for: Hepatitis; Thyroid disease; Polydypsia (Excessive Thirst) Medical History: Negative for: Type I Diabetes; Type II Diabetes TASHEBA, HENSON ([id number]) Genitourinary Complaints and Symptoms: Negative for: Kidney failure/ Dialysis; Incontinence/dribbling Medical History: Negative for: End Stage Renal  Disease Immunological Complaints and Symptoms: Negative for: Hives; Itching Medical History: Past Medical History Notes: 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 Integumentary (Skin) Complaints and Symptoms: Positive for: Wounds - multiple lower extremity wounds Negative for: Bleeding or bruising tendency; Breakdown; Swelling Review of System Notes: multiple lesions on lower extremities 3 gangrene toes Medical History: Negative for: History of Burn; History of pressure wounds Musculoskeletal Complaints and Symptoms: Negative for: Muscle Pain; Muscle Weakness Medical History: Positive for: Rheumatoid Arthritis Negative for: Gout; Osteoarthritis; Osteomyelitis Neurologic Complaints and Symptoms: Negative for: Numbness/parasthesias; Focal/Weakness Medical History: Negative for: Dementia; Neuropathy; Quadriplegia; Paraplegia; Seizure Disorder Psychiatric Complaints and Symptoms: Negative for: Anxiety; Claustrophobia Medical History: Negative for: Anorexia/bulimia; Confinement Anxiety Oncologic Medical History: Negative for: Received Chemotherapy; Received Radiation Immunizations Pneumococcal Vaccine: Received Pneumococcal Vaccination: No Immunization Notes: unknown last tetanus vaccine Implantable Devices None JANAYLA, MARIK ([id number]) Family and Social History Cancer: Yes - Maternal Grandparents; Diabetes: Yes - Child; Heart Disease: No; Hereditary Spherocytosis: No; Hypertension: Yes - Mother; Kidney Disease: No; Lung Disease: No; Seizures: No; Stroke: No; Thyroid Problems: No; Tuberculosis: No; Current every day smoker - 10 cigarettes per day; Marital Status - Single; Alcohol Use: Never; Drug Use: No History; Caffeine Use: Daily; Financial Concerns: Yes; Food, Clothing or Shelter Needs: No; Support System Lacking: Yes; Transportation Concerns: Astronomer) Signed: 05/16/2020 4:49:15 PM  By: Grover Canavan Signed: 05/16/2020  5:00:44 PM By: Worthy Keeler PA-C Entered By: Grover Canavan on 05/16/2020 10:17:00 Regina Carpenter ([id number]) -------------------------------------------------------------------------------- SuperBill Details Patient Name: Regina Carpenter Date of Service: 05/16/2020 Medical Record Number: [id number] Patient Account Number: [id number] Date of Birth/Sex: 03-04-1976 (43 y.o. 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 Facility Procedures CPT4 Code: [id number] Description: [phone] - WOUND CARE VISIT-LEV 5 EST PT Modifier: Quantity: 1 Physician Procedures CPT4 Code: [id number] Description: WC PHYS LEVEL 3 o NEW PT Modifier: Quantity: 1 CPT4 Code: Description: ICD-10 Diagnosis Description I96 Gangrene, not elsewhere classified L97.828 Non-pressure chronic ulcer of other part of left lower leg with other spec L97.818 Non-pressure chronic ulcer of other part of right lower leg with other spe Modifier: ified severity cified severity Quantity: Electronic Signature(s) Signed: 05/16/2020 5:32:50 PM By: Gretta Cool, BSN, RN, CWS, Kim RN, BSN Previous Signature: 05/16/2020 10:21:30 AM Version By: Worthy Keeler PA-C Entered By: Gretta Cool, BSN, RN, CWS, Kim on 05/16/2020 17:32:49

## 2020-05-16 NOTE — Consult Note (Signed)
CODE SEPSIS - PHARMACY COMMUNICATION  **Broad Spectrum Antibiotics should be administered within 1 hour of Sepsis diagnosis**  Time Code Sepsis Called/Page Received: 1703  Antibiotics Ordered: Zosyn/Vancomycin  Time of 1st antibiotic administration: 1616  Additional action taken by pharmacy: none  If necessary, Name of Provider/Nurse Contacted: [place] ,PharmD Clinical Pharmacist  05/16/2020  6:12 PM

## 2020-05-17 DIAGNOSIS — L97519 Non-pressure chronic ulcer of other part of right foot with unspecified severity: Secondary | ICD-10-CM

## 2020-05-17 DIAGNOSIS — M318 Other specified necrotizing vasculopathies: Secondary | ICD-10-CM

## 2020-05-17 DIAGNOSIS — M069 Rheumatoid arthritis, unspecified: Secondary | ICD-10-CM

## 2020-05-17 DIAGNOSIS — L97529 Non-pressure chronic ulcer of other part of left foot with unspecified severity: Secondary | ICD-10-CM

## 2020-05-17 DIAGNOSIS — F17200 Nicotine dependence, unspecified, uncomplicated: Secondary | ICD-10-CM

## 2020-05-17 DIAGNOSIS — I10 Essential (primary) hypertension: Secondary | ICD-10-CM

## 2020-05-17 LAB — COMPREHENSIVE METABOLIC PANEL
ALT: 15 U/L (ref 0–44)
AST: 13 U/L — ABNORMAL LOW (ref 15–41)
Albumin: 3.5 g/dL (ref 3.5–5.0)
Alkaline Phosphatase: 57 U/L (ref 38–126)
Anion gap: 9 (ref 5–15)
BUN: 9 mg/dL (ref 6–20)
CO2: 25 mmol/L (ref 22–32)
Calcium: 9.2 mg/dL (ref 8.9–10.3)
Chloride: 103 mmol/L (ref 98–111)
Creatinine, Ser: 0.78 mg/dL (ref 0.44–1.00)
GFR calc Af Amer: >60 mL/min (ref >60)
GFR calc non Af Amer: >60 mL/min (ref >60)
Glucose, Bld: 81 mg/dL (ref 70–99)
Potassium: 3.7 mmol/L (ref 3.5–5.1)
Sodium: 137 mmol/L (ref 135–145)
Total Bilirubin: 0.7 mg/dL (ref 0.3–1.2)
Total Protein: 7.5 g/dL (ref 6.5–8.1)

## 2020-05-17 LAB — CBC
HCT: 36 % (ref 36.0–46.0)
Hemoglobin: 10.4 g/dL — ABNORMAL LOW (ref 12.0–15.0)
MCH: 23.6 pg — ABNORMAL LOW (ref 26.0–34.0)
MCHC: 28.9 g/dL — ABNORMAL LOW (ref 30.0–36.0)
MCV: 81.8 fL (ref 80.0–100.0)
Platelets: 347 10*3/uL (ref 150–400)
RBC: 4.4 MIL/uL (ref 3.87–5.11)
RDW: 19.4 % — ABNORMAL HIGH (ref 11.5–15.5)
WBC: 16.8 10*3/uL — ABNORMAL HIGH (ref 4.0–10.5)
nRBC: 0 % (ref 0.0–0.2)

## 2020-05-17 LAB — CORTISOL-AM, BLOOD: Cortisol - AM: 1.2 ug/dL — ABNORMAL LOW (ref 6.7–22.6)

## 2020-05-17 LAB — PROTIME-INR
INR: 1 (ref 0.8–1.2)
Prothrombin Time: 12.9 seconds (ref 11.4–15.2)

## 2020-05-17 LAB — PROCALCITONIN: Procalcitonin: <0.1 ng/mL

## 2020-05-17 MED ORDER — HYDROMORPHONE HCL 1 MG/ML IJ SOLN
0.5000 mg | INTRAMUSCULAR | Status: DC | PRN
  Administered 2020-05-17 – 2020-05-24 (×18): 0.5 mg via INTRAVENOUS
  Filled 2020-05-17 (×20): qty 1

## 2020-05-17 MED ORDER — GABAPENTIN 300 MG PO CAPS
300.0000 mg | ORAL_CAPSULE | Freq: Three times a day (TID) | ORAL | Status: DC
  Administered 2020-05-17 – 2020-05-24 (×22): 300 mg via ORAL
  Filled 2020-05-17 (×23): qty 1

## 2020-05-17 MED ORDER — HYDROMORPHONE HCL 1 MG/ML IJ SOLN
1.0000 mg | Freq: Once | INTRAMUSCULAR | Status: AC
  Administered 2020-05-17: 1 mg via INTRAVENOUS
  Filled 2020-05-17: qty 1

## 2020-05-17 MED ORDER — NICOTINE 7 MG/24HR TD PT24
7.0000 mg | MEDICATED_PATCH | Freq: Every day | TRANSDERMAL | Status: DC
  Administered 2020-05-17 – 2020-05-24 (×8): 7 mg via TRANSDERMAL
  Filled 2020-05-17 (×10): qty 1

## 2020-05-17 MED ORDER — VANCOMYCIN HCL IN DEXTROSE 1-5 GM/200ML-% IV SOLN
1000.0000 mg | Freq: Two times a day (BID) | INTRAVENOUS | Status: DC
  Administered 2020-05-17 – 2020-05-21 (×9): 1000 mg via INTRAVENOUS
  Filled 2020-05-17 (×12): qty 200

## 2020-05-17 NOTE — Progress Notes (Signed)
PROGRESS NOTE    Regina Carpenter  OEV:[id number] DOB: 1976/01/02 DOA: 05/16/2020 PCP: Emmaline Kluver., MD   Brief Narrative:  HPI: Regina Carpenter is a 44 y.o. female with medical history significant of rheumatoid arthritis, polyarteritis vasculitis, chronic bilateral foot wounds who presents to the emergency department for concern of worsening wound of the bilateral feet.  The patient states that she has known severe vasculopathy and over the past several months her lesions started as painful ulcers on multiple toes that is now progressed into a gangrenous state.  The wounds have become increasingly painful and had some foul-smelling drainage from the area as well.  The patient was at the wound care center today and on presentation there the patient was sent here for further evaluation  Patient denies any fevers or chills denies any weight loss, denies any other systemic symptoms.  Her only complaints are pain in bilateral lower extremities.  On review of her rheumatology notes from Dr. Jefm Bryant she has known systemic vasculitis and no ischemic disease of the digits on the right leg.  She also has a known large 3 to 4 cm ulcer on the right lateral leg.  She states these ulcers have been worsening in severity and pain.  On my evaluation in the ED patient is resting comfortably in bed.  She is in minimal distress.  On interview it appears that patient was previously on disease modifying agents such as methotrexate however has been taken off by her rheumatologist.  She is currently just on prednisone.  8/14: Patient seen and examined.  Some issues with pain control overnight.  Control achieved with Dilaudid.  Seen by podiatry and vascular surgery.  Plan for angiogram next week.   Assessment & Plan:   Active Problems:   Sepsis ([place])  Wound infection in the setting of a systemic vasculitis Sepsis secondary to above Lactic acidosis secondary to above Patient meets sepsis criteria with  leukocytosis of 19.8, tachycardia, elevated lactic acid Presumptive source is wound infection Vascular surgery contacted from ED Podiatry contacted from ED Patient started on broad-spectrum antibiotics in the emergency department IV fluids given Plan: Continue IVF, 75 cc/h Continue IV vancomycin, pharmacy dosing Continue Rocephin 2 g every 24 hours Continue Flagyl 500 mg IV every 8 hours Follow blood cultures, no growth to date Titrate antibiotics as appropriate Vascular consult, plan for angiogram next week Podiatry consult, surgical plan pending angiography Pain control as needed Patient is known to Dr. Jefm Bryant from rheumatology.  We do not have rheumatology coverage at [facility] over the weekend.    Rheumatoid arthritis Systemic vasculitis Patient known to Dr. Jefm Bryant Recently taken off methotrexate and switched to Cytoxan Plan: Continue Cytoxan 50 mg daily Continue prednisone 5 mg daily  Hypertension Amlodipine 2.5 mg daily per home dose   DVT prophylaxis: Lovenox Code Status: Full Family Communication: None today Disposition Plan: Status is: Inpatient  Remains inpatient appropriate because:Inpatient level of care appropriate due to severity of illness   Dispo: The patient is from: Home              Anticipated d/c is to: Home              Anticipated d/c date is: > 3 days              Patient currently is not medically stable to d/c.          Consultants:   Vascular surgery  Podiatry  Procedures:   None  Antimicrobials:  Vancomycin  Rocephin  Flagyl   Subjective: Patient seen and examined.  Endorsing pain in the right foot.  Objective: Vitals:   05/16/20 2030 05/17/20 0024 05/17/20 0435 05/17/20 0804  BP: (!) 144/105 (!) 141/107 124/90 (!) 137/93  Pulse: (!) 105 (!) 106 (!) 105 78  Resp: 16 18 18    Temp: 98.3 F (36.8 C) 98.1 F (36.7 C) 98.2 F (36.8 C) 98.9 F (37.2 C)  TempSrc: Oral Oral Oral Oral  SpO2: 100% 98% 97% 100%    Weight: 83.9 kg     Height: 5\' 5"  (1.651 m)       Intake/Output Summary (Last 24 hours) at 05/17/2020 1259 Last data filed at 05/17/2020 1004 Gross per 24 hour  Intake 240 ml  Output 501 ml  Net -261 ml   Filed Weights   05/16/20 1139 05/16/20 2030  Weight: 83 kg 83.9 kg    Examination:  General exam: Appears calm and comfortable  Respiratory system: Clear to auscultation. Respiratory effort normal. Cardiovascular system: S1 & S2 heard, RRR. No JVD, murmurs, rubs, gallops or clicks. No pedal edema. Gastrointestinal system: Abdomen is nondistended, soft and nontender. No organomegaly or masses felt. Normal bowel sounds heard. Central nervous system: Alert and oriented. No focal neurological deficits. Extremities: Right second toe ischemic.  30 palpable pulses.  No significant edema.  4 cm ulcer on right lateral leg.  1 cm lesion on right fourth toe.  1 cm raised lesion on left second toe. Skin: Scattered excoriations.  No induration Psychiatry: Judgement and insight appear normal. Mood & affect appropriate.     Data Reviewed: I have personally reviewed following labs and imaging studies  CBC: Recent Labs  Lab 05/16/20 1141 05/17/20 0556  WBC 19.8* 16.8*  NEUTROABS 17.2*  --   HGB 11.7* 10.4*  HCT 37.3 36.0  MCV 79.0* 81.8  PLT 403* 588   Basic Metabolic Panel: Recent Labs  Lab 05/16/20 1141 05/17/20 0556  NA 135 137  K 4.2 3.7  CL 100 103  CO2 25 25  GLUCOSE 177* 81  BUN 9 9  CREATININE 0.73 0.78  CALCIUM 9.6 9.2   GFR: Estimated Creatinine Clearance: 96.1 mL/min (by C-G formula based on SCr of 0.78 mg/dL). Liver Function Tests: Recent Labs  Lab 05/16/20 1141 05/17/20 0556  AST 17 13*  ALT 18 15  ALKPHOS 64 57  BILITOT 0.6 0.7  PROT 7.8 7.5  ALBUMIN 3.6 3.5   No results for input(s): LIPASE, AMYLASE in the last 168 hours. No results for input(s): AMMONIA in the last 168 hours. Coagulation Profile: Recent Labs  Lab 05/17/20 0556  INR 1.0    Cardiac Enzymes: No results for input(s): CKTOTAL, CKMB, CKMBINDEX, TROPONINI in the last 168 hours. BNP (last 3 results) No results for input(s): PROBNP in the last 8760 hours. HbA1C: No results for input(s): HGBA1C in the last 72 hours. CBG: No results for input(s): GLUCAP in the last 168 hours. Lipid Profile: No results for input(s): CHOL, HDL, LDLCALC, TRIG, CHOLHDL, LDLDIRECT in the last 72 hours. Thyroid Function Tests: No results for input(s): TSH, T4TOTAL, FREET4, T3FREE, THYROIDAB in the last 72 hours. Anemia Panel: No results for input(s): VITAMINB12, FOLATE, FERRITIN, TIBC, IRON, RETICCTPCT in the last 72 hours. Sepsis Labs: Recent Labs  Lab 05/16/20 1141 05/16/20 1544 05/17/20 0556  PROCALCITON  --   --  <0.10  LATICACIDVEN 2.2* 1.2  --     Recent Results (from the past 240 hour(s))  Blood culture (routine  x 2)     Status: None (Preliminary result)   Collection Time: 05/16/20  3:49 PM   Specimen: BLOOD  Result Value Ref Range Status   Specimen Description BLOOD LEFT ANTECUBITAL  Final   Special Requests   Final    BOTTLES DRAWN AEROBIC AND ANAEROBIC Blood Culture results may not be optimal due to an inadequate volume of blood received in culture bottles   Culture   Final    NO GROWTH < 24 HOURS Performed at [facility], [address] [place], [place] [address]    Report Status PENDING  Incomplete  Blood culture (routine x 2)     Status: None (Preliminary result)   Collection Time: 05/16/20  3:54 PM   Specimen: BLOOD  Result Value Ref Range Status   Specimen Description BLOOD BLOOD RIGHT WRIST  Final   Special Requests   Final    BOTTLES DRAWN AEROBIC AND ANAEROBIC Blood Culture adequate volume   Culture   Final    NO GROWTH < 24 HOURS Performed at [facility], [address] [place], [place] [address]    Report Status PENDING  Incomplete  SARS Coronavirus 2 by RT PCR (hospital order, performed in [place] hospital lab)  Nasopharyngeal Nasopharyngeal Swab     Status: None   Collection Time: 05/16/20  5:46 PM   Specimen: Nasopharyngeal Swab  Result Value Ref Range Status   SARS Coronavirus 2 NEGATIVE NEGATIVE Final    Comment: (NOTE) SARS-CoV-2 target nucleic acids are NOT DETECTED.  The SARS-CoV-2 RNA is generally detectable in upper and lower respiratory specimens during the acute phase of infection. The lowest concentration of SARS-CoV-2 viral copies this assay can detect is 250 copies / mL. A negative result does not preclude SARS-CoV-2 infection and should not be used as the sole basis for treatment or other patient management decisions.  A negative result may occur with improper specimen collection / handling, submission of specimen other than nasopharyngeal swab, presence of viral mutation(s) within the areas targeted by this assay, and inadequate number of viral copies (<250 copies / mL). A negative result must be combined with clinical observations, patient history, and epidemiological information.  Fact Sheet for Patients:   StrictlyIdeas.no  Fact Sheet for Healthcare Providers: BankingDealers.co.za  This test is not yet approved or  cleared by the [place] FDA and has been authorized for detection and/or diagnosis of SARS-CoV-2 by FDA under an Emergency Use Authorization (EUA).  This EUA will remain in effect (meaning this test can be used) for the duration of the COVID-19 declaration under Section 564(b)(1) of the Act, 21 U.S.C. section 360bbb-3(b)(1), unless the authorization is terminated or revoked sooner.  Performed at [facility], [address] [place], [place] [address]   Culture, blood (single)     Status: None (Preliminary result)   Collection Time: 05/16/20  8:52 PM   Specimen: BLOOD  Result Value Ref Range Status   Specimen Description BLOOD RIGHT ANTECUBITAL  Final   Special Requests   Final    BOTTLES  DRAWN AEROBIC AND ANAEROBIC Blood Culture adequate volume   Culture   Final    NO GROWTH < 12 HOURS Performed at [facility], [address], [place], [place] [address]    Report Status PENDING  Incomplete         Radiology Studies: DG Foot 2 Views Right  Result Date: 05/16/2020 CLINICAL DATA:  Necrosis of the second and fourth digit on the  RIGHT foot. Necrosis of the fifth digit on the LEFT foot. EXAM: RIGHT FOOT - 2 VIEW COMPARISON:  None. FINDINGS: No acute fracture or dislocation. Joint spaces and alignment are maintained. No area of erosion or osseous destruction on these limited views. No unexpected radiopaque foreign body. There is lucency projecting over the second and fourth toes. This may reflect subcutaneous air versus air trapped underneath overlying bandage. Soft tissue edema. IMPRESSION: 1. No definitive radiographic evidence of osteomyelitis. 2. Lucency projecting over the second and fourth toes may reflect subcutaneous air and/or air trapped underneath overlying bandage. Recommend correlation with physical exam. Electronically Signed   By: Valentino Saxon MD   On: 05/16/2020 13:09   DG Toe 5th Left  Result Date: 05/16/2020 CLINICAL DATA:  Necrosis of the fifth toe EXAM: DG TOE 5TH LEFT COMPARISON:  None. FINDINGS: Evaluation for fine osseous detail is limited secondary to overlying bandage. No acute fracture or dislocation. Joint spaces and alignment are maintained. No area of erosion or osseous destruction. No unexpected radiopaque foreign body. Soft tissues are unremarkable. IMPRESSION: No definitive radiographic evidence of osteomyelitis. Electronically Signed   By: Valentino Saxon MD   On: 05/16/2020 13:10        Scheduled Meds: . amLODipine  5 mg Oral Daily  . cyclophosphamide  50 mg Oral Daily  . enoxaparin (LOVENOX) injection  40 mg Subcutaneous Q24H  . folic acid  1 mg Oral Daily  . gabapentin  300 mg Oral TID  . nicotine  7 mg Transdermal  Daily  . predniSONE  5 mg Oral Q breakfast   Continuous Infusions: . sodium chloride 75 mL/hr at 05/17/20 0112  . cefTRIAXone (ROCEPHIN)  IV Stopped (05/17/20 0030)  . lactated ringers     And  . lactated ringers    . lactated ringers    . metronidazole 500 mg (05/17/20 0537)  . vancomycin       LOS: 1 day    Time spent: 25 minutes    Sidney Ace, MD [facility] Pager [phone]  If 7PM-7AM, please contact night-coverage 05/17/2020, 12:59 PM

## 2020-05-17 NOTE — Plan of Care (Signed)
Will follow up with patient during shift

## 2020-05-17 NOTE — Consult Note (Signed)
Pharmacy Antibiotic Note  Regina Carpenter is a 44 y.o. female admitted on 05/16/2020 with cellulitis.  Pharmacy has been consulted for Vancomycin dosing.  Plan: Continue vancomycin 1000 mg IV q12h  Height: 5\' 5"  (165.1 cm) Weight: 83.9 kg (185 lb) IBW/kg (Calculated) : 57  Temp (24hrs), Avg:98.5 F (36.9 C), Min:98.1 F (36.7 C), Max:98.9 F (37.2 C)  Recent Labs  Lab 05/16/20 1141 05/16/20 1544 05/17/20 0556  WBC 19.8*  --  16.8*  CREATININE 0.73  --  0.78  LATICACIDVEN 2.2* 1.2  --     Estimated Creatinine Clearance: 96.1 mL/min (by C-G formula based on SCr of 0.78 mg/dL).    Allergies  Allergen Reactions  . Ibuprofen Other (See Comments)    Pt states that she sweats profusely after taking ibuprofen Other reaction(s): Other (See Comments) "sweating:"  . Sulfasalazine Rash    Antimicrobials this admission: 8/13 Zosyn x 1 Vancomycin 8/13 >>  Dose adjustments this admission: None  Microbiology results: 8/13 BCx: NG COVID negative  Thank you for allowing pharmacy to be a part of this patient's care.  Tawnya Crook, PharmD Clinical Pharmacist 05/17/2020 10:57 AM

## 2020-05-17 NOTE — H&P (View-Only) (Signed)
[facility] Vascular Consult Note  MRN : [id number]  Regina Carpenter is a 44 y.o. ([birth year]) female who presents with chief complaint of  Chief Complaint  Patient presents with  . Wound Infection  .  History of Present Illness: Patient with significant smoking history > 20 years, rheumatoid arthritis, polyarteritis vasculitis, chronic bilateral foot wounds who presents to the emergency department for concern of worsening wound of the bilateral feet. She states her feet became painful in May with progressive gangrene of toes bilaterally and lower extremity wounds. She denies claudication or rest pain. She is followed by Rheumatology for her Vasculitis. She was evaluated by Vascular Surgery at [facility], no intervention was recommended.  Current Facility-Administered Medications  Medication Dose Route Frequency Provider Last Rate Last Admin  . 0.9 %  sodium chloride infusion   Intravenous Continuous Ralene Muskrat B, MD 75 mL/hr at 05/17/20 0112 New Bag at 05/17/20 0112  . acetaminophen (TYLENOL) tablet 650 mg  650 mg Oral Q6H PRN Ralene Muskrat B, MD       Or  . acetaminophen (TYLENOL) suppository 650 mg  650 mg Rectal Q6H PRN Sreenath, Sudheer B, MD      . albuterol (PROVENTIL) (2.5 MG/3ML) 0.083% nebulizer solution 2.5 mg  2.5 mg Nebulization Q2H PRN Sreenath, Sudheer B, MD      . amLODipine (NORVASC) tablet 5 mg  5 mg Oral Daily Ralene Muskrat B, MD   5 mg at 05/17/20 0948  . cefTRIAXone (ROCEPHIN) 2 g in sodium chloride 0.9 % 100 mL IVPB  2 g Intravenous Q24H Sidney Ace, MD   Stopped at 05/17/20 0030  . cyclophosphamide (CYTOXAN) capsule 50 mg  50 mg Oral Daily Sreenath, Sudheer B, MD      . enoxaparin (LOVENOX) injection 40 mg  40 mg Subcutaneous Q24H Ralene Muskrat B, MD   40 mg at 05/16/20 2353  . folic acid (FOLVITE) tablet 1 mg  1 mg Oral Daily Sreenath, Sudheer B, MD   1 mg at 05/17/20 0948  . gabapentin (NEURONTIN) capsule 300 mg  300 mg  Oral TID Ralene Muskrat B, MD   300 mg at 05/17/20 1126  . HYDROmorphone (DILAUDID) injection 0.5 mg  0.5 mg Intravenous Q3H PRN Ralene Muskrat B, MD   0.5 mg at 05/17/20 1124  . lactated ringers bolus 1,000 mL  1,000 mL Intravenous Once Sreenath, Sudheer B, MD       And  . lactated ringers bolus 1,000 mL  1,000 mL Intravenous Once Sreenath, Sudheer B, MD      . lactated ringers infusion   Intravenous Continuous Duffy Bruce, MD      . metroNIDAZOLE (FLAGYL) IVPB 500 mg  500 mg Intravenous Q8H Sreenath, Sudheer B, MD 100 mL/hr at 05/17/20 0537 500 mg at 05/17/20 0537  . nicotine (NICODERM CQ - dosed in mg/24 hr) patch 7 mg  7 mg Transdermal Daily Katharin Schneider A, MD      . ondansetron (ZOFRAN) tablet 4 mg  4 mg Oral Q6H PRN Sreenath, Sudheer B, MD       Or  . ondansetron (ZOFRAN) injection 4 mg  4 mg Intravenous Q6H PRN Sreenath, Sudheer B, MD      . oxyCODONE (Oxy IR/ROXICODONE) immediate release tablet 5 mg  5 mg Oral Q4H PRN Ralene Muskrat B, MD   5 mg at 05/17/20 0954  . predniSONE (DELTASONE) tablet 5 mg  5 mg Oral Q breakfast Sidney Ace, MD   5  mg at 05/17/20 0948  . senna-docusate (Senokot-S) tablet 1 tablet  1 tablet Oral QHS PRN Ralene Muskrat B, MD      . traZODone (DESYREL) tablet 25 mg  25 mg Oral QHS PRN Sreenath, Sudheer B, MD      . vancomycin (VANCOCIN) IVPB 1000 mg/200 mL premix  1,000 mg Intravenous Q12H Sidney Ace, MD        Past Medical History:  Diagnosis Date  . Arthralgia of right knee   . Arthritis   . Cellulitis of left leg   . Collagen vascular disease ([place])   . Lymphadenopathy, mesenteric   . Right hand pain   . Tobacco abuse     Past Surgical History:  Procedure Laterality Date  . denies      Social History Social History   Tobacco Use  . Smoking status: Current Every Day Smoker    Packs/day: 0.50    Types: Cigarettes  . Smokeless tobacco: Never Used  Vaping Use  . Vaping Use: Never used  Substance Use Topics  .  Alcohol use: No  . Drug use: No    Family History Family History  Problem Relation Age of Onset  . Leukemia Maternal Grandfather   . Alcohol abuse Father     Allergies  Allergen Reactions  . Ibuprofen Other (See Comments)    Pt states that she sweats profusely after taking ibuprofen Other reaction(s): Other (See Comments) "sweating:"  . Sulfasalazine Rash     REVIEW OF SYSTEMS (Negative unless checked)  Constitutional: [] Weight loss  [] Fever  [] Chills Cardiac: [] Chest pain   [] Chest pressure   [] Palpitations   [] Shortness of breath when laying flat   [] Shortness of breath at rest   [] Shortness of breath with exertion. Vascular:  [] Pain in legs with walking   [] Pain in legs at rest   [] Pain in legs when laying flat   [] Claudication   [x] Pain in feet when walking  [x] Pain in feet at rest  [] Pain in feet when laying flat   [] History of DVT   [] Phlebitis   [] Swelling in legs   [] Varicose veins   [x] Non-healing ulcers Pulmonary:   [] Uses home oxygen   [] Productive cough   [] Hemoptysis   [] Wheeze  [] COPD   [] Asthma Neurologic:  [] Dizziness  [] Blackouts   [] Seizures   [] History of stroke   [] History of TIA  [] Aphasia   [] Temporary blindness   [] Dysphagia   [] Weakness or numbness in arms   [] Weakness or numbness in legs Musculoskeletal:  [] Arthritis   [] Joint swelling   [] Joint pain   [] Low back pain Hematologic:  [] Easy bruising  [] Easy bleeding   [] Hypercoagulable state   [] Anemic  [] Hepatitis Gastrointestinal:  [] Blood in stool   [] Vomiting blood  [] Gastroesophageal reflux/heartburn   [] Difficulty swallowing. Genitourinary:  [] Chronic kidney disease   [] Difficult urination  [] Frequent urination  [] Burning with urination   [] Blood in urine Skin:  [] Rashes   [x] Ulcers   [x] Wounds Psychological:  [] History of anxiety   []  History of major depression.  Physical Examination  Vitals:   05/16/20 2030 05/17/20 0024 05/17/20 0435 05/17/20 0804  BP: (!) 144/105 (!) 141/107 124/90 (!) 137/93   Pulse: (!) 105 (!) 106 (!) 105 78  Resp: 16 18 18    Temp: 98.3 F (36.8 C) 98.1 F (36.7 C) 98.2 F (36.8 C) 98.9 F (37.2 C)  TempSrc: Oral Oral Oral Oral  SpO2: 100% 98% 97% 100%  Weight: 83.9 kg     Height: 5'  5" (1.651 m)      Body mass index is 30.79 kg/m. Gen:  WD/WN, NAD Head: [place]/AT, No temporalis wasting. Prominent temp pulse not noted. Neck: Trachea midline.  No JVD.  Pulmonary:  Good air movement, respirations not labored, equal bilaterally.  Cardiac: RRR, normal S1, S2. Vascular:  Vessel Right Left  Radial Palpable Palpable  Ulnar Palpable Palpable  Brachial Palpable Palpable  Carotid Palpable, without bruit Palpable, without bruit  Aorta Not palpable N/A  Femoral Palpable Palpable  Popliteal    PT    DP Palpable Palpable   Gastrointestinal: soft, non-tender/non-distended. No guarding/reflex.  Musculoskeletal: Dry Gangrene of toes bilaterally; Dry ulcerations bilateral lower extremities, no erythema Neurologic: Sensation grossly intact in extremities.  Symmetrical.  Speech is fluent. Motor exam as listed above. Psychiatric: Judgment intact, Mood & affect appropriate for pt's clinical situation. Dermatologic: No rashes or ulcers noted.  No cellulitis or open wounds. Lymph : No Cervical, Axillary, or Inguinal lymphadenopathy.      CBC Lab Results  Component Value Date   WBC 16.8 (H) 05/17/2020   HGB 10.4 (L) 05/17/2020   HCT 36.0 05/17/2020   MCV 81.8 05/17/2020   PLT 347 05/17/2020    BMET    Component Value Date/Time   NA 137 05/17/2020 0556   K 3.7 05/17/2020 0556   CL 103 05/17/2020 0556   CO2 25 05/17/2020 0556   GLUCOSE 81 05/17/2020 0556   BUN 9 05/17/2020 0556   CREATININE 0.78 05/17/2020 0556   CALCIUM 9.2 05/17/2020 0556   GFRNONAA >60 05/17/2020 0556   GFRAA >60 05/17/2020 0556   Estimated Creatinine Clearance: 96.1 mL/min (by C-G formula based on SCr of 0.78 mg/dL).  COAG Lab Results  Component Value Date   INR 1.0  05/17/2020    Radiology DG Foot 2 Views Right  Result Date: 05/16/2020 CLINICAL DATA:  Necrosis of the second and fourth digit on the RIGHT foot. Necrosis of the fifth digit on the LEFT foot. EXAM: RIGHT FOOT - 2 VIEW COMPARISON:  None. FINDINGS: No acute fracture or dislocation. Joint spaces and alignment are maintained. No area of erosion or osseous destruction on these limited views. No unexpected radiopaque foreign body. There is lucency projecting over the second and fourth toes. This may reflect subcutaneous air versus air trapped underneath overlying bandage. Soft tissue edema. IMPRESSION: 1. No definitive radiographic evidence of osteomyelitis. 2. Lucency projecting over the second and fourth toes may reflect subcutaneous air and/or air trapped underneath overlying bandage. Recommend correlation with physical exam. Electronically Signed   By: Valentino Saxon MD   On: 05/16/2020 13:09   DG Toe 5th Left  Result Date: 05/16/2020 CLINICAL DATA:  Necrosis of the fifth toe EXAM: DG TOE 5TH LEFT COMPARISON:  None. FINDINGS: Evaluation for fine osseous detail is limited secondary to overlying bandage. No acute fracture or dislocation. Joint spaces and alignment are maintained. No area of erosion or osseous destruction. No unexpected radiopaque foreign body. Soft tissues are unremarkable. IMPRESSION: No definitive radiographic evidence of osteomyelitis. Electronically Signed   By: Valentino Saxon MD   On: 05/16/2020 13:10      Assessment/Plan 1. Small Vessel Vasculitis 2. No evidence of Large Vessel or tibial pathology on CTA from 5/21 and palpable DP pulses bilaterally 3. However, the patient has not had an angiogram or further diagnostic studies since her gangrene/toes and leg wounds have worsened. 4. Will plan for diagnostic bilateral lower extremity angiogram on Monday per Dr. Lucky Cowboy, however, unlikely have  pathology that will be amenable revascularization as it appears to be small vessel  Vasculitis 5. Smoking cessation 6. Consult Rheumatology 7. Consult Podiatry for toe gangrene.   Evaristo Bury, MD  05/17/2020 12:37 PM    This note was created with Dragon medical transcription system.  Any error is purely unintentional

## 2020-05-17 NOTE — Consult Note (Signed)
[facility] Vascular Consult Note  MRN : [id number]  Regina Carpenter is a 44 y.o. (March 15, 1976) female who presents with chief complaint of  Chief Complaint  Patient presents with  . Wound Infection  .  History of Present Illness: Patient with significant smoking history > 20 years, rheumatoid arthritis, polyarteritis vasculitis, chronic bilateral foot wounds who presents to the emergency department for concern of worsening wound of the bilateral feet. She states her feet became painful in May with progressive gangrene of toes bilaterally and lower extremity wounds. She denies claudication or rest pain. She is followed by Rheumatology for her Vasculitis. She was evaluated by Vascular Surgery at [facility], no intervention was recommended.  Current Facility-Administered Medications  Medication Dose Route Frequency Provider Last Rate Last Admin  . 0.9 %  sodium chloride infusion   Intravenous Continuous Ralene Muskrat B, MD 75 mL/hr at 05/17/20 0112 New Bag at 05/17/20 0112  . acetaminophen (TYLENOL) tablet 650 mg  650 mg Oral Q6H PRN Ralene Muskrat B, MD       Or  . acetaminophen (TYLENOL) suppository 650 mg  650 mg Rectal Q6H PRN Sreenath, Sudheer B, MD      . albuterol (PROVENTIL) (2.5 MG/3ML) 0.083% nebulizer solution 2.5 mg  2.5 mg Nebulization Q2H PRN Sreenath, Sudheer B, MD      . amLODipine (NORVASC) tablet 5 mg  5 mg Oral Daily Ralene Muskrat B, MD   5 mg at 05/17/20 0948  . cefTRIAXone (ROCEPHIN) 2 g in sodium chloride 0.9 % 100 mL IVPB  2 g Intravenous Q24H Sidney Ace, MD   Stopped at 05/17/20 0030  . cyclophosphamide (CYTOXAN) capsule 50 mg  50 mg Oral Daily Sreenath, Sudheer B, MD      . enoxaparin (LOVENOX) injection 40 mg  40 mg Subcutaneous Q24H Ralene Muskrat B, MD   40 mg at 05/16/20 2353  . folic acid (FOLVITE) tablet 1 mg  1 mg Oral Daily Sreenath, Sudheer B, MD   1 mg at 05/17/20 0948  . gabapentin (NEURONTIN) capsule 300 mg  300 mg  Oral TID Ralene Muskrat B, MD   300 mg at 05/17/20 1126  . HYDROmorphone (DILAUDID) injection 0.5 mg  0.5 mg Intravenous Q3H PRN Ralene Muskrat B, MD   0.5 mg at 05/17/20 1124  . lactated ringers bolus 1,000 mL  1,000 mL Intravenous Once Sreenath, Sudheer B, MD       And  . lactated ringers bolus 1,000 mL  1,000 mL Intravenous Once Sreenath, Sudheer B, MD      . lactated ringers infusion   Intravenous Continuous Duffy Bruce, MD      . metroNIDAZOLE (FLAGYL) IVPB 500 mg  500 mg Intravenous Q8H Sreenath, Sudheer B, MD 100 mL/hr at 05/17/20 0537 500 mg at 05/17/20 0537  . nicotine (NICODERM CQ - dosed in mg/24 hr) patch 7 mg  7 mg Transdermal Daily Clyde Zarrella A, MD      . ondansetron (ZOFRAN) tablet 4 mg  4 mg Oral Q6H PRN Sreenath, Sudheer B, MD       Or  . ondansetron (ZOFRAN) injection 4 mg  4 mg Intravenous Q6H PRN Sreenath, Sudheer B, MD      . oxyCODONE (Oxy IR/ROXICODONE) immediate release tablet 5 mg  5 mg Oral Q4H PRN Ralene Muskrat B, MD   5 mg at 05/17/20 0954  . predniSONE (DELTASONE) tablet 5 mg  5 mg Oral Q breakfast Sidney Ace, MD   5  mg at 05/17/20 0948  . senna-docusate (Senokot-S) tablet 1 tablet  1 tablet Oral QHS PRN Ralene Muskrat B, MD      . traZODone (DESYREL) tablet 25 mg  25 mg Oral QHS PRN Sreenath, Sudheer B, MD      . vancomycin (VANCOCIN) IVPB 1000 mg/200 mL premix  1,000 mg Intravenous Q12H Sidney Ace, MD        Past Medical History:  Diagnosis Date  . Arthralgia of right knee   . Arthritis   . Cellulitis of left leg   . Collagen vascular disease ([place])   . Lymphadenopathy, mesenteric   . Right hand pain   . Tobacco abuse     Past Surgical History:  Procedure Laterality Date  . denies      Social History Social History   Tobacco Use  . Smoking status: Current Every Day Smoker    Packs/day: 0.50    Types: Cigarettes  . Smokeless tobacco: Never Used  Vaping Use  . Vaping Use: Never used  Substance Use Topics  .  Alcohol use: No  . Drug use: No    Family History Family History  Problem Relation Age of Onset  . Leukemia Maternal Grandfather   . Alcohol abuse Father     Allergies  Allergen Reactions  . Ibuprofen Other (See Comments)    Pt states that she sweats profusely after taking ibuprofen Other reaction(s): Other (See Comments) "sweating:"  . Sulfasalazine Rash     REVIEW OF SYSTEMS (Negative unless checked)  Constitutional: [] Weight loss  [] Fever  [] Chills Cardiac: [] Chest pain   [] Chest pressure   [] Palpitations   [] Shortness of breath when laying flat   [] Shortness of breath at rest   [] Shortness of breath with exertion. Vascular:  [] Pain in legs with walking   [] Pain in legs at rest   [] Pain in legs when laying flat   [] Claudication   [x] Pain in feet when walking  [x] Pain in feet at rest  [] Pain in feet when laying flat   [] History of DVT   [] Phlebitis   [] Swelling in legs   [] Varicose veins   [x] Non-healing ulcers Pulmonary:   [] Uses home oxygen   [] Productive cough   [] Hemoptysis   [] Wheeze  [] COPD   [] Asthma Neurologic:  [] Dizziness  [] Blackouts   [] Seizures   [] History of stroke   [] History of TIA  [] Aphasia   [] Temporary blindness   [] Dysphagia   [] Weakness or numbness in arms   [] Weakness or numbness in legs Musculoskeletal:  [] Arthritis   [] Joint swelling   [] Joint pain   [] Low back pain Hematologic:  [] Easy bruising  [] Easy bleeding   [] Hypercoagulable state   [] Anemic  [] Hepatitis Gastrointestinal:  [] Blood in stool   [] Vomiting blood  [] Gastroesophageal reflux/heartburn   [] Difficulty swallowing. Genitourinary:  [] Chronic kidney disease   [] Difficult urination  [] Frequent urination  [] Burning with urination   [] Blood in urine Skin:  [] Rashes   [x] Ulcers   [x] Wounds Psychological:  [] History of anxiety   []  History of major depression.  Physical Examination  Vitals:   05/16/20 2030 05/17/20 0024 05/17/20 0435 05/17/20 0804  BP: (!) 144/105 (!) 141/107 124/90 (!) 137/93   Pulse: (!) 105 (!) 106 (!) 105 78  Resp: 16 18 18    Temp: 98.3 F (36.8 C) 98.1 F (36.7 C) 98.2 F (36.8 C) 98.9 F (37.2 C)  TempSrc: Oral Oral Oral Oral  SpO2: 100% 98% 97% 100%  Weight: 83.9 kg     Height: 5'  5" (1.651 m)      Body mass index is 30.79 kg/m. Gen:  WD/WN, NAD Head: [place]/AT, No temporalis wasting. Prominent temp pulse not noted. Neck: Trachea midline.  No JVD.  Pulmonary:  Good air movement, respirations not labored, equal bilaterally.  Cardiac: RRR, normal S1, S2. Vascular:  Vessel Right Left  Radial Palpable Palpable  Ulnar Palpable Palpable  Brachial Palpable Palpable  Carotid Palpable, without bruit Palpable, without bruit  Aorta Not palpable N/A  Femoral Palpable Palpable  Popliteal    PT    DP Palpable Palpable   Gastrointestinal: soft, non-tender/non-distended. No guarding/reflex.  Musculoskeletal: Dry Gangrene of toes bilaterally; Dry ulcerations bilateral lower extremities, no erythema Neurologic: Sensation grossly intact in extremities.  Symmetrical.  Speech is fluent. Motor exam as listed above. Psychiatric: Judgment intact, Mood & affect appropriate for pt's clinical situation. Dermatologic: No rashes or ulcers noted.  No cellulitis or open wounds. Lymph : No Cervical, Axillary, or Inguinal lymphadenopathy.      CBC Lab Results  Component Value Date   WBC 16.8 (H) 05/17/2020   HGB 10.4 (L) 05/17/2020   HCT 36.0 05/17/2020   MCV 81.8 05/17/2020   PLT 347 05/17/2020    BMET    Component Value Date/Time   NA 137 05/17/2020 0556   K 3.7 05/17/2020 0556   CL 103 05/17/2020 0556   CO2 25 05/17/2020 0556   GLUCOSE 81 05/17/2020 0556   BUN 9 05/17/2020 0556   CREATININE 0.78 05/17/2020 0556   CALCIUM 9.2 05/17/2020 0556   GFRNONAA >60 05/17/2020 0556   GFRAA >60 05/17/2020 0556   Estimated Creatinine Clearance: 96.1 mL/min (by C-G formula based on SCr of 0.78 mg/dL).  COAG Lab Results  Component Value Date   INR 1.0  05/17/2020    Radiology DG Foot 2 Views Right  Result Date: 05/16/2020 CLINICAL DATA:  Necrosis of the second and fourth digit on the RIGHT foot. Necrosis of the fifth digit on the LEFT foot. EXAM: RIGHT FOOT - 2 VIEW COMPARISON:  None. FINDINGS: No acute fracture or dislocation. Joint spaces and alignment are maintained. No area of erosion or osseous destruction on these limited views. No unexpected radiopaque foreign body. There is lucency projecting over the second and fourth toes. This may reflect subcutaneous air versus air trapped underneath overlying bandage. Soft tissue edema. IMPRESSION: 1. No definitive radiographic evidence of osteomyelitis. 2. Lucency projecting over the second and fourth toes may reflect subcutaneous air and/or air trapped underneath overlying bandage. Recommend correlation with physical exam. Electronically Signed   By: Valentino Saxon MD   On: 05/16/2020 13:09   DG Toe 5th Left  Result Date: 05/16/2020 CLINICAL DATA:  Necrosis of the fifth toe EXAM: DG TOE 5TH LEFT COMPARISON:  None. FINDINGS: Evaluation for fine osseous detail is limited secondary to overlying bandage. No acute fracture or dislocation. Joint spaces and alignment are maintained. No area of erosion or osseous destruction. No unexpected radiopaque foreign body. Soft tissues are unremarkable. IMPRESSION: No definitive radiographic evidence of osteomyelitis. Electronically Signed   By: Valentino Saxon MD   On: 05/16/2020 13:10      Assessment/Plan 1. Small Vessel Vasculitis 2. No evidence of Large Vessel or tibial pathology on CTA from 5/21 and palpable DP pulses bilaterally 3. However, the patient has not had an angiogram or further diagnostic studies since her gangrene/toes and leg wounds have worsened. 4. Will plan for diagnostic bilateral lower extremity angiogram on Monday per Dr. Lucky Cowboy, however, unlikely have  pathology that will be amenable revascularization as it appears to be small vessel  Vasculitis 5. Smoking cessation 6. Consult Rheumatology 7. Consult Podiatry for toe gangrene.   Evaristo Bury, MD  05/17/2020 12:37 PM    This note was created with Dragon medical transcription system.  Any error is purely unintentional

## 2020-05-17 NOTE — Consult Note (Addendum)
ORTHOPAEDIC CONSULTATION  REQUESTING PHYSICIAN: Sidney Ace, MD  Chief Complaint: Gangrene to the toes  HPI: Regina Carpenter is a 44 y.o. female who complains of worsening wounds and gangrenous changes to her toes.  Was seen at the wound care clinic and referred to the emergency department secondary to worsening redness swelling drainage and gangrenous changes to multiple toes to her feet.  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  Has been seen in the vasculitis clinic at [facility].  Has been evaluated by vascular surgery at [facility] as well.  She had chronic pain into her lower extremities.  She is noticed worsening lesions to both feet and legs.  She has been applying Neosporin to the wound on her right great toe and covering with a bandage of recent.  She is able to ambulate but states she has severe pain with ambulation and she develops lower extremity swelling with ambulation as well.  Past Medical History:  Diagnosis Date  . Arthralgia of right knee   . Arthritis   . Cellulitis of left leg   . Collagen vascular disease ([place])   . Lymphadenopathy, mesenteric   . Right hand pain   . Tobacco abuse    Past Surgical History:  Procedure Laterality Date  . denies     Social History   Socioeconomic History  . Marital status: Single    Spouse name: Not on file  . Number of children: Not on file  . Years of education: Not on file  . Highest education level: Not on file  Occupational History  . Not on file  Tobacco Use  . Smoking status: Current Every Day Smoker    Packs/day: 0.50    Types: Cigarettes  . Smokeless tobacco: Never Used  Vaping Use  . Vaping Use: Never used  Substance and Sexual Activity  . Alcohol use: No  . Drug use: No  . Sexual activity: Not on file  Other Topics Concern  . Not on file  Social History Narrative   Lives at home with father   Social Determinants of Health   Financial Resource Strain:     . Difficulty of Paying Living Expenses:   Food Insecurity:   . Worried About Charity fundraiser in the Last Year:   . Arboriculturist in the Last Year:   Transportation Needs:   . Film/video editor (Medical):   Marland Kitchen Lack of Transportation (Non-Medical):   Physical Activity:   . Days of Exercise per Week:   . Minutes of Exercise per Session:   Stress:   . Feeling of Stress :   Social Connections:   . Frequency of Communication with Friends and Family:   . Frequency of Social Gatherings with Friends and Family:   . Attends Religious Services:   . Active Member of Clubs or Organizations:   . Attends Archivist Meetings:   Marland Kitchen Marital Status:    Family History  Problem Relation Age of Onset  . Leukemia Maternal Grandfather   . Alcohol abuse Father    Allergies  Allergen Reactions  . Ibuprofen Other (See Comments)    Pt states that she sweats profusely after taking ibuprofen Other reaction(s): Other (See Comments) "sweating:"  . Sulfasalazine Rash   Prior to Admission medications   Medication Sig Start Date End Date Taking? Authorizing Provider  oxyCODONE-acetaminophen (PERCOCET) 5-325 MG tablet Take 1 tablet by mouth every 6 (six) hours as needed for  severe pain. 04/07/20  Yes Johnn Hai, PA-C  predniSONE (DELTASONE) 20 MG tablet Take 40 mg by mouth daily with breakfast.    Yes [provider]  cephALEXin (KEFLEX) 500 MG capsule Take 1 capsule (500 mg total) by mouth 3 (three) times daily. Patient not taking: Reported on 05/16/2020 04/07/20   Johnn Hai, PA-C  meloxicam (MOBIC) 15 MG tablet Take 1 tablet (15 mg total) by mouth daily. Patient not taking: Reported on 05/16/2020 12/16/19 12/15/20  Johnn Hai, PA-C   DG Foot 2 Views Right  Result Date: 05/16/2020 CLINICAL DATA:  Necrosis of the second and fourth digit on the RIGHT foot. Necrosis of the fifth digit on the LEFT foot. EXAM: RIGHT FOOT - 2 VIEW COMPARISON:  None. FINDINGS: No acute  fracture or dislocation. Joint spaces and alignment are maintained. No area of erosion or osseous destruction on these limited views. No unexpected radiopaque foreign body. There is lucency projecting over the second and fourth toes. This may reflect subcutaneous air versus air trapped underneath overlying bandage. Soft tissue edema. IMPRESSION: 1. No definitive radiographic evidence of osteomyelitis. 2. Lucency projecting over the second and fourth toes may reflect subcutaneous air and/or air trapped underneath overlying bandage. Recommend correlation with physical exam. Electronically Signed   By: Valentino Saxon MD   On: 05/16/2020 13:09   DG Toe 5th Left  Result Date: 05/16/2020 CLINICAL DATA:  Necrosis of the fifth toe EXAM: DG TOE 5TH LEFT COMPARISON:  None. FINDINGS: Evaluation for fine osseous detail is limited secondary to overlying bandage. No acute fracture or dislocation. Joint spaces and alignment are maintained. No area of erosion or osseous destruction. No unexpected radiopaque foreign body. Soft tissues are unremarkable. IMPRESSION: No definitive radiographic evidence of osteomyelitis. Electronically Signed   By: Valentino Saxon MD   On: 05/16/2020 13:10    Positive ROS: All other systems have been reviewed and were otherwise negative with the exception of those mentioned in the HPI and as above.  12 point ROS was performed.  Physical Exam: General: Alert and oriented.  No apparent distress.  Vascular:  Left foot:Dorsalis Pedis:  present Posterior Tibial:  diminished  Right foot: Dorsalis pedis: Present  Posterior tibial: Present  Neuro:intact sensation  Derm: Obvious gangrenous changes to the distal right second toe and fourth toes.  Full-thickness necrotic ulcer on the dorsal aspect of the right foot at the base of the third and fourth toes.  Mild surrounding erythema to the areas.  No obvious lymphangitic streaking.  Wound on the plantar aspect of the right great toe is  full-thickness with fibrotic tissue.  This measures approximately 2 and half centimeters in diameter.  Left foot with gangrenous left fifth toe.  Right foot      Left foot      Ortho/MS: She has limited range of motion secondary to pain.  Chart review Vascular from [facility]: 1. DP and digital vessel doppler signals intact in clinic today. T-comm with appropriate values. Recommend follow up with vasculitis clinic, no evidence of large vessel vascular disease. 2. No indication for follow up with vascular surgery clinic. 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 In May developed ischemic ulcers on toes, underwent biopsy showing medium vessel vasculitis. No evidence of other organ involvement. She has been on high-dose prednisone for this for about 1.5  months now - has not had improvement of her vasculitic lesions yet.  - Agree with Cytoxan for treatment of vasculitis - would plan to continue but will switch to IV Cytoxan to minimize cumulative dose but also given prior challenges with adherence. Plan for 2-3 doses at this time. Spoke today with her rheumatologist - we will plan to do Cytoxan in [facility] and we will follow her while she is receiving Cytoxan. She has a sulfa allergy - will prescribe atovaquone while on Cytoxan + high dose steroids. Check G6PD, can switch to dapsone if ok.  - Would trend inflammatory markers to monitor for response to therapy. I am encouraged that she has no evidence of other organ involvement and has not developed any new areas of ischemia or worsening of existing areas since onset, and she reports that the area on her R lower leg is improved.  - Did not take nifedipine as this caused marked LE edema for her which was not tolerable. Will try topical nifedipine - encouraged her  to continue as long as she is tolerating, even if no immediate effect. Could also try topical nitrate.  - Prescribed lidocaine ointment to see if this helps with pain. However, discussed that ischemia is very painful and unfortunately she will likely continue to have pain until the ischemia improves. She has been getting oxycodone from her other provider though says this hasn't helped a lot.  - She has not had COVID vaccine. I discussed with her on the phone today after her visit and strongly encouraged her to get this as soon as possible to protect herself in the context of the strong immunosuppression she has been and will be getting, she states she is going to do this.  - Agree with wound care for management of existing ulcers (has been referred and has appointment).    Assessment: Mixed connective tissue disease with vasculitis. Gangrene of right second and fourth toes and left fifth toe Full-thickness ulcer to right great toe  Plan: Patient has been seen by vascular surgery and is planning angiogram for next week.  This is a difficult case as patient has palpable blood flow with likely small vessel disease and vasculitis causing gangrenous changes to the toes.  Will be difficult to heal wounds.  We discussed the possibility of amputation of the gangrenous toes and will plan for next week after vascular has evaluated.  Patient may benefit from transmetatarsal amputation on the right foot given the dorsal wound at the base of the third and fourth toes as well as the full-thickness ulcer to the great toe.  Continue palliative measures to lower extremities with local wound care for now.    Elesa Hacker, DPM Cell [phone]   05/17/2020 12:41 PM

## 2020-05-17 NOTE — Progress Notes (Signed)
Patient eventually achieved satisfactory pain relief after admin of 1mg  Dilaudid IV. Rested for a couple of hours after received. RN gave patient supply of Percocet from home to charge nurse to take to pharmacy. The patient and this RN; then this RN and charge nurse verified there were 16 whole tablets, and 2-half tablets in the bottle. Receipt will be placed in patient chart to be given to her at time of discharge.

## 2020-05-18 LAB — CREATININE, SERUM
Creatinine, Ser: 0.64 mg/dL (ref 0.44–1.00)
GFR calc Af Amer: >60 mL/min (ref >60)
GFR calc non Af Amer: >60 mL/min (ref >60)

## 2020-05-18 MED ORDER — MUPIROCIN 2 % EX OINT
1.0000 "application " | TOPICAL_OINTMENT | Freq: Two times a day (BID) | CUTANEOUS | Status: AC
  Administered 2020-05-19 – 2020-05-23 (×8): 1 via NASAL
  Filled 2020-05-18: qty 22

## 2020-05-18 NOTE — Progress Notes (Signed)
   05/18/20 0030  Assess: MEWS Score  Temp 98.7 F (37.1 C)  BP (!) 122/91  Pulse Rate (!) 115  Resp 18  SpO2 96 %  O2 Device Room Air  Assess: MEWS Score  MEWS Temp 0  MEWS Systolic 0  MEWS Pulse 2  MEWS RR 0  MEWS LOC 0  MEWS Score 2  MEWS Score Color Yellow  Assess: if the MEWS score is Yellow or Red  Were vital signs taken at a resting state? Yes  Focused Assessment No change from prior assessment  Early Detection of Sepsis Score *See Row Information* Low  MEWS guidelines implemented *See Row Information* No, vital signs rechecked  Treat  MEWS Interventions Administered scheduled meds/treatments  Take Vital Signs  Increase Vital Sign Frequency  Yellow: Q 2hr X 2 then Q 4hr X 2, if remains yellow, continue Q 4hrs  Escalate  MEWS: Escalate Yellow: discuss with charge nurse/RN and consider discussing with provider and RRT  Notify: Charge Nurse/RN  Name of Charge Nurse/RN Notified Teresa, RN  Date Charge Nurse/RN Notified 05/18/20  Time Charge Nurse/RN Notified [phone]

## 2020-05-18 NOTE — Consult Note (Signed)
Pharmacy Antibiotic Note  Regina Carpenter is a 44 y.o. female admitted on 05/16/2020 with cellulitis.  Pharmacy has been consulted for Vancomycin dosing.  Plan: Continue vancomycin 1000 mg IV q12h. Patient to have angiogram Monday with Vascular. Podiatry following for toe gangrene. Will follow up plan and obtain vanc trough as clinically appropriate depending on planned antibiotic course.  Height: 5\' 5"  (165.1 cm) Weight: 83.9 kg (185 lb) IBW/kg (Calculated) : 57  Temp (24hrs), Avg:98.7 F (37.1 C), Min:98.3 F (36.8 C), Max:98.9 F (37.2 C)  Recent Labs  Lab 05/16/20 1141 05/16/20 1544 05/17/20 0556 05/18/20 0523  WBC 19.8*  --  16.8*  --   CREATININE 0.73  --  0.78 0.64  LATICACIDVEN 2.2* 1.2  --   --     Estimated Creatinine Clearance: 96.1 mL/min (by C-G formula based on SCr of 0.64 mg/dL).    Allergies  Allergen Reactions  . Ibuprofen Other (See Comments)    Pt states that she sweats profusely after taking ibuprofen Other reaction(s): Other (See Comments) "sweating:"  . Sulfasalazine Rash    Antimicrobials this admission: 8/13 Zosyn x 1 Vancomycin 8/13 >>  Dose adjustments this admission: None  Microbiology results: 8/13 BCx: NG COVID negative  Thank you for allowing pharmacy to be a part of this patient's care.  Tawnya Crook, PharmD Clinical Pharmacist 05/18/2020 10:52 AM

## 2020-05-18 NOTE — Progress Notes (Signed)
PROGRESS NOTE    Regina Carpenter  PTW:[id number] DOB: [birth year] DOA: 05/16/2020 PCP: Emmaline Kluver., MD   Brief Narrative:  HPI: Regina Carpenter is a 44 y.o. female with medical history significant of rheumatoid arthritis, polyarteritis vasculitis, chronic bilateral foot wounds who presents to the emergency department for concern of worsening wound of the bilateral feet.  The patient states that she has known severe vasculopathy and over the past several months her lesions started as painful ulcers on multiple toes that is now progressed into a gangrenous state.  The wounds have become increasingly painful and had some foul-smelling drainage from the area as well.  The patient was at the wound care center today and on presentation there the patient was sent here for further evaluation  Patient denies any fevers or chills denies any weight loss, denies any other systemic symptoms.  Her only complaints are pain in bilateral lower extremities.  On review of her rheumatology notes from Dr. Jefm Bryant she has known systemic vasculitis and no ischemic disease of the digits on the right leg.  She also has a known large 3 to 4 cm ulcer on the right lateral leg.  She states these ulcers have been worsening in severity and pain.  On my evaluation in the ED patient is resting comfortably in bed.  She is in minimal distress.  On interview it appears that patient was previously on disease modifying agents such as methotrexate however has been taken off by her rheumatologist.  She is currently just on prednisone.  8/14: Patient seen and examined.  Some issues with pain control overnight.  Control achieved with Dilaudid.  Seen by podiatry and vascular surgery.  Plan for angiogram next week.  8/15: Patient seen and examined.  Improved pain control.  Remains on IV antibiotics.  Pending angiography and further surgical intervention.  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)   Rheumatoid arthritis (HCC)   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HCC)   Essential hypertension   Smoking  Wound infection in the setting of a systemic vasculitis Sepsis secondary to above, improved Lactic acidosis secondary to above Patient meets sepsis criteria with leukocytosis of 19.8, tachycardia, elevated lactic acid Presumptive source is wound infection Vascular surgery contacted from ED Podiatry contacted from ED Patient started on broad-spectrum antibiotics in the emergency department IV fluids given Plan: Continue IVF, 75 cc/h Continue IV vancomycin, pharmacy dosing Continue Rocephin 2 g every 24 hours Continue Flagyl 500 mg IV every 8 hours Follow blood cultures, no growth to date Titrate antibiotics as appropriate Vascular consult, plan for angiogram next week Podiatry consult, surgical plan pending angiography Pain control as needed Patient is known to Dr. Jefm Bryant from rheumatology.  We do not have rheumatology coverage at [facility] over the weekend.  We will reach out to Dr. Jefm Bryant tomorrow for any recommendations  Rheumatoid arthritis Systemic vasculitis Patient known to Dr. Jefm Bryant Recently taken off methotrexate and switched to Cytoxan Plan: Unfortunately Cytoxan not formulary we are unable to acquire.  We will reach out to Dr. Jefm Bryant tomorrow for recommendations. Continue prednisone 5 mg daily  Hypertension Amlodipine 2.5 mg daily per home dose   DVT prophylaxis: Lovenox Code Status: Full Family Communication: None today Disposition Plan: Status is: Inpatient  Remains inpatient appropriate because:Inpatient level of care appropriate due to severity of illness   Dispo: The patient is from: Home  Anticipated d/c is to: Home              Anticipated d/c date is: > 3 days              Patient currently is not medically stable to d/c.  Pending surgical evaluation  Consultants:     Vascular surgery  Podiatry  Procedures:   None  Antimicrobials:   Vancomycin  Rocephin  Flagyl   Subjective: Patient seen and examined.  Endorsing pain in the right foot.  Objective: Vitals:   05/18/20 0429 05/18/20 0819 05/18/20 0822 05/18/20 1259  BP: (!) 130/92 (!) 149/100 (!) 137/99 (!) 127/91  Pulse: (!) 108 (!) 116 (!) 110 (!) 114  Resp: 18 18 18 17   Temp: 98.9 F (37.2 C) 98.7 F (37.1 C) 98.6 F (37 C) 98.7 F (37.1 C)  TempSrc: Oral Oral Oral Oral  SpO2: 98% 98% 100% 99%  Weight:      Height:        Intake/Output Summary (Last 24 hours) at 05/18/2020 1333 Last data filed at 05/18/2020 1236 Gross per 24 hour  Intake 2797.67 ml  Output --  Net 2797.67 ml   Filed Weights   05/16/20 1139 05/16/20 2030  Weight: 83 kg 83.9 kg    Examination:  General: No apparent distress, patient appears well HEENT: Normocephalic, atraumatic Neck, supple, trachea midline, no tenderness Heart: Regular rate and rhythm, S1/S2 normal, no murmurs Lungs: Clear to auscultation bilaterally, no adventitious sounds, normal work of breathing Abdomen: Soft, nontender, nondistended, positive bowel sounds Extremities: Right second toe ischemic.  30 palpable pulses.  No significant edema.  4 cm ulcer on right lateral leg.  1 cm lesion on right fourth toe.  1 cm raised lesion on left second toe. Skin: No rashes or lesions, normal color Neurologic: Cranial nerves grossly intact, sensation intact, alert and oriented x3 Psychiatric: Normal affect     Data Reviewed: I have personally reviewed following labs and imaging studies  CBC: Recent Labs  Lab 05/16/20 1141 05/17/20 0556  WBC 19.8* 16.8*  NEUTROABS 17.2*  --   HGB 11.7* 10.4*  HCT 37.3 36.0  MCV 79.0* 81.8  PLT 403* 751   Basic Metabolic Panel: Recent Labs  Lab 05/16/20 1141 05/17/20 0556 05/18/20 0523  NA 135 137  --   K 4.2 3.7  --   CL 100 103  --   CO2 25 25  --   GLUCOSE 177* 81  --   BUN 9 9  --    CREATININE 0.73 0.78 0.64  CALCIUM 9.6 9.2  --    GFR: Estimated Creatinine Clearance: 96.1 mL/min (by C-G formula based on SCr of 0.64 mg/dL). Liver Function Tests: Recent Labs  Lab 05/16/20 1141 05/17/20 0556  AST 17 13*  ALT 18 15  ALKPHOS 64 57  BILITOT 0.6 0.7  PROT 7.8 7.5  ALBUMIN 3.6 3.5   No results for input(s): LIPASE, AMYLASE in the last 168 hours. No results for input(s): AMMONIA in the last 168 hours. Coagulation Profile: Recent Labs  Lab 05/17/20 0556  INR 1.0   Cardiac Enzymes: No results for input(s): CKTOTAL, CKMB, CKMBINDEX, TROPONINI in the last 168 hours. BNP (last 3 results) No results for input(s): PROBNP in the last 8760 hours. HbA1C: No results for input(s): HGBA1C in the last 72 hours. CBG: No results for input(s): GLUCAP in the last 168 hours. Lipid Profile: No results for input(s): CHOL, HDL, LDLCALC, TRIG, CHOLHDL, LDLDIRECT in the last  72 hours. Thyroid Function Tests: No results for input(s): TSH, T4TOTAL, FREET4, T3FREE, THYROIDAB in the last 72 hours. Anemia Panel: No results for input(s): VITAMINB12, FOLATE, FERRITIN, TIBC, IRON, RETICCTPCT in the last 72 hours. Sepsis Labs: Recent Labs  Lab 05/16/20 1141 05/16/20 1544 05/17/20 0556  PROCALCITON  --   --  <0.10  LATICACIDVEN 2.2* 1.2  --     Recent Results (from the past 240 hour(s))  Blood culture (routine x 2)     Status: None (Preliminary result)   Collection Time: 05/16/20  3:49 PM   Specimen: BLOOD  Result Value Ref Range Status   Specimen Description BLOOD LEFT ANTECUBITAL  Final   Special Requests   Final    BOTTLES DRAWN AEROBIC AND ANAEROBIC Blood Culture results may not be optimal due to an inadequate volume of blood received in culture bottles   Culture   Final    NO GROWTH 2 DAYS Performed at [facility], [address], [place], [place] [address]    Report Status PENDING  Incomplete  Blood culture (routine x 2)     Status: None (Preliminary  result)   Collection Time: 05/16/20  3:54 PM   Specimen: BLOOD  Result Value Ref Range Status   Specimen Description BLOOD BLOOD RIGHT WRIST  Final   Special Requests   Final    BOTTLES DRAWN AEROBIC AND ANAEROBIC Blood Culture adequate volume   Culture   Final    NO GROWTH 2 DAYS Performed at [facility], [address], [place], [place] [address]    Report Status PENDING  Incomplete  SARS Coronavirus 2 by RT PCR (hospital order, performed in [place] hospital lab) Nasopharyngeal Nasopharyngeal Swab     Status: None   Collection Time: 05/16/20  5:46 PM   Specimen: Nasopharyngeal Swab  Result Value Ref Range Status   SARS Coronavirus 2 NEGATIVE NEGATIVE Final    Comment: (NOTE) SARS-CoV-2 target nucleic acids are NOT DETECTED.  The SARS-CoV-2 RNA is generally detectable in upper and lower respiratory specimens during the acute phase of infection. The lowest concentration of SARS-CoV-2 viral copies this assay can detect is 250 copies / mL. A negative result does not preclude SARS-CoV-2 infection and should not be used as the sole basis for treatment or other patient management decisions.  A negative result may occur with improper specimen collection / handling, submission of specimen other than nasopharyngeal swab, presence of viral mutation(s) within the areas targeted by this assay, and inadequate number of viral copies (<250 copies / mL). A negative result must be combined with clinical observations, patient history, and epidemiological information.  Fact Sheet for Patients:   StrictlyIdeas.no  Fact Sheet for Healthcare Providers: BankingDealers.co.za  This test is not yet approved or  cleared by the [place] FDA and has been authorized for detection and/or diagnosis of SARS-CoV-2 by FDA under an Emergency Use Authorization (EUA).  This EUA will remain in effect (meaning this test can be used) for the duration  of the COVID-19 declaration under Section 564(b)(1) of the Act, 21 U.S.C. section 360bbb-3(b)(1), unless the authorization is terminated or revoked sooner.  Performed at [facility], [address] [place], [place] [address]   Culture, blood (single)     Status: None (Preliminary result)   Collection Time: 05/16/20  8:52 PM   Specimen: BLOOD  Result Value Ref Range Status   Specimen Description BLOOD RIGHT ANTECUBITAL  Final   Special Requests   Final  BOTTLES DRAWN AEROBIC AND ANAEROBIC Blood Culture adequate volume   Culture   Final    NO GROWTH 2 DAYS Performed at [facility], [address] [place], [place] [address]    Report Status PENDING  Incomplete         Radiology Studies: No results found.      Scheduled Meds: . amLODipine  5 mg Oral Daily  . cyclophosphamide  50 mg Oral Daily  . enoxaparin (LOVENOX) injection  40 mg Subcutaneous Q24H  . folic acid  1 mg Oral Daily  . gabapentin  300 mg Oral TID  . nicotine  7 mg Transdermal Daily  . predniSONE  5 mg Oral Q breakfast   Continuous Infusions: . sodium chloride 75 mL/hr at 05/18/20 1236  . cefTRIAXone (ROCEPHIN)  IV 2 g (05/17/20 2349)  . lactated ringers     And  . lactated ringers    . metronidazole 500 mg (05/18/20 0501)  . vancomycin 1,000 mg (05/18/20 1235)     LOS: 2 days    Time spent: 25 minutes    Sidney Ace, MD [facility] Pager [phone]  If 7PM-7AM, please contact night-coverage 05/18/2020, 1:33 PM

## 2020-05-18 NOTE — Plan of Care (Signed)

## 2020-05-19 ENCOUNTER — Encounter
Admission: EM | Disposition: A | Discharge status: Home Health | Payer: Self-pay | Source: Home / Self Care | Attending: Internal Medicine

## 2020-05-19 DIAGNOSIS — I96 Gangrene, not elsewhere classified: Secondary | ICD-10-CM

## 2020-05-19 HISTORY — PX: LOWER EXTREMITY ANGIOGRAPHY: CATH118251

## 2020-05-19 LAB — BASIC METABOLIC PANEL
Anion gap: 11 (ref 5–15)
BUN: 7 mg/dL (ref 6–20)
CO2: 24 mmol/L (ref 22–32)
Calcium: 8.6 mg/dL — ABNORMAL LOW (ref 8.9–10.3)
Chloride: 103 mmol/L (ref 98–111)
Creatinine, Ser: 0.75 mg/dL (ref 0.44–1.00)
GFR calc Af Amer: >60 mL/min (ref >60)
GFR calc non Af Amer: >60 mL/min (ref >60)
Glucose, Bld: 79 mg/dL (ref 70–99)
Potassium: 3.8 mmol/L (ref 3.5–5.1)
Sodium: 138 mmol/L (ref 135–145)

## 2020-05-19 LAB — CBC WITH DIFFERENTIAL/PLATELET
Abs Immature Granulocytes: 0.08 10*3/uL — ABNORMAL HIGH (ref 0.00–0.07)
Basophils Absolute: 0.1 10*3/uL (ref 0.0–0.1)
Basophils Relative: 0 %
Eosinophils Absolute: 0.2 10*3/uL (ref 0.0–0.5)
Eosinophils Relative: 2 %
HCT: 32.2 % — ABNORMAL LOW (ref 36.0–46.0)
Hemoglobin: 9.8 g/dL — ABNORMAL LOW (ref 12.0–15.0)
Immature Granulocytes: 1 %
Lymphocytes Relative: 37 %
Lymphs Abs: 4.2 10*3/uL — ABNORMAL HIGH (ref 0.7–4.0)
MCH: 24 pg — ABNORMAL LOW (ref 26.0–34.0)
MCHC: 30.4 g/dL (ref 30.0–36.0)
MCV: 78.7 fL — ABNORMAL LOW (ref 80.0–100.0)
Monocytes Absolute: 1.1 10*3/uL — ABNORMAL HIGH (ref 0.1–1.0)
Monocytes Relative: 9 %
Neutro Abs: 5.8 10*3/uL (ref 1.7–7.7)
Neutrophils Relative %: 51 %
Platelets: 305 10*3/uL (ref 150–400)
RBC: 4.09 MIL/uL (ref 3.87–5.11)
RDW: 19 % — ABNORMAL HIGH (ref 11.5–15.5)
WBC: 11.4 10*3/uL — ABNORMAL HIGH (ref 4.0–10.5)
nRBC: 0 % (ref 0.0–0.2)

## 2020-05-19 LAB — SURGICAL PCR SCREEN
MRSA, PCR: POSITIVE — AB
Staphylococcus aureus: POSITIVE — AB

## 2020-05-19 LAB — PREGNANCY, URINE: Preg Test, Ur: NEGATIVE

## 2020-05-19 SURGERY — LOWER EXTREMITY ANGIOGRAPHY
Anesthesia: Moderate Sedation | Laterality: Bilateral

## 2020-05-19 MED ORDER — FENTANYL CITRATE (PF) 100 MCG/2ML IJ SOLN
INTRAMUSCULAR | Status: DC | PRN
  Administered 2020-05-19: 50 ug via INTRAVENOUS
  Administered 2020-05-19: 25 ug via INTRAVENOUS

## 2020-05-19 MED ORDER — FENTANYL CITRATE (PF) 100 MCG/2ML IJ SOLN
INTRAMUSCULAR | Status: AC
  Filled 2020-05-19: qty 2

## 2020-05-19 MED ORDER — MIDAZOLAM HCL 5 MG/5ML IJ SOLN
INTRAMUSCULAR | Status: AC
  Filled 2020-05-19: qty 5

## 2020-05-19 MED ORDER — HEPARIN SODIUM (PORCINE) 1000 UNIT/ML IJ SOLN
INTRAMUSCULAR | Status: AC
  Filled 2020-05-19: qty 1

## 2020-05-19 MED ORDER — MIDAZOLAM HCL 2 MG/2ML IJ SOLN
INTRAMUSCULAR | Status: DC | PRN
  Administered 2020-05-19: 2 mg via INTRAVENOUS
  Administered 2020-05-19: 1 mg via INTRAVENOUS

## 2020-05-19 SURGICAL SUPPLY — 8 items
CATH PIG 70CM (CATHETERS) ×1 IMPLANT
DEVICE STARCLOSE SE CLOSURE (Vascular Products) ×1 IMPLANT
GLIDEWIRE ADV .035X180CM (WIRE) ×1 IMPLANT
PACK ANGIOGRAPHY (CUSTOM PROCEDURE TRAY) ×2 IMPLANT
SHEATH BRITE TIP 5FRX11 (SHEATH) ×1 IMPLANT
SYR MEDRAD MARK 7 150ML (SYRINGE) ×1 IMPLANT
TUBING CONTRAST HIGH PRESS 72 (TUBING) ×1 IMPLANT
WIRE J 3MM .035X145CM (WIRE) ×1 IMPLANT

## 2020-05-19 NOTE — Progress Notes (Signed)
Day of Surgery   Subjective/Chief Complaint: Patient seen for follow-up of the gangrenous changes in her right foot.  Relates continued significant pain in the foot.  Recently underwent vascular evaluation earlier today.   Objective: Vital signs in last 24 hours: Temp:  [98.2 F (36.8 C)-99.2 F (37.3 C)] 98.2 F (36.8 C) (08/16 1254) Pulse Rate:  [88-120] 88 (08/16 1254) Resp:  [15-21] 16 (08/16 1254) BP: (108-147)/(69-100) 129/95 (08/16 1254) SpO2:  [95 %-100 %] 100 % (08/16 1254) Last BM Date: 05/19/20  Intake/Output from previous day: 08/15 0701 - 08/16 0700 In: 480 [P.O.:480] Out: -  Intake/Output this shift: Total I/O In: 720 [P.O.:720] Out: -   Gangrenous changes on the right second and fourth toes appear stable as compared to previous pictures.  Continued full-thickness ulcerative area beneath the hallux.  Full-thickness ulcerative areas at the dorsal distal forefoot at the base of the third and fourth toes.   Was present for the ending of the patient's vascular evaluation with Dr. Lucky Cowboy.  Spoke with Dr. Lucky Cowboy about her prognosis.  Posterior tibial and peroneal vessels appear to shut down around the level of the ankle with the anterior tibial vessel extending to the dorsal midfoot.  At this point it is felt that digital amputations would have a very small chance of healing due to her vascular status.  Recommendation at this point would be for a transmetatarsal amputation of the right foot.  Left foot appears stable at this point although the circulation was noted to be in the same condition on the left.  Lab Results:  Recent Labs    05/17/20 0556 05/19/20 0340  WBC 16.8* 11.4*  HGB 10.4* 9.8*  HCT 36.0 32.2*  PLT 347 305   BMET Recent Labs    05/17/20 0556 05/17/20 0556 05/18/20 0523 05/19/20 0340  NA 137  --   --  138  K 3.7  --   --  3.8  CL 103  --   --  103  CO2 25  --   --  24  GLUCOSE 81  --   --  79  BUN 9  --   --  7  CREATININE 0.78   < > 0.64 0.75   CALCIUM 9.2  --   --  8.6*   < > = values in this interval not displayed.   PT/INR Recent Labs    05/17/20 0556  LABPROT 12.9  INR 1.0   ABG No results for input(s): PHART, HCO3 in the last 72 hours.  Invalid input(s): PCO2, PO2  Studies/Results: PERIPHERAL VASCULAR CATHETERIZATION  Result Date: 05/19/2020 See op note   Anti-infectives: Anti-infectives (From admission, onward)   Start     Dose/Rate Route Frequency Ordered Stop   05/17/20 1200  vancomycin (VANCOCIN) IVPB 1000 mg/200 mL premix     Discontinue     1,000 mg 200 mL/hr over 60 Minutes Intravenous Every 12 hours 05/17/20 1100     05/16/20 2200  cefTRIAXone (ROCEPHIN) 2 g in sodium chloride 0.9 % 100 mL IVPB     Discontinue     2 g 200 mL/hr over 30 Minutes Intravenous Every 24 hours 05/16/20 1718     05/16/20 2200  metroNIDAZOLE (FLAGYL) IVPB 500 mg     Discontinue     500 mg 100 mL/hr over 60 Minutes Intravenous Every 8 hours 05/16/20 1721     05/16/20 1730  vancomycin (VANCOCIN) IVPB 1000 mg/200 mL premix  Status:  Discontinued  1,000 mg 200 mL/hr over 60 Minutes Intravenous  Once 05/16/20 1718 05/16/20 1802   05/16/20 1630  vancomycin (VANCOREADY) IVPB 1500 mg/300 mL        1,500 mg 150 mL/hr over 120 Minutes Intravenous  Once 05/16/20 1609 05/16/20 2031   05/16/20 1630  piperacillin-tazobactam (ZOSYN) IVPB 3.375 g        3.375 g 12.5 mL/hr over 240 Minutes Intravenous  Once 05/16/20 1609 05/16/20 2016      Assessment/Plan: s/p Procedure(s): Lower Extremity Angiography (Bilateral) Assessment: 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   Plan: Discussed with the patient the findings from her vascular procedure.  Discussed that she has no significant blood flow extending out to the level of the toes.  Discussed my conversation with Dr. Lucky Cowboy and recommended at this point that a transmetatarsal amputation would be her best chance to try to salvage her foot.   Discussed possible risks and complications of the procedure mainly including inability to heal due to her vascular status.  No guarantees could be given as to the outcome.  Questions invited and answered.  Patient agrees to proceed with transmetatarsal amputation on the right foot.  We will obtain consent for transmetatarsal amputation right foot.  N.p.o. after midnight.  Plan for surgery tomorrow afternoon.  LOS: 3 days    Regina Carpenter 05/19/2020

## 2020-05-19 NOTE — H&P (View-Only) (Signed)
Day of Surgery   Subjective/Chief Complaint: Patient seen for follow-up of the gangrenous changes in her right foot.  Relates continued significant pain in the foot.  Recently underwent vascular evaluation earlier today.   Objective: Vital signs in last 24 hours: Temp:  [98.2 F (36.8 C)-99.2 F (37.3 C)] 98.2 F (36.8 C) (08/16 1254) Pulse Rate:  [88-120] 88 (08/16 1254) Resp:  [15-21] 16 (08/16 1254) BP: (108-147)/(69-100) 129/95 (08/16 1254) SpO2:  [95 %-100 %] 100 % (08/16 1254) Last BM Date: 05/19/20  Intake/Output from previous day: 08/15 0701 - 08/16 0700 In: 480 [P.O.:480] Out: -  Intake/Output this shift: Total I/O In: 720 [P.O.:720] Out: -   Gangrenous changes on the right second and fourth toes appear stable as compared to previous pictures.  Continued full-thickness ulcerative area beneath the hallux.  Full-thickness ulcerative areas at the dorsal distal forefoot at the base of the third and fourth toes.   Was present for the ending of the patient's vascular evaluation with Dr. Lucky Cowboy.  Spoke with Dr. Lucky Cowboy about her prognosis.  Posterior tibial and peroneal vessels appear to shut down around the level of the ankle with the anterior tibial vessel extending to the dorsal midfoot.  At this point it is felt that digital amputations would have a very small chance of healing due to her vascular status.  Recommendation at this point would be for a transmetatarsal amputation of the right foot.  Left foot appears stable at this point although the circulation was noted to be in the same condition on the left.  Lab Results:  Recent Labs    05/17/20 0556 05/19/20 0340  WBC 16.8* 11.4*  HGB 10.4* 9.8*  HCT 36.0 32.2*  PLT 347 305   BMET Recent Labs    05/17/20 0556 05/17/20 0556 05/18/20 0523 05/19/20 0340  NA 137  --   --  138  K 3.7  --   --  3.8  CL 103  --   --  103  CO2 25  --   --  24  GLUCOSE 81  --   --  79  BUN 9  --   --  7  CREATININE 0.78   < > 0.64 0.75   CALCIUM 9.2  --   --  8.6*   < > = values in this interval not displayed.   PT/INR Recent Labs    05/17/20 0556  LABPROT 12.9  INR 1.0   ABG No results for input(s): PHART, HCO3 in the last 72 hours.  Invalid input(s): PCO2, PO2  Studies/Results: PERIPHERAL VASCULAR CATHETERIZATION  Result Date: 05/19/2020 See op note   Anti-infectives: Anti-infectives (From admission, onward)   Start     Dose/Rate Route Frequency Ordered Stop   05/17/20 1200  vancomycin (VANCOCIN) IVPB 1000 mg/200 mL premix     Discontinue     1,000 mg 200 mL/hr over 60 Minutes Intravenous Every 12 hours 05/17/20 1100     05/16/20 2200  cefTRIAXone (ROCEPHIN) 2 g in sodium chloride 0.9 % 100 mL IVPB     Discontinue     2 g 200 mL/hr over 30 Minutes Intravenous Every 24 hours 05/16/20 1718     05/16/20 2200  metroNIDAZOLE (FLAGYL) IVPB 500 mg     Discontinue     500 mg 100 mL/hr over 60 Minutes Intravenous Every 8 hours 05/16/20 1721     05/16/20 1730  vancomycin (VANCOCIN) IVPB 1000 mg/200 mL premix  Status:  Discontinued  1,000 mg 200 mL/hr over 60 Minutes Intravenous  Once 05/16/20 1718 05/16/20 1802   05/16/20 1630  vancomycin (VANCOREADY) IVPB 1500 mg/300 mL        1,500 mg 150 mL/hr over 120 Minutes Intravenous  Once 05/16/20 1609 05/16/20 2031   05/16/20 1630  piperacillin-tazobactam (ZOSYN) IVPB 3.375 g        3.375 g 12.5 mL/hr over 240 Minutes Intravenous  Once 05/16/20 1609 05/16/20 2016      Assessment/Plan: s/p Procedure(s): Lower Extremity Angiography (Bilateral) Assessment: 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   Plan: Discussed with the patient the findings from her vascular procedure.  Discussed that she has no significant blood flow extending out to the level of the toes.  Discussed my conversation with Dr. Lucky Cowboy and recommended at this point that a transmetatarsal amputation would be her best chance to try to salvage her foot.   Discussed possible risks and complications of the procedure mainly including inability to heal due to her vascular status.  No guarantees could be given as to the outcome.  Questions invited and answered.  Patient agrees to proceed with transmetatarsal amputation on the right foot.  We will obtain consent for transmetatarsal amputation right foot.  N.p.o. after midnight.  Plan for surgery tomorrow afternoon.  LOS: 3 days    [name] 05/19/2020

## 2020-05-19 NOTE — Interval H&P Note (Signed)
History and Physical Interval Note:  05/19/2020 8:11 AM  Regina Carpenter  has presented today for surgery, with the diagnosis of Gangrene Bilateral Toes.  The various methods of treatment have been discussed with the patient and family. After consideration of risks, benefits and other options for treatment, the patient has consented to  Procedure(s): Lower Extremity Angiography (Bilateral) as a surgical intervention.  The patient's history has been reviewed, patient examined, no change in status, stable for surgery.  I have reviewed the patient's chart and labs.  Questions were answered to the patient's satisfaction.     Leotis Pain

## 2020-05-19 NOTE — Consult Note (Signed)
Reason for Consult: Vasculitis  Referring Physician: Hospitalist  Regina Carpenter   HPI: 44 year old African-American female.  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  In the last months developed nodules on her leg as well as ischemic toe.  Thought to be vasculitis.  Urinalysis and creatinine were normal.  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  She was started on injectable methotrexate and prednisone.  Sed rate CRP were elevated but improved but she had persistent vasculitic changes of the toes.  She was started on oral Cytoxan and referred to the [facility] vasculitis clinic.  She was seen by vascular surgery and not thought to be surgical She is scheduled for IV Cytoxan on Thursday at [facility] vasculitis clinic In the interim she developed worsening pain and ischemic changes in the toes on the right.  She was seen by the wound clinic and referred to the ER and admitted.  She has had IV antibiotics.  Arteriogram reportedly showed distal small vessel occlusion in the right foot.  Recommended trans metatarsal amputation. She denies any changes in her hands.  There is been no Raynaud's.  Recent creatinine or no is normal.  She has mild anemia.  Sed rate 36.  CRP 3.8.  White count 16,000.  Platelets 347,000 No drug use.  Hepatitis B and C markers previously were negative.  PMH: Uveitis.  Inflammatory arthritis  SURGICAL HISTORY: No joint surgeries  Family History: Negative for connective tissue disease  Social History: No cocaine or injectable drug use.  Allergies:  Allergies  Allergen Reactions  . Ibuprofen Other (See Comments)    Pt states that she sweats profusely after taking ibuprofen Other reaction(s): Other (See Comments) "sweating:"  . Sulfasalazine Rash    Medications:  Scheduled: . amLODipine  5 mg Oral Daily  . cyclophosphamide  50 mg Oral Daily  . enoxaparin (LOVENOX) injection  40 mg  Subcutaneous Q24H  . fentaNYL      . folic acid  1 mg Oral Daily  . gabapentin  300 mg Oral TID  . midazolam      . mupirocin ointment  1 application Nasal BID  . nicotine  7 mg Transdermal Daily  . predniSONE  5 mg Oral Q breakfast        ROS: No shortness of breath.  No fever.  No abdominal pain.  No blood per urine or per rectum   PHYSICAL EXAM: Blood pressure 114/84, pulse 96, temperature 98.2 F (36.8 C), temperature source Oral, resp. rate 17, height 5\' 5"  (1.651 m), weight 83.9 kg, last menstrual period 04/26/2020, SpO2 97 %. Somnolent female.  I woke up to exam  her and discuss.  Alert and oriented.  Eschar with rounded lesions left lateral ankle.  Right lateral leg has open wound which has been wrapped.  Ischemic toes of the right foot first second and fourth with ulcer at the base of the third and fourth.  Left foot has ischemic necrotic fifth toe.  Distal pulses appreciated.  No livido rash.  Clear chest.  No murmur.  Regular rhythm.  Nontender abdomen. Musculoskeletal: No synovitis hands or knees.  Shoulders move well.  5/5 power.  Hands without infarcts or ischemic changes in the fingers  Assessment: Systemic medium and small vessel vasculitis.  Now progressive despite prednisone 40 mg and Cytoxan 50.  Pending IV Cytoxan at [facility] vasculitis clinic Significant loss of blood flow to the toes of the right foot with resultant  ischemia Underlying connective tissue disease with prior uveitis, mesenteric adenitis, inflammatory arthritis  Recommendations: Boost steroid to 120 mg IV Solu-Medrol a day while in the hospital. Pending amputation surgery on the right foot After surgery, could get her IV Cytoxan as scheduled at the [facility] vasculitis clinic. To be thorough, please order chest x-ray, urinalysis, lupus anticoagulant, anticardiolipin antibodies.  Make sure there is no other manifestation of her vasculitis.  She is previously been antibody negative both ANCA and ANA.  Liliane Bade W 05/19/2020, 12:48 PM

## 2020-05-19 NOTE — Progress Notes (Signed)
Called floor RN for report on patient for LE angoigram. Pt still having periods and no pregnancy test seend for this admission. Order placed, RN to collect urine pregnancy. Awaiting callback to transport patient to preprocedure

## 2020-05-19 NOTE — Progress Notes (Signed)
PROGRESS NOTE    Regina Carpenter  BJS:[id number] DOB: 06/19/1976 DOA: 05/16/2020 PCP: Emmaline Kluver., MD   Brief Narrative:  HPI: Regina Carpenter is a 44 y.o. female with medical history significant of rheumatoid arthritis, polyarteritis vasculitis, chronic bilateral foot wounds who presents to the emergency department for concern of worsening wound of the bilateral feet.  The patient states that she has known severe vasculopathy and over the past several months her lesions started as painful ulcers on multiple toes that is now progressed into a gangrenous state.  The wounds have become increasingly painful and had some foul-smelling drainage from the area as well.  The patient was at the wound care center today and on presentation there the patient was sent here for further evaluation  Patient denies any fevers or chills denies any weight loss, denies any other systemic symptoms.  Her only complaints are pain in bilateral lower extremities.  On review of her rheumatology notes from Dr. Jefm Bryant she has known systemic vasculitis and no ischemic disease of the digits on the right leg.  She also has a known large 3 to 4 cm ulcer on the right lateral leg.  She states these ulcers have been worsening in severity and pain.  On my evaluation in the ED patient is resting comfortably in bed.  She is in minimal distress.  On interview it appears that patient was previously on disease modifying agents such as methotrexate however has been taken off by her rheumatologist.  She is currently just on prednisone.  8/14: Patient seen and examined.  Some issues with pain control overnight.  Control achieved with Dilaudid.  Seen by podiatry and vascular surgery.  Plan for angiogram next week.  8/15: Patient seen and examined.  Improved pain control.  Remains on IV antibiotics.  Pending angiography and further surgical intervention.  No surgical recommendations today  8/16: Patient seen and examined.  N.p.o.  for angiography today.  Remains on IV antibiotics.   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)   Rheumatoid arthritis (HCC)   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HCC)   Essential hypertension   Smoking  Wound infection in the setting of a systemic vasculitis Sepsis secondary to above, improved Lactic acidosis secondary to above Patient meets sepsis criteria with leukocytosis of 19.8, tachycardia, elevated lactic acid Presumptive source is wound infection Vascular surgery contacted from ED Podiatry contacted from ED Patient started on broad-spectrum antibiotics in the emergency department IV fluids given MRSA screen positive otherwise all cultures no growth to date Plan: Continue IV vancomycin, pharmacy dosing Continue Rocephin 2 g every 24 hours Continue Flagyl 500 mg IV every 8 hours Follow blood cultures, no growth to date Titrate antibiotics as appropriate Vascular consult, angiography today Podiatry consult, surgical plan pending angiography Pain control as needed Rheumatology consulted.  Appreciate recommendations from Dr. Jefm Bryant  Rheumatoid arthritis Systemic vasculitis Patient known to Dr. Jefm Bryant Recently taken off methotrexate and switched to Cytoxan Plan: Unfortunately Cytoxan not formulary we are unable to acquire.   Dr. Jefm Bryant rheumatologist contacted for recommendations  continue prednisone 5 mg daily  Hypertension Amlodipine 2.5 mg daily per home dose   DVT prophylaxis: Lovenox Code Status: Full Family Communication: None today Disposition Plan: Status is: Inpatient  Remains inpatient appropriate because:Inpatient level of care appropriate due to severity of illness   Dispo: The patient is from: Home  Anticipated d/c is to: Home              Anticipated d/c date is: > 3 days              Patient currently is not medically stable to d/c.  Patient undergoing angiography with vascular  surgery today.  Podiatry to follow-up.  Suspect patient may require further surgical intervention.  Also called consultation rheumatology for recommendations.  Consultants:   Vascular surgery  Podiatry  Procedures:   None  Antimicrobials:   Vancomycin  Rocephin  Flagyl   Subjective: Patient seen and examined.  Endorsing pain in the right foot.  Objective: Vitals:   05/19/20 1115 05/19/20 1130 05/19/20 1146 05/19/20 1254  BP: 114/85 118/81 114/84 (!) 129/95  Pulse: (!) 102 (!) 101 96 88  Resp: 18 17 17 16   Temp:   98.2 F (36.8 C) 98.2 F (36.8 C)  TempSrc:   Oral Oral  SpO2: 96% 96% 97% 100%  Weight:      Height:        Intake/Output Summary (Last 24 hours) at 05/19/2020 1258 Last data filed at 05/18/2020 1354 Gross per 24 hour  Intake 240 ml  Output --  Net 240 ml   Filed Weights   05/16/20 1139 05/16/20 2030  Weight: 83 kg 83.9 kg    Examination:  General: No apparent distress, patient appears well HEENT: Normocephalic, atraumatic Neck, supple, trachea midline, no tenderness Heart: Regular rate and rhythm, S1/S2 normal, no murmurs Lungs: Clear to auscultation bilaterally, no adventitious sounds, normal work of breathing Abdomen: Soft, nontender, nondistended, positive bowel sounds Extremities: Right second toe ischemic.  30 palpable pulses.  No significant edema.  4 cm ulcer on right lateral leg.  1 cm lesion on right fourth toe.  1 cm raised lesion on left second toe. Skin: No rashes or lesions, normal color Neurologic: Cranial nerves grossly intact, sensation intact, alert and oriented x3 Psychiatric: Normal affect     Data Reviewed: I have personally reviewed following labs and imaging studies  CBC: Recent Labs  Lab 05/16/20 1141 05/17/20 0556 05/19/20 0340  WBC 19.8* 16.8* 11.4*  NEUTROABS 17.2*  --  5.8  HGB 11.7* 10.4* 9.8*  HCT 37.3 36.0 32.2*  MCV 79.0* 81.8 78.7*  PLT 403* 347 161   Basic Metabolic Panel: Recent Labs  Lab  05/16/20 1141 05/17/20 0556 05/18/20 0523 05/19/20 0340  NA 135 137  --  138  K 4.2 3.7  --  3.8  CL 100 103  --  103  CO2 25 25  --  24  GLUCOSE 177* 81  --  79  BUN 9 9  --  7  CREATININE 0.73 0.78 0.64 0.75  CALCIUM 9.6 9.2  --  8.6*   GFR: Estimated Creatinine Clearance: 96.1 mL/min (by C-G formula based on SCr of 0.75 mg/dL). Liver Function Tests: Recent Labs  Lab 05/16/20 1141 05/17/20 0556  AST 17 13*  ALT 18 15  ALKPHOS 64 57  BILITOT 0.6 0.7  PROT 7.8 7.5  ALBUMIN 3.6 3.5   No results for input(s): LIPASE, AMYLASE in the last 168 hours. No results for input(s): AMMONIA in the last 168 hours. Coagulation Profile: Recent Labs  Lab 05/17/20 0556  INR 1.0   Cardiac Enzymes: No results for input(s): CKTOTAL, CKMB, CKMBINDEX, TROPONINI in the last 168 hours. BNP (last 3 results) No results for input(s): PROBNP in the last 8760 hours. HbA1C: No results for input(s): HGBA1C in the  last 72 hours. CBG: No results for input(s): GLUCAP in the last 168 hours. Lipid Profile: No results for input(s): CHOL, HDL, LDLCALC, TRIG, CHOLHDL, LDLDIRECT in the last 72 hours. Thyroid Function Tests: No results for input(s): TSH, T4TOTAL, FREET4, T3FREE, THYROIDAB in the last 72 hours. Anemia Panel: No results for input(s): VITAMINB12, FOLATE, FERRITIN, TIBC, IRON, RETICCTPCT in the last 72 hours. Sepsis Labs: Recent Labs  Lab 05/16/20 1141 05/16/20 1544 05/17/20 0556  PROCALCITON  --   --  <0.10  LATICACIDVEN 2.2* 1.2  --     Recent Results (from the past 240 hour(s))  Blood culture (routine x 2)     Status: None (Preliminary result)   Collection Time: 05/16/20  3:49 PM   Specimen: BLOOD  Result Value Ref Range Status   Specimen Description BLOOD LEFT ANTECUBITAL  Final   Special Requests   Final    BOTTLES DRAWN AEROBIC AND ANAEROBIC Blood Culture results may not be optimal due to an inadequate volume of blood received in culture bottles   Culture   Final    NO  GROWTH 3 DAYS Performed at [facility], [address] [place], [place] [address]    Report Status PENDING  Incomplete  Blood culture (routine x 2)     Status: None (Preliminary result)   Collection Time: 05/16/20  3:54 PM   Specimen: BLOOD  Result Value Ref Range Status   Specimen Description BLOOD BLOOD RIGHT WRIST  Final   Special Requests   Final    BOTTLES DRAWN AEROBIC AND ANAEROBIC Blood Culture adequate volume   Culture   Final    NO GROWTH 3 DAYS Performed at [facility], [address] [place], [place] [address]    Report Status PENDING  Incomplete  SARS Coronavirus 2 by RT PCR (hospital order, performed in [place] hospital lab) Nasopharyngeal Nasopharyngeal Swab     Status: None   Collection Time: 05/16/20  5:46 PM   Specimen: Nasopharyngeal Swab  Result Value Ref Range Status   SARS Coronavirus 2 NEGATIVE NEGATIVE Final    Comment: (NOTE) SARS-CoV-2 target nucleic acids are NOT DETECTED.  The SARS-CoV-2 RNA is generally detectable in upper and lower respiratory specimens during the acute phase of infection. The lowest concentration of SARS-CoV-2 viral copies this assay can detect is 250 copies / mL. A negative result does not preclude SARS-CoV-2 infection and should not be used as the sole basis for treatment or other patient management decisions.  A negative result may occur with improper specimen collection / handling, submission of specimen other than nasopharyngeal swab, presence of viral mutation(s) within the areas targeted by this assay, and inadequate number of viral copies (<250 copies / mL). A negative result must be combined with clinical observations, patient history, and epidemiological information.  Fact Sheet for Patients:   StrictlyIdeas.no  Fact Sheet for Healthcare Providers: BankingDealers.co.za  This test is not yet approved or  cleared by the [place] FDA and has  been authorized for detection and/or diagnosis of SARS-CoV-2 by FDA under an Emergency Use Authorization (EUA).  This EUA will remain in effect (meaning this test can be used) for the duration of the COVID-19 declaration under Section 564(b)(1) of the Act, 21 U.S.C. section 360bbb-3(b)(1), unless the authorization is terminated or revoked sooner.  Performed at [facility], [address] [place],  [address]   Culture, blood (single)     Status: None (Preliminary result)   Collection Time: 05/16/20  8:52 PM   Specimen: BLOOD  Result Value Ref Range Status   Specimen Description BLOOD RIGHT ANTECUBITAL  Final   Special Requests   Final    BOTTLES DRAWN AEROBIC AND ANAEROBIC Blood Culture adequate volume   Culture   Final    NO GROWTH 3 DAYS Performed at [facility], [address] [place], [place] [address]    Report Status PENDING  Incomplete  Surgical PCR screen     Status: Abnormal   Collection Time: 05/19/20  2:45 AM   Specimen: Nasal Mucosa; Nasal Swab  Result Value Ref Range Status   MRSA, PCR POSITIVE (A) NEGATIVE Final    Comment: RESULT CALLED TO, READ BACK BY AND VERIFIED WITH: TINA POTEAT 05/19/20 AT 0436 HS    Staphylococcus aureus POSITIVE (A) NEGATIVE Final    Comment: (NOTE) The Xpert SA Assay (FDA approved for NASAL specimens in patients 8 years of age and older), is one component of a comprehensive surveillance program. It is not intended to diagnose infection nor to guide or monitor treatment. Performed at [facility], [address], [place], [place] [address]          Radiology Studies: PERIPHERAL VASCULAR CATHETERIZATION  Result Date: 05/19/2020 See op note       Scheduled Meds: . amLODipine  5 mg Oral Daily  . cyclophosphamide  50 mg Oral Daily  . enoxaparin (LOVENOX) injection  40 mg Subcutaneous Q24H  . fentaNYL      . folic acid  1 mg Oral Daily  . gabapentin  300 mg Oral TID  . midazolam       . mupirocin ointment  1 application Nasal BID  . nicotine  7 mg Transdermal Daily  . predniSONE  5 mg Oral Q breakfast   Continuous Infusions: . sodium chloride 75 mL/hr at 05/19/20 0456  . cefTRIAXone (ROCEPHIN)  IV 2 g (05/18/20 2217)  . lactated ringers     And  . lactated ringers    . metronidazole 500 mg (05/19/20 0500)  . vancomycin 1,000 mg (05/19/20 0025)     LOS: 3 days    Time spent: 25 minutes    Sidney Ace, MD [facility] Pager [phone]  If 7PM-7AM, please contact night-coverage 05/19/2020, 12:58 PM

## 2020-05-19 NOTE — Op Note (Signed)
[facility]  Percutaneous Study/Intervention Procedural Note   Date of Surgery: 05/19/2020  Surgeon(s):Yazmen Briones    Assistants:none  Pre-operative Diagnosis: Gangrenous toes bilateral lower extremities  Post-operative diagnosis:  Same  Procedure(s) Performed:             1.  Ultrasound guidance for vascular access left femoral artery             2.  Catheter placement into right SFA from left femoral approach             3.  Aortogram and selective bilateral lower extremity angiograms             4.  StarClose closure device left femoral artery  EBL: 5 cc  Contrast: 45 cc  Fluoro Time: 2.4 minutes  Moderate Conscious Sedation Time: approximately 20 minutes using 3 mg of Versed and 75 mcg of Fentanyl              Indications:  Patient is a 44 y.o.female with gangrenous toes on both feet. The patient is brought in for angiography for further evaluation and potential treatment.  Due to the limb threatening nature of the situation, angiogram was performed for attempted limb salvage. The patient is aware that if the procedure fails, amputation would be expected.  The patient also understands that even with successful revascularization, amputation may still be required due to the severity of the situation.  Risks and benefits are discussed and informed consent is obtained.   Procedure:  The patient was identified and appropriate procedural time out was performed.  The patient was then placed supine on the table and prepped and draped in the usual sterile fashion. Moderate conscious sedation was administered during a face to face encounter with the patient throughout the procedure with my supervision of the RN administering medicines and monitoring the patient's vital signs, pulse oximetry, telemetry and mental status throughout from the start of the procedure until the patient was taken to the recovery room. Ultrasound was used to evaluate the left common femoral  artery.  It was patent .  A digital ultrasound image was acquired.  A Seldinger needle was used to access the left common femoral artery under direct ultrasound guidance and a permanent image was performed.  A 0.035 J wire was advanced without resistance and a 5Fr sheath was placed.  Pigtail catheter was placed into the aorta and an AP aortogram was performed. This demonstrated normal aorta and iliac segments without significant stenosis.  The renal arteries were not well seen but this may have been due to catheter position.  I then crossed the aortic bifurcation and advanced to the right femoral head.  After the initial image of the femoral bifurcation which was normal, I advanced into the proximal to mid right SFA to opacify distally.  Selective right lower extremity angiogram was then performed. This demonstrated widely patent right SFA, popliteal artery, with normal tibioperoneal trunk and a typical tibial trifurcation.  All 3 vessels were patent down to the level of the ankle where the peroneal artery and the posterior tibial arteries abruptly occluded without distal reconstitution.  The anterior tibial artery was continuous into the foot and filled the midfoot quite well, but there was very sluggish filling into the toes.  No endovascular intervention was required on the right leg.  I elected to go ahead and image the left lower extremity through the left femoral sheath. This demonstrated a widely patent left common femoral artery, profunda femoris  artery, superficial femoral artery, and popliteal artery.  There was a medial and proximal takeoff of the anterior tibial artery just above the knee and this was then continuous into the foot filling the midfoot quite well, but again there was very sluggish filling of the toes.  Also, the peroneal artery and posterior tibial arteries abruptly occluded at the level of the ankle without distal reconstitution. I elected to terminate the procedure. The sheath was  removed and StarClose closure device was deployed in the left femoral artery with excellent hemostatic result. The patient was taken to the recovery room in stable condition having tolerated the procedure well.  Findings:               Aortogram:  Renal arteries not well seen but this may have been due to catheter position.  The aorta and iliac arteries were widely patent without significant stenosis             Right lower Extremity:  This demonstrated widely patent right common femoral artery, profunda femoris artery, SFA, popliteal artery, with normal tibioperoneal trunk and a typical tibial trifurcation.  All 3 vessels were patent down to the level of the ankle where the peroneal artery and the posterior tibial arteries abruptly occluded without distal reconstitution.  The anterior tibial artery was continuous into the foot and filled the midfoot quite well, but there was very sluggish filling into the toes  Left lower extremity: This demonstrated a widely patent left common femoral artery, profunda femoris artery, superficial femoral artery, and popliteal artery.  There was a medial and proximal takeoff of the anterior tibial artery just above the knee and this was then continuous into the foot filling the midfoot quite well, but again there was very sluggish filling of the toes.  Also, the peroneal artery and posterior tibial arteries abruptly occluded at the level of the ankle without distal reconstitution.   Disposition: Patient was taken to the recovery room in stable condition having tolerated the procedure well.  Complications: None  Leotis Pain 05/19/2020 10:55 AM   This note was created with Dragon Medical transcription system. Any errors in dictation are purely unintentional.

## 2020-05-20 ENCOUNTER — Encounter
Admission: EM | Disposition: A | Discharge status: Home Health | Payer: Self-pay | Source: Home / Self Care | Attending: Internal Medicine

## 2020-05-20 ENCOUNTER — Inpatient Hospital Stay: Payer: Medicaid Other | Admitting: Anesthesiology

## 2020-05-20 ENCOUNTER — Inpatient Hospital Stay: Payer: Medicaid Other

## 2020-05-20 ENCOUNTER — Encounter: Payer: Self-pay | Admitting: Vascular Surgery

## 2020-05-20 HISTORY — PX: TRANSMETATARSAL AMPUTATION: SHX6197

## 2020-05-20 LAB — BASIC METABOLIC PANEL
Anion gap: 7 (ref 5–15)
BUN: 10 mg/dL (ref 6–20)
CO2: 23 mmol/L (ref 22–32)
Calcium: 8.5 mg/dL — ABNORMAL LOW (ref 8.9–10.3)
Chloride: 106 mmol/L (ref 98–111)
Creatinine, Ser: 0.66 mg/dL (ref 0.44–1.00)
GFR calc Af Amer: >60 mL/min (ref >60)
GFR calc non Af Amer: >60 mL/min (ref >60)
Glucose, Bld: 86 mg/dL (ref 70–99)
Potassium: 3.6 mmol/L (ref 3.5–5.1)
Sodium: 136 mmol/L (ref 135–145)

## 2020-05-20 LAB — CBC WITH DIFFERENTIAL/PLATELET
Abs Immature Granulocytes: 0.07 10*3/uL (ref 0.00–0.07)
Basophils Absolute: 0.1 10*3/uL (ref 0.0–0.1)
Basophils Relative: 0 %
Eosinophils Absolute: 0.3 10*3/uL (ref 0.0–0.5)
Eosinophils Relative: 2 %
HCT: 31 % — ABNORMAL LOW (ref 36.0–46.0)
Hemoglobin: 9.3 g/dL — ABNORMAL LOW (ref 12.0–15.0)
Immature Granulocytes: 1 %
Lymphocytes Relative: 31 %
Lymphs Abs: 3.6 10*3/uL (ref 0.7–4.0)
MCH: 24.3 pg — ABNORMAL LOW (ref 26.0–34.0)
MCHC: 30 g/dL (ref 30.0–36.0)
MCV: 81.2 fL (ref 80.0–100.0)
Monocytes Absolute: 1 10*3/uL (ref 0.1–1.0)
Monocytes Relative: 9 %
Neutro Abs: 6.6 10*3/uL (ref 1.7–7.7)
Neutrophils Relative %: 57 %
Platelets: 297 10*3/uL (ref 150–400)
RBC: 3.82 MIL/uL — ABNORMAL LOW (ref 3.87–5.11)
RDW: 18.9 % — ABNORMAL HIGH (ref 11.5–15.5)
WBC: 11.5 10*3/uL — ABNORMAL HIGH (ref 4.0–10.5)
nRBC: 0 % (ref 0.0–0.2)

## 2020-05-20 LAB — URINE DRUG SCREEN, QUALITATIVE (ARMC ONLY)
Amphetamines, Ur Screen: NOT DETECTED
Barbiturates, Ur Screen: NOT DETECTED
Benzodiazepine, Ur Scrn: NOT DETECTED
Cannabinoid 50 Ng, Ur ~~LOC~~: NOT DETECTED
Cocaine Metabolite,Ur ~~LOC~~: NOT DETECTED
MDMA (Ecstasy)Ur Screen: NOT DETECTED
Methadone Scn, Ur: NOT DETECTED
Opiate, Ur Screen: NOT DETECTED
Phencyclidine (PCP) Ur S: NOT DETECTED
Tricyclic, Ur Screen: NOT DETECTED

## 2020-05-20 LAB — URINALYSIS, COMPLETE (UACMP) WITH MICROSCOPIC
Bacteria, UA: NONE SEEN
Bilirubin Urine: NEGATIVE
Glucose, UA: NEGATIVE mg/dL
Ketones, ur: NEGATIVE mg/dL
Leukocytes,Ua: NEGATIVE
Nitrite: NEGATIVE
Protein, ur: NEGATIVE mg/dL
Specific Gravity, Urine: 1.01 (ref 1.005–1.030)
pH: 7 (ref 5.0–8.0)

## 2020-05-20 SURGERY — AMPUTATION, FOOT, TRANSMETATARSAL
Anesthesia: General | Site: Toe | Laterality: Right

## 2020-05-20 MED ORDER — METHYLPREDNISOLONE SODIUM SUCC 125 MG IJ SOLR
INTRAMUSCULAR | Status: AC
  Administered 2020-05-20: 125 mg via INTRAVENOUS
  Filled 2020-05-20: qty 2

## 2020-05-20 MED ORDER — PROPOFOL 10 MG/ML IV BOLUS
INTRAVENOUS | Status: DC | PRN
  Administered 2020-05-20: 200 mg via INTRAVENOUS
  Administered 2020-05-20: 30 mg via INTRAVENOUS

## 2020-05-20 MED ORDER — ACETAMINOPHEN 10 MG/ML IV SOLN
INTRAVENOUS | Status: DC | PRN
  Administered 2020-05-20: 1000 mg via INTRAVENOUS

## 2020-05-20 MED ORDER — PROPOFOL 10 MG/ML IV BOLUS
INTRAVENOUS | Status: AC
  Filled 2020-05-20: qty 20

## 2020-05-20 MED ORDER — MIDAZOLAM HCL 2 MG/2ML IJ SOLN
INTRAMUSCULAR | Status: DC | PRN
  Administered 2020-05-20: 2 mg via INTRAVENOUS

## 2020-05-20 MED ORDER — LIDOCAINE HCL (PF) 2 % IJ SOLN
INTRAMUSCULAR | Status: AC
  Filled 2020-05-20: qty 5

## 2020-05-20 MED ORDER — OXYCODONE HCL 5 MG/5ML PO SOLN: 5.0000 mg | Freq: Once | ORAL | Status: DC | PRN

## 2020-05-20 MED ORDER — METHYLPREDNISOLONE SODIUM SUCC 125 MG IJ SOLR
125.0000 mg | Freq: Every day | INTRAMUSCULAR | Status: DC
  Administered 2020-05-21 – 2020-05-24 (×4): 125 mg via INTRAVENOUS
  Filled 2020-05-20 (×4): qty 2

## 2020-05-20 MED ORDER — MIDAZOLAM HCL 2 MG/2ML IJ SOLN
INTRAMUSCULAR | Status: AC
  Filled 2020-05-20: qty 2

## 2020-05-20 MED ORDER — FENTANYL CITRATE (PF) 100 MCG/2ML IJ SOLN: 25.0000 ug | INTRAMUSCULAR | Status: DC | PRN

## 2020-05-20 MED ORDER — OXYCODONE HCL 5 MG PO TABS
10.0000 mg | ORAL_TABLET | ORAL | Status: DC | PRN
  Administered 2020-05-20 – 2020-05-24 (×16): 10 mg via ORAL
  Filled 2020-05-20 (×16): qty 2

## 2020-05-20 MED ORDER — OXYCODONE HCL 5 MG PO TABS: 5.0000 mg | ORAL_TABLET | Freq: Once | ORAL | Status: DC | PRN

## 2020-05-20 MED ORDER — BUPIVACAINE HCL (PF) 0.5 % IJ SOLN
INTRAMUSCULAR | Status: DC | PRN
  Administered 2020-05-20: 30 mL

## 2020-05-20 MED ORDER — ONDANSETRON HCL 4 MG/2ML IJ SOLN
INTRAMUSCULAR | Status: DC | PRN
  Administered 2020-05-20: 4 mg via INTRAVENOUS

## 2020-05-20 MED ORDER — FENTANYL CITRATE (PF) 100 MCG/2ML IJ SOLN
INTRAMUSCULAR | Status: AC
  Filled 2020-05-20: qty 2

## 2020-05-20 MED ORDER — PHENYLEPHRINE HCL (PRESSORS) 10 MG/ML IV SOLN
INTRAVENOUS | Status: DC | PRN
  Administered 2020-05-20: 200 ug via INTRAVENOUS
  Administered 2020-05-20: 100 ug via INTRAVENOUS
  Administered 2020-05-20: 200 ug via INTRAVENOUS

## 2020-05-20 MED ORDER — DEXMEDETOMIDINE HCL IN NACL 80 MCG/20ML IV SOLN
INTRAVENOUS | Status: AC
  Filled 2020-05-20: qty 20

## 2020-05-20 MED ORDER — PROMETHAZINE HCL 25 MG/ML IJ SOLN
INTRAMUSCULAR | Status: AC
  Administered 2020-05-20: 12.5 mg via INTRAVENOUS
  Filled 2020-05-20: qty 1

## 2020-05-20 MED ORDER — PROMETHAZINE HCL 25 MG/ML IJ SOLN
12.5000 mg | Freq: Four times a day (QID) | INTRAMUSCULAR | Status: DC | PRN
  Administered 2020-05-20: 12.5 mg via INTRAVENOUS

## 2020-05-20 MED ORDER — DEXAMETHASONE SODIUM PHOSPHATE 10 MG/ML IJ SOLN
INTRAMUSCULAR | Status: DC | PRN
  Administered 2020-05-20: 10 mg via INTRAVENOUS

## 2020-05-20 MED ORDER — HYDROMORPHONE HCL 1 MG/ML IJ SOLN
INTRAMUSCULAR | Status: DC | PRN
  Administered 2020-05-20 (×2): .5 mg via INTRAVENOUS

## 2020-05-20 MED ORDER — BUPIVACAINE HCL (PF) 0.5 % IJ SOLN
INTRAMUSCULAR | Status: AC
  Filled 2020-05-20: qty 30

## 2020-05-20 MED ORDER — ACETAMINOPHEN 10 MG/ML IV SOLN
INTRAVENOUS | Status: AC
  Filled 2020-05-20: qty 100

## 2020-05-20 MED ORDER — HYDROMORPHONE HCL 1 MG/ML IJ SOLN
INTRAMUSCULAR | Status: AC
  Filled 2020-05-20: qty 1

## 2020-05-20 MED ORDER — FENTANYL CITRATE (PF) 100 MCG/2ML IJ SOLN
INTRAMUSCULAR | Status: DC | PRN
  Administered 2020-05-20 (×2): 50 ug via INTRAVENOUS

## 2020-05-20 MED ORDER — LIDOCAINE HCL (CARDIAC) PF 100 MG/5ML IV SOSY
PREFILLED_SYRINGE | INTRAVENOUS | Status: DC | PRN
  Administered 2020-05-20: 50 mg via INTRAVENOUS

## 2020-05-20 SURGICAL SUPPLY — 49 items
"PENCIL ELECTRO HAND CTR " (MISCELLANEOUS) ×1 IMPLANT
BLADE MED AGGRESSIVE (BLADE) ×1 IMPLANT
BNDG COHESIVE 4X5 TAN STRL (GAUZE/BANDAGES/DRESSINGS) ×2 IMPLANT
BNDG ELASTIC 4X5.8 VLCR NS LF (GAUZE/BANDAGES/DRESSINGS) ×2 IMPLANT
BNDG ESMARK 4X12 TAN STRL LF (GAUZE/BANDAGES/DRESSINGS) ×2 IMPLANT
BNDG GAUZE 4.5X4.1 6PLY STRL (MISCELLANEOUS) ×2 IMPLANT
CANISTER SUCT 1200ML W/VALVE (MISCELLANEOUS) ×2 IMPLANT
COVER WAND RF STERILE (DRAPES) ×2 IMPLANT
CUFF TOURN SGL QUICK 12 (TOURNIQUET CUFF) IMPLANT
CUFF TOURN SGL QUICK 18X4 (TOURNIQUET CUFF) ×1 IMPLANT
DRAIN PENROSE 1/4X12 LTX STRL (WOUND CARE) ×2 IMPLANT
DRAPE FLUOR MINI C-ARM 54X84 (DRAPES) ×1 IMPLANT
DRSG GAUZE FLUFF 36X18 (GAUZE/BANDAGES/DRESSINGS) ×2 IMPLANT
DURAPREP 26ML APPLICATOR (WOUND CARE) ×1 IMPLANT
ELECT REM PT RETURN 9FT ADLT (ELECTROSURGICAL) ×2
ELECTRODE REM PT RTRN 9FT ADLT (ELECTROSURGICAL) ×1 IMPLANT
GAUZE SPONGE 4X4 12PLY STRL (GAUZE/BANDAGES/DRESSINGS) ×2 IMPLANT
GAUZE XEROFORM 1X8 LF (GAUZE/BANDAGES/DRESSINGS) ×2 IMPLANT
GLOVE BIO SURGEON STRL SZ7.5 (GLOVE) ×2 IMPLANT
GLOVE INDICATOR 8.0 STRL GRN (GLOVE) ×2 IMPLANT
GOWN STRL REUS W/ TWL LRG LVL3 (GOWN DISPOSABLE) ×2 IMPLANT
GOWN STRL REUS W/TWL LRG LVL3 (GOWN DISPOSABLE) ×2
HANDLE YANKAUER SUCT BULB TIP (MISCELLANEOUS) ×2 IMPLANT
HANDPIECE VERSAJET DEBRIDEMENT (MISCELLANEOUS) ×1 IMPLANT
KIT TURNOVER KIT A (KITS) ×2 IMPLANT
LABEL OR SOLS (LABEL) ×2 IMPLANT
NDL SAFETY ECLIPSE 18X1.5 (NEEDLE) ×1 IMPLANT
NEEDLE HYPO 18GX1.5 SHARP (NEEDLE) ×1
NS IRRIG 500ML POUR BTL (IV SOLUTION) ×3 IMPLANT
PACK EXTREMITY (MISCELLANEOUS) ×2 IMPLANT
PAD ABD DERMACEA PRESS 5X9 (GAUZE/BANDAGES/DRESSINGS) ×2 IMPLANT
PENCIL ELECTRO HAND CTR (MISCELLANEOUS) ×2 IMPLANT
SOL .9 NS 3000ML IRR  AL (IV SOLUTION)
SOL .9 NS 3000ML IRR UROMATIC (IV SOLUTION) ×1 IMPLANT
SOL PREP PVP 2OZ (MISCELLANEOUS) ×4
SOLUTION PREP PVP 2OZ (MISCELLANEOUS) ×1 IMPLANT
SPONGE LAP 18X18 RF (DISPOSABLE) ×2 IMPLANT
STOCKINETTE M/LG 89821 (MISCELLANEOUS) ×2 IMPLANT
STOCKINETTE STRL 6IN 960660 (GAUZE/BANDAGES/DRESSINGS) ×1 IMPLANT
STRAP SAFETY 5IN WIDE (MISCELLANEOUS) ×2 IMPLANT
SUT ETHILON 4-0 (SUTURE) ×1
SUT ETHILON 4-0 FS2 18XMFL BLK (SUTURE) ×1
SUT VIC AB 2-0 SH 27 (SUTURE) ×1
SUT VIC AB 2-0 SH 27XBRD (SUTURE) IMPLANT
SUT VIC AB 4-0 FS2 27 (SUTURE) ×4 IMPLANT
SUT VICRYL AB 3-0 FS1 BRD 27IN (SUTURE) ×1 IMPLANT
SUTURE ETHLN 4-0 FS2 18XMF BLK (SUTURE) ×2 IMPLANT
SYR 10ML LL (SYRINGE) ×4 IMPLANT
TRAY PREP VAG/GEN (MISCELLANEOUS) ×1 IMPLANT

## 2020-05-20 NOTE — Progress Notes (Signed)
Order received from Dr Sreenath to discontinue telemetry °

## 2020-05-20 NOTE — Anesthesia Preprocedure Evaluation (Signed)
Anesthesia Evaluation  Patient identified by MRN, date of birth, ID band Patient awake    Reviewed: Allergy & Precautions, H&P , NPO status , Patient's Chart, lab work & pertinent test results  History of Anesthesia Complications Negative for: history of anesthetic complications  Airway Mallampati: III  TM Distance: >3 FB Neck ROM: full    Dental  (+) Chipped, Poor Dentition, Missing   Pulmonary neg shortness of breath, Current Smoker and Patient abstained from smoking.,    Pulmonary exam normal        Cardiovascular Exercise Tolerance: Good hypertension, (-) angina(-) Past MI and (-) DOE Normal cardiovascular exam     Neuro/Psych negative neurological ROS  negative psych ROS   GI/Hepatic negative GI ROS, Neg liver ROS, neg GERD  ,  Endo/Other  negative endocrine ROS  Renal/GU      Musculoskeletal  (+) Arthritis ,   Abdominal   Peds  Hematology negative hematology ROS (+)   Anesthesia Other Findings Past Medical History: No date: Arthralgia of right knee No date: Arthritis No date: Cellulitis of left leg No date: Collagen vascular disease (HCC) No date: Lymphadenopathy, mesenteric No date: Right hand pain No date: Tobacco abuse  Past Surgical History: No date: denies 05/19/2020: LOWER EXTREMITY ANGIOGRAPHY; Bilateral     Comment:  Procedure: Lower Extremity Angiography;  Surgeon: Algernon Huxley, MD;  Location: [facility];  Service:               Cardiovascular;  Laterality: Bilateral;  BMI    Body Mass Index: 32.98 kg/m      Reproductive/Obstetrics negative OB ROS                             Anesthesia Physical Anesthesia Plan  ASA: III  Anesthesia Plan: General LMA   Post-op Pain Management:    Induction: Intravenous  PONV Risk Score and Plan: Dexamethasone, Ondansetron, Midazolam and Treatment may vary due to age or medical condition  Airway  Management Planned: LMA  Additional Equipment:   Intra-op Plan:   Post-operative Plan: Extubation in OR  Informed Consent: I have reviewed the patients History and Physical, chart, labs and discussed the procedure including the risks, benefits and alternatives for the proposed anesthesia with the patient or authorized representative who has indicated his/her understanding and acceptance.     Dental Advisory Given  Plan Discussed with: Anesthesiologist, CRNA and Surgeon  Anesthesia Plan Comments: (Patient consented for risks of anesthesia including but not limited to:  - adverse reactions to medications - damage to eyes, teeth, lips or other oral mucosa - nerve damage due to positioning  - sore throat or hoarseness - Damage to heart, brain, nerves, lungs, other parts of body or loss of life  Patient voiced understanding.)        Anesthesia Quick Evaluation

## 2020-05-20 NOTE — Progress Notes (Signed)
PROGRESS NOTE    Regina Carpenter  GQB:[id number] DOB: December 20, 1975 DOA: 05/16/2020 PCP: Emmaline Kluver., MD   Brief Narrative:  HPI: Regina Carpenter is a 44 y.o. female with medical history significant of rheumatoid arthritis, polyarteritis vasculitis, chronic bilateral foot wounds who presents to the emergency department for concern of worsening wound of the bilateral feet.  The patient states that she has known severe vasculopathy and over the past several months her lesions started as painful ulcers on multiple toes that is now progressed into a gangrenous state.  The wounds have become increasingly painful and had some foul-smelling drainage from the area as well.  The patient was at the wound care center today and on presentation there the patient was sent here for further evaluation  Patient denies any fevers or chills denies any weight loss, denies any other systemic symptoms.  Her only complaints are pain in bilateral lower extremities.  On review of her rheumatology notes from Dr. Jefm Bryant she has known systemic vasculitis and no ischemic disease of the digits on the right leg.  She also has a known large 3 to 4 cm ulcer on the right lateral leg.  She states these ulcers have been worsening in severity and pain.  On my evaluation in the ED patient is resting comfortably in bed.  She is in minimal distress.  On interview it appears that patient was previously on disease modifying agents such as methotrexate however has been taken off by her rheumatologist.  She is currently just on prednisone.  8/14: Patient seen and examined.  Some issues with pain control overnight.  Control achieved with Dilaudid.  Seen by podiatry and vascular surgery.  Plan for angiogram next week.  8/15: Patient seen and examined.  Improved pain control.  Remains on IV antibiotics.  Pending angiography and further surgical intervention.  No surgical recommendations today  8/16: Patient seen and examined.  N.p.o.  for angiography today.  Remains on IV antibiotics.  8/17: Patient seen and examined.  N.p.o. for podiatry intervention today.  Plan for transmetatarsal amputation.  Seen by rheumatology yesterday.  Medication and work-up changes ordered.  Patient without complaint this morning.  Family member at bedside.  All questions answered.   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)   Rheumatoid arthritis (HCC)   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HCC)   Essential hypertension   Smoking  Wound infection in the setting of a systemic vasculitis Sepsis secondary to above, improved Lactic acidosis secondary to above Patient meets sepsis criteria with leukocytosis of 19.8, tachycardia, elevated lactic acid Presumptive source is wound infection Vascular surgery contacted from ED Podiatry contacted from ED Patient started on broad-spectrum antibiotics in the emergency department IV fluids given MRSA screen positive otherwise all cultures no growth to date 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  Follow surgical cultures Vascular consult, status post angiography.  Small vessel vasculitis Podiatry consult, plan for transmetatarsal amputation today Pain control as needed Rheumatology consulted.  Appreciate recommendations from Dr. Jefm Bryant  Rheumatoid arthritis Systemic vasculitis Patient known to Dr. Jefm Bryant Recently taken off methotrexate and switched to Cytoxan Plan: Unfortunately Cytoxan not formulary we are unable to acquire.   Dr. Jefm Bryant rheumatologist contacted for recommendations  Per rheumatology increase steroids to Solu-Medrol 125  mg daily while in house Check chest x-ray Check UA Check anticardiolipin Check lupus anticoagulant Rheumatology  will evaluate for need for Cytoxan while in hospital  Hypertension Amlodipine 2.5 mg daily per home dose   DVT prophylaxis: Lovenox Code Status: Full Family Communication: None today Disposition Plan: Status is: Inpatient  Remains inpatient appropriate because:Inpatient level of care appropriate due to severity of illness   Dispo: The patient is from: Home              Anticipated d/c is to: Home              Anticipated d/c date is: > 3 days              Patient currently is not medically stable to d/c.  Patient undergoing transmetatarsal amputation today.  Disposition plan pending.  Will likely need PT and OT consults postoperative day #1  Consultants:   Vascular surgery  Podiatry  Procedures:   None  Antimicrobials:   Vancomycin  Rocephin  Flagyl   Subjective: Seen and examined.  Pain in right foot.   Objective: Vitals:   05/20/20 1400 05/20/20 1411 05/20/20 1415 05/20/20 1452  BP:  118/86  124/86  Pulse: (!) 105 (!) 111 (!) 102 91  Resp: 16 16 16 16   Temp:   97.9 F (36.6 C) 98.2 F (36.8 C)  TempSrc:    Oral  SpO2: 94% 96% 97% 93%  Weight:      Height:        Intake/Output Summary (Last 24 hours) at 05/20/2020 1512 Last data filed at 05/20/2020 1323 Gross per 24 hour  Intake 790 ml  Output 50 ml  Net 740 ml   Filed Weights   05/16/20 1139 05/16/20 2030 05/20/20 1140  Weight: 83 kg 83.9 kg 89.9 kg    Examination:  General: No apparent distress, patient appears well HEENT: Normocephalic, atraumatic Neck, supple, trachea midline, no tenderness Heart: Regular rate and rhythm, S1/S2 normal, no murmurs Lungs: Clear to auscultation bilaterally, no adventitious sounds, normal work of breathing Abdomen: Soft, nontender, nondistended, positive bowel sounds Extremities: Right second toe ischemic.  30 palpable pulses.  No significant edema.  4 cm ulcer on right lateral leg.  1 cm lesion on right fourth toe.  1 cm raised lesion on left second  toe. Skin: No rashes or lesions, normal color Neurologic: Cranial nerves grossly intact, sensation intact, alert and oriented x3 Psychiatric: Normal affect      Data Reviewed: I have personally reviewed following labs and imaging studies  CBC: Recent Labs  Lab 05/16/20 1141 05/17/20 0556 05/19/20 0340 05/20/20 0433  WBC 19.8* 16.8* 11.4* 11.5*  NEUTROABS 17.2*  --  5.8 6.6  HGB 11.7* 10.4* 9.8* 9.3*  HCT 37.3 36.0 32.2* 31.0*  MCV 79.0* 81.8 78.7* 81.2  PLT 403* 347 305 400   Basic Metabolic Panel: Recent Labs  Lab 05/16/20 1141 05/17/20 0556 05/18/20 0523 05/19/20 0340 05/20/20 0433  NA 135 137  --  138 136  K 4.2 3.7  --  3.8 3.6  CL 100 103  --  103 106  CO2 25 25  --  24 23  GLUCOSE 177* 81  --  79 86  BUN 9 9  --  7 10  CREATININE 0.73 0.78 0.64 0.75 0.66  CALCIUM 9.6 9.2  --  8.6* 8.5*   GFR: Estimated Creatinine Clearance: 99.5 mL/min (by C-G formula based on SCr of 0.66 mg/dL). Liver Function Tests: Recent Labs  Lab 05/16/20 1141 05/17/20 0556  AST 17 13*  ALT 18 15  ALKPHOS 64 57  BILITOT 0.6 0.7  PROT 7.8 7.5  ALBUMIN 3.6 3.5   No results for input(s): LIPASE, AMYLASE in the last 168 hours. No results for input(s): AMMONIA in the last 168 hours. Coagulation Profile: Recent Labs  Lab 05/17/20 0556  INR 1.0   Cardiac Enzymes: No results for input(s): CKTOTAL, CKMB, CKMBINDEX, TROPONINI in the last 168 hours. BNP (last 3 results) No results for input(s): PROBNP in the last 8760 hours. HbA1C: No results for input(s): HGBA1C in the last 72 hours. CBG: No results for input(s): GLUCAP in the last 168 hours. Lipid Profile: No results for input(s): CHOL, HDL, LDLCALC, TRIG, CHOLHDL, LDLDIRECT in the last 72 hours. Thyroid Function Tests: No results for input(s): TSH, T4TOTAL, FREET4, T3FREE, THYROIDAB in the last 72 hours. Anemia Panel: No results for input(s): VITAMINB12, FOLATE, FERRITIN, TIBC, IRON, RETICCTPCT in the last 72  hours. Sepsis Labs: Recent Labs  Lab 05/16/20 1141 05/16/20 1544 05/17/20 0556  PROCALCITON  --   --  <0.10  LATICACIDVEN 2.2* 1.2  --     Recent Results (from the past 240 hour(s))  Blood culture (routine x 2)     Status: None (Preliminary result)   Collection Time: 05/16/20  3:49 PM   Specimen: BLOOD  Result Value Ref Range Status   Specimen Description BLOOD LEFT ANTECUBITAL  Final   Special Requests   Final    BOTTLES DRAWN AEROBIC AND ANAEROBIC Blood Culture results may not be optimal due to an inadequate volume of blood received in culture bottles   Culture   Final    NO GROWTH 4 DAYS Performed at [facility], [address] [place], [place] [address]    Report Status PENDING  Incomplete  Blood culture (routine x 2)     Status: None (Preliminary result)   Collection Time: 05/16/20  3:54 PM   Specimen: BLOOD  Result Value Ref Range Status   Specimen Description BLOOD BLOOD RIGHT WRIST  Final   Special Requests   Final    BOTTLES DRAWN AEROBIC AND ANAEROBIC Blood Culture adequate volume   Culture   Final    NO GROWTH 4 DAYS Performed at [facility], [address], [place],  [address]    Report Status PENDING  Incomplete  SARS Coronavirus 2 by RT PCR (hospital order, performed in [place] hospital lab) Nasopharyngeal Nasopharyngeal Swab     Status: None   Collection Time: 05/16/20  5:46 PM   Specimen: Nasopharyngeal Swab  Result Value Ref Range Status   SARS Coronavirus 2 NEGATIVE NEGATIVE Final    Comment: (NOTE) SARS-CoV-2 target nucleic acids are NOT DETECTED.  The SARS-CoV-2 RNA is generally detectable in upper and lower respiratory specimens during the acute phase of infection. The lowest concentration of SARS-CoV-2 viral copies this assay can detect is 250 copies / mL. A negative result does not preclude SARS-CoV-2 infection and should not be used as the sole basis for treatment or other patient management decisions.  A  negative result may occur with improper specimen collection / handling, submission of specimen other than nasopharyngeal swab, presence of viral mutation(s) within the areas targeted by this assay, and inadequate number of viral copies (<250 copies / mL). A negative result must be combined with clinical observations, patient history, and epidemiological information.  Fact Sheet for Patients:   StrictlyIdeas.no  Fact Sheet for Healthcare Providers: BankingDealers.co.za  This test is not yet approved or  cleared by the [place] and has been authorized for detection and/or diagnosis of SARS-CoV-2 by FDA under an Emergency Use Authorization (EUA).  This EUA will remain in effect (meaning this test can be used) for the duration of the COVID-19 declaration under Section 564(b)(1) of the Act, 21 U.S.C. section 360bbb-3(b)(1), unless the authorization is terminated or revoked sooner.  Performed at [facility], [address] [place], [place] [address]   Culture, blood (single)     Status: None (Preliminary result)   Collection Time: 05/16/20  8:52 PM   Specimen: BLOOD  Result Value Ref Range Status   Specimen Description BLOOD RIGHT ANTECUBITAL  Final   Special Requests   Final    BOTTLES DRAWN AEROBIC AND ANAEROBIC Blood Culture adequate volume   Culture   Final    NO GROWTH 4 DAYS Performed at [facility], [address], [place], [place] [address]    Report Status PENDING  Incomplete  Surgical PCR screen     Status: Abnormal   Collection Time: 05/19/20  2:45 AM   Specimen: Nasal Mucosa; Nasal Swab  Result Value Ref Range Status   MRSA, PCR POSITIVE (A) NEGATIVE Final    Comment: RESULT CALLED TO, READ BACK BY AND VERIFIED WITH: TINA POTEAT 05/19/20 AT 0436 HS    Staphylococcus aureus POSITIVE (A) NEGATIVE Final    Comment: (NOTE) The Xpert SA Assay (FDA approved for NASAL specimens in patients  43 years of age and older), is one component of a comprehensive surveillance program. It is not intended to diagnose infection nor to guide or monitor treatment. Performed at [facility], [address] [place], [place] [address]          Radiology Studies: PERIPHERAL VASCULAR CATHETERIZATION  Result Date: 05/19/2020 See op note  DG Chest Port 1 View  Result Date: 05/20/2020 CLINICAL DATA:  44 year old female with worsening bilateral chronic foot wounds. Vasculopathy/vasculitis. EXAM: PORTABLE CHEST 1 VIEW COMPARISON:  Chest radiographs 08/14/2019 and earlier. FINDINGS: Portable AP upright view at 0745 hours. Lung volumes and mediastinal contours remain within normal limits. Visualized tracheal air column is within normal limits. Allowing for portable technique the lungs are clear. No pneumothorax or pleural effusion. Negative visible bowel gas pattern and osseous structures. IMPRESSION: Negative portable chest. Electronically Signed   By: Genevie Ann M.D.   On: 05/20/2020 08:09        Scheduled Meds: . amLODipine  5 mg Oral Daily  . cyclophosphamide  50 mg Oral Daily  . enoxaparin (LOVENOX) injection  40 mg Subcutaneous Q24H  . folic acid  1 mg Oral Daily  . gabapentin  300 mg Oral TID  . methylPREDNISolone (SOLU-MEDROL) injection  125 mg Intravenous Daily  . mupirocin ointment  1 application Nasal BID  . nicotine  7 mg Transdermal Daily  . promethazine       Continuous Infusions: . sodium chloride 75 mL/hr at 05/20/20 1212  . cefTRIAXone (ROCEPHIN)  IV 2 g (05/19/20 2225)  . lactated ringers     And  . lactated ringers    . metronidazole 500 mg (05/20/20 0532)  . vancomycin 1,000 mg (05/20/20 0038)     LOS: 4 days    Time spent: 25 minutes    Sidney Ace, MD [facility] Pager [phone]  If 7PM-7AM, please contact night-coverage 05/20/2020, 3:12 PM

## 2020-05-20 NOTE — Op Note (Signed)
Date of operation: May 20, 2020.  Surgeon: [name] D.P.M.  Preoperative diagnosis: Gangrene right forefoot.  Postoperative diagnosis: Same.  Procedure: Transmetatarsal amputation right forefoot.  Anesthesia: LMA.  Hemostasis: None.  Estimated blood loss: 50 cc.  Injectables: 30 cc 0.5% Marcaine plain postoperatively.  Pathology: Right forefoot.  Complications: None apparent.  Operative indications: This is a 44 year old female with a history of significant vasculitis complications affecting the small vessels in her feet.  History of multiple ulcerations.  Recently developed gangrenous changes to toes on her right foot.  Angiogram showed blood flow down to the ankle with some flow dorsally to the midfoot with no significant flow to the toes.  Decision was made for transmetatarsal amputation as her best chance to try to allow for healing of her forefoot.  Operative procedure: Patient was taken to the operating room and placed on the table in the supine position.  Following satisfactory LMA general anesthesia the right foot was prepped and draped in the usual sterile fashion.  A tourniquet was applied to the right lower extremity prior to draping but this was not inflated.  Attention was then directed to the distal aspect of the right foot where an incision was made from medial to lateral across the dorsal aspect of the forefoot just proximal to all ulcerations and toes.  The incision was carried sharply full-thickness down to the level of the bone and then periosteal dissection carried along the metatarsal shaft more proximally.  There was noted to be good healthy bleeding upon initiation of the procedure but this did taper off fairly quickly to some mild oozing after about 5 to 10 min.  A plantar incision was made in similar fashion from lateral to medial and carried sharply down to the level of the bones with dissection carried proximally.  Using a sagittal saw metatarsals one  through 5 were incised and the forefoot was carefully dissected away from the remaining plantar soft tissues and removed off of the operative area and placed on the back table in toto.  Good healthy tissues were noted.  The wound was thoroughly irrigated with sterile saline and bulb syringe.  The deep fascial layers were then closed using 2-0 Vicryl simple interrupted sutures followed by more superficial subcutaneous closure using 3-0 Vicryl simple interrupted sutures followed by skin closure using staples.  30 cc of 0.5% Marcaine plain were then injected dorsally and plantarly as well as between the metatarsals.  Xeroform 4 x 4's ABDs and Kerlix applied to the right foot and the drapes were removed.  A 2nd Kerlix and Ace wrap applied to the right lower extremity.  Patient was awakened having tolerated the procedure and anesthesia well and was transported to the PACU with vital signs stable and in good condition.

## 2020-05-20 NOTE — Interval H&P Note (Signed)
History and Physical Interval Note:  05/20/2020 12:08 PM  Regina Carpenter  has presented today for surgery, with the diagnosis of Gain [place].  The various methods of treatment have been discussed with the patient and family. After consideration of risks, benefits and other options for treatment, the patient has consented to  Procedure(s): TRANSMETATARSAL AMPUTATION (Right) as a surgical intervention.  The patient's history has been reviewed, patient examined, no change in status, stable for surgery.  I have reviewed the patient's chart and labs.  Questions were answered to the patient's satisfaction.     [name]

## 2020-05-20 NOTE — Progress Notes (Signed)
Returned from OR no distress noted VS WDL see flowsheets pt asleep. Will cont to monitor

## 2020-05-20 NOTE — Transfer of Care (Signed)
Immediate Anesthesia Transfer of Care Note  Patient: Evelina Lore  Procedure(s) Performed: TRANSMETATARSAL AMPUTATION (Right Toe)  Patient Location: PACU  Anesthesia Type:General  Level of Consciousness: drowsy  Airway & Oxygen Therapy: Patient Spontanous Breathing and Patient connected to face mask oxygen  Post-op Assessment: Report given to RN and Post -op Vital signs reviewed and stable  Post vital signs: Reviewed and stable  Last Vitals:  Vitals Value Taken Time  BP 109/73 05/20/20 1342  Temp    Pulse 91 05/20/20 1343  Resp 11 05/20/20 1343  SpO2 99 % 05/20/20 1343  Vitals shown include unvalidated device data.  Last Pain:  Vitals:   05/20/20 1140  TempSrc: Oral  PainSc: 0-No pain      Patients Stated Pain Goal: 0 (37/94/44 6190)  Complications: No complications documented.

## 2020-05-21 ENCOUNTER — Encounter: Payer: Self-pay | Admitting: Podiatry

## 2020-05-21 DIAGNOSIS — L97529 Non-pressure chronic ulcer of other part of left foot with unspecified severity: Secondary | ICD-10-CM

## 2020-05-21 DIAGNOSIS — L97519 Non-pressure chronic ulcer of other part of right foot with unspecified severity: Secondary | ICD-10-CM

## 2020-05-21 DIAGNOSIS — I10 Essential (primary) hypertension: Secondary | ICD-10-CM

## 2020-05-21 DIAGNOSIS — M318 Other specified necrotizing vasculopathies: Secondary | ICD-10-CM

## 2020-05-21 LAB — BASIC METABOLIC PANEL
Anion gap: 6 (ref 5–15)
BUN: 10 mg/dL (ref 6–20)
CO2: 24 mmol/L (ref 22–32)
Calcium: 8.5 mg/dL — ABNORMAL LOW (ref 8.9–10.3)
Chloride: 107 mmol/L (ref 98–111)
Creatinine, Ser: 0.53 mg/dL (ref 0.44–1.00)
GFR calc Af Amer: >60 mL/min (ref >60)
GFR calc non Af Amer: >60 mL/min (ref >60)
Glucose, Bld: 115 mg/dL — ABNORMAL HIGH (ref 70–99)
Potassium: 4 mmol/L (ref 3.5–5.1)
Sodium: 137 mmol/L (ref 135–145)

## 2020-05-21 LAB — CULTURE, BLOOD (ROUTINE X 2)
Culture: NO GROWTH
Culture: NO GROWTH
Special Requests: ADEQUATE

## 2020-05-21 LAB — LUPUS ANTICOAGULANT
DRVVT: 41.4 s (ref 0.0–47.0)
PTT Lupus Anticoagulant: 33.8 s (ref 0.0–51.9)
Thrombin Time: 16.3 s (ref 0.0–23.0)
dPT Confirm Ratio: 1.04 Ratio (ref 0.00–1.40)
dPT: 41.9 s (ref 0.0–55.0)

## 2020-05-21 LAB — CBC WITH DIFFERENTIAL/PLATELET
Abs Immature Granulocytes: 0.14 10*3/uL — ABNORMAL HIGH (ref 0.00–0.07)
Basophils Absolute: 0 10*3/uL (ref 0.0–0.1)
Basophils Relative: 0 %
Eosinophils Absolute: 0 10*3/uL (ref 0.0–0.5)
Eosinophils Relative: 0 %
HCT: 29.5 % — ABNORMAL LOW (ref 36.0–46.0)
Hemoglobin: 9.1 g/dL — ABNORMAL LOW (ref 12.0–15.0)
Immature Granulocytes: 1 %
Lymphocytes Relative: 9 %
Lymphs Abs: 1.6 10*3/uL (ref 0.7–4.0)
MCH: 24 pg — ABNORMAL LOW (ref 26.0–34.0)
MCHC: 30.8 g/dL (ref 30.0–36.0)
MCV: 77.8 fL — ABNORMAL LOW (ref 80.0–100.0)
Monocytes Absolute: 1.3 10*3/uL — ABNORMAL HIGH (ref 0.1–1.0)
Monocytes Relative: 7 %
Neutro Abs: 14.2 10*3/uL — ABNORMAL HIGH (ref 1.7–7.7)
Neutrophils Relative %: 83 %
Platelets: 330 10*3/uL (ref 150–400)
RBC: 3.79 MIL/uL — ABNORMAL LOW (ref 3.87–5.11)
RDW: 18.6 % — ABNORMAL HIGH (ref 11.5–15.5)
WBC: 17.2 10*3/uL — ABNORMAL HIGH (ref 4.0–10.5)
nRBC: 0 % (ref 0.0–0.2)

## 2020-05-21 LAB — CULTURE, BLOOD (SINGLE)
Culture: NO GROWTH
Special Requests: ADEQUATE

## 2020-05-21 MED ORDER — CLINDAMYCIN HCL 150 MG PO CAPS
300.0000 mg | ORAL_CAPSULE | Freq: Three times a day (TID) | ORAL | Status: DC
  Administered 2020-05-21 – 2020-05-24 (×8): 300 mg via ORAL
  Filled 2020-05-21 (×10): qty 2

## 2020-05-21 NOTE — Plan of Care (Signed)

## 2020-05-21 NOTE — Anesthesia Postprocedure Evaluation (Signed)
Anesthesia Post Note  Patient: Regina Carpenter  Procedure(s) Performed: TRANSMETATARSAL AMPUTATION (Right Toe)  Patient location during evaluation: PACU Anesthesia Type: General Level of consciousness: awake and alert Pain management: pain level controlled Vital Signs Assessment: post-procedure vital signs reviewed and stable Respiratory status: spontaneous breathing, nonlabored ventilation, respiratory function stable and patient connected to nasal cannula oxygen Cardiovascular status: blood pressure returned to baseline and stable Postop Assessment: no apparent nausea or vomiting Anesthetic complications: no   No complications documented.   Last Vitals:  Vitals:   05/21/20 0451 05/21/20 0727  BP: (!) 94/59 99/68  Pulse: 95 (!) 110  Resp: 16 17  Temp: 37.1 C 36.9 C  SpO2: 98% 99%    Last Pain:  Vitals:   05/21/20 0727  TempSrc: Oral  PainSc:                  Regina Carpenter

## 2020-05-21 NOTE — Progress Notes (Signed)
Progress Note    Regina Carpenter  [facility]:[id number] DOB: 1975/10/31  DOA: 05/16/2020 PCP: Emmaline Kluver., MD      Brief Narrative:    Medical records reviewed and are as summarized below:  Regina Carpenter is a 44 y.o. female with medical history significant ofrheumatoid arthritis, polyarteritis vasculitis, chronic bilateral foot wounds who presents to the emergency department for concern of worsening wound of the bilateral feet. The patient states that she has known severe vasculopathy and over the past several months her lesions started as painful ulcers on multiple toes that is now progressed into a gangrenous state. The wounds have become increasingly painful and had some foul-smelling drainage from the area as well.       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  Discussed with Aldona Bar, pharmacist and podiatrist, Dr. Cleda Mccreedy via secure chat.  Start oral clindamycin. Discontinue IV fluids Analgesics as needed for pain Continue cyclophosphamide and IV steroids    Body mass index is 32.98 kg/m.  Diet Order            Diet regular Room service appropriate? Yes; Fluid consistency: Thin  Diet effective now                        Medications:   . amLODipine  5 mg Oral Daily  . clindamycin  300 mg Oral Q8H  . cyclophosphamide  50 mg Oral Daily  . enoxaparin (LOVENOX) injection  40 mg Subcutaneous Q24H  . folic acid  1 mg Oral Daily  . gabapentin  300 mg Oral TID  . methylPREDNISolone (SOLU-MEDROL) injection  125 mg Intravenous Daily  . mupirocin  ointment  1 application Nasal BID  . nicotine  7 mg Transdermal Daily   Continuous Infusions: . lactated ringers     And  . lactated ringers       Anti-infectives (From admission, onward)   Start     Dose/Rate Route Frequency Ordered Stop   05/21/20 2200  clindamycin (CLEOCIN) capsule 300 mg     Discontinue     300 mg Oral Every 8 hours 05/21/20 1459     05/17/20 1200  vancomycin (VANCOCIN) IVPB 1000 mg/200 mL premix  Status:  Discontinued        1,000 mg 200 mL/hr over 60 Minutes Intravenous Every 12 hours 05/17/20 1100 05/21/20 1458   05/16/20 2200  cefTRIAXone (ROCEPHIN) 2 g in sodium chloride 0.9 % 100 mL IVPB  Status:  Discontinued        2 g 200 mL/hr over 30 Minutes Intravenous Every 24 hours 05/16/20 1718 05/21/20 1458   05/16/20 2200  metroNIDAZOLE (FLAGYL) IVPB 500 mg  Status:  Discontinued        500 mg 100 mL/hr over 60 Minutes Intravenous Every 8 hours 05/16/20 1721 05/21/20 1458   05/16/20 1730  vancomycin (VANCOCIN) IVPB 1000 mg/200 mL premix  Status:  Discontinued        1,000 mg 200 mL/hr over 60 Minutes Intravenous  Once 05/16/20 1718 05/16/20 1802   05/16/20 1630  vancomycin (VANCOREADY) IVPB 1500 mg/300 mL  1,500 mg 150 mL/hr over 120 Minutes Intravenous  Once 05/16/20 1609 05/16/20 2031   05/16/20 1630  piperacillin-tazobactam (ZOSYN) IVPB 3.375 g        3.375 g 12.5 mL/hr over 240 Minutes Intravenous  Once 05/16/20 1609 05/16/20 2016             Family Communication/Anticipated D/C date and plan/Code Status   DVT prophylaxis: enoxaparin (LOVENOX) injection 40 mg Start: 05/16/20 2200     Code Status: Full Code  Family Communication: Plan discussed with patient Disposition Plan:    Status is: Inpatient  Remains inpatient appropriate because:Inpatient level of care appropriate due to severity of illness   Dispo: The patient is from: Home              Anticipated d/c is to: Home              Anticipated d/c date is: 1 day               Patient currently is not medically stable to d/c.           Subjective:   C/o pain in the right foot  Objective:    Vitals:   05/20/20 2358 05/21/20 0451 05/21/20 0727 05/21/20 1221  BP: 118/81 (!) 94/59 99/68 117/76  Pulse: (!) 102 95 (!) 110 (!) 110  Resp: 17 16 17    Temp: 97.6 F (36.4 C) 98.7 F (37.1 C) 98.5 F (36.9 C) 98.5 F (36.9 C)  TempSrc: Oral Oral Oral Oral  SpO2: 98% 98% 99% 100%  Weight:      Height:       No data found.   Intake/Output Summary (Last 24 hours) at 05/21/2020 1459 Last data filed at 05/21/2020 1022 Gross per 24 hour  Intake 240 ml  Output --  Net 240 ml   Filed Weights   05/16/20 1139 05/16/20 2030 05/20/20 1140  Weight: 83 kg 83.9 kg 89.9 kg    Exam:  GEN: NAD SKIN: Warm and dry EYES: EOMI ENT: MMM CV: RRR PULM: CTA B ABD: soft, obese, NT, +BS CNS: AAO x 3, non focal EXT: Dressing on right foot is clean and dry.   Data Reviewed:   I have personally reviewed following labs and imaging studies:  Labs: Labs show the following:   Basic Metabolic Panel: Recent Labs  Lab 05/16/20 1141 05/16/20 1141 05/17/20 0556 05/17/20 0556 05/18/20 0523 05/19/20 0340 05/19/20 0340 05/20/20 0433 05/21/20 0443  NA 135  --  137  --   --  138  --  136 137  K 4.2   < > 3.7   < >  --  3.8   < > 3.6 4.0  CL 100  --  103  --   --  103  --  106 107  CO2 25  --  25  --   --  24  --  23 24  GLUCOSE 177*  --  81  --   --  79  --  86 115*  BUN 9  --  9  --   --  7  --  10 10  CREATININE 0.73   < > 0.78  --  0.64 0.75  --  0.66 0.53  CALCIUM 9.6  --  9.2  --   --  8.6*  --  8.5* 8.5*   < > = values in this interval not displayed.   GFR Estimated Creatinine Clearance: 99.5 mL/min (by C-G formula based  on SCr of 0.53 mg/dL). Liver Function Tests: Recent Labs  Lab 05/16/20 1141 05/17/20 0556  AST 17 13*  ALT 18 15  ALKPHOS 64 57  BILITOT 0.6 0.7  PROT 7.8 7.5  ALBUMIN 3.6 3.5   No results for input(s): LIPASE, AMYLASE  in the last 168 hours. No results for input(s): AMMONIA in the last 168 hours. Coagulation profile Recent Labs  Lab 05/17/20 0556  INR 1.0    CBC: Recent Labs  Lab 05/16/20 1141 05/17/20 0556 05/19/20 0340 05/20/20 0433 05/21/20 0443  WBC 19.8* 16.8* 11.4* 11.5* 17.2*  NEUTROABS 17.2*  --  5.8 6.6 14.2*  HGB 11.7* 10.4* 9.8* 9.3* 9.1*  HCT 37.3 36.0 32.2* 31.0* 29.5*  MCV 79.0* 81.8 78.7* 81.2 77.8*  PLT 403* 347 305 297 330   Cardiac Enzymes: No results for input(s): CKTOTAL, CKMB, CKMBINDEX, TROPONINI in the last 168 hours. BNP (last 3 results) No results for input(s): PROBNP in the last 8760 hours. CBG: No results for input(s): GLUCAP in the last 168 hours. D-Dimer: No results for input(s): DDIMER in the last 72 hours. Hgb A1c: No results for input(s): HGBA1C in the last 72 hours. Lipid Profile: No results for input(s): CHOL, HDL, LDLCALC, TRIG, CHOLHDL, LDLDIRECT in the last 72 hours. Thyroid function studies: No results for input(s): TSH, T4TOTAL, T3FREE, THYROIDAB in the last 72 hours.  Invalid input(s): FREET3 Anemia work up: No results for input(s): VITAMINB12, FOLATE, FERRITIN, TIBC, IRON, RETICCTPCT in the last 72 hours. Sepsis Labs: Recent Labs  Lab 05/16/20 1141 05/16/20 1141 05/16/20 1544 05/17/20 0556 05/19/20 0340 05/20/20 0433 05/21/20 0443  PROCALCITON  --   --   --  <0.10  --   --   --   WBC 19.8*   < >  --  16.8* 11.4* 11.5* 17.2*  LATICACIDVEN 2.2*  --  1.2  --   --   --   --    < > = values in this interval not displayed.    Microbiology Recent Results (from the past 240 hour(s))  Blood culture (routine x 2)     Status: None   Collection Time: 05/16/20  3:49 PM   Specimen: BLOOD  Result Value Ref Range Status   Specimen Description BLOOD LEFT ANTECUBITAL  Final   Special Requests   Final    BOTTLES DRAWN AEROBIC AND ANAEROBIC Blood Culture results may not be optimal due to an inadequate volume of blood received in culture bottles     Culture   Final    NO GROWTH 5 DAYS Performed at [facility], [address] [place], [place] [address]    Report Status 05/21/2020 FINAL  Final  Blood culture (routine x 2)     Status: None   Collection Time: 05/16/20  3:54 PM   Specimen: BLOOD  Result Value Ref Range Status   Specimen Description BLOOD BLOOD RIGHT WRIST  Final   Special Requests   Final    BOTTLES DRAWN AEROBIC AND ANAEROBIC Blood Culture adequate volume   Culture   Final    NO GROWTH 5 DAYS Performed at [facility], [address] [place], [place] [address]    Report Status 05/21/2020 FINAL  Final  SARS Coronavirus 2 by RT PCR (hospital order, performed in [facility] hospital lab) Nasopharyngeal Nasopharyngeal Swab     Status: None   Collection Time: 05/16/20  5:46 PM   Specimen: Nasopharyngeal Swab  Result Value Ref Range Status   SARS  Coronavirus 2 NEGATIVE NEGATIVE Final    Comment: (NOTE) SARS-CoV-2 target nucleic acids are NOT DETECTED.  The SARS-CoV-2 RNA is generally detectable in upper and lower respiratory specimens during the acute phase of infection. The lowest concentration of SARS-CoV-2 viral copies this assay can detect is 250 copies / mL. A negative result does not preclude SARS-CoV-2 infection and should not be used as the sole basis for treatment or other patient management decisions.  A negative result may occur with improper specimen collection / handling, submission of specimen other than nasopharyngeal swab, presence of viral mutation(s) within the areas targeted by this assay, and inadequate number of viral copies (<250 copies / mL). A negative result must be combined with clinical observations, patient history, and epidemiological information.  Fact Sheet for Patients:   StrictlyIdeas.no  Fact Sheet for Healthcare Providers: BankingDealers.co.za  This test is not yet approved or  cleared by the [place]  FDA and has been authorized for detection and/or diagnosis of SARS-CoV-2 by FDA under an Emergency Use Authorization (EUA).  This EUA will remain in effect (meaning this test can be used) for the duration of the COVID-19 declaration under Section 564(b)(1) of the Act, 21 U.S.C. section 360bbb-3(b)(1), unless the authorization is terminated or revoked sooner.  Performed at [facility], [address] [place], [place] [address]   Culture, blood (single)     Status: None   Collection Time: 05/16/20  8:52 PM   Specimen: BLOOD  Result Value Ref Range Status   Specimen Description BLOOD RIGHT ANTECUBITAL  Final   Special Requests   Final    BOTTLES DRAWN AEROBIC AND ANAEROBIC Blood Culture adequate volume   Culture   Final    NO GROWTH 5 DAYS Performed at [facility], [address] [place], [place] [address]    Report Status 05/21/2020 FINAL  Final  Surgical PCR screen     Status: Abnormal   Collection Time: 05/19/20  2:45 AM   Specimen: Nasal Mucosa; Nasal Swab  Result Value Ref Range Status   MRSA, PCR POSITIVE (A) NEGATIVE Final    Comment: RESULT CALLED TO, READ BACK BY AND VERIFIED WITH: TINA POTEAT 05/19/20 AT 0436 HS    Staphylococcus aureus POSITIVE (A) NEGATIVE Final    Comment: (NOTE) The Xpert SA Assay (FDA approved for NASAL specimens in patients 4 years of age and older), is one component of a comprehensive surveillance program. It is not intended to diagnose infection nor to guide or monitor treatment. Performed at [facility], [address], [place],  [address]     Procedures and diagnostic studies:  DG Chest [facility] 1 View  Result Date: 05/20/2020 CLINICAL DATA:  44 year old female with worsening bilateral chronic foot wounds. Vasculopathy/vasculitis. EXAM: PORTABLE CHEST 1 VIEW COMPARISON:  Chest radiographs 08/14/2019 and earlier. FINDINGS: Portable AP upright view at 0745 hours. Lung volumes and mediastinal contours  remain within normal limits. Visualized tracheal air column is within normal limits. Allowing for portable technique the lungs are clear. No pneumothorax or pleural effusion. Negative visible bowel gas pattern and osseous structures. IMPRESSION: Negative portable chest. Electronically Signed   By: Genevie Ann M.D.   On: 05/20/2020 08:09               LOS: 5 days   Regina Carpenter  [facility]   Pager on [email or web address]. If 7PM-7AM, please contact night-coverage at [email or web address]     05/21/2020, 2:59 PM

## 2020-05-21 NOTE — Progress Notes (Signed)
1 Day Post-Op   Subjective/Chief Complaint: Patient seen.  Currently not much pain in her right foot but did have some significant pain overnight.   Objective: Vital signs in last 24 hours: Temp:  [97.2 F (36.2 C)-98.7 F (37.1 C)] 98.5 F (36.9 C) (08/18 1221) Pulse Rate:  [84-111] 110 (08/18 1221) Resp:  [11-18] 17 (08/18 0727) BP: (94-124)/(59-86) 117/76 (08/18 1221) SpO2:  [93 %-100 %] 100 % (08/18 1221) Last BM Date: 05/19/20  Intake/Output from previous day: 08/17 0701 - 08/18 0700 In: 550 [I.V.:450; IV Piggyback:100] Out: 50 [Blood:50] Intake/Output this shift: Total I/O In: 240 [P.O.:240] Out: -   Bandage on the right foot is dry and intact.  Lab Results:  Recent Labs    05/20/20 0433 05/21/20 0443  WBC 11.5* 17.2*  HGB 9.3* 9.1*  HCT 31.0* 29.5*  PLT 297 330   BMET Recent Labs    05/20/20 0433 05/21/20 0443  NA 136 137  K 3.6 4.0  CL 106 107  CO2 23 24  GLUCOSE 86 115*  BUN 10 10  CREATININE 0.66 0.53  CALCIUM 8.5* 8.5*   PT/INR No results for input(s): LABPROT, INR in the last 72 hours. ABG No results for input(s): PHART, HCO3 in the last 72 hours.  Invalid input(s): PCO2, PO2  Studies/Results: DG Chest Port 1 View  Result Date: 05/20/2020 CLINICAL DATA:  44 year old female with worsening bilateral chronic foot wounds. Vasculopathy/vasculitis. EXAM: PORTABLE CHEST 1 VIEW COMPARISON:  Chest radiographs 08/14/2019 and earlier. FINDINGS: Portable AP upright view at 0745 hours. Lung volumes and mediastinal contours remain within normal limits. Visualized tracheal air column is within normal limits. Allowing for portable technique the lungs are clear. No pneumothorax or pleural effusion. Negative visible bowel gas pattern and osseous structures. IMPRESSION: Negative portable chest. Electronically Signed   By: Genevie Ann M.D.   On: 05/20/2020 08:09    Anti-infectives: Anti-infectives (From admission, onward)   Start     Dose/Rate Route Frequency  Ordered Stop   05/17/20 1200  vancomycin (VANCOCIN) IVPB 1000 mg/200 mL premix     Discontinue     1,000 mg 200 mL/hr over 60 Minutes Intravenous Every 12 hours 05/17/20 1100     05/16/20 2200  cefTRIAXone (ROCEPHIN) 2 g in sodium chloride 0.9 % 100 mL IVPB     Discontinue     2 g 200 mL/hr over 30 Minutes Intravenous Every 24 hours 05/16/20 1718     05/16/20 2200  metroNIDAZOLE (FLAGYL) IVPB 500 mg     Discontinue     500 mg 100 mL/hr over 60 Minutes Intravenous Every 8 hours 05/16/20 1721     05/16/20 1730  vancomycin (VANCOCIN) IVPB 1000 mg/200 mL premix  Status:  Discontinued        1,000 mg 200 mL/hr over 60 Minutes Intravenous  Once 05/16/20 1718 05/16/20 1802   05/16/20 1630  vancomycin (VANCOREADY) IVPB 1500 mg/300 mL        1,500 mg 150 mL/hr over 120 Minutes Intravenous  Once 05/16/20 1609 05/16/20 2031   05/16/20 1630  piperacillin-tazobactam (ZOSYN) IVPB 3.375 g        3.375 g 12.5 mL/hr over 240 Minutes Intravenous  Once 05/16/20 1609 05/16/20 2016      Assessment/Plan: s/p Procedure(s): TRANSMETATARSAL AMPUTATION (Right) Assessment: Status post transmetatarsal amputation right foot.   Plan: Dressing left intact.  Plan for dressing change tomorrow for evaluation.  LOS: 5 days    [name] 05/21/2020

## 2020-05-21 NOTE — Consult Note (Addendum)
Pharmacy Antibiotic Note  Regina Carpenter is a 44 y.o. female admitted on 05/16/2020 with cellulitis.  Pharmacy has been consulted for Vancomycin dosing.  8/13 DG right foot and left 5th toe - No definitive radiographic evidence of osteomyelitis.  Patient had angiogram 8/16 indicated patient likely to need amputation. Podiatry following for toe gangrene. Patient had toe amputation on 8/17  8/17 Patient remains afebrile. Increase in WBC today likely due to stress from amputation yesterday. Slightly tachycardic and soft BP.   Plan: Will follow up plan s/p amputation and obtain vanc trough as clinically appropriate depending on planned antibiotic course. Continue vancomycin 1000 mg IV q12h  Height: 5\' 5"  (165.1 cm) Weight: 89.9 kg (198 lb 3.1 oz) IBW/kg (Calculated) : 57  Temp (24hrs), Avg:98 F (36.7 C), Min:97.2 F (36.2 C), Max:98.7 F (37.1 C)  Recent Labs  Lab 05/16/20 1141 05/16/20 1141 05/16/20 1544 05/17/20 0556 05/18/20 0523 05/19/20 0340 05/20/20 0433 05/21/20 0443  WBC 19.8*  --   --  16.8*  --  11.4* 11.5* 17.2*  CREATININE 0.73   < >  --  0.78 0.64 0.75 0.66 0.53  LATICACIDVEN 2.2*  --  1.2  --   --   --   --   --    < > = values in this interval not displayed.    Estimated Creatinine Clearance: 99.5 mL/min (by C-G formula based on SCr of 0.53 mg/dL).    Allergies  Allergen Reactions  . Ibuprofen Other (See Comments)    Pt states that she sweats profusely after taking ibuprofen Other reaction(s): Other (See Comments) "sweating:"  . Sulfasalazine Rash    Antimicrobials this admission: 8/13 Zosyn x 1 Vancomycin 8/13 >>  Dose adjustments this admission: None  Microbiology results: 8/13 BCx: NG COVID negative  Thank you for allowing pharmacy to be a part of this patient's care.  Sherilyn Banker, PharmD Pharmacy Resident  05/21/2020 8:38 AM

## 2020-05-22 LAB — BASIC METABOLIC PANEL
Anion gap: 7 (ref 5–15)
BUN: 12 mg/dL (ref 6–20)
CO2: 24 mmol/L (ref 22–32)
Calcium: 8.6 mg/dL — ABNORMAL LOW (ref 8.9–10.3)
Chloride: 109 mmol/L (ref 98–111)
Creatinine, Ser: 0.69 mg/dL (ref 0.44–1.00)
GFR calc Af Amer: >60 mL/min (ref >60)
GFR calc non Af Amer: >60 mL/min (ref >60)
Glucose, Bld: 108 mg/dL — ABNORMAL HIGH (ref 70–99)
Potassium: 3.5 mmol/L (ref 3.5–5.1)
Sodium: 140 mmol/L (ref 135–145)

## 2020-05-22 LAB — CBC WITH DIFFERENTIAL/PLATELET
Abs Immature Granulocytes: 0.25 10*3/uL — ABNORMAL HIGH (ref 0.00–0.07)
Basophils Absolute: 0 10*3/uL (ref 0.0–0.1)
Basophils Relative: 0 %
Eosinophils Absolute: 0 10*3/uL (ref 0.0–0.5)
Eosinophils Relative: 0 %
HCT: 30.6 % — ABNORMAL LOW (ref 36.0–46.0)
Hemoglobin: 9.1 g/dL — ABNORMAL LOW (ref 12.0–15.0)
Immature Granulocytes: 1 %
Lymphocytes Relative: 22 %
Lymphs Abs: 4.5 10*3/uL — ABNORMAL HIGH (ref 0.7–4.0)
MCH: 24.2 pg — ABNORMAL LOW (ref 26.0–34.0)
MCHC: 29.7 g/dL — ABNORMAL LOW (ref 30.0–36.0)
MCV: 81.4 fL (ref 80.0–100.0)
Monocytes Absolute: 1.8 10*3/uL — ABNORMAL HIGH (ref 0.1–1.0)
Monocytes Relative: 9 %
Neutro Abs: 13.4 10*3/uL — ABNORMAL HIGH (ref 1.7–7.7)
Neutrophils Relative %: 68 %
Platelets: 341 10*3/uL (ref 150–400)
RBC: 3.76 MIL/uL — ABNORMAL LOW (ref 3.87–5.11)
RDW: 18.7 % — ABNORMAL HIGH (ref 11.5–15.5)
Smear Review: NORMAL
WBC: 20 10*3/uL — ABNORMAL HIGH (ref 4.0–10.5)
nRBC: 0 % (ref 0.0–0.2)

## 2020-05-22 LAB — CARDIOLIPIN ANTIBODIES, IGG, IGM, IGA
Anticardiolipin IgA: <9 APL U/mL (ref 0–11)
Anticardiolipin IgG: <9 GPL U/mL (ref 0–14)
Anticardiolipin IgM: 10 MPL U/mL (ref 0–12)

## 2020-05-22 NOTE — Progress Notes (Signed)
Progress Note    Regina Carpenter  EAV:[id number] DOB: 1976-01-08  DOA: 05/16/2020 PCP: Emmaline Kluver., MD      Brief Narrative:    Medical records reviewed and are as summarized below:  Regina Carpenter is a 44 y.o. female with medical history significant ofrheumatoid arthritis, polyarteritis vasculitis, chronic bilateral foot wounds who presents to the emergency department for concern of worsening wound of the bilateral feet. The patient states that she has known severe vasculopathy and over the past several months her lesions started as painful ulcers on multiple toes that is now progressed into a gangrenous state. The wounds have become increasingly painful and had some foul-smelling drainage from the area as well.       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  PLAN  Continue clindamycin Monitor CBC. Analgesics as needed for pain Continue cyclophosphamide and IV steroids Plan discussed with Dr. Cleda Mccreedy, podiatrist.  He will reexamine the foot on Saturday, 05/24/2020    Body mass index is 32.98 kg/m.  Diet Order            Diet regular Room service appropriate? Yes; Fluid consistency: Thin  Diet effective now                        Medications:   . amLODipine  5 mg Oral Daily  . clindamycin  300 mg Oral Q8H  . cyclophosphamide  50 mg Oral Daily  . enoxaparin (LOVENOX) injection  40 mg Subcutaneous Q24H  . folic acid  1 mg Oral Daily  . gabapentin  300 mg Oral TID  . methylPREDNISolone (SOLU-MEDROL) injection  125 mg  Intravenous Daily  . mupirocin ointment  1 application Nasal BID  . nicotine  7 mg Transdermal Daily   Continuous Infusions: . lactated ringers     And  . lactated ringers       Anti-infectives (From admission, onward)   Start     Dose/Rate Route Frequency Ordered Stop   05/21/20 2200  clindamycin (CLEOCIN) capsule 300 mg        300 mg Oral Every 8 hours 05/21/20 1459     05/17/20 1200  vancomycin (VANCOCIN) IVPB 1000 mg/200 mL premix  Status:  Discontinued        1,000 mg 200 mL/hr over 60 Minutes Intravenous Every 12 hours 05/17/20 1100 05/21/20 1458   05/16/20 2200  cefTRIAXone (ROCEPHIN) 2 g in sodium chloride 0.9 % 100 mL IVPB  Status:  Discontinued        2 g 200 mL/hr over 30 Minutes Intravenous Every 24 hours 05/16/20 1718 05/21/20 1458   05/16/20 2200  metroNIDAZOLE (FLAGYL) IVPB 500 mg  Status:  Discontinued        500 mg 100 mL/hr over 60 Minutes Intravenous Every 8 hours 05/16/20 1721 05/21/20 1458   05/16/20 1730  vancomycin (VANCOCIN) IVPB 1000 mg/200 mL premix  Status:  Discontinued        1,000 mg 200 mL/hr over 60 Minutes Intravenous  Once 05/16/20 1718 05/16/20 1802   05/16/20 1630  vancomycin (VANCOREADY) IVPB  1500 mg/300 mL        1,500 mg 150 mL/hr over 120 Minutes Intravenous  Once 05/16/20 1609 05/16/20 2031   05/16/20 1630  piperacillin-tazobactam (ZOSYN) IVPB 3.375 g        3.375 g 12.5 mL/hr over 240 Minutes Intravenous  Once 05/16/20 1609 05/16/20 2016             Family Communication/Anticipated D/C date and plan/Code Status   DVT prophylaxis: enoxaparin (LOVENOX) injection 40 mg Start: 05/16/20 2200     Code Status: Full Code  Family Communication: Plan discussed with patient Disposition Plan:    Status is: Inpatient  Remains inpatient appropriate because:Inpatient level of care appropriate due to severity of illness   Dispo: The patient is from: Home              Anticipated d/c is to: Home              Anticipated d/c date  is: 2 days              Patient currently is not medically stable to d/c.           Subjective:   C/o severe pain in the right foot.  She said she spilled her pills on the floor so pain medicine was delayed.  Objective:    Vitals:   05/21/20 1930 05/22/20 0019 05/22/20 0409 05/22/20 0719  BP: 119/81 117/73 131/86 (!) 122/93  Pulse: (!) 101 (!) 108 (!) 103 82  Resp: 17 16 17 18   Temp: 98.7 F (37.1 C) 98.6 F (37 C) 98.5 F (36.9 C) 98.7 F (37.1 C)  TempSrc: Oral Oral Oral Oral  SpO2: 100% 100% 96% 98%  Weight:      Height:       No data found.   Intake/Output Summary (Last 24 hours) at 05/22/2020 1211 Last data filed at 05/22/2020 0900 Gross per 24 hour  Intake 360 ml  Output 1100 ml  Net -740 ml   Filed Weights   05/16/20 1139 05/16/20 2030 05/20/20 1140  Weight: 83 kg 83.9 kg 89.9 kg    Exam:  GEN: NAD SKIN: Warm and dry EYES: EOMI ENT: MMM CV: RRR PULM: No wheezing or rales heard ABD: soft, obese, NT, +BS CNS: AAO x 3, non focal EXT: Multiple ulcers on right distal leg.  Gangrenous left fifth toe, present on admission.  Staples on right TMA stump are intact.              Data Reviewed:   I have personally reviewed following labs and imaging studies:  Labs: Labs show the following:   Basic Metabolic Panel: Recent Labs  Lab 05/17/20 0556 05/17/20 0556 05/18/20 0523 05/19/20 0340 05/19/20 0340 05/20/20 0433 05/20/20 0433 05/21/20 0443 05/22/20 0402  NA 137  --   --  138  --  136  --  137 140  K 3.7   < >  --  3.8   < > 3.6   < > 4.0 3.5  CL 103  --   --  103  --  106  --  107 109  CO2 25  --   --  24  --  23  --  24 24  GLUCOSE 81  --   --  79  --  86  --  115* 108*  BUN 9  --   --  7  --  10  --  10 12  CREATININE 0.78   < >  0.64 0.75  --  0.66  --  0.53 0.69  CALCIUM 9.2  --   --  8.6*  --  8.5*  --  8.5* 8.6*   < > = values in this interval not displayed.   GFR Estimated Creatinine Clearance: 99.5 mL/min (by C-G  formula based on SCr of 0.69 mg/dL). Liver Function Tests: Recent Labs  Lab 05/16/20 1141 05/17/20 0556  AST 17 13*  ALT 18 15  ALKPHOS 64 57  BILITOT 0.6 0.7  PROT 7.8 7.5  ALBUMIN 3.6 3.5   No results for input(s): LIPASE, AMYLASE in the last 168 hours. No results for input(s): AMMONIA in the last 168 hours. Coagulation profile Recent Labs  Lab 05/17/20 0556  INR 1.0    CBC: Recent Labs  Lab 05/16/20 1141 05/16/20 1141 05/17/20 0556 05/19/20 0340 05/20/20 0433 05/21/20 0443 05/22/20 0402  WBC 19.8*   < > 16.8* 11.4* 11.5* 17.2* 20.0*  NEUTROABS 17.2*  --   --  5.8 6.6 14.2* 13.4*  HGB 11.7*   < > 10.4* 9.8* 9.3* 9.1* 9.1*  HCT 37.3   < > 36.0 32.2* 31.0* 29.5* 30.6*  MCV 79.0*   < > 81.8 78.7* 81.2 77.8* 81.4  PLT 403*   < > 347 305 297 330 341   < > = values in this interval not displayed.   Cardiac Enzymes: No results for input(s): CKTOTAL, CKMB, CKMBINDEX, TROPONINI in the last 168 hours. BNP (last 3 results) No results for input(s): PROBNP in the last 8760 hours. CBG: No results for input(s): GLUCAP in the last 168 hours. D-Dimer: No results for input(s): DDIMER in the last 72 hours. Hgb A1c: No results for input(s): HGBA1C in the last 72 hours. Lipid Profile: No results for input(s): CHOL, HDL, LDLCALC, TRIG, CHOLHDL, LDLDIRECT in the last 72 hours. Thyroid function studies: No results for input(s): TSH, T4TOTAL, T3FREE, THYROIDAB in the last 72 hours.  Invalid input(s): FREET3 Anemia work up: No results for input(s): VITAMINB12, FOLATE, FERRITIN, TIBC, IRON, RETICCTPCT in the last 72 hours. Sepsis Labs: Recent Labs  Lab 05/16/20 1141 05/16/20 1141 05/16/20 1544 05/17/20 0556 05/17/20 0556 05/19/20 0340 05/20/20 0433 05/21/20 0443 05/22/20 0402  PROCALCITON  --   --   --  <0.10  --   --   --   --   --   WBC 19.8*   < >  --  16.8*   < > 11.4* 11.5* 17.2* 20.0*  LATICACIDVEN 2.2*  --  1.2  --   --   --   --   --   --    < > = values in  this interval not displayed.    Microbiology Recent Results (from the past 240 hour(s))  Blood culture (routine x 2)     Status: None   Collection Time: 05/16/20  3:49 PM   Specimen: BLOOD  Result Value Ref Range Status   Specimen Description BLOOD LEFT ANTECUBITAL  Final   Special Requests   Final    BOTTLES DRAWN AEROBIC AND ANAEROBIC Blood Culture results may not be optimal due to an inadequate volume of blood received in culture bottles   Culture   Final    NO GROWTH 5 DAYS Performed at [facility], [address] [place], [place] [address]    Report Status 05/21/2020 FINAL  Final  Blood culture (routine x 2)     Status: None   Collection Time: 05/16/20  3:54 PM  Specimen: BLOOD  Result Value Ref Range Status   Specimen Description BLOOD BLOOD RIGHT WRIST  Final   Special Requests   Final    BOTTLES DRAWN AEROBIC AND ANAEROBIC Blood Culture adequate volume   Culture   Final    NO GROWTH 5 DAYS Performed at [facility], [address] [place], [place] [address]    Report Status 05/21/2020 FINAL  Final  SARS Coronavirus 2 by RT PCR (hospital order, performed in [facility] hospital lab) Nasopharyngeal Nasopharyngeal Swab     Status: None   Collection Time: 05/16/20  5:46 PM   Specimen: Nasopharyngeal Swab  Result Value Ref Range Status   SARS Coronavirus 2 NEGATIVE NEGATIVE Final    Comment: (NOTE) SARS-CoV-2 target nucleic acids are NOT DETECTED.  The SARS-CoV-2 RNA is generally detectable in upper and lower respiratory specimens during the acute phase of infection. The lowest concentration of SARS-CoV-2 viral copies this assay can detect is 250 copies / mL. A negative result does not preclude SARS-CoV-2 infection and should not be used as the sole basis for treatment or other patient management decisions.  A negative result may occur with improper specimen collection / handling, submission of specimen other than nasopharyngeal swab, presence of  viral mutation(s) within the areas targeted by this assay, and inadequate number of viral copies (<250 copies / mL). A negative result must be combined with clinical observations, patient history, and epidemiological information.  Fact Sheet for Patients:   StrictlyIdeas.no  Fact Sheet for Healthcare Providers: BankingDealers.co.za  This test is not yet approved or  cleared by the [place] FDA and has been authorized for detection and/or diagnosis of SARS-CoV-2 by FDA under an Emergency Use Authorization (EUA).  This EUA will remain in effect (meaning this test can be used) for the duration of the COVID-19 declaration under Section 564(b)(1) of the Act, 21 U.S.C. section 360bbb-3(b)(1), unless the authorization is terminated or revoked sooner.  Performed at [facility], [address] [place], [place] [address]   Culture, blood (single)     Status: None   Collection Time: 05/16/20  8:52 PM   Specimen: BLOOD  Result Value Ref Range Status   Specimen Description BLOOD RIGHT ANTECUBITAL  Final   Special Requests   Final    BOTTLES DRAWN AEROBIC AND ANAEROBIC Blood Culture adequate volume   Culture   Final    NO GROWTH 5 DAYS Performed at [facility], [address] [place], [place] [address]    Report Status 05/21/2020 FINAL  Final  Surgical PCR screen     Status: Abnormal   Collection Time: 05/19/20  2:45 AM   Specimen: Nasal Mucosa; Nasal Swab  Result Value Ref Range Status   MRSA, PCR POSITIVE (A) NEGATIVE Final    Comment: RESULT CALLED TO, READ BACK BY AND VERIFIED WITH: TINA POTEAT 05/19/20 AT 0436 HS    Staphylococcus aureus POSITIVE (A) NEGATIVE Final    Comment: (NOTE) The Xpert SA Assay (FDA approved for NASAL specimens in patients 62 years of age and older), is one component of a comprehensive surveillance program. It is not intended to diagnose infection nor to guide or monitor  treatment. Performed at [facility], [address] [place], [place] [address]     Procedures and diagnostic studies:  No results found.             LOS: 6 days   Regina Carpenter  [facility]   Pager on [email or web address].  If 7PM-7AM, please contact night-coverage at [email or web address]     05/22/2020, 12:11 PM

## 2020-05-22 NOTE — Consult Note (Signed)
Rheumatology follow-up Follow-up vasculitis Chest pain since procedure.  No new IV steroids.  Creatinine stable.  Does have some microscopic hematuria but no casts Right lateral leg wound lesion has been dressed Lesions left lateral leg have not been progressive.  Have eschar. Pain in left fifth toe,  Exam: Clear chest.  Largest left lateral leg lesion is about nickel sized.  Eschar.  Hands without synovitis.  Knees without effusions.  No significant edema.  Polyarteritis/vasculitis.  Recent right hands metatarsal amputation.  No new ischemic lesions..  Leukocytosis may be from the IV steroids   Recommendation.  She will have to reschedule her IV Cytoxan as outpatient at [facility] When she is discharged please discharge on 40 mg of prednisone and her oral Cytoxan 50 mg dose May follow-up with me in about 2 weeks after discharge

## 2020-05-22 NOTE — Progress Notes (Signed)
Physical Therapy Evaluation Patient Details Name: Regina Carpenter MRN: [id number] DOB: [birth year] Today's Date: 05/22/2020   History of Present Illness  Per MD note:Regina Carpenter  has presented today for surgery, with the diagnosis of [place].  The various methods of treatment have been discussed with the patient and family. After consideration of risks, benefits and other options for treatment, the patient has consented to  Procedure(s):  Clinical Impression  Patient agrees to PT eval.She reports 8/10 pain to left foot. She has 3/5 BLE hip flex and knee extension strength. She has good sitting balance and fair standing balance with UE support with RW.   She needs min assist with bed mobility, min assist for transfers sit to stand with RW. She ambulates with RW 25 feet with min assist with post-op shoe. She has fatigue following gait. Patient will continue to benefit from skilled PT to improve mobility and strength.    Follow Up Recommendations Home health PT    Equipment Recommendations  Rolling walker with 5" wheels;Other (comment)    Recommendations for Other Services       Precautions / Restrictions Restrictions Weight Bearing Restrictions:  (waiting for Dr. Cleda Mccreedy to put in activity restrictions/WB ord)      Mobility  Bed Mobility Overal bed mobility: Needs Assistance Bed Mobility: Supine to Sit;Sit to Supine     Supine to sit: Min assist Sit to supine: Min assist   General bed mobility comments: VC for swquencing  Transfers Overall transfer level: Needs assistance Equipment used: Rolling walker (2 wheeled) Transfers: Sit to/from Stand Sit to Stand: Min assist         General transfer comment: cues for hand placement  Ambulation/Gait Ambulation/Gait assistance: Min assist Gait Distance (Feet): 25 Feet Assistive device: Rolling walker (2 wheeled) Gait Pattern/deviations: Step-to pattern        Stairs            Wheelchair Mobility    Modified  Rank[place] (Stroke Patients Only)       Balance Overall balance assessment: Modified Independent                                           Pertinent Vitals/Pain Pain Assessment: 0-10 Pain Score: 8  Pain Location: left foot Pain Descriptors / Indicators: Aching Pain Intervention(s): Limited activity within patient's tolerance    Home Living Family/patient expects to be discharged to:: Private residence Living Arrangements: Children Available Help at Discharge: Family   Home Access: Level entry     Home Layout: Two level        Prior Function Level of Independence: Independent               Hand Dominance        Extremity/Trunk Assessment        Lower Extremity Assessment Lower Extremity Assessment: Overall WFL for tasks assessed       Communication   Communication: No difficulties  Cognition Arousal/Alertness: Awake/alert Behavior During Therapy: WFL for tasks assessed/performed Overall Cognitive Status: Within Functional Limits for tasks assessed                                        General Comments      Exercises     Assessment/Plan    PT Assessment Patient  needs continued PT services  PT Problem List Decreased strength;Decreased balance;Decreased mobility       PT Treatment Interventions Gait training;Stair training;Therapeutic activities;Therapeutic exercise;Balance training    PT Goals (Current goals can be found in the Care Plan section)  Acute Rehab PT Goals Patient Stated Goal:  (to walk) PT Goal Formulation: With patient Time For Goal Achievement: 06/05/20 Potential to Achieve Goals: Good    Frequency 7X/week   Barriers to discharge Inaccessible home environment      Co-evaluation               AM-PAC PT "6 Clicks" Mobility  Outcome Measure Help needed turning from your back to your side while in a flat bed without using bedrails?: A Little Help needed moving from lying on your  back to sitting on the side of a flat bed without using bedrails?: A Little Help needed moving to and from a bed to a chair (including a wheelchair)?: A Little Help needed standing up from a chair using your arms (e.g., wheelchair or bedside chair)?: A Little Help needed to walk in hospital room?: A Little Help needed climbing 3-5 steps with a railing? : A Lot 6 Click Score: 17    End of Session Equipment Utilized During Treatment: Gait belt Activity Tolerance: Patient tolerated treatment well Patient left: in bed;with call bell/phone within reach Nurse Communication: Mobility status PT Visit Diagnosis: Unsteadiness on feet (R26.81);Muscle weakness (generalized) (M62.81);Difficulty in walking, not elsewhere classified (R26.2)    Time: 6415-8309 PT Time Calculation (min) (ACUTE ONLY): 25 min   Charges:   PT Evaluation $PT Eval Low Complexity: 1 Low PT Treatments $Gait Training: 8-22 mins         Alanson Puls, PT DPT 05/22/2020, 4:04 PM

## 2020-05-22 NOTE — Progress Notes (Signed)
2 Days Post-Op   Subjective/Chief Complaint: Patient seen.  Relates significant increase in her pain overnight.  Managing now with medication.   Objective: Vital signs in last 24 hours: Temp:  [98.5 F (36.9 C)-99.1 F (37.3 C)] 98.7 F (37.1 C) (08/19 0719) Pulse Rate:  [82-108] 82 (08/19 0719) Resp:  [16-18] 18 (08/19 0719) BP: (117-131)/(73-93) 122/93 (08/19 0719) SpO2:  [96 %-100 %] 98 % (08/19 0719) Last BM Date: 05/19/20  Intake/Output from previous day: 08/18 0701 - 08/19 0700 In: 240 [P.O.:240] Out: 1100 [Urine:1100] Intake/Output this shift: Total I/O In: 360 [P.O.:360] Out: -   Bandage on the right foot is dry and intact.  Upon removal only a mild amount of bleeding on the bandage.  A small area of drainage from the central aspect of the incision.  Multiple full-thickness ulcerative areas noted on the lateral aspect of the right lower leg and ankle region with an eschar plantarly on the right heel.  The incision is well coapted with no clear evidence of necrosis of the flaps.         Lab Results:  Recent Labs    05/21/20 0443 05/22/20 0402  WBC 17.2* 20.0*  HGB 9.1* 9.1*  HCT 29.5* 30.6*  PLT 330 341   BMET Recent Labs    05/21/20 0443 05/22/20 0402  NA 137 140  K 4.0 3.5  CL 107 109  CO2 24 24  GLUCOSE 115* 108*  BUN 10 12  CREATININE 0.53 0.69  CALCIUM 8.5* 8.6*   PT/INR No results for input(s): LABPROT, INR in the last 72 hours. ABG No results for input(s): PHART, HCO3 in the last 72 hours.  Invalid input(s): PCO2, PO2  Studies/Results: No results found.  Anti-infectives: Anti-infectives (From admission, onward)   Start     Dose/Rate Route Frequency Ordered Stop   05/21/20 2200  clindamycin (CLEOCIN) capsule 300 mg        300 mg Oral Every 8 hours 05/21/20 1459     05/17/20 1200  vancomycin (VANCOCIN) IVPB 1000 mg/200 mL premix  Status:  Discontinued        1,000 mg 200 mL/hr over 60 Minutes Intravenous Every 12 hours 05/17/20  1100 05/21/20 1458   05/16/20 2200  cefTRIAXone (ROCEPHIN) 2 g in sodium chloride 0.9 % 100 mL IVPB  Status:  Discontinued        2 g 200 mL/hr over 30 Minutes Intravenous Every 24 hours 05/16/20 1718 05/21/20 1458   05/16/20 2200  metroNIDAZOLE (FLAGYL) IVPB 500 mg  Status:  Discontinued        500 mg 100 mL/hr over 60 Minutes Intravenous Every 8 hours 05/16/20 1721 05/21/20 1458   05/16/20 1730  vancomycin (VANCOCIN) IVPB 1000 mg/200 mL premix  Status:  Discontinued        1,000 mg 200 mL/hr over 60 Minutes Intravenous  Once 05/16/20 1718 05/16/20 1802   05/16/20 1630  vancomycin (VANCOREADY) IVPB 1500 mg/300 mL        1,500 mg 150 mL/hr over 120 Minutes Intravenous  Once 05/16/20 1609 05/16/20 2031   05/16/20 1630  piperacillin-tazobactam (ZOSYN) IVPB 3.375 g        3.375 g 12.5 mL/hr over 240 Minutes Intravenous  Once 05/16/20 1609 05/16/20 2016      Assessment/Plan: s/p Procedure(s): TRANSMETATARSAL AMPUTATION (Right) Assessment: Still guarded prognosis status post transmetatarsal amputation.   Plan: Betadine gauze applied to the transmetatarsal incision site as well as the ulcerative areas on the right leg and  heel followed by a bulky bandage.  At this point the amputation site appears stable but we will have to keep a close check on this.  Plan for dressing change in another couple of days.  LOS: 6 days    [name] 05/22/2020

## 2020-05-23 LAB — CBC WITH DIFFERENTIAL/PLATELET
Abs Immature Granulocytes: 0.4 10*3/uL — ABNORMAL HIGH (ref 0.00–0.07)
Basophils Absolute: 0 10*3/uL (ref 0.0–0.1)
Basophils Relative: 0 %
Eosinophils Absolute: 0 10*3/uL (ref 0.0–0.5)
Eosinophils Relative: 0 %
HCT: 29.3 % — ABNORMAL LOW (ref 36.0–46.0)
Hemoglobin: 8.7 g/dL — ABNORMAL LOW (ref 12.0–15.0)
Immature Granulocytes: 2 %
Lymphocytes Relative: 27 %
Lymphs Abs: 5.5 10*3/uL — ABNORMAL HIGH (ref 0.7–4.0)
MCH: 24.1 pg — ABNORMAL LOW (ref 26.0–34.0)
MCHC: 29.7 g/dL — ABNORMAL LOW (ref 30.0–36.0)
MCV: 81.2 fL (ref 80.0–100.0)
Monocytes Absolute: 1.8 10*3/uL — ABNORMAL HIGH (ref 0.1–1.0)
Monocytes Relative: 9 %
Neutro Abs: 12.3 10*3/uL — ABNORMAL HIGH (ref 1.7–7.7)
Neutrophils Relative %: 62 %
Platelets: 333 10*3/uL (ref 150–400)
RBC: 3.61 MIL/uL — ABNORMAL LOW (ref 3.87–5.11)
RDW: 18.8 % — ABNORMAL HIGH (ref 11.5–15.5)
Smear Review: NORMAL
WBC: 20.1 10*3/uL — ABNORMAL HIGH (ref 4.0–10.5)
nRBC: 0 % (ref 0.0–0.2)

## 2020-05-23 LAB — BASIC METABOLIC PANEL
Anion gap: 5 (ref 5–15)
BUN: 13 mg/dL (ref 6–20)
CO2: 26 mmol/L (ref 22–32)
Calcium: 8.6 mg/dL — ABNORMAL LOW (ref 8.9–10.3)
Chloride: 106 mmol/L (ref 98–111)
Creatinine, Ser: 0.59 mg/dL (ref 0.44–1.00)
GFR calc Af Amer: >60 mL/min (ref >60)
GFR calc non Af Amer: >60 mL/min (ref >60)
Glucose, Bld: 74 mg/dL (ref 70–99)
Potassium: 3.9 mmol/L (ref 3.5–5.1)
Sodium: 137 mmol/L (ref 135–145)

## 2020-05-23 LAB — SURGICAL PATHOLOGY

## 2020-05-23 MED ORDER — SENNOSIDES-DOCUSATE SODIUM 8.6-50 MG PO TABS
1.0000 | ORAL_TABLET | Freq: Every day | ORAL | Status: DC
  Administered 2020-05-24: 1 via ORAL
  Filled 2020-05-23: qty 1

## 2020-05-23 NOTE — Evaluation (Addendum)
Occupational Therapy Evaluation Patient Details Name: Regina Carpenter MRN: [id number] DOB: 15-May-1976 Today's Date: 05/23/2020    History of Present Illness     Clinical Impression   Pt was seen for OT evaluation this date. Prior to hospital admission, pt was Indep with all aspects of self care ADLs/IADLs including driving. In recent weeks, pt has been declining and requiring assist from her adult children. Pt lives with a son and daughter in two level home with level entry and 12 steps to second floor where her BR/BA are located. Currently pt demonstrates impairments as described below (See OT problem list) which functionally limit her ability to perform ADL/self-care tasks. Pt currently requires MIN A for LB ADLs and MIN A with ADL transfers with RW. Pt requires >10% verbal cues to maintain WB precautions and demos good understanding with education provided.  Pt would benefit from skilled OT to address noted impairments and functional limitations (see below for any additional details) in order to maximize safety and independence while minimizing falls risk and caregiver burden. Upon hospital discharge, recommend HHOT to maximize pt safety and return to functional independence during meaningful occupations of daily life.     Follow Up Recommendations  Home health OT;Supervision - Intermittent    Equipment Recommendations  3 in 1 bedside commode;Tub/shower seat;Other (comment) (2WW)    Recommendations for Other Services       Precautions / Restrictions Precautions Precautions: Fall Restrictions Weight Bearing Restrictions: Yes Other Position/Activity Restrictions: Bathroom privileges and transfers using OrthoWedge surgical shoe on the right foot with pressure only on the heel.      Mobility Bed Mobility Overal bed mobility: Needs Assistance Bed Mobility: Supine to Sit     Supine to sit: Min assist     General bed mobility comments: verbal cues for  sequencing.  Transfers Overall transfer level: Needs assistance Equipment used: Rolling walker (2 wheeled) Transfers: Sit to/from Stand Sit to Stand: Min assist         General transfer comment: MIN verbal cues for safe hand/foot placement relative to RW use    Balance Overall balance assessment: Needs assistance   Sitting balance-Leahy Scale: Normal       Standing balance-Leahy Scale: Fair Standing balance comment: requires UE support to sustain balance d/t WB orders and post-op shoe. In addition, requires intermittent CGA for stabilization such as with turning/pivoting.                           ADL either performed or assessed with clinical judgement   ADL Overall ADL's : Needs assistance/impaired                                       General ADL Comments: able to perform seated UB self care with setup to Indep. Requires MIN A with LB ADLs.     Vision Patient Visual Report: No change from baseline       Perception     Praxis      Pertinent Vitals/Pain Pain Assessment: Faces Faces Pain Scale: Hurts little more Pain Location: R foot Pain Descriptors / Indicators: Tender;Sore Pain Intervention(s): Limited activity within patient's tolerance;Monitored during session     Hand Dominance     Extremity/Trunk Assessment Upper Extremity Assessment Upper Extremity Assessment: Overall WFL for tasks assessed   Lower Extremity Assessment Lower Extremity Assessment: Defer to PT evaluation;RLE deficits/detail  RLE Deficits / Details: Pt with ace bandage limiting ankle ROM. Pt able to appropriately flex/ext hip and knee and abd/add hip RLE: Unable to fully assess due to pain       Communication Communication Communication: No difficulties   Cognition Arousal/Alertness: Awake/alert Behavior During Therapy: WFL for tasks assessed/performed Overall Cognitive Status: Within Functional Limits for tasks assessed                                      General Comments       Exercises Other Exercises Other Exercises: OT facilitates ed re: role of OT in acute setting, home setup considerations to prepare for d/c, safety considerations for while in acute care including ed re: call light use. Pt with good understanding.   Shoulder Instructions      Home Living Family/patient expects to be discharged to:: Private residence Living Arrangements: Children Available Help at Discharge: Family Type of Home: House Home Access: Level entry     Home Layout: Two level Alternate Level Stairs-Number of Steps: 12 Alternate Level Stairs-Rails: Right           Home Equipment: None          Prior Functioning/Environment Level of Independence: Independent        Comments: Pt was driving and performing all HH IADLs in addition to BADLs, needing increased assist in recent weeks d/t pain.        OT Problem List: Decreased strength;Decreased range of motion;Decreased activity tolerance;Impaired balance (sitting and/or standing);Decreased knowledge of use of DME or AE      OT Treatment/Interventions: Self-care/ADL training;Therapeutic exercise;DME and/or AE instruction;Therapeutic activities;Balance training;Patient/family education    OT Goals(Current goals can be found in the care plan section) Acute Rehab OT Goals Patient Stated Goal: to be able to go home and be confident about getting to my BR/BA on second floor OT Goal Formulation: With patient Time For Goal Achievement: 06/06/20 Potential to Achieve Goals: Good ADL Goals Pt Will Perform Lower Body Dressing: with supervision;sit to/from stand (with AE PRN) Pt Will Transfer to Toilet: with supervision;ambulating (with RW to restroom with BSC over standard commode to elevate as needed)  OT Frequency: Min 1X/week   Barriers to D/C:            Co-evaluation              AM-PAC OT "6 Clicks" Daily Activity     Outcome Measure Help from another  person eating meals?: None Help from another person taking care of personal grooming?: A Little Help from another person toileting, which includes using toliet, bedpan, or urinal?: A Little Help from another person bathing (including washing, rinsing, drying)?: A Little Help from another person to put on and taking off regular upper body clothing?: None Help from another person to put on and taking off regular lower body clothing?: A Little 6 Click Score: 20   End of Session Equipment Utilized During Treatment: Gait belt;Rolling walker  Activity Tolerance: Patient tolerated treatment well Patient left: in chair;with call bell/phone within reach  OT Visit Diagnosis: Unsteadiness on feet (R26.81);Muscle weakness (generalized) (M62.81)                Time: 5176-1607 OT Time Calculation (min): 31 min Charges:  OT General Charges $OT Visit: 1 Visit OT Evaluation $OT Eval Moderate Complexity: 1 Mod OT Treatments $Self Care/Home Management : 8-22 mins  Gerrianne Scale, Barren, OTR/L ascom [phone] 05/23/20, 1:10 PM

## 2020-05-23 NOTE — Progress Notes (Signed)
Physical Therapy Treatment Patient Details Name: Regina Carpenter MRN: [id number] DOB: 17-Dec-1975 Today's Date: 05/23/2020    History of Present Illness Per MD note: Regina Carpenter is a 44 y.o. female with medical history significant of rheumatoid arthritis, polyarteritis vasculitis, chronic bilateral foot wounds who presents to the emergency department for concern of worsening wound of the bilateral feet.  The patient states that she has known severe vasculopathy and over the past several months her lesions started as painful ulcers on multiple toes that is now progressed into a gangrenous state. Pt adm d/t Sepsis secondary to right foot wound infection with gangrenous right forefoot and is now S/p right transmetatarsal amputation of right forefoot    PT Comments    Pt was long sitting in bed upon arriving.  She agrees to PT session and is cooperative throughout. Due to surgeons request to limit ambulation and wt bearing activity, pt stood pivot to W/C and was transported to stairs. Performed stairs as if NWB, scooting up stairs on bottom to maintain no wt on LEs. Was able to perform safely. Pt states feeling more confident after performing stairs about upcoming DC. She will benefit from HHPT at DC to address deificts and improve safe functional mobility. Pt was in bed at conclusion of session.      Follow Up Recommendations  Home health PT     Equipment Recommendations  Rolling walker with 5" wheels    Recommendations for Other Services       Precautions / Restrictions Precautions Precautions: Fall Restrictions Weight Bearing Restrictions: Yes Other Position/Activity Restrictions: Bathroom privileges and transfers using OrthoWedge surgical shoe on the right foot with pressure only on the heel. heel wt bearing in darco shoes.     Mobility  Bed Mobility Overal bed mobility: Modified Independent Bed Mobility: Supine to Sit     Supine to sit: Modified independent (Device/Increase  time) Sit to supine: Supervision   General bed mobility comments: verbal cues for sequencing.  Transfers Overall transfer level: Modified independent Equipment used: Rolling walker (2 wheeled) Transfers: Sit to/from Stand Sit to Stand: Supervision         General transfer comment: pt performed STS with supervision and able to take steps to w/c  Ambulation/Gait             General Gait Details: deferred ambulation 2/2 to pt's wt bearing restrictions and surgeon not wanting pt to ambulate distances   Stairs Stairs: Yes Stairs assistance: Supervision;+2 safety/equipment Stair Management: No rails;Seated/boosting;One rail Left;Backwards Number of Stairs: 6 General stair comments: Therapist demonstarted correct performance of performing stairs without wt bearing. pt has 12 stair at home and voices concerns prior to session with being able to perform. She was able to scoot/bump up stairs on bottom without assistance safely however HR does increase to 140s. breathing techniques use and extended rest. HR quickly lowers to 110 and pt able to complete stair training.   Wheelchair Mobility    Modified Rankin (Stroke Patients Only)       Balance Overall balance assessment: Needs assistance Sitting-balance support: No upper extremity supported;Feet unsupported Sitting balance-Leahy Scale: Normal     Standing balance support: Bilateral upper extremity supported;During functional activity Standing balance-Leahy Scale: Good Standing balance comment: requires UE support to sustain balance d/t WB orders and post-op shoe. In addition, requires intermittent CGA for stabilization such as with turning/pivoting.  Cognition Arousal/Alertness: Awake/alert Behavior During Therapy: WFL for tasks assessed/performed Overall Cognitive Status: Within Functional Limits for tasks assessed                                 General Comments: Pt  is A and O x 4      Exercises Other Exercises Other Exercises: OT facilitates ed re: role of OT in acute setting, home setup considerations to prepare for d/c, safety considerations for while in acute care including ed re: call light use. Pt with good understanding.    General Comments        Pertinent Vitals/Pain Pain Assessment: No/denies pain Pain Score: 0-No pain Pain Location: R foot Pain Descriptors / Indicators: Tender;Sore Pain Intervention(s): Limited activity within patient's tolerance;Premedicated before session;Monitored during session;Repositioned;Ice applied    Home Living                      Prior Function            PT Goals (current goals can now be found in the care plan section) Acute Rehab PT Goals Patient Stated Goal: to be able to go home and be confident about getting to my BR/BA on second floor Progress towards PT goals: Progressing toward goals    Frequency    7X/week      PT Plan Current plan remains appropriate    Co-evaluation              AM-PAC PT "6 Clicks" Mobility   Outcome Measure  Help needed turning from your back to your side while in a flat bed without using bedrails?: A Little Help needed moving from lying on your back to sitting on the side of a flat bed without using bedrails?: A Little Help needed moving to and from a bed to a chair (including a wheelchair)?: A Little Help needed standing up from a chair using your arms (e.g., wheelchair or bedside chair)?: A Little Help needed to walk in hospital room?: A Little Help needed climbing 3-5 steps with a railing? : A Lot 6 Click Score: 17    End of Session Equipment Utilized During Treatment: Gait belt Activity Tolerance: Patient tolerated treatment well Patient left: in bed;with call bell/phone within reach Nurse Communication: Mobility status PT Visit Diagnosis: Unsteadiness on feet (R26.81);Muscle weakness (generalized) (M62.81);Difficulty in walking,  not elsewhere classified (R26.2)     Time: 5809-9833 PT Time Calculation (min) (ACUTE ONLY): 27 min  Charges:  $Gait Training: 8-22 mins $Therapeutic Activity: 8-22 mins                     Julaine Fusi PTA 05/23/20, 5:06 PM

## 2020-05-23 NOTE — Progress Notes (Signed)
Progress Note    Regina Carpenter  YOV:[id number] DOB: [birth year]  DOA: 05/16/2020 PCP: Emmaline Kluver., MD      Brief Narrative:    Medical records reviewed and are as summarized below:  Regina Carpenter is a 44 y.o. female with medical history significant ofrheumatoid arthritis, polyarteritis vasculitis, chronic bilateral foot wounds who presents to the emergency department for concern of worsening wound of the bilateral feet. The patient states that she has known severe vasculopathy and over the past several months her lesions started as painful ulcers on multiple toes that is now progressed into a gangrenous state. The wounds have become increasingly painful and had some foul-smelling drainage from the area as well.       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  PLAN  Continue clindamycin Monitor CBC. Analgesics as needed for pain Continue cyclophosphamide and IV steroids Surgical wound will be reexamined by podiatrist tomorrow Plan for possible discharge to home tomorrow with home health PT and RN.   Body mass index is 32.98 kg/m.  (Obesity): This complicates overall care and prognosis  Diet Order            Diet regular Room service appropriate? Yes; Fluid consistency: Thin  Diet effective now                        Medications:   . amLODipine  5 mg Oral Daily  . clindamycin  300 mg Oral Q8H  . cyclophosphamide  50 mg Oral Daily  . enoxaparin (LOVENOX) injection  40 mg Subcutaneous Q24H  . folic acid  1 mg Oral Daily  .  gabapentin  300 mg Oral TID  . methylPREDNISolone (SOLU-MEDROL) injection  125 mg Intravenous Daily  . mupirocin ointment  1 application Nasal BID  . nicotine  7 mg Transdermal Daily  . senna-docusate  1 tablet Oral Daily   Continuous Infusions: . lactated ringers     And  . lactated ringers       Anti-infectives (From admission, onward)   Start     Dose/Rate Route Frequency Ordered Stop   05/21/20 2200  clindamycin (CLEOCIN) capsule 300 mg        300 mg Oral Every 8 hours 05/21/20 1459     05/17/20 1200  vancomycin (VANCOCIN) IVPB 1000 mg/200 mL premix  Status:  Discontinued        1,000 mg 200 mL/hr over 60 Minutes Intravenous Every 12 hours 05/17/20 1100 05/21/20 1458   05/16/20 2200  cefTRIAXone (ROCEPHIN) 2 g in sodium chloride 0.9 % 100 mL IVPB  Status:  Discontinued        2 g 200 mL/hr over 30 Minutes Intravenous Every 24 hours 05/16/20 1718 05/21/20 1458   05/16/20 2200  metroNIDAZOLE (FLAGYL) IVPB 500 mg  Status:  Discontinued        500 mg 100 mL/hr over 60 Minutes Intravenous Every 8 hours 05/16/20 1721 05/21/20 1458   05/16/20 1730  vancomycin (VANCOCIN) IVPB 1000 mg/200 mL premix  Status:  Discontinued        1,000  mg 200 mL/hr over 60 Minutes Intravenous  Once 05/16/20 1718 05/16/20 1802   05/16/20 1630  vancomycin (VANCOREADY) IVPB 1500 mg/300 mL        1,500 mg 150 mL/hr over 120 Minutes Intravenous  Once 05/16/20 1609 05/16/20 2031   05/16/20 1630  piperacillin-tazobactam (ZOSYN) IVPB 3.375 g        3.375 g 12.5 mL/hr over 240 Minutes Intravenous  Once 05/16/20 1609 05/16/20 2016             Family Communication/Anticipated D/C date and plan/Code Status   DVT prophylaxis: enoxaparin (LOVENOX) injection 40 mg Start: 05/16/20 2200     Code Status: Full Code  Family Communication: Plan discussed with patient Disposition Plan:    Status is: Inpatient  Remains inpatient appropriate because:Inpatient level of care appropriate due to severity of  illness   Dispo: The patient is from: Home              Anticipated d/c is to: Home              Anticipated d/c date is: 1 day              Patient currently is not medically stable to d/c.           Subjective:   Pain in right foot is better today.  No other complaints.  Objective:    Vitals:   05/23/20 0428 05/23/20 0806 05/23/20 1150 05/23/20 1512  BP: (!) 128/93 (!) 135/94 122/81 127/89  Pulse: 90 75 90 (!) 106  Resp: 18 16 16 16   Temp: 98.5 F (36.9 C) 98.3 F (36.8 C) 97.8 F (36.6 C) 98.2 F (36.8 C)  TempSrc: Oral Oral Oral Oral  SpO2: 100% 100% 96% 99%  Weight:      Height:       No data found.   Intake/Output Summary (Last 24 hours) at 05/23/2020 1542 Last data filed at 05/23/2020 1031 Gross per 24 hour  Intake 480 ml  Output --  Net 480 ml   Filed Weights   05/16/20 1139 05/16/20 2030 05/20/20 1140  Weight: 83 kg 83.9 kg 89.9 kg    Exam:  GEN: NAD SKIN: Warm and dry EYES: No pallor or icterus ENT: MMM CV: RRR PULM: Clear to auscultation bilaterally ABD: soft, obese, NT, +BS CNS: AAO x 3, non focal EXT: Multiple ulcers on right distal leg present on admission.  Gangrenous left fifth toe, present on admission.  Dressing on right foot is clean and dry.            Data Reviewed:   I have personally reviewed following labs and imaging studies:  Labs: Labs show the following:   Basic Metabolic Panel: Recent Labs  Lab 05/19/20 0340 05/19/20 0340 05/20/20 0433 05/20/20 0433 05/21/20 0443 05/21/20 0443 05/22/20 0402 05/23/20 0552  NA 138  --  136  --  137  --  140 137  K 3.8   < > 3.6   < > 4.0   < > 3.5 3.9  CL 103  --  106  --  107  --  109 106  CO2 24  --  23  --  24  --  24 26  GLUCOSE 79  --  86  --  115*  --  108* 74  BUN 7  --  10  --  10  --  12 13  CREATININE 0.75  --  0.66  --  0.53  --  0.69 0.59  CALCIUM 8.6*  --  8.5*  --  8.5*  --  8.6* 8.6*   < > = values in this interval not displayed.    GFR Estimated Creatinine Clearance: 99.5 mL/min (by C-G formula based on SCr of 0.59 mg/dL). Liver Function Tests: Recent Labs  Lab 05/17/20 0556  AST 13*  ALT 15  ALKPHOS 57  BILITOT 0.7  PROT 7.5  ALBUMIN 3.5   No results for input(s): LIPASE, AMYLASE in the last 168 hours. No results for input(s): AMMONIA in the last 168 hours. Coagulation profile Recent Labs  Lab 05/17/20 0556  INR 1.0    CBC: Recent Labs  Lab 05/19/20 0340 05/20/20 0433 05/21/20 0443 05/22/20 0402 05/23/20 0552  WBC 11.4* 11.5* 17.2* 20.0* 20.1*  NEUTROABS 5.8 6.6 14.2* 13.4* 12.3*  HGB 9.8* 9.3* 9.1* 9.1* 8.7*  HCT 32.2* 31.0* 29.5* 30.6* 29.3*  MCV 78.7* 81.2 77.8* 81.4 81.2  PLT 305 297 330 341 333   Cardiac Enzymes: No results for input(s): CKTOTAL, CKMB, CKMBINDEX, TROPONINI in the last 168 hours. BNP (last 3 results) No results for input(s): PROBNP in the last 8760 hours. CBG: No results for input(s): GLUCAP in the last 168 hours. D-Dimer: No results for input(s): DDIMER in the last 72 hours. Hgb A1c: No results for input(s): HGBA1C in the last 72 hours. Lipid Profile: No results for input(s): CHOL, HDL, LDLCALC, TRIG, CHOLHDL, LDLDIRECT in the last 72 hours. Thyroid function studies: No results for input(s): TSH, T4TOTAL, T3FREE, THYROIDAB in the last 72 hours.  Invalid input(s): FREET3 Anemia work up: No results for input(s): VITAMINB12, FOLATE, FERRITIN, TIBC, IRON, RETICCTPCT in the last 72 hours. Sepsis Labs: Recent Labs  Lab 05/16/20 1544 05/17/20 0556 05/19/20 0340 05/20/20 0433 05/21/20 0443 05/22/20 0402 05/23/20 0552  PROCALCITON  --  <0.10  --   --   --   --   --   WBC  --  16.8*   < > 11.5* 17.2* 20.0* 20.1*  LATICACIDVEN 1.2  --   --   --   --   --   --    < > = values in this interval not displayed.    Microbiology Recent Results (from the past 240 hour(s))  Blood culture (routine x 2)     Status: None   Collection Time: 05/16/20  3:49 PM    Specimen: BLOOD  Result Value Ref Range Status   Specimen Description BLOOD LEFT ANTECUBITAL  Final   Special Requests   Final    BOTTLES DRAWN AEROBIC AND ANAEROBIC Blood Culture results may not be optimal due to an inadequate volume of blood received in culture bottles   Culture   Final    NO GROWTH 5 DAYS Performed at [facility], [address] [place], [place] [address]    Report Status 05/21/2020 FINAL  Final  Blood culture (routine x 2)     Status: None   Collection Time: 05/16/20  3:54 PM   Specimen: BLOOD  Result Value Ref Range Status   Specimen Description BLOOD BLOOD RIGHT WRIST  Final   Special Requests   Final    BOTTLES DRAWN AEROBIC AND ANAEROBIC Blood Culture adequate volume   Culture   Final    NO GROWTH 5 DAYS Performed at [facility], [address] [place], [place] [address]    Report Status 05/21/2020 FINAL  Final  SARS Coronavirus 2 by RT PCR (hospital order, performed in [place] hospital  lab) Nasopharyngeal Nasopharyngeal Swab     Status: None   Collection Time: 05/16/20  5:46 PM   Specimen: Nasopharyngeal Swab  Result Value Ref Range Status   SARS Coronavirus 2 NEGATIVE NEGATIVE Final    Comment: (NOTE) SARS-CoV-2 target nucleic acids are NOT DETECTED.  The SARS-CoV-2 RNA is generally detectable in upper and lower respiratory specimens during the acute phase of infection. The lowest concentration of SARS-CoV-2 viral copies this assay can detect is 250 copies / mL. A negative result does not preclude SARS-CoV-2 infection and should not be used as the sole basis for treatment or other patient management decisions.  A negative result may occur with improper specimen collection / handling, submission of specimen other than nasopharyngeal swab, presence of viral mutation(s) within the areas targeted by this assay, and inadequate number of viral copies (<250 copies / mL). A negative result must be combined with  clinical observations, patient history, and epidemiological information.  Fact Sheet for Patients:   StrictlyIdeas.no  Fact Sheet for Healthcare Providers: BankingDealers.co.za  This test is not yet approved or  cleared by the [place] FDA and has been authorized for detection and/or diagnosis of SARS-CoV-2 by FDA under an Emergency Use Authorization (EUA).  This EUA will remain in effect (meaning this test can be used) for the duration of the COVID-19 declaration under Section 564(b)(1) of the Act, 21 U.S.C. section 360bbb-3(b)(1), unless the authorization is terminated or revoked sooner.  Performed at [facility], [address] [place], [place] [address]   Culture, blood (single)     Status: None   Collection Time: 05/16/20  8:52 PM   Specimen: BLOOD  Result Value Ref Range Status   Specimen Description BLOOD RIGHT ANTECUBITAL  Final   Special Requests   Final    BOTTLES DRAWN AEROBIC AND ANAEROBIC Blood Culture adequate volume   Culture   Final    NO GROWTH 5 DAYS Performed at [facility], [address], [place], [place] [address]    Report Status 05/21/2020 FINAL  Final  Surgical PCR screen     Status: Abnormal   Collection Time: 05/19/20  2:45 AM   Specimen: Nasal Mucosa; Nasal Swab  Result Value Ref Range Status   MRSA, PCR POSITIVE (A) NEGATIVE Final    Comment: RESULT CALLED TO, READ BACK BY AND VERIFIED WITH: TINA POTEAT 05/19/20 AT 0436 HS    Staphylococcus aureus POSITIVE (A) NEGATIVE Final    Comment: (NOTE) The Xpert SA Assay (FDA approved for NASAL specimens in patients 38 years of age and older), is one component of a comprehensive surveillance program. It is not intended to diagnose infection nor to guide or monitor treatment. Performed at [facility], [address] [place], [place] [address]     Procedures and diagnostic studies:  No results  found.             LOS: 7 days   Nicholes Hibler  [facility]   Pager on [email or web address]. If 7PM-7AM, please contact night-coverage at [email or web address]     05/23/2020, 3:42 PM

## 2020-05-24 MED ORDER — CLINDAMYCIN HCL 300 MG PO CAPS: 300.0000 mg | ORAL_CAPSULE | Freq: Three times a day (TID) | ORAL | 0 refills | Status: AC

## 2020-05-24 NOTE — Plan of Care (Signed)

## 2020-05-24 NOTE — Progress Notes (Signed)
Medication from home stored in pharmacy has been returned to patient.

## 2020-05-24 NOTE — Progress Notes (Addendum)
4 Days Post-Op   Subjective/Chief Complaint: Patient seen.  States that the pain in her foot is improving.  Overall doing better.   Objective: Vital signs in last 24 hours: Temp:  [97.8 F (36.6 C)-98.3 F (36.8 C)] 98.3 F (36.8 C) (08/21 0805) Pulse Rate:  [72-106] 84 (08/21 0805) Resp:  [16-18] 16 (08/21 0805) BP: (116-132)/(79-92) 116/81 (08/21 0805) SpO2:  [96 %-100 %] 97 % (08/21 0805) Last BM Date: 05/24/20  Intake/Output from previous day: 08/20 0701 - 08/21 0700 In: 480 [P.O.:480] Out: -  Intake/Output this shift: No intake/output data recorded.  The bandage on the right foot is dry and intact.  Upon removal no significant drainage is noted.  Incision is well coapted with viable skin edges and no sign of expressible drainage or infection.  Ulcerations on the lateral aspect of the right leg appear improved with Betadine gauze dressing changes.        Lab Results:  Recent Labs    05/22/20 0402 05/23/20 0552  WBC 20.0* 20.1*  HGB 9.1* 8.7*  HCT 30.6* 29.3*  PLT 341 333   BMET Recent Labs    05/22/20 0402 05/23/20 0552  NA 140 137  K 3.5 3.9  CL 109 106  CO2 24 26  GLUCOSE 108* 74  BUN 12 13  CREATININE 0.69 0.59  CALCIUM 8.6* 8.6*   PT/INR No results for input(s): LABPROT, INR in the last 72 hours. ABG No results for input(s): PHART, HCO3 in the last 72 hours.  Invalid input(s): PCO2, PO2  Studies/Results: No results found.  Anti-infectives: Anti-infectives (From admission, onward)   Start     Dose/Rate Route Frequency Ordered Stop   05/21/20 2200  clindamycin (CLEOCIN) capsule 300 mg        300 mg Oral Every 8 hours 05/21/20 1459     05/17/20 1200  vancomycin (VANCOCIN) IVPB 1000 mg/200 mL premix  Status:  Discontinued        1,000 mg 200 mL/hr over 60 Minutes Intravenous Every 12 hours 05/17/20 1100 05/21/20 1458   05/16/20 2200  cefTRIAXone (ROCEPHIN) 2 g in sodium chloride 0.9 % 100 mL IVPB  Status:  Discontinued        2 g 200  mL/hr over 30 Minutes Intravenous Every 24 hours 05/16/20 1718 05/21/20 1458   05/16/20 2200  metroNIDAZOLE (FLAGYL) IVPB 500 mg  Status:  Discontinued        500 mg 100 mL/hr over 60 Minutes Intravenous Every 8 hours 05/16/20 1721 05/21/20 1458   05/16/20 1730  vancomycin (VANCOCIN) IVPB 1000 mg/200 mL premix  Status:  Discontinued        1,000 mg 200 mL/hr over 60 Minutes Intravenous  Once 05/16/20 1718 05/16/20 1802   05/16/20 1630  vancomycin (VANCOREADY) IVPB 1500 mg/300 mL        1,500 mg 150 mL/hr over 120 Minutes Intravenous  Once 05/16/20 1609 05/16/20 2031   05/16/20 1630  piperacillin-tazobactam (ZOSYN) IVPB 3.375 g        3.375 g 12.5 mL/hr over 240 Minutes Intravenous  Once 05/16/20 1609 05/16/20 2016      Assessment/Plan: s/p Procedure(s): TRANSMETATARSAL AMPUTATION (Right) Assessment: Stable status post transmetatarsal amputation.   Plan: Betadine gauze applied along the incision line of the right forefoot as well as the ulcerative areas on the right leg.  Patient was instructed to keep the foot bandage intact and clean and dry but she may change the bandages for the ulcerations on her right leg  every other day using a Betadine wet to dry gauze.  Patient had full understanding of how to change her bandage.  Patient did well with physical therapy going up and down steps yesterday.  Patient is stable for discharge from podiatry standpoint and I will follow-up with her in approximately 1 week for reevaluation.  LOS: 8 days    [name] 05/24/2020

## 2020-05-24 NOTE — Discharge Summary (Signed)
Physician Discharge Summary  Regina Carpenter NGE:[id number] DOB: [birth year] DOA: 05/16/2020  PCP: Emmaline Kluver., MD  Admit date: 05/16/2020 Discharge date: 05/24/2020  Discharge disposition: Home with home health care   Recommendations for Outpatient Follow-Up:   Follow-up with PCP in 1 week Follow-up with Dr. 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)   Essential hypertension   Smoking    Discharge Condition: Stable.  Diet recommendation:  Diet Order            Diet - low sodium heart healthy           Diet regular Room service appropriate? Yes; Fluid consistency: Thin  Diet effective now                   Code Status: Full Code     Hospital Course:   Regina Carpenter is a 44 y.o. 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  She said that she has known severe vasculopathy and over the past several months her lesions started as painful ulcers on multiple toes that is now progressed into a gangrenous state. The wounds became increasingly painful and had some foul-smelling drainage from the area as well.    Sepsis secondary to right wound infection (gangrene of right forefoot).  She was treated with analgesics and empiric IV antibiotics.  She was seen in consultation by the rheumatologist, Dr. Jefm Bryant, who recommended treatment with IV steroids and to continue with his home prednisone and Cytoxan at discharge.  He also recommended outpatient scheduled appointment for her IV cyclophosphamide.  She was also evaluated by the podiatrist.  She underwent right transmetatarsal amputation of right  forefoot on 05/20/2020.  Right forefoot surgical wound looks good on from podiatry standpoint, patient can be discharged home today.  Medically, she is deemed stable for discharge today.  She has leukocytosis but this is likely due to steroids.  She was evaluated by PT and OT recommended home health PT and a rolling walker.   Medical Consultants:    Podiatrist, Dr. Cleda Mccreedy  Rheumatologist, Dr. Jefm Bryant     Discharge Exam:    Vitals:   05/23/20 2016 05/24/20 0058 05/24/20 0511 05/24/20 0805  BP: (!) 128/92 132/87 118/79 116/81  Pulse: 91 99 72 84  Resp: 18 16 16 16   Temp: 98.3 F (36.8 C) 98.2 F (36.8 C) 98 F (36.7 C) 98.3 F (36.8 C)  TempSrc: Oral Oral Oral Oral  SpO2: 100% 99% 100% 97%  Weight:      Height:         GEN: NAD SKIN: No rash EYES: EOMI ENT: MMM CV: RRR PULM: CTA B ABD: soft, ND, NT, +BS CNS: AAO x 3, non focal EXT: Right forefoot stump surgical wound with staples looks clean and dry.  Gangrenous left fifth toe.  Multiple ulcers on the right distal leg.          The results of significant diagnostics from this hospitalization (including imaging, microbiology, ancillary and laboratory) are listed below for reference.     Procedures and Diagnostic Studies:   DG Foot 2 Views Right  Result Date: 05/16/2020 CLINICAL DATA:  Necrosis of  the second and fourth digit on the RIGHT foot. Necrosis of the fifth digit on the LEFT foot. EXAM: RIGHT FOOT - 2 VIEW COMPARISON:  None. FINDINGS: No acute fracture or dislocation. Joint spaces and alignment are maintained. No area of erosion or osseous destruction on these limited views. No unexpected radiopaque foreign body. There is lucency projecting over the second and fourth toes. This may reflect subcutaneous air versus air trapped underneath overlying bandage. Soft tissue edema. IMPRESSION: 1. No definitive radiographic evidence of osteomyelitis. 2. Lucency projecting over the second and fourth toes may reflect  subcutaneous air and/or air trapped underneath overlying bandage. Recommend correlation with physical exam. Electronically Signed   By: Valentino Saxon MD   On: 05/16/2020 13:09   DG Toe 5th Left  Result Date: 05/16/2020 CLINICAL DATA:  Necrosis of the fifth toe EXAM: DG TOE 5TH LEFT COMPARISON:  None. FINDINGS: Evaluation for fine osseous detail is limited secondary to overlying bandage. No acute fracture or dislocation. Joint spaces and alignment are maintained. No area of erosion or osseous destruction. No unexpected radiopaque foreign body. Soft tissues are unremarkable. IMPRESSION: No definitive radiographic evidence of osteomyelitis. Electronically Signed   By: Valentino Saxon MD   On: 05/16/2020 13:10     Labs:   Basic Metabolic Panel: Recent Labs  Lab 05/19/20 0340 05/19/20 0340 05/20/20 4259 05/20/20 5638 05/21/20 0443 05/21/20 0443 05/22/20 0402 05/23/20 0552  NA 138  --  136  --  137  --  140 137  K 3.8   < > 3.6   < > 4.0   < > 3.5 3.9  CL 103  --  106  --  107  --  109 106  CO2 24  --  23  --  24  --  24 26  GLUCOSE 79  --  86  --  115*  --  108* 74  BUN 7  --  10  --  10  --  12 13  CREATININE 0.75  --  0.66  --  0.53  --  0.69 0.59  CALCIUM 8.6*  --  8.5*  --  8.5*  --  8.6* 8.6*   < > = values in this interval not displayed.   GFR Estimated Creatinine Clearance: 99.5 mL/min (by C-G formula based on SCr of 0.59 mg/dL). Liver Function Tests: No results for input(s): AST, ALT, ALKPHOS, BILITOT, PROT, ALBUMIN in the last 168 hours. No results for input(s): LIPASE, AMYLASE in the last 168 hours. No results for input(s): AMMONIA in the last 168 hours. Coagulation profile No results for input(s): INR, PROTIME in the last 168 hours.  CBC: Recent Labs  Lab 05/19/20 0340 05/20/20 0433 05/21/20 0443 05/22/20 0402 05/23/20 0552  WBC 11.4* 11.5* 17.2* 20.0* 20.1*  NEUTROABS 5.8 6.6 14.2* 13.4* 12.3*  HGB 9.8* 9.3* 9.1* 9.1* 8.7*  HCT 32.2* 31.0* 29.5* 30.6*  29.3*  MCV 78.7* 81.2 77.8* 81.4 81.2  PLT 305 297 330 341 333   Cardiac Enzymes: No results for input(s): CKTOTAL, CKMB, CKMBINDEX, TROPONINI in the last 168 hours. BNP: Invalid input(s): POCBNP CBG: No results for input(s): GLUCAP in the last 168 hours. D-Dimer No results for input(s): DDIMER in the last 72 hours. Hgb A1c No results for input(s): HGBA1C in the last 72 hours. Lipid Profile No results for input(s): CHOL, HDL, LDLCALC, TRIG, CHOLHDL, LDLDIRECT in the last 72 hours. Thyroid function studies No results for input(s): TSH, T4TOTAL, T3FREE, THYROIDAB in the last 72 hours.  Invalid input(s): FREET3 Anemia work up No results for input(s): VITAMINB12, FOLATE, FERRITIN, TIBC, IRON, RETICCTPCT in the last 72 hours. Microbiology Recent Results (from the past 240 hour(s))  Blood culture (routine x 2)     Status: None   Collection Time: 05/16/20  3:49 PM   Specimen: BLOOD  Result Value Ref Range Status   Specimen Description BLOOD LEFT ANTECUBITAL  Final   Special Requests   Final    BOTTLES DRAWN AEROBIC AND ANAEROBIC Blood Culture results may not be optimal due to an inadequate volume of blood received in culture bottles   Culture   Final    NO GROWTH 5 DAYS Performed at [facility], [address], [place], [place] [address]    Report Status 05/21/2020 FINAL  Final  Blood culture (routine x 2)     Status: None   Collection Time: 05/16/20  3:54 PM   Specimen: BLOOD  Result Value Ref Range Status   Specimen Description BLOOD BLOOD RIGHT WRIST  Final   Special Requests   Final    BOTTLES DRAWN AEROBIC AND ANAEROBIC Blood Culture adequate volume   Culture   Final    NO GROWTH 5 DAYS Performed at [facility], [address], [place], [place] [address]    Report Status 05/21/2020 FINAL  Final  SARS Coronavirus 2 by RT PCR (hospital order, performed in [facility] hospital lab) Nasopharyngeal Nasopharyngeal Swab     Status: None   Collection Time:  05/16/20  5:46 PM   Specimen: Nasopharyngeal Swab  Result Value Ref Range Status   SARS Coronavirus 2 NEGATIVE NEGATIVE Final    Comment: (NOTE) SARS-CoV-2 target nucleic acids are NOT DETECTED.  The SARS-CoV-2 RNA is generally detectable in upper and lower respiratory specimens during the acute phase of infection. The lowest concentration of SARS-CoV-2 viral copies this assay can detect is 250 copies / mL. A negative result does not preclude SARS-CoV-2 infection and should not be used as the sole basis for treatment or other patient management decisions.  A negative result may occur with improper specimen collection / handling, submission of specimen other than nasopharyngeal swab, presence of viral mutation(s) within the areas targeted by this assay, and inadequate number of viral copies (<250 copies / mL). A negative result must be combined with clinical observations, patient history, and epidemiological information.  Fact Sheet for Patients:   StrictlyIdeas.no  Fact Sheet for Healthcare Providers: BankingDealers.co.za  This test is not yet approved or  cleared by the [place] FDA and has been authorized for detection and/or diagnosis of SARS-CoV-2 by FDA under an Emergency Use Authorization (EUA).  This EUA will remain in effect (meaning this test can be used) for the duration of the COVID-19 declaration under Section 564(b)(1) of the Act, 21 U.S.C. section 360bbb-3(b)(1), unless the authorization is terminated or revoked sooner.  Performed at [facility], [address] [place], [place] [address]   Culture, blood (single)     Status: None   Collection Time: 05/16/20  8:52 PM   Specimen: BLOOD  Result Value Ref Range Status   Specimen Description BLOOD RIGHT ANTECUBITAL  Final   Special Requests   Final    BOTTLES DRAWN AEROBIC AND ANAEROBIC Blood Culture adequate volume   Culture   Final    NO GROWTH 5  DAYS Performed at [facility], [address] [place], [place] [address]    Report Status 05/21/2020 FINAL  Final  Surgical PCR screen  Status: Abnormal   Collection Time: 05/19/20  2:45 AM   Specimen: Nasal Mucosa; Nasal Swab  Result Value Ref Range Status   MRSA, PCR POSITIVE (A) NEGATIVE Final    Comment: RESULT CALLED TO, READ BACK BY AND VERIFIED WITH: TINA POTEAT 05/19/20 AT 0436 HS    Staphylococcus aureus POSITIVE (A) NEGATIVE Final    Comment: (NOTE) The Xpert SA Assay (FDA approved for NASAL specimens in patients 42 years of age and older), is one component of a comprehensive surveillance program. It is not intended to diagnose infection nor to guide or monitor treatment. Performed at [facility], [address], [place], [place] [address]      Discharge Instructions:   Discharge Instructions    Diet - low sodium heart healthy   Complete by: As directed    Discharge wound care:   Complete by: As directed    Follow up with Dr. Cleda Mccreedy in 1 week to remove dressing   Face-to-face encounter (required for Medicare/Medicaid patients)   Complete by: As directed    I Josias Tomerlin certify that this patient is under my care and that I, or a nurse practitioner or physician's assistant working with me, had a face-to-face encounter that meets the physician face-to-face encounter requirements with this patient on 05/23/2020. The encounter with the patient was in whole, or in part for the following medical condition(s) which is the primary reason for home health care (List medical condition): Gangrenous right toes  s/p right transmetatarsal amputation   The encounter with the patient was in whole, or in part, for the following medical condition, which is the primary reason for home health care: Gangrenous right toes s/p right transmetatarsal amputation   I certify that, based on my findings, the following services are medically necessary home health services:   Physical therapy Nursing     Reason for Medically Necessary Home Health Services:  Skilled Nursing- Complex Wound Care Therapy- Gait Training, Transfer Training and Stair Training     My clinical findings support the need for the above services:  Unable to leave home safely without assistance and/or assistive device Pain interferes with ambulation/mobility     Further, I certify that my clinical findings support that this patient is homebound due to:  Pain interferes with ambulation/mobility Unable to leave home safely without assistance     For home use only DME 3 n 1   Complete by: As directed    For home use only DME Walker rolling   Complete by: As directed    s/p right transmetatarsal amputation   Walker: With [place]   Patient needs a walker to treat with the following condition: Amputation of right foot ([place])   Home Health   Complete by: As directed    To provide the following care/treatments:  PT RN     Increase activity slowly   Complete by: As directed      Allergies as of 05/24/2020      Reactions   Ibuprofen Other (See Comments)   Pt states that she sweats profusely after taking ibuprofen Other reaction(s): Other (See Comments) "sweating:"   Sulfasalazine Rash      Medication List    STOP taking these medications   Methotrexate 25 MG/ML Sosy   NIFEdipine 30 MG 24 hr tablet Commonly known as: PROCARDIA-XL/NIFEDICAL-XL     TAKE these medications   amLODipine 2.5 MG tablet Commonly known as: NORVASC Take 2.5 mg by mouth daily.   clindamycin  300 MG capsule Commonly known as: CLEOCIN Take 1 capsule (300 mg total) by mouth every 8 (eight) hours for 5 days.   cyclophosphamide 50 MG capsule Commonly known as: CYTOXAN Take 50 mg by mouth daily. Take with food to minimize GI upset. Take early in the day and maintain hydration.   oxyCODONE-acetaminophen 5-325 MG tablet Commonly known as: Percocet Take 1 tablet by mouth every 6 (six) hours as needed  for severe pain.   predniSONE 20 MG tablet Commonly known as: DELTASONE Take 40 mg by mouth daily with breakfast.            Durable Medical Equipment  (From admission, onward)         Start     Ordered   05/23/20 0000  For home use only DME Walker rolling       Comments: s/p right transmetatarsal amputation  Question Answer Comment  Walker: With [place]   Patient needs a walker to treat with the following condition Amputation of right foot ([place])      05/23/20 1607   05/23/20 0000  For home use only DME 3 n 1        05/23/20 1607           Discharge Care Instructions  (From admission, onward)         Start     Ordered   05/24/20 0000  Discharge wound care:       Comments: Follow up with Dr. Cleda Mccreedy in 1 week to remove dressing   05/24/20 1035          Follow-up Information    Sharlotte Alamo, DPM. Schedule an appointment as soon as possible for a visit in 5 day(s).   Specialty: Podiatry Contact information: [place] [phone]                Time coordinating discharge: 32 minutes  Signed:  Jennye Boroughs  [facility] 05/24/2020, 10:35 AM   Pager on [email or web address]. If 7PM-7AM, please contact night-coverage at [email or web address]

## 2020-05-24 NOTE — Progress Notes (Signed)
Patient discharging home. Instructions given to patient, verbalized understanding. Waiting on family to come transport patient home.

## 2020-06-10 DIAGNOSIS — H15001 Unspecified scleritis, right eye: Secondary | ICD-10-CM

## 2020-06-10 DIAGNOSIS — R590 Localized enlarged lymph nodes: Secondary | ICD-10-CM

## 2020-07-17 ENCOUNTER — Inpatient Hospital Stay
Admission: EM | Admit: 2020-07-17 | Discharge: 2020-07-25 | DRG: 872 | Disposition: A | Discharge status: Home Health | Payer: 59 | Attending: Internal Medicine | Admitting: Internal Medicine

## 2020-07-17 ENCOUNTER — Encounter: Payer: Self-pay | Admitting: Emergency Medicine

## 2020-07-17 ENCOUNTER — Inpatient Hospital Stay: Payer: 59

## 2020-07-17 ENCOUNTER — Emergency Department: Payer: 59

## 2020-07-17 ENCOUNTER — Other Ambulatory Visit: Payer: Self-pay

## 2020-07-17 DIAGNOSIS — L03115 Cellulitis of right lower limb: Secondary | ICD-10-CM | POA: Diagnosis present

## 2020-07-17 DIAGNOSIS — Z89421 Acquired absence of other right toe(s): Secondary | ICD-10-CM

## 2020-07-17 DIAGNOSIS — Z20822 Contact with and (suspected) exposure to covid-19: Secondary | ICD-10-CM | POA: Diagnosis present

## 2020-07-17 DIAGNOSIS — L03116 Cellulitis of left lower limb: Secondary | ICD-10-CM | POA: Diagnosis present

## 2020-07-17 DIAGNOSIS — L97912 Non-pressure chronic ulcer of unspecified part of right lower leg with fat layer exposed: Secondary | ICD-10-CM | POA: Diagnosis not present

## 2020-07-17 DIAGNOSIS — Z720 Tobacco use: Secondary | ICD-10-CM | POA: Diagnosis present

## 2020-07-17 DIAGNOSIS — M300 Polyarteritis nodosa: Secondary | ICD-10-CM | POA: Diagnosis present

## 2020-07-17 DIAGNOSIS — F17210 Nicotine dependence, cigarettes, uncomplicated: Secondary | ICD-10-CM | POA: Diagnosis present

## 2020-07-17 DIAGNOSIS — Z886 Allergy status to analgesic agent status: Secondary | ICD-10-CM | POA: Diagnosis not present

## 2020-07-17 DIAGNOSIS — L97519 Non-pressure chronic ulcer of other part of right foot with unspecified severity: Secondary | ICD-10-CM | POA: Diagnosis present

## 2020-07-17 DIAGNOSIS — I96 Gangrene, not elsewhere classified: Secondary | ICD-10-CM | POA: Diagnosis present

## 2020-07-17 DIAGNOSIS — I776 Arteritis, unspecified: Secondary | ICD-10-CM | POA: Diagnosis not present

## 2020-07-17 DIAGNOSIS — B965 Pseudomonas (aeruginosa) (mallei) (pseudomallei) as the cause of diseases classified elsewhere: Secondary | ICD-10-CM | POA: Diagnosis present

## 2020-07-17 DIAGNOSIS — L97921 Non-pressure chronic ulcer of unspecified part of left lower leg limited to breakdown of skin: Secondary | ICD-10-CM | POA: Diagnosis not present

## 2020-07-17 DIAGNOSIS — M064 Inflammatory polyarthropathy: Secondary | ICD-10-CM | POA: Diagnosis present

## 2020-07-17 DIAGNOSIS — I10 Essential (primary) hypertension: Secondary | ICD-10-CM | POA: Diagnosis present

## 2020-07-17 DIAGNOSIS — Z79899 Other long term (current) drug therapy: Secondary | ICD-10-CM | POA: Diagnosis not present

## 2020-07-17 DIAGNOSIS — M318 Other specified necrotizing vasculopathies: Secondary | ICD-10-CM | POA: Diagnosis present

## 2020-07-17 DIAGNOSIS — R3129 Other microscopic hematuria: Secondary | ICD-10-CM | POA: Diagnosis not present

## 2020-07-17 DIAGNOSIS — E669 Obesity, unspecified: Secondary | ICD-10-CM | POA: Diagnosis present

## 2020-07-17 DIAGNOSIS — Z7952 Long term (current) use of systemic steroids: Secondary | ICD-10-CM | POA: Diagnosis not present

## 2020-07-17 DIAGNOSIS — M069 Rheumatoid arthritis, unspecified: Secondary | ICD-10-CM | POA: Diagnosis present

## 2020-07-17 DIAGNOSIS — L97529 Non-pressure chronic ulcer of other part of left foot with unspecified severity: Secondary | ICD-10-CM | POA: Diagnosis present

## 2020-07-17 DIAGNOSIS — A419 Sepsis, unspecified organism: Secondary | ICD-10-CM | POA: Diagnosis present

## 2020-07-17 DIAGNOSIS — Z6834 Body mass index (BMI) 34.0-34.9, adult: Secondary | ICD-10-CM | POA: Diagnosis not present

## 2020-07-17 DIAGNOSIS — R509 Fever, unspecified: Secondary | ICD-10-CM | POA: Diagnosis present

## 2020-07-17 DIAGNOSIS — Z882 Allergy status to sulfonamides status: Secondary | ICD-10-CM | POA: Diagnosis not present

## 2020-07-17 DIAGNOSIS — Z716 Tobacco abuse counseling: Secondary | ICD-10-CM | POA: Diagnosis not present

## 2020-07-17 DIAGNOSIS — G8929 Other chronic pain: Secondary | ICD-10-CM | POA: Diagnosis present

## 2020-07-17 DIAGNOSIS — L97922 Non-pressure chronic ulcer of unspecified part of left lower leg with fat layer exposed: Secondary | ICD-10-CM | POA: Diagnosis not present

## 2020-07-17 LAB — COMPREHENSIVE METABOLIC PANEL
ALT: 11 U/L (ref 0–44)
AST: 14 U/L — ABNORMAL LOW (ref 15–41)
Albumin: 3.7 g/dL (ref 3.5–5.0)
Alkaline Phosphatase: 67 U/L (ref 38–126)
Anion gap: 11 (ref 5–15)
BUN: 8 mg/dL (ref 6–20)
CO2: 26 mmol/L (ref 22–32)
Calcium: 9.1 mg/dL (ref 8.9–10.3)
Chloride: 101 mmol/L (ref 98–111)
Creatinine, Ser: 0.75 mg/dL (ref 0.44–1.00)
GFR, Estimated: >60 mL/min (ref >60)
Glucose, Bld: 88 mg/dL (ref 70–99)
Potassium: 3.6 mmol/L (ref 3.5–5.1)
Sodium: 138 mmol/L (ref 135–145)
Total Bilirubin: 0.6 mg/dL (ref 0.3–1.2)
Total Protein: 7.8 g/dL (ref 6.5–8.1)

## 2020-07-17 LAB — BASIC METABOLIC PANEL
Anion gap: 11 (ref 5–15)
BUN: 6 mg/dL (ref 6–20)
CO2: 24 mmol/L (ref 22–32)
Calcium: 8.8 mg/dL — ABNORMAL LOW (ref 8.9–10.3)
Chloride: 102 mmol/L (ref 98–111)
Creatinine, Ser: 0.81 mg/dL (ref 0.44–1.00)
GFR, Estimated: >60 mL/min (ref >60)
Glucose, Bld: 90 mg/dL (ref 70–99)
Potassium: 3.7 mmol/L (ref 3.5–5.1)
Sodium: 137 mmol/L (ref 135–145)

## 2020-07-17 LAB — CBC WITH DIFFERENTIAL/PLATELET
Abs Immature Granulocytes: 0.21 10*3/uL — ABNORMAL HIGH (ref 0.00–0.07)
Basophils Absolute: 0.1 10*3/uL (ref 0.0–0.1)
Basophils Relative: 0 %
Eosinophils Absolute: 0.1 10*3/uL (ref 0.0–0.5)
Eosinophils Relative: 1 %
HCT: 36.1 % (ref 36.0–46.0)
Hemoglobin: 10.7 g/dL — ABNORMAL LOW (ref 12.0–15.0)
Immature Granulocytes: 1 %
Lymphocytes Relative: 20 %
Lymphs Abs: 4.2 10*3/uL — ABNORMAL HIGH (ref 0.7–4.0)
MCH: 23 pg — ABNORMAL LOW (ref 26.0–34.0)
MCHC: 29.6 g/dL — ABNORMAL LOW (ref 30.0–36.0)
MCV: 77.6 fL — ABNORMAL LOW (ref 80.0–100.0)
Monocytes Absolute: 1.4 10*3/uL — ABNORMAL HIGH (ref 0.1–1.0)
Monocytes Relative: 7 %
Neutro Abs: 14.9 10*3/uL — ABNORMAL HIGH (ref 1.7–7.7)
Neutrophils Relative %: 71 %
Platelets: 303 10*3/uL (ref 150–400)
RBC: 4.65 MIL/uL (ref 3.87–5.11)
RDW: 18.5 % — ABNORMAL HIGH (ref 11.5–15.5)
Smear Review: NORMAL
WBC: 20.9 10*3/uL — ABNORMAL HIGH (ref 4.0–10.5)
nRBC: 0 % (ref 0.0–0.2)

## 2020-07-17 LAB — LACTIC ACID, PLASMA
Lactic Acid, Venous: 2.3 mmol/L (ref 0.5–1.9)
Lactic Acid, Venous: 3.1 mmol/L (ref 0.5–1.9)
Lactic Acid, Venous: 1.7 mmol/L (ref 0.5–1.9)

## 2020-07-17 LAB — RESPIRATORY PANEL BY RT PCR (FLU A&B, COVID)
Influenza A by PCR: NEGATIVE
Influenza B by PCR: NEGATIVE
SARS Coronavirus 2 by RT PCR: NEGATIVE

## 2020-07-17 LAB — SEDIMENTATION RATE: Sed Rate: 27 mm/hr — ABNORMAL HIGH (ref 0–20)

## 2020-07-17 LAB — C-REACTIVE PROTEIN: CRP: 3.1 mg/dL — ABNORMAL HIGH (ref <1.0)

## 2020-07-17 MED ORDER — HYDROMORPHONE HCL 1 MG/ML IJ SOLN
1.0000 mg | Freq: Once | INTRAMUSCULAR | Status: AC
  Administered 2020-07-17: 1 mg via INTRAVENOUS
  Filled 2020-07-17: qty 1

## 2020-07-17 MED ORDER — ONDANSETRON HCL 4 MG PO TABS: 4.0000 mg | ORAL_TABLET | Freq: Four times a day (QID) | ORAL | Status: DC | PRN

## 2020-07-17 MED ORDER — HYDROMORPHONE HCL 1 MG/ML IJ SOLN
1.0000 mg | INTRAMUSCULAR | Status: DC | PRN
  Administered 2020-07-17 – 2020-07-25 (×33): 1 mg via INTRAVENOUS
  Filled 2020-07-17 (×36): qty 1

## 2020-07-17 MED ORDER — ONDANSETRON HCL 4 MG/2ML IJ SOLN
4.0000 mg | Freq: Four times a day (QID) | INTRAMUSCULAR | Status: DC | PRN
  Administered 2020-07-17 – 2020-07-24 (×3): 4 mg via INTRAVENOUS
  Filled 2020-07-17 (×3): qty 2

## 2020-07-17 MED ORDER — ACETAMINOPHEN 325 MG PO TABS
650.0000 mg | ORAL_TABLET | Freq: Once | ORAL | Status: AC
  Administered 2020-07-17: 650 mg via ORAL
  Filled 2020-07-17: qty 2

## 2020-07-17 MED ORDER — LACTATED RINGERS IV BOLUS (SEPSIS)
1000.0000 mL | Freq: Once | INTRAVENOUS | Status: AC
  Administered 2020-07-17: 1000 mL via INTRAVENOUS

## 2020-07-17 MED ORDER — SODIUM CHLORIDE 0.9 % IV SOLN: INTRAVENOUS | Status: DC

## 2020-07-17 MED ORDER — LACTATED RINGERS IV SOLN: INTRAVENOUS | Status: AC

## 2020-07-17 MED ORDER — METHYLPREDNISOLONE SODIUM SUCC 40 MG IJ SOLR
40.0000 mg | Freq: Two times a day (BID) | INTRAMUSCULAR | Status: DC
  Administered 2020-07-17 – 2020-07-25 (×16): 40 mg via INTRAVENOUS
  Filled 2020-07-17 (×16): qty 1

## 2020-07-17 MED ORDER — NICOTINE 14 MG/24HR TD PT24
14.0000 mg | MEDICATED_PATCH | Freq: Every day | TRANSDERMAL | Status: DC
  Administered 2020-07-17 – 2020-07-25 (×9): 14 mg via TRANSDERMAL
  Filled 2020-07-17 (×9): qty 1

## 2020-07-17 MED ORDER — ACETAMINOPHEN 325 MG PO TABS
650.0000 mg | ORAL_TABLET | Freq: Four times a day (QID) | ORAL | Status: DC | PRN
  Administered 2020-07-17 – 2020-07-23 (×2): 650 mg via ORAL
  Filled 2020-07-17 (×2): qty 2

## 2020-07-17 MED ORDER — VANCOMYCIN HCL IN DEXTROSE 1-5 GM/200ML-% IV SOLN
1000.0000 mg | Freq: Once | INTRAVENOUS | Status: AC
  Administered 2020-07-17: 1000 mg via INTRAVENOUS

## 2020-07-17 MED ORDER — ENOXAPARIN SODIUM 60 MG/0.6ML ~~LOC~~ SOLN
0.5000 mg/kg | SUBCUTANEOUS | Status: DC
  Filled 2020-07-17: qty 0.6

## 2020-07-17 MED ORDER — METRONIDAZOLE IN NACL 5-0.79 MG/ML-% IV SOLN
500.0000 mg | Freq: Once | INTRAVENOUS | Status: AC
  Administered 2020-07-17: 500 mg via INTRAVENOUS
  Filled 2020-07-17: qty 100

## 2020-07-17 MED ORDER — ONDANSETRON HCL 4 MG/2ML IJ SOLN
4.0000 mg | Freq: Once | INTRAMUSCULAR | Status: AC
  Administered 2020-07-17: 4 mg via INTRAVENOUS
  Filled 2020-07-17: qty 2

## 2020-07-17 MED ORDER — PREDNISONE 20 MG PO TABS: 40.0000 mg | ORAL_TABLET | Freq: Every day | ORAL | Status: DC

## 2020-07-17 MED ORDER — LACTATED RINGERS IV SOLN: INTRAVENOUS | Status: DC

## 2020-07-17 MED ORDER — SODIUM CHLORIDE 0.9 % IV SOLN
2.0000 g | Freq: Once | INTRAVENOUS | Status: AC
  Administered 2020-07-17: 2 g via INTRAVENOUS
  Filled 2020-07-17: qty 2

## 2020-07-17 MED ORDER — VANCOMYCIN HCL 750 MG/150ML IV SOLN
750.0000 mg | Freq: Two times a day (BID) | INTRAVENOUS | Status: DC
  Administered 2020-07-18 (×2): 750 mg via INTRAVENOUS
  Filled 2020-07-17 (×5): qty 150

## 2020-07-17 MED ORDER — AMLODIPINE BESYLATE 5 MG PO TABS
2.5000 mg | ORAL_TABLET | Freq: Every day | ORAL | Status: DC
  Administered 2020-07-17 – 2020-07-21 (×5): 2.5 mg via ORAL
  Filled 2020-07-17 (×5): qty 1

## 2020-07-17 MED ORDER — ENOXAPARIN SODIUM 40 MG/0.4ML ~~LOC~~ SOLN: 40.0000 mg | SUBCUTANEOUS | Status: DC

## 2020-07-17 MED ORDER — SODIUM CHLORIDE 0.9 % IV SOLN
1.0000 g | INTRAVENOUS | Status: DC
  Administered 2020-07-17 – 2020-07-22 (×6): 1 g via INTRAVENOUS
  Filled 2020-07-17: qty 10
  Filled 2020-07-17 (×4): qty 1
  Filled 2020-07-17: qty 10
  Filled 2020-07-17: qty 1

## 2020-07-17 MED ORDER — MORPHINE SULFATE (PF) 4 MG/ML IV SOLN
4.0000 mg | Freq: Once | INTRAVENOUS | Status: AC
  Administered 2020-07-17: 4 mg via INTRAVENOUS
  Filled 2020-07-17: qty 1

## 2020-07-17 MED ORDER — CYCLOPHOSPHAMIDE 50 MG PO CAPS
50.0000 mg | ORAL_CAPSULE | Freq: Every day | ORAL | Status: DC
  Administered 2020-07-17: 50 mg via ORAL
  Filled 2020-07-17 (×3): qty 1

## 2020-07-17 MED ORDER — VANCOMYCIN HCL IN DEXTROSE 1-5 GM/200ML-% IV SOLN
1000.0000 mg | Freq: Once | INTRAVENOUS | Status: AC
  Administered 2020-07-17: 1000 mg via INTRAVENOUS
  Filled 2020-07-17: qty 200

## 2020-07-17 NOTE — ED Notes (Addendum)
Report attempt x 1 

## 2020-07-17 NOTE — Progress Notes (Signed)
CODE SEPSIS - PHARMACY COMMUNICATION  **Broad Spectrum Antibiotics should be administered within 1 hour of Sepsis diagnosis**  Time Code Sepsis Called/Page Received: 0510  Antibiotics Ordered: Vancomycin, Flagyl and Cefepime  Time of 1st antibiotic administration: 0544  Additional action taken by pharmacy: n/a  If necessary, Name of Provider/Nurse Contacted: n/a   Ena Dawley ,PharmD Clinical Pharmacist  07/17/2020  5:10 AM

## 2020-07-17 NOTE — Progress Notes (Signed)
Patient seen in consultation by Dr. Jefm Bryant from rheumatology who recommends transfer to tertiary center for further evaluation and treatment of her vasculitis.  ER physician, Dr. Joni Fears attempted transfer to [facility] but there are no beds available at this time.  Patient is on a wait list.

## 2020-07-17 NOTE — Consult Note (Signed)
Reason for Consult: Vasculitis  Referring Physician: Emergency room  Regina Carpenter   HPI: 44 year old African-American female.  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  Several months ago she developed ischemic toes as well as ulcerative lesions in lower extremities.  She had a biopsy that showed medium vessel vasculitis.  She had negative ANA and ANCA and cryoglobulins and hepatitis B and C.  She had normal renal function and no obvious pulmonary disease.  Lupus anticoagulant and anticardiolipin antibodies were negative.  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  She was started on prednisone and methotrexate which was switched to injection.  She had no significant response and was started on Cytoxan.  She was seen x1 by Marian Sorrow of the [facility] vasculitis clinic who agreed with Cytoxan treatment.  She was set up for IV Cytoxan She developed ischemia worsening in the right foot requiring transmetatarsal amputation.  In the last 2 weeks she has developed new ulcers and the right and left leg despite prednisone and Cytoxan.  Dry gangrene of the left foot.  She has not had shortness of breath or abdominal pain.  She has not had any lesions in her hands. She presented to the ER with new lower extremity lesions, fever and worsening pain in her feet  PMH: Medium vessel vasculitis. Connective tissue disease with prior uveitis, mesenteric adenitis.  HLA-B27 negative.  IgG4 negative.  Prior methotrexate and prednisone   SURGICAL HISTORY: Transmetatarsal right foot amputation  Family History: Negative for connective tissue disease  Social History: No significant cigarettes or alcohol.  No significant IV drug use  Allergies:  Allergies  Allergen Reactions  . Ibuprofen Other (See Comments)    Pt states that she sweats profusely after taking ibuprofen Other reaction(s): Other (See Comments) "sweating:"  . Sulfasalazine Rash     Medications:  Scheduled: . amLODipine  2.5 mg Oral Daily  . cyclophosphamide  50 mg Oral Daily  . enoxaparin (LOVENOX) injection  0.5 mg/kg Subcutaneous Q24H  . nicotine  14 mg Transdermal Daily  . [START ON 07/18/2020] predniSONE  40 mg Oral Q breakfast        ROS: No recent chest pain or abdominal pain.  No recent joint pain.  No new rashes.  Some fever.   PHYSICAL EXAM: Blood pressure (!) 146/102, pulse (!) 126, temperature 99.5 F (37.5 C), temperature source Oral, resp. rate (!) 22, height 5' 5" (1.651 m), weight 93.4 kg, last menstrual period 07/17/2020, SpO2 99 %. Somnolent female.  Just had a IV pain meds Numerous ulcerative lesions in both lower extremities.  Dry gangrene with ischemia of the left fifth and second toes.  No significant drainage from the wound on the right foot.  Fingers without telangiectasias or ischemic changes.  Sclera clear.  Clear chest.  No murmur.  Nontender abdomen.  1+ edema.  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  No improvement with prednisone, methotrexate, Cytoxan.  Now with worsening ulcerative and ischemic lesions in the lower extremities  Prior connective tissue disease with uveitis, inflammatory arthritis.  Status post methotrexate and prednisone  Recommendations: Recommend transfer to [facility].  She will need further consultation regarding her vasculitis and the next step forward having failed steroids, methotrexate and cyclophosphamide  Hypertension is concerning for renal involvement though creatinine is normal  Needs sed rate, CRP and urinalysis  Liliane Bade W 07/17/2020, 1:17 PM

## 2020-07-17 NOTE — ED Notes (Signed)
XRAY  POWERSHARE  WITH  [facility] 

## 2020-07-17 NOTE — ED Notes (Signed)
Pt with open areas to bilateral lower ext, multiple stages of healing. Pt in pain when awake, ambulates with assistance to toilet.

## 2020-07-17 NOTE — ED Notes (Signed)
[facility]  CALLED  PER  DR  Joni Fears   MD

## 2020-07-17 NOTE — ED Provider Notes (Signed)
[facility] Emergency Department Provider Note  ____________________________________________   First MD Initiated Contact with Patient 07/17/20 [phone]     (approximate)  I have reviewed the triage vital signs and the nursing notes.   HISTORY  Chief Complaint Leg Pain    HPI Regina Carpenter is a 44 y.o. female with below list of previous medical conditions including collagen vascular disease systemic vasculitis hypertension recent hospital admission on 05/16/2020 of sepsis and ischemic toes on bilateral lower extremity presents to the emergency department secondary to 10 out of 10 bilateral lower extremity x2 days with swelling noted to the left foot.  Patient states that she is currently taking antibiotics however does not recall what antibiotics.  Patient also admits to subjective fever.       Past Medical History:  Diagnosis Date  . Arthralgia of right knee   . Arthritis   . Cellulitis of left leg   . Collagen vascular disease ([place])   . Lymphadenopathy, mesenteric   . Right hand pain   . Tobacco abuse     Patient Active Problem List   Diagnosis Date Noted  . Bilateral lower leg cellulitis 07/17/2020  . 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 Rheumatoid arthritis ([place]) 05/17/2020  . 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 Essential hypertension 05/17/2020  . Smoking 05/17/2020  . Sepsis ([place]) 05/16/2020  . Hyponatremia 11/25/2016  . Hypokalemia 11/25/2016  . Leukocytosis 11/25/2016  . Generalized weakness 11/25/2016  . Generalized pain 11/25/2016  . Conjunctivitis 11/25/2016  . Scleritis and episcleritis of right eye   . 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 Abdominal pain 11/14/2016  . Lymphadenopathy, mesenteric   . Arthralgia of right knee 04/02/2015  . Cellulitis of left leg 03/21/2015  . Right hand pain 03/21/2015  . Tobacco use 03/21/2015    Past Surgical History:  Procedure Laterality Date  . denies     . LOWER EXTREMITY ANGIOGRAPHY Bilateral 05/19/2020   Procedure: Lower Extremity Angiography;  Surgeon: Algernon Huxley, MD;  Location: [place] [facility];  Service: Cardiovascular;  Laterality: Bilateral;  . 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 Authorizing Provider  amLODipine (NORVASC) 2.5 MG tablet Take 2.5 mg by mouth daily.    [provider]  cyclophosphamide (CYTOXAN) 50 MG capsule Take 50 mg by mouth daily. Take with food to minimize GI upset. Take early in the day and maintain hydration.    [provider]  oxyCODONE-acetaminophen (PERCOCET) 5-325 MG tablet Take 1 tablet by mouth every 6 (six) hours as needed for severe pain. 04/07/20   Johnn Hai, PA-C  predniSONE (DELTASONE) 20 MG tablet Take 40 mg by mouth daily with breakfast.     [provider]    Allergies Ibuprofen and Sulfasalazine  Family History  Problem Relation Age of Onset  . Leukemia Maternal Grandfather   . Alcohol abuse Father     Social History Social History   Tobacco Use  . Smoking status: Current Every Day Smoker    Packs/day: 0.50    Types: Cigarettes  . Smokeless tobacco: Never Used  Vaping Use  . Vaping Use: Never used  Substance Use Topics  . Alcohol use: No  . Drug use: No    Review of Systems Constitutional: Positive for  fever/chills Eyes: No visual changes. ENT: No sore throat. Cardiovascular: Denies chest pain. Respiratory: Denies shortness of breath. Gastrointestinal: No abdominal pain.  No nausea, no vomiting.  No diarrhea.  No constipation. Genitourinary: Negative for dysuria. Musculoskeletal: Positive for bilateral lower extremity pain and swelling negative for neck pain.  Negative for back pain. Integumentary: Negative for  rash. Neurological: Negative for headaches, focal weakness or numbness.   ____________________________________________   PHYSICAL EXAM:  VITAL SIGNS: ED Triage Vitals  Enc Vitals Group     BP 07/17/20 0307 (!) 163/100     Pulse Rate 07/17/20 0307 (!) 118     Resp 07/17/20 0307 18     Temp 07/17/20 0307 99.3 F (37.4 C)     Temp Source 07/17/20 0307 Oral     SpO2 07/17/20 0307 100 %     Weight 07/17/20 0308 93.4 kg (206 lb)     Height 07/17/20 0308 1.651 m (_0 )     Head Circumference --      Peak Flow --      Pain Score 07/17/20 0308 10     Pain Loc --      Pain Edu? --      Excl. in [place]? --     Constitutional: Alert and oriented.  Apparent discomfort Eyes: Conjunctivae are normal.  Head: Atraumatic. Mouth/Throat: Patient is wearing a mask. Neck: No stridor.  No meningeal signs.   Cardiovascular: Normal rate, regular rhythm. Good peripheral circulation. Grossly normal heart sounds. Respiratory: Normal respiratory effort.  No retractions. Gastrointestinal: Soft and nontender. No distention.  Musculoskeletal:        Neurologic:  Normal speech and language. No gross focal neurologic deficits are appreciated.  Skin: Left foot tenderness to touch, hot blanching erythema pictures as depicted above. Psychiatric: Mood and affect are normal. Speech and behavior are normal.  ____________________________________________   LABS (all labs ordered are listed, but only abnormal results are displayed)  Labs Reviewed  CBC WITH DIFFERENTIAL/PLATELET - Abnormal; Notable for the following components:      Result Value   WBC 20.9 (*)    Hemoglobin 10.7 (*)    MCV 77.6 (*)    MCH 23.0 (*)    MCHC 29.6 (*)    RDW 18.5 (*)    Neutro Abs 14.9 (*)    Lymphs Abs 4.2 (*)    Monocytes Absolute 1.4 (*)    Abs Immature Granulocytes 0.21 (*)    All other components within normal limits  COMPREHENSIVE METABOLIC PANEL - Abnormal; Notable for the following components:   AST 14 (*)     All other components within normal limits  LACTIC ACID, PLASMA - Abnormal; Notable for the following components:   Lactic Acid, Venous 2.3 (*)    All other components within normal limits  LACTIC ACID, PLASMA - Abnormal; Notable for the following components:   Lactic Acid, Venous 3.1 (*)    All other components within normal limits  CULTURE, BLOOD (ROUTINE X 2)  CULTURE, BLOOD (ROUTINE X 2)  LACTIC ACID, PLASMA  POC URINE PREG, ED   ____________________________________________  EKG  ED ECG REPORT I, [place] N Dia Jefferys, the attending physician, personally viewed and interpreted this ECG.   Date: 07/17/2020  EKG Time: 6:01 AM  Rate: 128  Rhythm: Sinus tachycardia  Axis: Normal  Intervals: Normal  ST&T Change: None  ____________________________________________  RADIOLOGY I, Travis N Elmira Olkowski, personally viewed and evaluated these images (plain radiographs) as part of my medical decision making, as  well as reviewing the written report by the radiologist.    Official radiology report(s): DG Foot Complete Left  Result Date: 07/17/2020 CLINICAL DATA:  Cellulitis, concern for osteomyelitis EXAM: LEFT FOOT - COMPLETE 3+ VIEW COMPARISON:  None. FINDINGS: No fracture or dislocation is seen. The joint spaces are preserved. The visualized soft tissues are unremarkable. No osseous destruction to suggest osteomyelitis. IMPRESSION: No radiographic findings to suggest osteomyelitis. Electronically Signed   By: Julian Hy M.D.   On: 07/17/2020 06:13   DG Foot Complete Right  Result Date: 07/17/2020 CLINICAL DATA:  Cellulitis.  Concern for osteomyelitis. EXAM: RIGHT FOOT COMPLETE - 3+ VIEW COMPARISON:  Right foot radiographs 05/16/2020 FINDINGS: Forefoot amputation at the level of the metatarsals is noted. Soft tissue swelling is present anterior to the osteotomy sites. Residual metatarsals are within normal limits. No osseous erosion is present. No gas is present in the soft tissues.  Midfoot and hindfoot are within normal limits. IMPRESSION: 1. Forefoot amputation without radiographic evidence for osteomyelitis. 2. Soft tissue swelling anterior to the osteotomy sites. Electronically Signed   By: San Morelle M.D.   On: 07/17/2020 06:13    ____________________________________________   PROCEDURES    .Critical Care Performed by: Gregor Hams, MD Authorized by: Gregor Hams, MD   Critical care provider statement:    Critical care time (minutes):  30   Critical care time was exclusive of:  Separately billable procedures and treating other patients   Critical care was necessary to treat or prevent imminent or life-threatening deterioration of the following conditions:  Sepsis   Critical care was time spent personally by me on the following activities:  Development of treatment plan with patient or surrogate, discussions with consultants, evaluation of patient's response to treatment, examination of patient, obtaining history from patient or surrogate, ordering and performing treatments and interventions, ordering and review of laboratory studies, ordering and review of radiographic studies, pulse oximetry, re-evaluation of patient's condition and review of old charts     ____________________________________________   INITIAL IMPRESSION / MDM / [place] / ED COURSE  As part of my medical decision making, I reviewed the following data within the electronic MEDICAL RECORD NUMBER  44 year old female presented with above-stated history and physical exam consistent with necrotic ulcers on the left lower extremity with associated cellulitis.  Patient met sepsis criteria and as such protocol was initiated.  Patient given appropriate IV antibiotic therapy.  Patient given IV morphine 4 mg secondary to 10 out of 10 pain with improvement of pain on reevaluation. Patient discussed with Dr Damita Dunnings for admission for further evaluation and  management  ____________________________________________  FINAL CLINICAL IMPRESSION(S) / ED DIAGNOSES  Final diagnoses:  Cellulitis of left foot  Sepsis, due to unspecified organism, unspecified whether acute organ dysfunction present [facility])     MEDICATIONS GIVEN DURING THIS VISIT:  Medications  lactated ringers infusion ( Intravenous New Bag/Given 07/17/20 0541)  metroNIDAZOLE (FLAGYL) IVPB 500 mg (500 mg Intravenous New Bag/Given 07/17/20 0610)  vancomycin (VANCOCIN) IVPB 1000 mg/200 mL premix (has no administration in time range)  lactated ringers infusion (has no administration in time range)  lactated ringers bolus 1,000 mL (has no administration in time range)    And  lactated ringers bolus 1,000 mL (has no administration in time range)    And  lactated ringers bolus 1,000 mL (has no administration in time range)  acetaminophen (TYLENOL) tablet 650 mg (has no administration in time range)  ceFEPIme (MAXIPIME) 2 g  in sodium chloride 0.9 % 100 mL IVPB (0 g Intravenous Stopped 07/17/20 0614)  morphine 4 MG/ML injection 4 mg (4 mg Intravenous Given 07/17/20 0528)  ondansetron (ZOFRAN) injection 4 mg (4 mg Intravenous Given 07/17/20 0528)  HYDROmorphone (DILAUDID) injection 1 mg (1 mg Intravenous Given 07/17/20 0940)     ED Discharge Orders    None      *Please note:  Regina Carpenter was evaluated in Emergency Department on 07/17/2020 for the symptoms described in the history of present illness. She was evaluated in the context of the global COVID-19 pandemic, which necessitated consideration that the patient might be at risk for infection with the SARS-CoV-2 virus that causes COVID-19. Institutional protocols and algorithms that pertain to the evaluation of patients at risk for COVID-19 are in a state of rapid change based on information released by regulatory bodies including the CDC and federal and state organizations. These policies and algorithms were followed during the  patient's care in the ED.  Some ED evaluations and interventions may be delayed as a result of limited staffing during and after the pandemic.*  Note:  This document was prepared using Dragon voice recognition software and may include unintentional dictation errors.   Gregor Hams, MD 07/17/20 [phone]

## 2020-07-17 NOTE — Progress Notes (Signed)
PHARMACY -  BRIEF ANTIBIOTIC NOTE   Pharmacy has received consult(s) for Cefepime and Vancomycin from an ED provider.  The patient's profile has been reviewed for ht/wt/allergies/indication/available labs.    One time order(s) placed for Cefepime 2gm and Vancomycin 1gm  Further antibiotics/pharmacy consults should be ordered by admitting physician if indicated.                       Thank you, Hart Robinsons A 07/17/2020  5:11 AM

## 2020-07-17 NOTE — Progress Notes (Signed)
PHARMACIST - PHYSICIAN COMMUNICATION  CONCERNING:  Enoxaparin (Lovenox) for DVT Prophylaxis    RECOMMENDATION: Patient was prescribed enoxaprin 40mg  q24 hours for VTE prophylaxis.   Filed Weights   07/17/20 0308  Weight: 93.4 kg (206 lb)    Body mass index is 34.28 kg/m.  Estimated Creatinine Clearance: 100.4 mL/min (by C-G formula based on SCr of 0.75 mg/dL).   Based on [place] patient is candidate for enoxaparin 0.5mg /kg TBW SQ every 24 hours based on BMI being >30.  DESCRIPTION: Pharmacy has adjusted enoxaparin dose per [facility] policy.  Patient is now receiving enoxaparin 47.5 mg every 24 hours    Pernell Dupre, PharmD Clinical Pharmacist  07/17/2020 9:28 AM

## 2020-07-17 NOTE — H&P (Signed)
History and Physical    Regina Carpenter MBE:[id number] DOB: 1976-07-25 DOA: 07/17/2020  PCP: Emmaline Kluver., MD   Patient coming from: Home  I have personally briefly reviewed patient's old medical records in [place]  Chief Complaint: Leg pain  HPI: Regina Carpenter is a 44 y.o. female with medical history significant for  rheumatoid arthritis, polyarteritis vasculitis, chronic bilateral foot wounds who presents to the emergency department for evaluation of worsening pain in her left lower extremity. She rates her pain a 10 x 10 in intensity at its worst and is noted to have dry gangrene involving the second and fifth toe on the left foot and open draining ulcers involving her toes on the left foot.  Patient also states that she has had a fever at home which was undocumented. 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 She denies having any chest pain, no shortness of breath, no dizziness, no lightheadedness no nausea, no vomiting, no urinary symptoms or changes in her bowel habits. Labs show sodium 138, potassium 3.6, chloride 101, bicarb 26, BUN 8, creatinine 0.75, calcium 9.1, AST 14, ALT 11, lactic acid 3.1 << 1.7,, white count 20.9, hemoglobin 10.7, hematocrit 36.1, MCV 77.6, RDW 18.5, platelet count 303 Left foot x-ray is negative for osteomyelitis Right foot x-ray shows forefoot amputation without evidence of osteomyelitis.  Soft tissue swelling anterior to the osteotomy sites Twelve-lead EKG reviewed by me shows sinus tachycardia  ED Course: Patient is a 44 year old African-American female with a history of rheumatoid arthritis, polyarteritis vasculitis who is status post recent transmetatarsal amputation of the right forefoot.  She presents to the emergency room for worsening pain in her left foot and has gangrene involving the second and fifth toes on her left foot with draining  ulcers involving the other toes and leg on her left foot.  She had a low-grade fever in the emergency room, remains tachycardic, has marked leukocytosis and an elevated lactic acid level.  She received empiric antibiotic therapy with vancomycin and cefepime and will be admitted to the hospital for further evaluation.  Review of Systems: As per HPI otherwise 10 point review of systems negative.    Past Medical History:  Diagnosis Date  . Arthralgia of right knee   . Arthritis   . Cellulitis of left leg   . Collagen vascular disease ([place])   . Lymphadenopathy, mesenteric   . Right hand pain   . Tobacco abuse     Past Surgical History:  Procedure Laterality Date  . denies    . LOWER EXTREMITY ANGIOGRAPHY Bilateral 05/19/2020   Procedure: Lower Extremity Angiography;  Surgeon: Algernon Huxley, MD;  Location: [place] CV LAB;  Service: Cardiovascular;  Laterality: Bilateral;  . 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 She has been smoking about 0.50 packs per day. She has never used smokeless tobacco. She reports that she does not drink alcohol and does not use drugs.  Allergies  Allergen Reactions  . Ibuprofen Other (See Comments)    Pt states that she sweats profusely after taking ibuprofen Other reaction(s): Other (See Comments) "sweating:"  . Sulfasalazine Rash    Family History  Problem Relation Age of Onset  . Leukemia Maternal Grandfather   . Alcohol abuse  Father      Prior to Admission medications   Medication Sig Start Date End Date Taking? Authorizing Provider  amLODipine (NORVASC) 2.5 MG tablet Take 2.5 mg by mouth daily.   Yes [provider]  cyclophosphamide (CYTOXAN) 50 MG capsule Take 50 mg by mouth daily. Take with food to minimize GI upset. Take early in the day and  maintain hydration.   Yes [provider]  predniSONE (DELTASONE) 20 MG tablet Take 40 mg by mouth daily with breakfast.    Yes [provider]  oxyCODONE-acetaminophen (PERCOCET) 5-325 MG tablet Take 1 tablet by mouth every 6 (six) hours as needed for severe pain. Patient not taking: Reported on 07/17/2020 04/07/20   Johnn Hai, PA-C    Physical Exam: Vitals:   07/17/20 0307 07/17/20 0308 07/17/20 0634 07/17/20 0733  BP: (!) 163/100   (!) 147/101  Pulse: (!) 118  (!) 142 (!) 129  Resp: 18  15 20   Temp: 99.3 F (37.4 C)  (!) 100.7 F (38.2 C) 99.5 F (37.5 C)  TempSrc: Oral  Oral Oral  SpO2: 100%   99%  Weight:  93.4 kg    Height:  5\' 5"  (1.651 m)       Vitals:   07/17/20 0307 07/17/20 0308 07/17/20 0634 07/17/20 0733  BP: (!) 163/100   (!) 147/101  Pulse: (!) 118  (!) 142 (!) 129  Resp: 18  15 20   Temp: 99.3 F (37.4 C)  (!) 100.7 F (38.2 C) 99.5 F (37.5 C)  TempSrc: Oral  Oral Oral  SpO2: 100%   99%  Weight:  93.4 kg    Height:  5\' 5"  (1.651 m)      Constitutional: NAD, alert and oriented x 3.  Acutely ill-appearing Eyes: PERRL, lids and conjunctivae normal ENMT: Mucous membranes are moist.  Neck: normal, supple, no masses, no thyromegaly Respiratory: clear to auscultation bilaterally, no wheezing, no crackles. Normal respiratory effort. No accessory muscle use.  Cardiovascular: Tachycardic, no murmurs / rubs / gallops. No extremity edema. 2+ pedal pulses. No carotid bruits.  Abdomen: no tenderness, no masses palpated. No hepatosplenomegaly. Bowel sounds positive.  Musculoskeletal: no clubbing / cyanosis.  Multiple lower extremity ulcers and dry gangrene involving the second and fifth toe on the left foot with open draining ulcers involving the other toes.  Status post transmetatarsal amputation of the right forefoot Skin: no rashes, lesions, ulcers involving both lower extremities Neurologic: No gross focal neurologic deficit. Psychiatric:  Normal mood and affect.   Labs on Admission: I have personally reviewed following labs and imaging studies  CBC: Recent Labs  Lab 07/17/20 0313  WBC 20.9*  NEUTROABS 14.9*  HGB 10.7*  HCT 36.1  MCV 77.6*  PLT 253   Basic Metabolic Panel: Recent Labs  Lab 07/17/20 0313  NA 138  K 3.6  CL 101  CO2 26  GLUCOSE 88  BUN 8  CREATININE 0.75  CALCIUM 9.1   GFR: Estimated Creatinine Clearance: 100.4 mL/min (by C-G formula based on SCr of 0.75 mg/dL). Liver Function Tests: Recent Labs  Lab 07/17/20 0313  AST 14*  ALT 11  ALKPHOS 67  BILITOT 0.6  PROT 7.8  ALBUMIN 3.7   No results for input(s): LIPASE, AMYLASE in the last 168 hours. No results for input(s): AMMONIA in the last 168 hours. Coagulation Profile: No results for input(s): INR, PROTIME in the last 168 hours. Cardiac Enzymes: No results for input(s): CKTOTAL, CKMB, CKMBINDEX, TROPONINI in the last  168 hours. BNP (last 3 results) No results for input(s): PROBNP in the last 8760 hours. HbA1C: No results for input(s): HGBA1C in the last 72 hours. CBG: No results for input(s): GLUCAP in the last 168 hours. Lipid Profile: No results for input(s): CHOL, HDL, LDLCALC, TRIG, CHOLHDL, LDLDIRECT in the last 72 hours. Thyroid Function Tests: No results for input(s): TSH, T4TOTAL, FREET4, T3FREE, THYROIDAB in the last 72 hours. Anemia Panel: No results for input(s): VITAMINB12, FOLATE, FERRITIN, TIBC, IRON, RETICCTPCT in the last 72 hours. Urine analysis:    Component Value Date/Time   COLORURINE YELLOW (A) 05/19/2020 0845   APPEARANCEUR CLEAR (A) 05/19/2020 0845   LABSPEC 1.010 05/19/2020 0845   PHURINE 7.0 05/19/2020 0845   GLUCOSEU NEGATIVE 05/19/2020 0845   HGBUR MODERATE (A) 05/19/2020 0845   BILIRUBINUR NEGATIVE 05/19/2020 0845   KETONESUR NEGATIVE 05/19/2020 0845   PROTEINUR NEGATIVE 05/19/2020 0845   NITRITE NEGATIVE 05/19/2020 0845   LEUKOCYTESUR NEGATIVE 05/19/2020 0845    Radiological Exams on  Admission: DG Foot Complete Left  Result Date: 07/17/2020 CLINICAL DATA:  Cellulitis, concern for osteomyelitis EXAM: LEFT FOOT - COMPLETE 3+ VIEW COMPARISON:  None. FINDINGS: No fracture or dislocation is seen. The joint spaces are preserved. The visualized soft tissues are unremarkable. No osseous destruction to suggest osteomyelitis. IMPRESSION: No radiographic findings to suggest osteomyelitis. Electronically Signed   By: Julian Hy M.D.   On: 07/17/2020 06:13   DG Foot Complete Right  Result Date: 07/17/2020 CLINICAL DATA:  Cellulitis.  Concern for osteomyelitis. EXAM: RIGHT FOOT COMPLETE - 3+ VIEW COMPARISON:  Right foot radiographs 05/16/2020 FINDINGS: Forefoot amputation at the level of the metatarsals is noted. Soft tissue swelling is present anterior to the osteotomy sites. Residual metatarsals are within normal limits. No osseous erosion is present. No gas is present in the soft tissues. Midfoot and hindfoot are within normal limits. IMPRESSION: 1. Forefoot amputation without radiographic evidence for osteomyelitis. 2. Soft tissue swelling anterior to the osteotomy sites. Electronically Signed   By: San Morelle M.D.   On: 07/17/2020 06:13    EKG: Independently reviewed.  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)   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[facility])   Essential hypertension   Bilateral lower leg cellulitis     Sepsis Present on admission and evidenced by low-grade fever with a T-max of 99, tachycardia, marked leukocytosis with a left shift, bilateral lower extremity ulcers with purulent drainage and elevated lactic acid level Aggressive IV fluid resuscitation Place patient empirically on antibiotic therapy with vancomycin and Rocephin Follow-up results of blood and wound cultures   Ischemic ulcer of toes on left foot Patient with a history of systemic vasculitis  admitted to the hospital for evaluation of ischemic ulcers involving the second and left fifth toe She is status post recent transmetatarsal amputation of the right foot Continue local wound care Pain control as needed We will request podiatry consult   Systemic vasculitis Patient on chronic steroid therapy We will request rheumatology consult during this hospitalization   Nicotine dependence Smoking cessation has been discussed with patient in detail We will place patient on a nicotine transdermal patch 14 mg daily   Hypertension Blood pressure is uncontrolled due to pain Will resume patient's amlodipine 2.5 mg daily and uptitrate meds as needed   DVT prophylaxis: Lovenox Code Status: Full code Family Communication: Greater than 50% of time was spent  discussing plan of care with patient at the bedside.  She verbalizes understanding and agrees with the plan. Disposition Plan: Back to previous home environment Consults called: Podiatry/rheumatology    Collier Bullock MD [facility]     07/17/2020, 9:07 AM

## 2020-07-17 NOTE — Consult Note (Signed)
Pharmacy Antibiotic Note  Regina Carpenter is a 44 y.o. female admitted on 07/17/2020 with cellulitis.  Pharmacy has been consulted for vancomycin dosing. She was recently admitted to [facility] in August of this year and received vancomycin during that admission, although no levels were drawn. On this admission she received a total of 2000 mg IV vancomycin this morning  Plan: start vancomycin 750 mg IV Q 12 hrs  Ke: 0.059 h-1, T1/2: 11.8h  Daily Scr while on vancomycin to monitor renal function  Vancomycin levels as clinically indicated  Height: 5\' 5"  (165.1 cm) Weight: 93.4 kg (206 lb) IBW/kg (Calculated) : 57  Temp (24hrs), Avg:99.8 F (37.7 C), Min:99.3 F (37.4 C), Max:100.7 F (38.2 C)  Recent Labs  Lab 07/17/20 0313 07/17/20 0530 07/17/20 0815  WBC 20.9*  --   --   CREATININE 0.75  --   --   LATICACIDVEN 2.3* 3.1* 1.7    Estimated Creatinine Clearance: 100.4 mL/min (by C-G formula based on SCr of 0.75 mg/dL).    Allergies  Allergen Reactions  . Ibuprofen Other (See Comments)    Pt states that she sweats profusely after taking ibuprofen Other reaction(s): Other (See Comments) "sweating:"  . Sulfasalazine Rash    Antimicrobials this admission: ceftriaxone 10/14 >>  vancomycin 10/14 >>   Microbiology results: 10/14 BCx: pending 10/14 SARS CoV-2: negative  10/14 influenza A/B: negative  Thank you for allowing pharmacy to be a part of this patient's care.  Dallie Piles 07/17/2020 2:57 PM

## 2020-07-17 NOTE — ED Triage Notes (Signed)
Pt to triage via w/c with no distress noted; pt reports bilat leg pain x 2 days ; st swelling to left foot and rt LE; pt reports ulcers to LE(s) and currently taking antibiotic for rt toe amputations performed in August; has appt with podiatry next week

## 2020-07-17 NOTE — ED Provider Notes (Addendum)
Procedures     ----------------------------------------- 1:46 PM on 07/17/2020 -----------------------------------------  Patient seen by Dr. Jefm Bryant of rheumatology, case discussed after his evaluation, consult note reviewed.  Recommends transfer to tertiary care center.  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  He reports that at the time they had agreed with Cytoxan treatment, but now with her progression despite this management, Dr. Jefm Bryant is worried about impending progression and organ injury without more advanced care.    ----------------------------------------- 2:34 PM on 07/17/2020 -----------------------------------------  D/w [facility] medicine who advises they lack avaiable beds. Recommends talking with [facility] nephrology who manages vasculitis cases and may have their own bed availability. Awaiting follow up call from [facility]/nephro.    Carrie Mew, MD 07/17/20 1434  ----------------------------------------- 2:53 PM on 07/17/2020 -----------------------------------------  D/w Dr. Urban Gibson of [facility] nephro. Will accept but there are no available beds.  She will call their ED to inquire about feasibility of ED to ED transfer.  [place] advises system is not on diversion, will recontact [place] to follow up and connect with EDP there.Carrie Mew, MD 07/17/20 [phone]

## 2020-07-18 LAB — CBC
HCT: 33.4 % — ABNORMAL LOW (ref 36.0–46.0)
Hemoglobin: 9.7 g/dL — ABNORMAL LOW (ref 12.0–15.0)
MCH: 22.7 pg — ABNORMAL LOW (ref 26.0–34.0)
MCHC: 29 g/dL — ABNORMAL LOW (ref 30.0–36.0)
MCV: 78 fL — ABNORMAL LOW (ref 80.0–100.0)
Platelets: 285 10*3/uL (ref 150–400)
RBC: 4.28 MIL/uL (ref 3.87–5.11)
RDW: 18.6 % — ABNORMAL HIGH (ref 11.5–15.5)
WBC: 21.3 10*3/uL — ABNORMAL HIGH (ref 4.0–10.5)
nRBC: 0 % (ref 0.0–0.2)

## 2020-07-18 LAB — PROTIME-INR
INR: 1.1 (ref 0.8–1.2)
Prothrombin Time: 13.6 seconds (ref 11.4–15.2)

## 2020-07-18 LAB — CORTISOL-AM, BLOOD: Cortisol - AM: 4.9 ug/dL — ABNORMAL LOW (ref 6.7–22.6)

## 2020-07-18 LAB — CREATININE, SERUM
Creatinine, Ser: 0.72 mg/dL (ref 0.44–1.00)
GFR, Estimated: >60 mL/min (ref >60)

## 2020-07-18 LAB — MRSA PCR SCREENING: MRSA by PCR: NEGATIVE

## 2020-07-18 LAB — C-REACTIVE PROTEIN: CRP: 19.9 mg/dL — ABNORMAL HIGH (ref <1.0)

## 2020-07-18 LAB — PROCALCITONIN: Procalcitonin: 0.11 ng/mL

## 2020-07-18 MED ORDER — ENOXAPARIN SODIUM 60 MG/0.6ML ~~LOC~~ SOLN
0.5000 mg/kg | SUBCUTANEOUS | Status: DC
  Administered 2020-07-19 – 2020-07-25 (×7): 47.5 mg via SUBCUTANEOUS
  Filled 2020-07-18 (×7): qty 0.6

## 2020-07-18 MED ORDER — VANCOMYCIN HCL 1250 MG/250ML IV SOLN
1250.0000 mg | Freq: Two times a day (BID) | INTRAVENOUS | Status: DC
  Administered 2020-07-19: 1250 mg via INTRAVENOUS
  Filled 2020-07-18 (×2): qty 250

## 2020-07-18 MED ORDER — FAMOTIDINE 20 MG PO TABS
20.0000 mg | ORAL_TABLET | Freq: Every day | ORAL | Status: DC
  Administered 2020-07-18 – 2020-07-25 (×8): 20 mg via ORAL
  Filled 2020-07-18 (×8): qty 1

## 2020-07-18 MED ORDER — COLLAGENASE 250 UNIT/GM EX OINT
TOPICAL_OINTMENT | Freq: Every day | CUTANEOUS | Status: DC
  Filled 2020-07-18 (×2): qty 30

## 2020-07-18 MED ORDER — ENOXAPARIN SODIUM 60 MG/0.6ML ~~LOC~~ SOLN
60.0000 mg | SUBCUTANEOUS | Status: DC
  Administered 2020-07-18: 60 mg via SUBCUTANEOUS
  Filled 2020-07-18: qty 0.6

## 2020-07-18 NOTE — Plan of Care (Signed)
  Problem: Education: Goal: Knowledge of General Education information will improve Description Including pain rating scale, medication(s)/side effects and non-pharmacologic comfort measures Outcome: Progressing   Problem: Clinical Measurements: Goal: Ability to maintain clinical measurements within normal limits will improve Outcome: Progressing Goal: Will remain free from infection Outcome: Progressing Goal: Diagnostic test results will improve Outcome: Progressing Goal: Respiratory complications will improve Outcome: Progressing Goal: Cardiovascular complication will be avoided Outcome: Progressing   Problem: Elimination: Goal: Will not experience complications related to bowel motility Outcome: Progressing Goal: Will not experience complications related to urinary retention Outcome: Progressing   Problem: Pain Managment: Goal: General experience of comfort will improve Outcome: Progressing   Problem: Safety: Goal: Ability to remain free from injury will improve Outcome: Progressing   Problem: Skin Integrity: Goal: Risk for impaired skin integrity will decrease Outcome: Progressing   

## 2020-07-18 NOTE — Consult Note (Addendum)
Pharmacy Antibiotic Note  Regina Carpenter is a 44 y.o. 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  Pharmacy has been consulted for vancomycin dosing. Immunosuppression has been continued and steroid dosing escalated given suspected progression of connective tissue disease.   Today, 07/18/20  --WBC trending up slightly but may be reactive from steroids. PCT 0.11 --CRP trending up significantly --Scr largely stable  Plan:   Increase vancomycin to 1250 mg IV Q 12 hrs  Daily Scr while on vancomycin to monitor renal function  Vancomycin levels as clinically indicated (goal trough 10-15 mcg/mL for cellulitis)  Height: 5\' 5"  (165.1 cm) Weight: 93.4 kg (206 lb) IBW/kg (Calculated) : 57  Temp (24hrs), Avg:99.3 F (37.4 C), Min:98.4 F (36.9 C), Max:100.4 F (38 C)  Recent Labs  Lab 07/17/20 0313 07/17/20 0530 07/17/20 0815 07/17/20 1450 07/18/20 0638  WBC 20.9*  --   --   --  21.3*  CREATININE 0.75  --   --  0.81 0.72  LATICACIDVEN 2.3* 3.1* 1.7  --   --     Estimated Creatinine Clearance: 100.4 mL/min (by C-G formula based on SCr of 0.72 mg/dL).    Antimicrobials this admission: ceftriaxone 10/14 >>  vancomycin 10/14 >>   Microbiology results: 10/14 BCx: NGTD 10/14 SARS CoV-2: negative  10/14 influenza A/B: negative  Thank you for allowing pharmacy to be a part of this patient's care.  Regina Carpenter 07/18/2020 7:54 AM

## 2020-07-18 NOTE — Consult Note (Addendum)
[facility] Nurse Consult Note: Reason for Consult: Consult requested for BLE.  Performed remotely after review of progress notes and photos in the EMR.  Pt has a recent right BKA surgical site; this is red and a small area has not healed completely from the incision line, there is a shallow full thickness wound.  There are multiple full thickness wounds related to vasculitis to bilat lower legs; all are 100% yellow and moist.  Left foot with yellow necrotic 2nd and 5th toes Dressing procedure/placement/frequency: Rheumatology has recommended transfer to a facility where specialists familiar with topical treatment of vasculitis are located for this complex medical condition, but they do not have beds available at this time.  Topical treatment orders provided for bedside nurses to preform as follows to assist with enzymatic debridement: Apply Santyl to bilat leg wound wounds Q day, then cover with moist gauze and foam dressings.  Change foam dressings Q 3 days or PRN soiling.  Apply xeroform gauze to right stump wound and left toe wounds Q day and cover with kerlex.  Podiatry consult has been requested and is pending; please refer to their team for further recommendations regarding plan of care . Please re-consult if further assistance is needed.  Thank-you,  Julien Girt MSN, [place], [place], [place], [place]

## 2020-07-18 NOTE — Consult Note (Signed)
ORTHOPAEDIC CONSULTATION  REQUESTING PHYSICIAN: Nolberto Hanlon, MD  Chief Complaint: Painful ulcers to lower extremities and gangrene left fifth toe.  HPI: Regina Carpenter is a 44 y.o. female who complains of worsening pain to her lower extremities.  She has a longstanding history of rheumatoid arthritis with vasculitis.  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  This is stable.  This is been followed in the outpatient clinic.  Presents to the ER with worsening pain to bilateral lower extremities with worsening ulcerations to the lower extremities.  She is also having discoloration to the legs.  Has previously been seen at the vasculitis clinic at [facility] but has not followed up.  Asked to evaluate for lower extremity ulcerations.  Past Medical History:  Diagnosis Date  . Arthralgia of right knee   . Arthritis   . Cellulitis of left leg   . Collagen vascular disease ([place])   . Lymphadenopathy, mesenteric   . Right hand pain   . Tobacco abuse    Past Surgical History:  Procedure Laterality Date  . denies    . LOWER EXTREMITY ANGIOGRAPHY Bilateral 05/19/2020   Procedure: Lower Extremity Angiography;  Surgeon: Algernon Huxley, MD;  Location: [facility];  Service: Cardiovascular;  Laterality: Bilateral;  . 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   Procedure: TRANSMETATARSAL AMPUTATION;  Surgeon: Sharlotte Alamo, DPM;  Location: [facility] OR[facility];  Service: Podiatry;  Laterality: Right;   Social History   Socioeconomic History  . Marital status: Single    Spouse name: Not on file  . Number of children: Not on file  . Years of education: Not on file  . Highest education level: Not on file  Occupational History  . Not on file  Tobacco Use  . Smoking status: Current Every Day Smoker    Packs/day: 0.50    Types: Cigarettes  . Smokeless tobacco: Never Used  Vaping Use  . Vaping Use: Never used  Substance and Sexual Activity  . Alcohol use: No  . Drug use: No   . Sexual activity: Not on file  Other Topics Concern  . Not on file  Social History Narrative   Lives at home with father   Social Determinants of Health   Financial Resource Strain:   . Difficulty of Paying Living Expenses: Not on file  Food Insecurity:   . Worried About Charity fundraiser in the Last Year: Not on file  . Ran Out of Food in the Last Year: Not on file  Transportation Needs:   . Lack of Transportation (Medical): Not on file  . Lack of Transportation (Non-Medical): Not on file  Physical Activity:   . Days of Exercise per Week: Not on file  . Minutes of Exercise per Session: Not on file  Stress:   . Feeling of Stress : Not on file  Social Connections:   . Frequency of Communication with Friends and Family: Not on file  . Frequency of Social Gatherings with Friends and Family: Not on file  . Attends Religious Services: Not on file  . Active Member of Clubs or Organizations: Not on file  . Attends Archivist Meetings: Not on file  . Marital Status: Not on file   Family History  Problem Relation Age of Onset  . Leukemia Maternal Grandfather   . Alcohol abuse Father    Allergies  Allergen Reactions  . Ibuprofen Other (See Comments)    Pt states that she sweats profusely after taking ibuprofen  Other reaction(s): Other (See Comments) "sweating:"  . Sulfasalazine Rash   Prior to Admission medications   Medication Sig Start Date End Date Taking? Authorizing Provider  amLODipine (NORVASC) 2.5 MG tablet Take 2.5 mg by mouth daily.   Yes [provider]  cyclophosphamide (CYTOXAN) 50 MG capsule Take 50 mg by mouth daily. Take with food to minimize GI upset. Take early in the day and maintain hydration.   Yes [provider]  predniSONE (DELTASONE) 20 MG tablet Take 40 mg by mouth daily with breakfast.    Yes [provider]  oxyCODONE-acetaminophen (PERCOCET) 5-325 MG tablet Take 1 tablet by mouth every 6 (six) hours as needed  for severe pain. Patient not taking: Reported on 07/17/2020 04/07/20   Johnn Hai, PA-C   DG Chest Port 1 View  Result Date: 07/17/2020 CLINICAL DATA:  Fever, bilateral leg pain for 2 days EXAM: PORTABLE CHEST 1 VIEW COMPARISON:  Radiograph 05/20/2020 FINDINGS: Streaky opacities in the lung bases with low lung volumes. Normal pulmonary vascularity. No focal consolidative opacity is seen. No pneumothorax or visible effusion. The cardiomediastinal contours are unremarkable. No acute osseous or soft tissue abnormality. Telemetry leads overlie the chest. IMPRESSION: Low volumes with streaky basilar opacities, favored to reflect atelectasis versus early infection. Electronically Signed   By: Lovena Le M.D.   On: 07/17/2020 16:38   DG Foot Complete Left  Result Date: 07/17/2020 CLINICAL DATA:  Cellulitis, concern for osteomyelitis EXAM: LEFT FOOT - COMPLETE 3+ VIEW COMPARISON:  None. FINDINGS: No fracture or dislocation is seen. The joint spaces are preserved. The visualized soft tissues are unremarkable. No osseous destruction to suggest osteomyelitis. IMPRESSION: No radiographic findings to suggest osteomyelitis. Electronically Signed   By: Julian Hy M.D.   On: 07/17/2020 06:13   DG Foot Complete Right  Result Date: 07/17/2020 CLINICAL DATA:  Cellulitis.  Concern for osteomyelitis. EXAM: RIGHT FOOT COMPLETE - 3+ VIEW COMPARISON:  Right foot radiographs 05/16/2020 FINDINGS: Forefoot amputation at the level of the metatarsals is noted. Soft tissue swelling is present anterior to the osteotomy sites. Residual metatarsals are within normal limits. No osseous erosion is present. No gas is present in the soft tissues. Midfoot and hindfoot are within normal limits. IMPRESSION: 1. Forefoot amputation without radiographic evidence for osteomyelitis. 2. Soft tissue swelling anterior to the osteotomy sites. Electronically Signed   By: San Morelle M.D.   On: 07/17/2020 06:13    Positive  ROS: All other systems have been reviewed and were otherwise negative with the exception of those mentioned in the HPI and as above.  12 point ROS was performed.  Physical Exam: General: Alert and oriented.  No apparent distress.  Vascular:  Left foot:Dorsalis Pedis:  absent Posterior Tibial:  absent  Right foot: Dorsalis Pedis:  absent Posterior Tibial:  absent  Neuro:intact sensation  Derm: Transmetatarsal amputation site on the right side is stable.  No dehiscence and this has healed nicely.  She still has a residual gangrenous left fifth toe.  This is not symptomatic with palpation and is dry at this time.  Also area of superficial necrosis to the second toe at this time.  Multiple exquisitely painful ulcerations to bilateral lower extremities.  These are most consistent with her vasculitis and likely ulcerations from vasculopathy and vasculitis.   .        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  Assessment: Gangrene left fifth toe with superficial necrosis left  second toe. Multiple bilateral lower extremity vasculitis ulcerations. Chronic pain   Plan: At this point defer wound care to the lower legs to wound care nurse.  I have read their recommendations and agree for now.  No need for amputation as gangrenous change on the left fifth toe is stable and right foot is stable.  This has not progressed.  Patient is in extreme pain at this time to the lower extremities.  Agree with consideration for transfer to tertiary care center for assistance with vasculitis and worsening ulcerations and ischemic lesions.    Elesa Hacker, DPM Cell [phone]   07/18/2020 3:07 PM

## 2020-07-18 NOTE — Progress Notes (Signed)
PROGRESS NOTE    Regina Carpenter  PIR:[id number] DOB: January 26, 1976 DOA: 07/17/2020 PCP: Emmaline Kluver., MD    Brief Narrative:  Regina Carpenter is a 44 y.o. female with medical history significant for rheumatoid arthritis, polyarteritis vasculitis, chronic bilateral foot wounds who presents to the emergency department for evaluation of worsening pain in her left lower extremity.    Consultants:   Rheumatology wound care  Procedures:   Antimicrobials:   Rocephin and vancomycin    Subjective: C/o leg pain at ulcer sites. No other issues.   Objective: Vitals:   07/17/20 2100 07/17/20 2220 07/17/20 2354 07/18/20 0428  BP:   125/83 119/80  Pulse:  (!) 115 (!) 110 97  Resp: 18 17 14 18   Temp: 99.4 F (37.4 C)  99 F (37.2 C) 98.4 F (36.9 C)  TempSrc: Oral  Oral Oral  SpO2:  95% 99% 100%  Weight:      Height:        Intake/Output Summary (Last 24 hours) at 07/18/2020 0748 Last data filed at 07/17/2020 1925 Gross per 24 hour  Intake 8163.49 ml  Output --  Net 8163.49 ml   Filed Weights   07/17/20 0308  Weight: 93.4 kg    Examination:  General exam: Appears calm and comfortable  Respiratory system: Clear to auscultation. Respiratory effort normal. Cardiovascular system: S1 & S2 heard, RRR. No JVD, murmurs, rubs, gallops or clicks.  Gastrointestinal system: Abdomen is nondistended, soft and nontender. Normal bowel sounds heard. Central nervous system: Alert and oriented. No focal neurological deficits. Extremities: mild b/l edema, multiple open wounds of LE, L>R Skin: warm, dry Psychiatry: Judgement and insight appear normal. Mood & affect appropriate.     Data Reviewed: I have personally reviewed following labs and imaging studies  CBC: Recent Labs  Lab 07/17/20 0313 07/18/20 0638  WBC 20.9* 21.3*  NEUTROABS 14.9*  --   HGB 10.7* 9.7*  HCT 36.1 33.4*  MCV 77.6* 78.0*  PLT 303 630   Basic Metabolic Panel: Recent Labs  Lab  07/17/20 0313 07/17/20 1450 07/18/20 0638  NA 138 137  --   K 3.6 3.7  --   CL 101 102  --   CO2 26 24  --   GLUCOSE 88 90  --   BUN 8 6  --   CREATININE 0.75 0.81 0.72  CALCIUM 9.1 8.8*  --    GFR: Estimated Creatinine Clearance: 100.4 mL/min (by C-G formula based on SCr of 0.72 mg/dL). Liver Function Tests: Recent Labs  Lab 07/17/20 0313  AST 14*  ALT 11  ALKPHOS 67  BILITOT 0.6  PROT 7.8  ALBUMIN 3.7   No results for input(s): LIPASE, AMYLASE in the last 168 hours. No results for input(s): AMMONIA in the last 168 hours. Coagulation Profile: Recent Labs  Lab 07/18/20 0638  INR 1.1   Cardiac Enzymes: No results for input(s): CKTOTAL, CKMB, CKMBINDEX, TROPONINI in the last 168 hours. BNP (last 3 results) No results for input(s): PROBNP in the last 8760 hours. HbA1C: No results for input(s): HGBA1C in the last 72 hours. CBG: No results for input(s): GLUCAP in the last 168 hours. Lipid Profile: No results for input(s): CHOL, HDL, LDLCALC, TRIG, CHOLHDL, LDLDIRECT in the last 72 hours. Thyroid Function Tests: No results for input(s): TSH, T4TOTAL, FREET4, T3FREE, THYROIDAB in the last 72 hours. Anemia Panel: No results for input(s): VITAMINB12, FOLATE, FERRITIN, TIBC, IRON, RETICCTPCT in the last 72 hours. Sepsis Labs: Recent Labs  Lab 07/17/20  2671 07/17/20 0530 07/17/20 0815  LATICACIDVEN 2.3* 3.1* 1.7    Recent Results (from the past 240 hour(s))  Blood Culture (routine x 2)     Status: None (Preliminary result)   Collection Time: 07/17/20  5:29 AM   Specimen: BLOOD  Result Value Ref Range Status   Specimen Description BLOOD LEFT [facility]  Final   Special Requests   Final    BOTTLES DRAWN AEROBIC AND ANAEROBIC Blood Culture adequate volume   Culture   Final    NO GROWTH 1 DAY Performed at [facility], [address], [place], [place] [address]    Report Status PENDING  Incomplete  Blood Culture (routine x 2)     Status: None (Preliminary  result)   Collection Time: 07/17/20  5:30 AM   Specimen: BLOOD  Result Value Ref Range Status   Specimen Description BLOOD RIGHT [facility]  Final   Special Requests   Final    BOTTLES DRAWN AEROBIC AND ANAEROBIC Blood Culture adequate volume   Culture   Final    NO GROWTH 1 DAY Performed at [facility], [address], [place], [place] [address]    Report Status PENDING  Incomplete  Respiratory Panel by RT PCR (Flu A&B, Covid) - Nasopharyngeal Swab     Status: None   Collection Time: 07/17/20  8:30 AM   Specimen: Nasopharyngeal Swab  Result Value Ref Range Status   SARS Coronavirus 2 by RT PCR NEGATIVE NEGATIVE Final    Comment: (NOTE) SARS-CoV-2 target nucleic acids are NOT DETECTED.  The SARS-CoV-2 RNA is generally detectable in upper respiratoy specimens during the acute phase of infection. The lowest concentration of SARS-CoV-2 viral copies this assay can detect is 131 copies/mL. A negative result does not preclude SARS-Cov-2 infection and should not be used as the sole basis for treatment or other patient management decisions. A negative result may occur with  improper specimen collection/handling, submission of specimen other than nasopharyngeal swab, presence of viral mutation(s) within the areas targeted by this assay, and inadequate number of viral copies (<131 copies/mL). A negative result must be combined with clinical observations, patient history, and epidemiological information. The expected result is Negative.  Fact Sheet for Patients:  PinkCheek.be  Fact Sheet for Healthcare Providers:  GravelBags.it  This test is no t yet approved or cleared by the [place] FDA and  has been authorized for detection and/or diagnosis of SARS-CoV-2 by FDA under an Emergency Use Authorization (EUA). This EUA will remain  in effect (meaning this test can be used) for the duration of the COVID-19 declaration under  Section 564(b)(1) of the Act, 21 U.S.C. section 360bbb-3(b)(1), unless the authorization is terminated or revoked sooner.     Influenza A by PCR NEGATIVE NEGATIVE Final   Influenza B by PCR NEGATIVE NEGATIVE Final    Comment: (NOTE) The Xpert Xpress SARS-CoV-2/FLU/RSV assay is intended as an aid in  the diagnosis of influenza from Nasopharyngeal swab specimens and  should not be used as a sole basis for treatment. Nasal washings and  aspirates are unacceptable for Xpert Xpress SARS-CoV-2/FLU/RSV  testing.  Fact Sheet for Patients: PinkCheek.be  Fact Sheet for Healthcare Providers: GravelBags.it  This test is not yet approved or cleared by the [place] FDA and  has been authorized for detection and/or diagnosis of SARS-CoV-2 by  FDA under an Emergency Use Authorization (EUA). This EUA will remain  in effect (meaning this test can be used) for the duration of the  Covid-19 declaration under Section 564(b)(1) of the Act, 21  U.S.C. section 360bbb-3(b)(1), unless the authorization is  terminated or revoked. Performed at [facility], [address] [place], [place] [address]   MRSA PCR Screening     Status: None   Collection Time: 07/18/20 12:30 AM   Specimen: Nasopharyngeal  Result Value Ref Range Status   MRSA by PCR NEGATIVE NEGATIVE Final    Comment:        The GeneXpert MRSA Assay (FDA approved for NASAL specimens only), is one component of a comprehensive MRSA colonization surveillance program. It is not intended to diagnose MRSA infection nor to guide or monitor treatment for MRSA infections. Performed at [facility], [address] [place],  [address]          Radiology Studies: DG Chest [place] 1 View  Result Date: 07/17/2020 CLINICAL DATA:  Fever, bilateral leg pain for 2 days EXAM: PORTABLE CHEST 1 VIEW COMPARISON:  Radiograph 05/20/2020 FINDINGS: Streaky opacities  in the lung bases with low lung volumes. Normal pulmonary vascularity. No focal consolidative opacity is seen. No pneumothorax or visible effusion. The cardiomediastinal contours are unremarkable. No acute osseous or soft tissue abnormality. Telemetry leads overlie the chest. IMPRESSION: Low volumes with streaky basilar opacities, favored to reflect atelectasis versus early infection. Electronically Signed   By: Lovena Le M.D.   On: 07/17/2020 16:38   DG Foot Complete Left  Result Date: 07/17/2020 CLINICAL DATA:  Cellulitis, concern for osteomyelitis EXAM: LEFT FOOT - COMPLETE 3+ VIEW COMPARISON:  None. FINDINGS: No fracture or dislocation is seen. The joint spaces are preserved. The visualized soft tissues are unremarkable. No osseous destruction to suggest osteomyelitis. IMPRESSION: No radiographic findings to suggest osteomyelitis. Electronically Signed   By: Julian Hy M.D.   On: 07/17/2020 06:13   DG Foot Complete Right  Result Date: 07/17/2020 CLINICAL DATA:  Cellulitis.  Concern for osteomyelitis. EXAM: RIGHT FOOT COMPLETE - 3+ VIEW COMPARISON:  Right foot radiographs 05/16/2020 FINDINGS: Forefoot amputation at the level of the metatarsals is noted. Soft tissue swelling is present anterior to the osteotomy sites. Residual metatarsals are within normal limits. No osseous erosion is present. No gas is present in the soft tissues. Midfoot and hindfoot are within normal limits. IMPRESSION: 1. Forefoot amputation without radiographic evidence for osteomyelitis. 2. Soft tissue swelling anterior to the osteotomy sites. Electronically Signed   By: San Morelle M.D.   On: 07/17/2020 06:13        Scheduled Meds: . amLODipine  2.5 mg Oral Daily  . cyclophosphamide  50 mg Oral Daily  . enoxaparin (LOVENOX) injection  60 mg Subcutaneous Q24H  . methylPREDNISolone (SOLU-MEDROL) injection  40 mg Intravenous Q12H  . nicotine  14 mg Transdermal Daily   Continuous Infusions: . sodium  chloride 125 mL/hr at 07/17/20 1247  . cefTRIAXone (ROCEPHIN)  IV Stopped (07/17/20 1925)  . 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)   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HCC)   Essential hypertension   Bilateral lower leg cellulitis  Sepsis Present on admission and evidenced by low-grade fever with a T-max of 99, tachycardia, marked leukocytosis with a left shift, bilateral lower extremity ulcers with purulent drainage and elevated lactic acid level present on admission Continue with IV antibiotics vancomycin and Rocephin Follow-up blood cultures and wound cultures Continue IV fluids  Ischemic ulcer of toes on left foot Patient with a history of systemic vasculitis admitted to the hospital for evaluation of ischemic ulcers involving the second and left fifth toe She is 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  Status post methotrexate and prednisone Dermatology recommended transferring to [facility] however there is no beds available and was declined. Continue IV steroids as she is on chronic steroid therapy  We will follow up with rheumatology for any further rec. Will consult ID about abx management and recommendations   Nicotine dependence Counseled on admission about smoking cessation Continue nicotine patch  Hypertension Controlled, continue with amlodipine     DVT prophylaxis: Lovenox Code Status: Full Family Communication: None at bedside  Status  is: Inpatient  Remains inpatient appropriate because:IV treatments appropriate due to intensity of illness or inability to take PO   Dispo: The patient is from: Home              Anticipated d/c is to: Home              Anticipated d/c date is: 3 days              Patient currently is not medically stable to d/c.            LOS: 1 day   Time spent: 35 minutes with >50% on coc    Nolberto Hanlon, MD [facility] Pager [phone]  If 7PM-7AM, please contact night-coverage [email or web address] Password [email or web address] 07/18/2020, 7:48 AM

## 2020-07-18 NOTE — Consult Note (Signed)
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 female 44 y.o. female with medical history significant for rheumatoid arthritis, polyarteritis vasculitis, chronic bilateral foot wounds admitted with worsening pain in her left lower extremity. On admit was  noted to have dry gangrene involving the second and fifth toe on the left foot and open draining ulcers involving her toes on the left foot.  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 She follows with rheumatology and has been seen at [facility] vasculitis clinic.  On admit T 100.7 and wbc 20. Started on ceftriaxone and vanco Currently denies fevers or chills. No drainage from wounds but quite painful.   Past Medical History:  Diagnosis Date  . Arthralgia of right knee   . Arthritis   . Cellulitis of left leg   . Collagen vascular disease ([place])   . Lymphadenopathy, mesenteric   . Right hand pain   . Tobacco abuse    Past Surgical History:  Procedure Laterality Date  . denies    . LOWER EXTREMITY ANGIOGRAPHY Bilateral 05/19/2020   Procedure: Lower Extremity Angiography;  Surgeon: Algernon Huxley, MD;  Location: [place] [facility];  Service: Cardiovascular;  Laterality: Bilateral;  . 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   Procedure: TRANSMETATARSAL AMPUTATION;  Surgeon: Sharlotte Alamo, DPM;  Location: [facility] OR[facility];  Service: Podiatry;  Laterality: Right;   Social History   Tobacco Use  . Smoking status: Current Every Day Smoker    Packs/day: 0.50    Types: Cigarettes  . Smokeless tobacco: Never Used  Vaping Use  . Vaping  Use: Never used  Substance Use Topics  . Alcohol use: No  . Drug use: No   Family History  Problem Relation Age of Onset  . Leukemia Maternal Grandfather   . Alcohol abuse Father     Allergies:  Allergies  Allergen Reactions  . Ibuprofen Other (See Comments)    Pt states that she sweats profusely after taking ibuprofen Other reaction(s): Other (See Comments) "sweating:"  . Sulfasalazine Rash    Current antibiotics: Antibiotics Given (last 72 hours)    Date/Time Action Medication Dose Rate   07/17/20 0544 New Bag/Given   ceFEPIme (MAXIPIME) 2 g in sodium chloride 0.9 % 100 mL IVPB 2 g 200 mL/hr   07/17/20 0610 New Bag/Given   metroNIDAZOLE (FLAGYL) IVPB 500 mg 500 mg 100 mL/hr   07/17/20 5284 New Bag/Given  [other abx infusing]   vancomycin (VANCOCIN) IVPB 1000 mg/200 mL premix 1,000 mg 200 mL/hr   07/17/20 0945 New Bag/Given   vancomycin (VANCOCIN) IVPB 1000 mg/200 mL premix 1,000 mg 200 mL/hr   07/17/20 1831 New Bag/Given  [medication not avaliable]   cefTRIAXone (ROCEPHIN) 1 g in sodium chloride 0.9 % 100 mL IVPB 1 g 200 mL/hr   07/18/20 0146 New Bag/Given   vancomycin (VANCOREADY) IVPB 750 mg/150 mL 750 mg 150 mL/hr   07/18/20 1542 New Bag/Given   vancomycin (VANCOREADY) IVPB 750 mg/150 mL 750 mg 150 mL/hr      MEDICATIONS: . amLODipine  2.5 mg Oral  Daily  . collagenase   Topical Daily  . cyclophosphamide  50 mg Oral Daily  . [START ON 07/19/2020] enoxaparin (LOVENOX) injection  0.5 mg/kg Subcutaneous Q24H  . methylPREDNISolone (SOLU-MEDROL) injection  40 mg Intravenous Q12H  . nicotine  14 mg Transdermal Daily    Review of Systems - 11 systems reviewed and negative per HPI   OBJECTIVE: Temp:  [98.4 F (36.9 C)-99.4 F (37.4 C)] 98.7 F (37.1 C) (10/15 1521) Pulse Rate:  [97-115] 109 (10/15 1521) Resp:  [14-18] 17 (10/15 1521) BP: (108-125)/(72-89) 108/89 (10/15 1521) SpO2:  [95 %-100 %] 100 % (10/15 1521)  Physical Exam  Constitutional:NAD HENT:  anicteric  Mouth/Throat: Oropharynx is clear and moist. No oropharyngeal exudate.  Cardiovascular: Normal rate, regular rhythm and normal heart sounds. Pulmonary/Chest: Effort normal and breath sounds normal. No respiratory distress. He has no wheezes.  Abdominal: Soft. Bowel sounds are normal. He exhibits no distension. There is no tenderness.  Lymphadenopathy: He has no cervical adenopathy.  Neurological: He is alert and oriented to person, place, and time.  Skin: see photos below   Psychiatric: He has a normal mood and affect. His behavior is normal.       LABS: Results for orders placed or performed during the hospital encounter of 07/17/20 (from the past 48 hour(s))  CBC with Differential     Status: Abnormal   Collection Time: 07/17/20  3:13 AM  Result Value Ref Range   WBC 20.9 (H) 4.0 - 10.5 K/uL   RBC 4.65 3.87 - 5.11 MIL/uL   Hemoglobin 10.7 (L) 12.0 - 15.0 g/dL   HCT 36.1 36 - 46 %   MCV 77.6 (L) 80.0 - 100.0 fL   MCH 23.0 (L) 26.0 - 34.0 pg   MCHC 29.6 (L) 30.0 - 36.0 g/dL   RDW 18.5 (H) 11.5 - 15.5 %   Platelets 303 150 - 400 K/uL   nRBC 0.0 0.0 - 0.2 %   Neutrophils Relative % 71 %   Neutro Abs 14.9 (H) 1.7 - 7.7 K/uL   Lymphocytes Relative 20 %   Lymphs Abs 4.2 (H) 0.7 - 4.0 K/uL   Monocytes Relative 7 %   Monocytes Absolute 1.4 (H) 0.1 - 1.0 K/uL   Eosinophils Relative 1 %   Eosinophils Absolute 0.1 0.0 - 0.5 K/uL   Basophils Relative 0 %   Basophils Absolute 0.1 0.0 - 0.1 K/uL   WBC Morphology MORPHOLOGY UNREMARKABLE    RBC Morphology MORPHOLOGY UNREMARKABLE    Smear Review Normal platelet morphology    Immature Granulocytes 1 %   Abs Immature Granulocytes 0.21 (H) 0.00 - 0.07 K/uL    Comment: Performed at [facility], [address] [place], [place] [address]  Comprehensive metabolic panel     Status: Abnormal   Collection Time: 07/17/20  3:13 AM  Result Value Ref Range   Sodium 138 135 - 145 mmol/L   Potassium 3.6 3.5 - 5.1 mmol/L    Chloride 101 98 - 111 mmol/L   CO2 26 22 - 32 mmol/L   Glucose, Bld 88 70 - 99 mg/dL    Comment: Glucose reference range applies only to samples taken after fasting for at least 8 hours.   BUN 8 6 - 20 mg/dL   Creatinine, Ser 0.75 0.44 - 1.00 mg/dL   Calcium 9.1 8.9 - 10.3 mg/dL   Total Protein 7.8 6.5 - 8.1 g/dL   Albumin 3.7 3.5 - 5.0 g/dL   AST 14 (L) 15 -  41 U/L   ALT 11 0 - 44 U/L   Alkaline Phosphatase 67 38 - 126 U/L   Total Bilirubin 0.6 0.3 - 1.2 mg/dL   GFR, Estimated >60 >60 mL/min   Anion gap 11 5 - 15    Comment: Performed at [facility], [address] [place], [place] [address]  Lactic acid, plasma     Status: Abnormal   Collection Time: 07/17/20  3:13 AM  Result Value Ref Range   Lactic Acid, Venous 2.3 (HH) 0.5 - 1.9 mmol/L    Comment: CRITICAL RESULT CALLED TO, READ BACK BY AND VERIFIED WITH LISA THOMPSON _0  ON 07/17/20 SKL Performed at [facility], [address] [place], [place] [address]   C-reactive protein     Status: Abnormal   Collection Time: 07/17/20  5:13 AM  Result Value Ref Range   CRP 3.1 (H) <1.0 mg/dL    Comment: Performed at [facility] [address], [place], [place] [address]  Blood Culture (routine x 2)     Status: None (Preliminary result)   Collection Time: 07/17/20  5:29 AM   Specimen: BLOOD  Result Value Ref Range   Specimen Description BLOOD LEFT AC    Special Requests      BOTTLES DRAWN AEROBIC AND ANAEROBIC Blood Culture adequate volume   Culture      NO GROWTH 1 DAY Performed at [facility], [address], [place], [place] [address]    Report Status PENDING   Lactic acid, plasma     Status: Abnormal   Collection Time: 07/17/20  5:30 AM  Result Value Ref Range   Lactic Acid, Venous 3.1 (HH) 0.5 - 1.9 mmol/L    Comment: CRITICAL VALUE NOTED. VALUE IS CONSISTENT WITH PREVIOUSLY REPORTED/CALLED VALUE RH Performed at [facility], [address] [place], [place]  [address]   Blood Culture (routine x 2)     Status: None (Preliminary result)   Collection Time: 07/17/20  5:30 AM   Specimen: BLOOD  Result Value Ref Range   Specimen Description BLOOD RIGHT AC    Special Requests      BOTTLES DRAWN AEROBIC AND ANAEROBIC Blood Culture adequate volume   Culture      NO GROWTH 1 DAY Performed at [facility], [address] [place], [place] [address]    Report Status PENDING   Lactic acid, plasma     Status: None   Collection Time: 07/17/20  8:15 AM  Result Value Ref Range   Lactic Acid, Venous 1.7 0.5 - 1.9 mmol/L    Comment: Performed at [facility], [address] [place], [place] [address]  Respiratory Panel by RT PCR (Flu A&B, Covid) - Nasopharyngeal Swab     Status: None   Collection Time: 07/17/20  8:30 AM   Specimen: Nasopharyngeal Swab  Result Value Ref Range   SARS Coronavirus 2 by RT PCR NEGATIVE NEGATIVE    Comment: (NOTE) SARS-CoV-2 target nucleic acids are NOT DETECTED.  The SARS-CoV-2 RNA is generally detectable in upper respiratoy specimens during the acute phase of infection. The lowest concentration of SARS-CoV-2 viral copies this assay can detect is 131 copies/mL. A negative result does not preclude SARS-Cov-2 infection and should not be used as the sole basis for treatment or other patient management decisions. A negative result may occur with  improper specimen collection/handling, submission of specimen other than nasopharyngeal swab, presence of viral mutation(s) within the areas targeted by this  assay, and inadequate number of viral copies (<131 copies/mL). A negative result must be combined with clinical observations, patient history, and epidemiological information. The expected result is Negative.  Fact Sheet for Patients:  PinkCheek.be  Fact Sheet for Healthcare Providers:  GravelBags.it  This test is no t yet approved or cleared by the  [place] FDA and  has been authorized for detection and/or diagnosis of SARS-CoV-2 by FDA under an Emergency Use Authorization (EUA). This EUA will remain  in effect (meaning this test can be used) for the duration of the COVID-19 declaration under Section 564(b)(1) of the Act, 21 U.S.C. section 360bbb-3(b)(1), unless the authorization is terminated or revoked sooner.     Influenza A by PCR NEGATIVE NEGATIVE   Influenza B by PCR NEGATIVE NEGATIVE    Comment: (NOTE) The Xpert Xpress SARS-CoV-2/FLU/RSV assay is intended as an aid in  the diagnosis of influenza from Nasopharyngeal swab specimens and  should not be used as a sole basis for treatment. Nasal washings and  aspirates are unacceptable for Xpert Xpress SARS-CoV-2/FLU/RSV  testing.  Fact Sheet for Patients: PinkCheek.be  Fact Sheet for Healthcare Providers: GravelBags.it  This test is not yet approved or cleared by the [place] FDA and  has been authorized for detection and/or diagnosis of SARS-CoV-2 by  FDA under an Emergency Use Authorization (EUA). This EUA will remain  in effect (meaning this test can be used) for the duration of the  Covid-19 declaration under Section 564(b)(1) of the Act, 21  U.S.C. section 360bbb-3(b)(1), unless the authorization is  terminated or revoked. Performed at [facility], [address] [place], [place] [address]   Sedimentation rate     Status: Abnormal   Collection Time: 07/17/20  2:50 PM  Result Value Ref Range   Sed Rate 27 (H) 0 - 20 mm/hr    Comment: Performed at [facility], [address] [place], [place] [address]  Basic metabolic panel     Status: Abnormal   Collection Time: 07/17/20  2:50 PM  Result Value Ref Range   Sodium 137 135 - 145 mmol/L   Potassium 3.7 3.5 - 5.1 mmol/L   Chloride 102 98 - 111 mmol/L   CO2 24 22 - 32 mmol/L   Glucose, Bld 90 70 - 99 mg/dL    Comment:  Glucose reference range applies only to samples taken after fasting for at least 8 hours.   BUN 6 6 - 20 mg/dL   Creatinine, Ser 0.81 0.44 - 1.00 mg/dL   Calcium 8.8 (L) 8.9 - 10.3 mg/dL   GFR, Estimated >60 >60 mL/min   Anion gap 11 5 - 15    Comment: Performed at [facility], [address] [place], [place] [address]  MRSA PCR Screening     Status: None   Collection Time: 07/18/20 12:30 AM   Specimen: Nasopharyngeal  Result Value Ref Range   MRSA by PCR NEGATIVE NEGATIVE    Comment:        The GeneXpert MRSA Assay (FDA approved for NASAL specimens only), is one component of a comprehensive MRSA colonization surveillance program. It is not intended to diagnose MRSA infection nor to guide or monitor treatment for MRSA infections. Performed at [facility], [address] [place], [place] [address]   Protime-INR     Status: None   Collection Time: 07/18/20  6:38 AM  Result Value Ref Range   Prothrombin Time 13.6 11.4 - 15.2 seconds   INR  1.1 0.8 - 1.2    Comment: (NOTE) INR goal varies based on device and disease states. Performed at [facility], [address] [place], [place] [address]   Cortisol-am, blood     Status: Abnormal   Collection Time: 07/18/20  6:38 AM  Result Value Ref Range   Cortisol - AM 4.9 (L) 6.7 - 22.6 ug/dL    Comment: Performed at [facility] [address] [place], [place] [address]  Procalcitonin     Status: None   Collection Time: 07/18/20  6:38 AM  Result Value Ref Range   Procalcitonin 0.11 ng/mL    Comment:        Interpretation: PCT (Procalcitonin) <= 0.5 ng/mL: Systemic infection (sepsis) is not likely. Local bacterial infection is possible. (NOTE)       Sepsis PCT Algorithm           Lower Respiratory Tract                                      Infection PCT Algorithm    ----------------------------     ----------------------------         PCT < 0.25 ng/mL                PCT < 0.10 ng/mL           Strongly encourage             Strongly discourage   discontinuation of antibiotics    initiation of antibiotics    ----------------------------     -----------------------------       PCT 0.25 - 0.50 ng/mL            PCT 0.10 - 0.25 ng/mL               OR       >80% decrease in PCT            Discourage initiation of                                            antibiotics      Encourage discontinuation           of antibiotics    ----------------------------     -----------------------------         PCT >= 0.50 ng/mL              PCT 0.26 - 0.50 ng/mL               AND        <80% decrease in PCT             Encourage initiation of                                             antibiotics       Encourage continuation           of antibiotics    ----------------------------     -----------------------------        PCT >= 0.50 ng/mL                  PCT > 0.50 ng/mL  AND         increase in PCT                  Strongly encourage                                      initiation of antibiotics    Strongly encourage escalation           of antibiotics                                     -----------------------------                                           PCT <= 0.25 ng/mL                                                 OR                                        > 80% decrease in PCT                                      Discontinue / Do not initiate                                             antibiotics  Performed at [facility], [address] [place], [place] [address]   CBC     Status: Abnormal   Collection Time: 07/18/20  6:38 AM  Result Value Ref Range   WBC 21.3 (H) 4.0 - 10.5 K/uL   RBC 4.28 3.87 - 5.11 MIL/uL   Hemoglobin 9.7 (L) 12.0 - 15.0 g/dL   HCT 33.4 (L) 36 - 46 %   MCV 78.0 (L) 80.0 - 100.0 fL   MCH 22.7 (L) 26.0 - 34.0 pg   MCHC 29.0 (L) 30.0 - 36.0 g/dL   RDW 18.6 (H) 11.5 - 15.5 %   Platelets 285 150 - 400 K/uL   nRBC 0.0 0.0  - 0.2 %    Comment: Performed at [facility], [address] [place], [place] [address]  Creatinine, serum     Status: None   Collection Time: 07/18/20  6:38 AM  Result Value Ref Range   Creatinine, Ser 0.72 0.44 - 1.00 mg/dL   GFR, Estimated >60 >60 mL/min    Comment: Performed at [facility], [address] [place], [place] [address]  C-reactive protein     Status: Abnormal   Collection Time: 07/18/20  6:38 AM  Result Value Ref Range   CRP 19.9 (H) <1.0 mg/dL    Comment: Performed at [facility] [address], [place], [place] [address]   No components found for: ESR, C REACTIVE  PROTEIN MICRO: Recent Results (from the past 720 hour(s))  Blood Culture (routine x 2)     Status: None (Preliminary result)   Collection Time: 07/17/20  5:29 AM   Specimen: BLOOD  Result Value Ref Range Status   Specimen Description BLOOD LEFT [facility]  Final   Special Requests   Final    BOTTLES DRAWN AEROBIC AND ANAEROBIC Blood Culture adequate volume   Culture   Final    NO GROWTH 1 DAY Performed at [facility], [address], [place], [place] [address]    Report Status PENDING  Incomplete  Blood Culture (routine x 2)     Status: None (Preliminary result)   Collection Time: 07/17/20  5:30 AM   Specimen: BLOOD  Result Value Ref Range Status   Specimen Description BLOOD RIGHT [facility]  Final   Special Requests   Final    BOTTLES DRAWN AEROBIC AND ANAEROBIC Blood Culture adequate volume   Culture   Final    NO GROWTH 1 DAY Performed at [facility], [address] [place], [place] [address]    Report Status PENDING  Incomplete  Respiratory Panel by RT PCR (Flu A&B, Covid) - Nasopharyngeal Swab     Status: None   Collection Time: 07/17/20  8:30 AM   Specimen: Nasopharyngeal Swab  Result Value Ref Range Status   SARS Coronavirus 2 by RT PCR NEGATIVE NEGATIVE Final    Comment: (NOTE) SARS-CoV-2 target nucleic acids are NOT DETECTED.  The SARS-CoV-2  RNA is generally detectable in upper respiratoy specimens during the acute phase of infection. The lowest concentration of SARS-CoV-2 viral copies this assay can detect is 131 copies/mL. A negative result does not preclude SARS-Cov-2 infection and should not be used as the sole basis for treatment or other patient management decisions. A negative result may occur with  improper specimen collection/handling, submission of specimen other than nasopharyngeal swab, presence of viral mutation(s) within the areas targeted by this assay, and inadequate number of viral copies (<131 copies/mL). A negative result must be combined with clinical observations, patient history, and epidemiological information. The expected result is Negative.  Fact Sheet for Patients:  PinkCheek.be  Fact Sheet for Healthcare Providers:  GravelBags.it  This test is no t yet approved or cleared by the [place] FDA and  has been authorized for detection and/or diagnosis of SARS-CoV-2 by FDA under an Emergency Use Authorization (EUA). This EUA will remain  in effect (meaning this test can be used) for the duration of the COVID-19 declaration under Section 564(b)(1) of the Act, 21 U.S.C. section 360bbb-3(b)(1), unless the authorization is terminated or revoked sooner.     Influenza A by PCR NEGATIVE NEGATIVE Final   Influenza B by PCR NEGATIVE NEGATIVE Final    Comment: (NOTE) The Xpert Xpress SARS-CoV-2/FLU/RSV assay is intended as an aid in  the diagnosis of influenza from Nasopharyngeal swab specimens and  should not be used as a sole basis for treatment. Nasal washings and  aspirates are unacceptable for Xpert Xpress SARS-CoV-2/FLU/RSV  testing.  Fact Sheet for Patients: PinkCheek.be  Fact Sheet for Healthcare Providers: GravelBags.it  This test is not yet approved or cleared by the  [place] FDA and  has been authorized for detection and/or diagnosis of SARS-CoV-2 by  FDA under an Emergency Use Authorization (EUA). This EUA will remain  in effect (meaning this test can be used) for the duration of the  Covid-19 declaration under Section 564(b)(1) of the Act, 21  U.S.C. section 360bbb-3(b)(1), unless the authorization is  terminated or revoked. Performed at [facility], [address] [place], [place] [address]   MRSA PCR Screening     Status: None   Collection Time: 07/18/20 12:30 AM   Specimen: Nasopharyngeal  Result Value Ref Range Status   MRSA by PCR NEGATIVE NEGATIVE Final    Comment:        The GeneXpert MRSA Assay (FDA approved for NASAL specimens only), is one component of a comprehensive MRSA colonization surveillance program. It is not intended to diagnose MRSA infection nor to guide or monitor treatment for MRSA infections. Performed at [facility], [address] [place], [place] [address]     IMAGING: DG Chest Port 1 View  Result Date: 07/17/2020 CLINICAL DATA:  Fever, bilateral leg pain for 2 days EXAM: PORTABLE CHEST 1 VIEW COMPARISON:  Radiograph 05/20/2020 FINDINGS: Streaky opacities in the lung bases with low lung volumes. Normal pulmonary vascularity. No focal consolidative opacity is seen. No pneumothorax or visible effusion. The cardiomediastinal contours are unremarkable. No acute osseous or soft tissue abnormality. Telemetry leads overlie the chest. IMPRESSION: Low volumes with streaky basilar opacities, favored to reflect atelectasis versus early infection. Electronically Signed   By: Lovena Le M.D.   On: 07/17/2020 16:38   DG Foot Complete Left  Result Date: 07/17/2020 CLINICAL DATA:  Cellulitis, concern for osteomyelitis EXAM: LEFT FOOT - COMPLETE 3+ VIEW COMPARISON:  None. FINDINGS: No fracture or dislocation is seen. The joint spaces are preserved. The visualized soft tissues are unremarkable. No  osseous destruction to suggest osteomyelitis. IMPRESSION: No radiographic findings to suggest osteomyelitis. Electronically Signed   By: Julian Hy M.D.   On: 07/17/2020 06:13   DG Foot Complete Right  Result Date: 07/17/2020 CLINICAL DATA:  Cellulitis.  Concern for osteomyelitis. EXAM: RIGHT FOOT COMPLETE - 3+ VIEW COMPARISON:  Right foot radiographs 05/16/2020 FINDINGS: Forefoot amputation at the level of the metatarsals is noted. Soft tissue swelling is present anterior to the osteotomy sites. Residual metatarsals are within normal limits. No osseous erosion is present. No gas is present in the soft tissues. Midfoot and hindfoot are within normal limits. IMPRESSION: 1. Forefoot amputation without radiographic evidence for osteomyelitis. 2. Soft tissue swelling anterior to the osteotomy sites. Electronically Signed   By: San Morelle M.D.   On: 07/17/2020 06:13    Assessment:   Regina Carpenter is a 44 y.o. female with RA, vasculitis complicated by chronic progressive LE ulcers now with worsening pain, fevers and leukocytosis. She may have superinfection of wounds and moderate cellulitis but it is difficult to tell.  No drainage from the wounds that would give something to culture  Recommendations COnt vanco and ceftriaxone over weekend but if improving can transition to oral keflex to complete a 10 day course. Will need further wound care management of her LE wounds and fu at vasculitis clinic  Thank you very much for allowing me to participate in the care of this patient. Please call with questions.   Cheral Marker. Ola Spurr, MD

## 2020-07-19 LAB — BASIC METABOLIC PANEL
Anion gap: 7 (ref 5–15)
BUN: 11 mg/dL (ref 6–20)
CO2: 25 mmol/L (ref 22–32)
Calcium: 8.9 mg/dL (ref 8.9–10.3)
Chloride: 105 mmol/L (ref 98–111)
Creatinine, Ser: 0.59 mg/dL (ref 0.44–1.00)
GFR, Estimated: >60 mL/min (ref >60)
Glucose, Bld: 100 mg/dL — ABNORMAL HIGH (ref 70–99)
Potassium: 4 mmol/L (ref 3.5–5.1)
Sodium: 137 mmol/L (ref 135–145)

## 2020-07-19 LAB — CBC
HCT: 31.4 % — ABNORMAL LOW (ref 36.0–46.0)
Hemoglobin: 9.2 g/dL — ABNORMAL LOW (ref 12.0–15.0)
MCH: 22.7 pg — ABNORMAL LOW (ref 26.0–34.0)
MCHC: 29.3 g/dL — ABNORMAL LOW (ref 30.0–36.0)
MCV: 77.5 fL — ABNORMAL LOW (ref 80.0–100.0)
Platelets: 285 10*3/uL (ref 150–400)
RBC: 4.05 MIL/uL (ref 3.87–5.11)
RDW: 18.5 % — ABNORMAL HIGH (ref 11.5–15.5)
WBC: 21.2 10*3/uL — ABNORMAL HIGH (ref 4.0–10.5)
nRBC: 0 % (ref 0.0–0.2)

## 2020-07-19 MED ORDER — VANCOMYCIN HCL 1250 MG/250ML IV SOLN
1250.0000 mg | Freq: Two times a day (BID) | INTRAVENOUS | Status: DC
  Administered 2020-07-19 – 2020-07-22 (×6): 1250 mg via INTRAVENOUS
  Filled 2020-07-19 (×8): qty 250

## 2020-07-19 MED ORDER — OXYCODONE-ACETAMINOPHEN 5-325 MG PO TABS
1.0000 | ORAL_TABLET | ORAL | Status: DC | PRN
  Administered 2020-07-19 – 2020-07-25 (×16): 2 via ORAL
  Filled 2020-07-19 (×19): qty 2

## 2020-07-19 MED ORDER — CYCLOPHOSPHAMIDE 25 MG PO CAPS
100.0000 mg | ORAL_CAPSULE | Freq: Every day | ORAL | Status: DC
  Administered 2020-07-22 – 2020-07-23 (×2): 100 mg via ORAL
  Filled 2020-07-19 (×3): qty 4

## 2020-07-19 NOTE — Progress Notes (Signed)
Spoke with pharmacy regarding 0400 Vanc dose that was found to be infiltrated around 0800. Bag was 3/4 full. Difficulty with re-insertion of IV site. Vanc re-timed.

## 2020-07-19 NOTE — Consult Note (Signed)
Pharmacy Antibiotic Note  Regina Carpenter is a 44 y.o. 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  Pharmacy has been consulted for vancomycin dosing. Immunosuppression has been continued and steroid dosing escalated given suspected progression of connective tissue disease.   Today, 07/19/20  --WBC 20.9 > 21.3 > 21.2; PCT 0.11 on 10/15 --CRP trending up significantly --SCr largely stable  Plan:   Continue vancomycin 1250 mg IV Q 12 hrs  Daily Scr while on vancomycin to monitor renal function  Vancomycin levels as clinically indicated (goal trough 10-15 mcg/mL for cellulitis)  Call from RN saying that IV found to be infiltrated around 0800; the 0400 dose of vancomycin bag still 3/4 full. Will retime vancomycin 1250 mg IV q12h to restart at 1100 since not much of previous dose was given.   Height: 5\' 5"  (165.1 cm) Weight: 93.4 kg (206 lb) IBW/kg (Calculated) : 57  Temp (24hrs), Avg:98.3 F (36.8 C), Min:97.9 F (36.6 C), Max:98.7 F (37.1 C)  Recent Labs  Lab 07/17/20 0313 07/17/20 0530 07/17/20 0815 07/17/20 1450 07/18/20 0638 07/19/20 0404  WBC 20.9*  --   --   --  21.3* 21.2*  CREATININE 0.75  --   --  0.81 0.72 0.59  LATICACIDVEN 2.3* 3.1* 1.7  --   --   --     Estimated Creatinine Clearance: 100.4 mL/min (by C-G formula based on SCr of 0.59 mg/dL).    Antimicrobials this admission: ceftriaxone 10/14 >>  vancomycin 10/14 >>   Microbiology results: 10/14 BCx: NGTD 10/14 SARS CoV-2: negative  10/14 influenza A/B: negative 10/15 MRSA PCR negative  Thank you for allowing pharmacy to be a part of this patient's care.  Rocky Morel 07/19/2020 10:26 AM

## 2020-07-19 NOTE — Progress Notes (Addendum)
PROGRESS NOTE    Regina Carpenter  ZOX:[id number] DOB: April 01, 1976 DOA: 07/17/2020 PCP: Emmaline Kluver., MD    Brief Narrative:  Regina Carpenter is a 44 y.o. female with medical history significant for rheumatoid arthritis, polyarteritis vasculitis, chronic bilateral foot wounds who presents to the emergency department for evaluation of worsening pain in her left lower extremity. With leg ulcers due to her vasculitis. Started on iv abx.     Consultants:   Rheumatology   Wound care nursing  ID  Podiatry  Procedures:   Antimicrobials:   Rocephin and vancomycin    Subjective: Still with leg pain, no other complaints.   Objective: Vitals:   07/19/20 0425 07/19/20 0425 07/19/20 0726 07/19/20 1109  BP:  (!) 150/98 126/86 (!) 122/91  Pulse: (!) 102 99 (!) 104 83  Resp: 14 17 17 17   Temp: 98.2 F (36.8 C) 98.3 F (36.8 C) 98.1 F (36.7 C) 97.7 F (36.5 C)  TempSrc: Oral Oral Oral Oral  SpO2:  100% 100% 100%  Weight:      Height:        Intake/Output Summary (Last 24 hours) at 07/19/2020 1622 Last data filed at 07/19/2020 1402 Gross per 24 hour  Intake 720 ml  Output --  Net 720 ml   Filed Weights   07/17/20 0308  Weight: 93.4 kg    Examination: Calm, comfortable cta no w/r/r Regular s1/s2 no murmurs Soft bening , +bs, nt Multiple open wounds same as before L>R. No visible drainage aaxox3 Mood and affect appropriate in current setting     Data Reviewed: I have personally reviewed following labs and imaging studies  CBC: Recent Labs  Lab 07/17/20 0313 07/18/20 0638 07/19/20 0404  WBC 20.9* 21.3* 21.2*  NEUTROABS 14.9*  --   --   HGB 10.7* 9.7* 9.2*  HCT 36.1 33.4* 31.4*  MCV 77.6* 78.0* 77.5*  PLT 303 285 811   Basic Metabolic Panel: Recent Labs  Lab 07/17/20 0313 07/17/20 1450 07/18/20 0638 07/19/20 0404  NA 138 137  --  137  K 3.6 3.7  --  4.0  CL 101 102  --  105  CO2 26 24  --  25  GLUCOSE 88 90  --  100*  BUN 8 6   --  11  CREATININE 0.75 0.81 0.72 0.59  CALCIUM 9.1 8.8*  --  8.9   GFR: Estimated Creatinine Clearance: 100.4 mL/min (by C-G formula based on SCr of 0.59 mg/dL). Liver Function Tests: Recent Labs  Lab 07/17/20 0313  AST 14*  ALT 11  ALKPHOS 67  BILITOT 0.6  PROT 7.8  ALBUMIN 3.7   No results for input(s): LIPASE, AMYLASE in the last 168 hours. No results for input(s): AMMONIA in the last 168 hours. Coagulation Profile: Recent Labs  Lab 07/18/20 0638  INR 1.1   Cardiac Enzymes: No results for input(s): CKTOTAL, CKMB, CKMBINDEX, TROPONINI in the last 168 hours. BNP (last 3 results) No results for input(s): PROBNP in the last 8760 hours. HbA1C: No results for input(s): HGBA1C in the last 72 hours. CBG: No results for input(s): GLUCAP in the last 168 hours. Lipid Profile: No results for input(s): CHOL, HDL, LDLCALC, TRIG, CHOLHDL, LDLDIRECT in the last 72 hours. Thyroid Function Tests: No results for input(s): TSH, T4TOTAL, FREET4, T3FREE, THYROIDAB in the last 72 hours. Anemia Panel: No results for input(s): VITAMINB12, FOLATE, FERRITIN, TIBC, IRON, RETICCTPCT in the last 72 hours. Sepsis Labs: Recent Labs  Lab 07/17/20 0313 07/17/20  0530 07/17/20 0815 07/18/20 0638  PROCALCITON  --   --   --  0.11  LATICACIDVEN 2.3* 3.1* 1.7  --     Recent Results (from the past 240 hour(s))  Blood Culture (routine x 2)     Status: None (Preliminary result)   Collection Time: 07/17/20  5:29 AM   Specimen: BLOOD  Result Value Ref Range Status   Specimen Description BLOOD LEFT [facility]  Final   Special Requests   Final    BOTTLES DRAWN AEROBIC AND ANAEROBIC Blood Culture adequate volume   Culture   Final    NO GROWTH 2 DAYS Performed at [facility], [address], [place], [place] [address]    Report Status PENDING  Incomplete  Blood Culture (routine x 2)     Status: None (Preliminary result)   Collection Time: 07/17/20  5:30 AM   Specimen: BLOOD  Result Value Ref  Range Status   Specimen Description BLOOD RIGHT [facility]  Final   Special Requests   Final    BOTTLES DRAWN AEROBIC AND ANAEROBIC Blood Culture adequate volume   Culture   Final    NO GROWTH 2 DAYS Performed at [facility], [address] [place], [place] [address]    Report Status PENDING  Incomplete  Respiratory Panel by RT PCR (Flu A&B, Covid) - Nasopharyngeal Swab     Status: None   Collection Time: 07/17/20  8:30 AM   Specimen: Nasopharyngeal Swab  Result Value Ref Range Status   SARS Coronavirus 2 by RT PCR NEGATIVE NEGATIVE Final    Comment: (NOTE) SARS-CoV-2 target nucleic acids are NOT DETECTED.  The SARS-CoV-2 RNA is generally detectable in upper respiratoy specimens during the acute phase of infection. The lowest concentration of SARS-CoV-2 viral copies this assay can detect is 131 copies/mL. A negative result does not preclude SARS-Cov-2 infection and should not be used as the sole basis for treatment or other patient management decisions. A negative result may occur with  improper specimen collection/handling, submission of specimen other than nasopharyngeal swab, presence of viral mutation(s) within the areas targeted by this assay, and inadequate number of viral copies (<131 copies/mL). A negative result must be combined with clinical observations, patient history, and epidemiological information. The expected result is Negative.  Fact Sheet for Patients:  PinkCheek.be  Fact Sheet for Healthcare Providers:  GravelBags.it  This test is no t yet approved or cleared by the [place] FDA and  has been authorized for detection and/or diagnosis of SARS-CoV-2 by FDA under an Emergency Use Authorization (EUA). This EUA will remain  in effect (meaning this test can be used) for the duration of the COVID-19 declaration under Section 564(b)(1) of the Act, 21 U.S.C. section 360bbb-3(b)(1), unless the  authorization is terminated or revoked sooner.     Influenza A by PCR NEGATIVE NEGATIVE Final   Influenza B by PCR NEGATIVE NEGATIVE Final    Comment: (NOTE) The Xpert Xpress SARS-CoV-2/FLU/RSV assay is intended as an aid in  the diagnosis of influenza from Nasopharyngeal swab specimens and  should not be used as a sole basis for treatment. Nasal washings and  aspirates are unacceptable for Xpert Xpress SARS-CoV-2/FLU/RSV  testing.  Fact Sheet for Patients: PinkCheek.be  Fact Sheet for Healthcare Providers: GravelBags.it  This test is not yet approved or cleared by the [place] FDA and  has been authorized for detection and/or diagnosis of SARS-CoV-2 by  FDA under an Emergency Use Authorization (EUA). This EUA will  remain  in effect (meaning this test can be used) for the duration of the  Covid-19 declaration under Section 564(b)(1) of the Act, 21  U.S.C. section 360bbb-3(b)(1), unless the authorization is  terminated or revoked. Performed at [facility], [address] [place], [place] [address]   MRSA PCR Screening     Status: None   Collection Time: 07/18/20 12:30 AM   Specimen: Nasopharyngeal  Result Value Ref Range Status   MRSA by PCR NEGATIVE NEGATIVE Final    Comment:        The GeneXpert MRSA Assay (FDA approved for NASAL specimens only), is one component of a comprehensive MRSA colonization surveillance program. It is not intended to diagnose MRSA infection nor to guide or monitor treatment for MRSA infections. Performed at [facility], [address] [place], [place] [address]          Radiology Studies: DG Chest [place] 1 View  Result Date: 07/17/2020 CLINICAL DATA:  Fever, bilateral leg pain for 2 days EXAM: PORTABLE CHEST 1 VIEW COMPARISON:  Radiograph 05/20/2020 FINDINGS: Streaky opacities in the lung bases with low lung volumes. Normal pulmonary vascularity. No  focal consolidative opacity is seen. No pneumothorax or visible effusion. The cardiomediastinal contours are unremarkable. No acute osseous or soft tissue abnormality. Telemetry leads overlie the chest. IMPRESSION: Low volumes with streaky basilar opacities, favored to reflect atelectasis versus early infection. Electronically Signed   By: Lovena Le M.D.   On: 07/17/2020 16:38        Scheduled Meds: . amLODipine  2.5 mg Oral Daily  . collagenase   Topical Daily  . cyclophosphamide  100 mg Oral Daily  . enoxaparin (LOVENOX) injection  0.5 mg/kg Subcutaneous Q24H  . famotidine  20 mg Oral Daily  . methylPREDNISolone (SOLU-MEDROL) injection  40 mg Intravenous Q12H  . nicotine  14 mg Transdermal Daily   Continuous Infusions: . cefTRIAXone (ROCEPHIN)  IV 1 g (07/18/20 2035)  . 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[place])   Essential hypertension   Bilateral lower leg cellulitis  Sepsis Present on admission and evidenced by low-grade fever with a T-max of 99, tachycardia, marked leukocytosis with a left shift, bilateral lower extremity ulcers with purulent drainage and elevated lactic acid level present on admission ID consulted for LE ulcers, input appreciated, recommend continuing vancomycin and ceftriaxone over the weekend but if improving can transition to oral Keflex to complete 10-day course   Ischemic ulcer of toes on left foot Patient with a history of systemic vasculitis admitted to the hospital for evaluation of ischemic ulcers involving the second and left fifth toe She is 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  Change foam dressings Q 3 days or PRN soiling. Apply xeroform gauze to right stump wound and left toe wounds Q day and cover with kerlex. Will need to follow-up as vasculitis clinic as outpatient at [facility] consulted and input appreciated-no need for amputation as gangrenous change on the left fifth toe is stable and right foot is stable.  Has not progressed.  Recommended wound care management Will eventually need to be followed at [facility] as outpatient Pain management  Medium vessel vasculitis consistent with polyarthritis  Rheumatologist  input was appreciated No improvement with prednisone, methotrexate, Cytoxan Now with worsening ulcerative and ischemic lesions in lower extremity Had prior connective tissue disease with uveitis, inflammatory arthritis.  Status post methotrexate and prednisone 10/16-Was suppose to be transferred to [facility], but no beds avialble Rheumatology recommended-via chat  continuing IV steroids and increasing cyclophosphamide dose from 5 mg to 10 mg. Will need to f/u [facility] as outpt when discharged   Nicotine dependence Counseled on admission about smoking cessation Continue nicotine patch  Hypertension Well-controlled  Continue with amlodipine      DVT prophylaxis: Lovenox Code Status: Full Family Communication: None at bedside  Status is: Inpatient  Remains inpatient appropriate because:IV treatments appropriate due to intensity of illness or inability to take PO   Dispo: The patient is from: Home              Anticipated d/c is to: Home              Anticipated d/c date is: 3 days              Patient currently is not medically stable to d/c.            LOS: 2 days   Time spent: 35 minutes with >50% on coc    Nolberto Hanlon, MD [facility] Pager [phone]  If 7PM-7AM, please contact night-coverage [email or web address] Password  [email or web address] 07/19/2020, 4:22 PM

## 2020-07-20 LAB — URINALYSIS, COMPLETE (UACMP) WITH MICROSCOPIC
Bacteria, UA: NONE SEEN
Bilirubin Urine: NEGATIVE
Glucose, UA: NEGATIVE mg/dL
Ketones, ur: NEGATIVE mg/dL
Leukocytes,Ua: NEGATIVE
Nitrite: NEGATIVE
Protein, ur: NEGATIVE mg/dL
RBC / HPF: >50 RBC/hpf — ABNORMAL HIGH (ref 0–5)
Specific Gravity, Urine: 1.011 (ref 1.005–1.030)
pH: 6 (ref 5.0–8.0)

## 2020-07-20 LAB — CREATININE, SERUM
Creatinine, Ser: 0.68 mg/dL (ref 0.44–1.00)
GFR, Estimated: >60 mL/min (ref >60)

## 2020-07-20 LAB — CBC
HCT: 30.5 % — ABNORMAL LOW (ref 36.0–46.0)
Hemoglobin: 8.9 g/dL — ABNORMAL LOW (ref 12.0–15.0)
MCH: 22.9 pg — ABNORMAL LOW (ref 26.0–34.0)
MCHC: 29.2 g/dL — ABNORMAL LOW (ref 30.0–36.0)
MCV: 78.4 fL — ABNORMAL LOW (ref 80.0–100.0)
Platelets: 299 10*3/uL (ref 150–400)
RBC: 3.89 MIL/uL (ref 3.87–5.11)
RDW: 18.4 % — ABNORMAL HIGH (ref 11.5–15.5)
WBC: 12.9 10*3/uL — ABNORMAL HIGH (ref 4.0–10.5)
nRBC: 0 % (ref 0.0–0.2)

## 2020-07-20 MED ORDER — SODIUM CHLORIDE 0.9% FLUSH: 10.0000 mL | INTRAVENOUS | Status: DC | PRN

## 2020-07-20 MED ORDER — GABAPENTIN 100 MG PO CAPS
100.0000 mg | ORAL_CAPSULE | Freq: Three times a day (TID) | ORAL | Status: DC
  Administered 2020-07-20 – 2020-07-21 (×3): 100 mg via ORAL
  Filled 2020-07-20 (×3): qty 1

## 2020-07-20 MED ORDER — SODIUM CHLORIDE 0.9% FLUSH
10.0000 mL | Freq: Two times a day (BID) | INTRAVENOUS | Status: DC
  Administered 2020-07-20 – 2020-07-25 (×10): 10 mL

## 2020-07-20 NOTE — Consult Note (Signed)
Follow-up vasculitis Temperature down.  No recent shortness of breath.  Chest x-ray unremarkable.  Creatinine normal On antibiotics Developed small punctate erythematous lesions which then ulcerate.  New lesions began about 2 weeks ago. IV steroids.  P.o. Cytoxan.  Now at 100 mg No abdominal pain  Exam: Alert.  Clear chest.  Numerous lower extremity lesions.  She is bandaging them during exam.  None are weeping.  No synovitis.  Impression :polyarteritis nodosa.  Predominant cutaneous involvement with prior ischemic digits.  Biopsy shows medium vessel vasculitis.  All serologies previously negative.  Negative hepatitis markers and anticoagulant studies Seems to have failed high-dose steroids since June and prior methotrexate and current Cytoxan.  Recommendations. When infection cleared, would recommend IVIG. Which is used in resistant polyarteritis nodosa and cutaneous polyarteritis when other drugs have failed.  I discussed with patient and she is in agreement.  Discussed with Dr. Kurtis Bushman of the hospitalist service.  Hopefully she can receive this during this hospitalization this week.  Would then lower prednisone and discontinue Cytoxan

## 2020-07-20 NOTE — Progress Notes (Signed)
PROGRESS NOTE    Regina Carpenter  VEH:[id number] DOB: [birth year] DOA: 07/17/2020 PCP: Emmaline Kluver., MD    Brief Narrative:  Regina Carpenter is a 44 y.o. female with medical history significant for rheumatoid arthritis, polyarteritis vasculitis, chronic bilateral foot wounds who presents to the emergency department for evaluation of worsening pain in her left lower extremity. With leg ulcers due to her vasculitis.On iv vanco and Rocephin.       Consultants:   Rheumatology   Wound care nursing  ID  Podiatry  Procedures:   Antimicrobials:   Rocephin and vancomycin    Subjective: With LE pain , nothing new. No abd pain/sob/cp  Objective: Vitals:   07/19/20 2017 07/20/20 0007 07/20/20 0424 07/20/20 0827  BP: 113/73 136/87 (!) 137/95 (!) 139/102  Pulse: 84 94 77 81  Resp: 16 16 16 17   Temp: 97.6 F (36.4 C) 97.6 F (36.4 C) 98.5 F (36.9 C) 98.7 F (37.1 C)  TempSrc: Oral Oral Oral Oral  SpO2: 99% 100% 100% 100%  Weight:      Height:        Intake/Output Summary (Last 24 hours) at 07/20/2020 1139 Last data filed at 07/19/2020 1847 Gross per 24 hour  Intake 240 ml  Output --  Net 240 ml   Filed Weights   07/17/20 0308  Weight: 93.4 kg    Examination: Calm, comfortable CTA, no wheeze rales rhonchi RRR S1-S2 no murmurs rubs Soft benign positive bowel sounds Mild bilateral edema, multiple left cutaneous ulcers minimal drainage left > right Alert oriented x3 Mood and affect appropriate in current setting    Data Reviewed: I have personally reviewed following labs and imaging studies  CBC: Recent Labs  Lab 07/17/20 0313 07/18/20 0638 07/19/20 0404 07/20/20 0528  WBC 20.9* 21.3* 21.2* 12.9*  NEUTROABS 14.9*  --   --   --   HGB 10.7* 9.7* 9.2* 8.9*  HCT 36.1 33.4* 31.4* 30.5*  MCV 77.6* 78.0* 77.5* 78.4*  PLT 303 285 285 836   Basic Metabolic Panel: Recent Labs  Lab 07/17/20 0313 07/17/20 1450 07/18/20 0638 07/19/20 0404  07/20/20 0528  NA 138 137  --  137  --   K 3.6 3.7  --  4.0  --   CL 101 102  --  105  --   CO2 26 24  --  25  --   GLUCOSE 88 90  --  100*  --   BUN 8 6  --  11  --   CREATININE 0.75 0.81 0.72 0.59 0.68  CALCIUM 9.1 8.8*  --  8.9  --    GFR: Estimated Creatinine Clearance: 100.4 mL/min (by C-G formula based on SCr of 0.68 mg/dL). Liver Function Tests: Recent Labs  Lab 07/17/20 0313  AST 14*  ALT 11  ALKPHOS 67  BILITOT 0.6  PROT 7.8  ALBUMIN 3.7   No results for input(s): LIPASE, AMYLASE in the last 168 hours. No results for input(s): AMMONIA in the last 168 hours. Coagulation Profile: Recent Labs  Lab 07/18/20 0638  INR 1.1   Cardiac Enzymes: No results for input(s): CKTOTAL, CKMB, CKMBINDEX, TROPONINI in the last 168 hours. BNP (last 3 results) No results for input(s): PROBNP in the last 8760 hours. HbA1C: No results for input(s): HGBA1C in the last 72 hours. CBG: No results for input(s): GLUCAP in the last 168 hours. Lipid Profile: No results for input(s): CHOL, HDL, LDLCALC, TRIG, CHOLHDL, LDLDIRECT in the last 72 hours. Thyroid Function  Tests: No results for input(s): TSH, T4TOTAL, FREET4, T3FREE, THYROIDAB in the last 72 hours. Anemia Panel: No results for input(s): VITAMINB12, FOLATE, FERRITIN, TIBC, IRON, RETICCTPCT in the last 72 hours. Sepsis Labs: Recent Labs  Lab 07/17/20 0313 07/17/20 0530 07/17/20 0815 07/18/20 5732  PROCALCITON  --   --   --  0.11  LATICACIDVEN 2.3* 3.1* 1.7  --     Recent Results (from the past 240 hour(s))  Blood Culture (routine x 2)     Status: None (Preliminary result)   Collection Time: 07/17/20  5:29 AM   Specimen: BLOOD  Result Value Ref Range Status   Specimen Description BLOOD LEFT [facility]  Final   Special Requests   Final    BOTTLES DRAWN AEROBIC AND ANAEROBIC Blood Culture adequate volume   Culture   Final    NO GROWTH 3 DAYS Performed at [facility], [address] [place], [place] [address]     Report Status PENDING  Incomplete  Blood Culture (routine x 2)     Status: None (Preliminary result)   Collection Time: 07/17/20  5:30 AM   Specimen: BLOOD  Result Value Ref Range Status   Specimen Description BLOOD RIGHT [facility]  Final   Special Requests   Final    BOTTLES DRAWN AEROBIC AND ANAEROBIC Blood Culture adequate volume   Culture   Final    NO GROWTH 3 DAYS Performed at [facility], [address], [place],  [address]    Report Status PENDING  Incomplete  Respiratory Panel by RT PCR (Flu A&B, Covid) - Nasopharyngeal Swab     Status: None   Collection Time: 07/17/20  8:30 AM   Specimen: Nasopharyngeal Swab  Result Value Ref Range Status   SARS Coronavirus 2 by RT PCR NEGATIVE NEGATIVE Final    Comment: (NOTE) SARS-CoV-2 target nucleic acids are NOT DETECTED.  The SARS-CoV-2 RNA is generally detectable in upper respiratoy specimens during the acute phase of infection. The lowest concentration of SARS-CoV-2 viral copies this assay can detect is 131 copies/mL. A negative result does not preclude SARS-Cov-2 infection and should not be used as the sole basis for treatment or other patient management decisions. A negative result may occur with  improper specimen collection/handling, submission of specimen other than nasopharyngeal swab, presence of viral mutation(s) within the areas targeted by this assay, and inadequate number of viral copies (<131 copies/mL). A negative result must be combined with clinical observations, patient history, and epidemiological information. The expected result is Negative.  Fact Sheet for Patients:  PinkCheek.be  Fact Sheet for Healthcare Providers:  GravelBags.it  This test is no t yet approved or cleared by the [place] FDA and  has been authorized for detection and/or diagnosis of SARS-CoV-2 by FDA under an Emergency Use Authorization (EUA). This EUA will remain    in effect (meaning this test can be used) for the duration of the COVID-19 declaration under Section 564(b)(1) of the Act, 21 U.S.C. section 360bbb-3(b)(1), unless the authorization is terminated or revoked sooner.     Influenza A by PCR NEGATIVE NEGATIVE Final   Influenza B by PCR NEGATIVE NEGATIVE Final    Comment: (NOTE) The Xpert Xpress SARS-CoV-2/FLU/RSV assay is intended as an aid in  the diagnosis of influenza from Nasopharyngeal swab specimens and  should not be used as a sole basis for treatment. Nasal washings and  aspirates are unacceptable for Xpert Xpress SARS-CoV-2/FLU/RSV  testing.  Fact Sheet for Patients: PinkCheek.be  Fact  Sheet for Healthcare Providers: GravelBags.it  This test is not yet approved or cleared by the [place] and  has been authorized for detection and/or diagnosis of SARS-CoV-2 by  FDA under an Emergency Use Authorization (EUA). This EUA will remain  in effect (meaning this test can be used) for the duration of the  Covid-19 declaration under Section 564(b)(1) of the Act, 21  U.S.C. section 360bbb-3(b)(1), unless the authorization is  terminated or revoked. Performed at [facility], [address] [place], [place] [address]   MRSA PCR Screening     Status: None   Collection Time: 07/18/20 12:30 AM   Specimen: Nasopharyngeal  Result Value Ref Range Status   MRSA by PCR NEGATIVE NEGATIVE Final    Comment:        The GeneXpert MRSA Assay (FDA approved for NASAL specimens only), is one component of a comprehensive MRSA colonization surveillance program. It is not intended to diagnose MRSA infection nor to guide or monitor treatment for MRSA infections. Performed at [facility], [address], [place], [place] [address]          Radiology Studies: No results found.      Scheduled Meds: . amLODipine  2.5 mg Oral Daily  . collagenase    Topical Daily  . cyclophosphamide  100 mg Oral Daily  . enoxaparin (LOVENOX) injection  0.5 mg/kg Subcutaneous Q24H  . famotidine  20 mg Oral Daily  . methylPREDNISolone (SOLU-MEDROL) injection  40 mg Intravenous Q12H  . nicotine  14 mg Transdermal Daily   Continuous Infusions: . cefTRIAXone (ROCEPHIN)  IV 1 g (07/19/20 2239)  . 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 ([place])   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[facility])   Essential hypertension   Bilateral lower leg cellulitis  Sepsis Present on admission and evidenced by low-grade fever with a T-max of 99, tachycardia, marked leukocytosis with a left shift, bilateral lower extremity ulcers with purulent drainage and elevated lactic acid level present on admission -10/17-leukocytosis improving, afebrile -ID following, recommended vancomycin and ceftriaxone over the weekend if improving transition to oral Keflex to complete 10-day course  For now we will continue with IV antibiotics   Ischemic ulcer of toes on left foot Patient with a history of systemic vasculitis admitted to the hospital for evaluation of ischemic ulcers involving the second and left fifth toe She is 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   We will need to complete a 10-day course total for antibiotic per ID  Wound care following-recommended enzymatic debridement please see instructions on wound care dressing We will need to follow-up with vasculitic clinic as outpatient at [facility] is following-there is no need for amputation as gangrenous change on the left fifth toe is stable and right foot is stable.  Has not progressed.  Recommended continuing wound care management. Appears to have some neuropathic pain, discussed with  patient about starting on Neurontin.  Patient stated in the past she was started on that and it did help. We will start on low dose 3 times daily and increase as tolerated slowly   Medium vessel vasculitis consistent with polyarthritis  No improvement with prednisone, methotrexate, Cytoxan Now with worsening ulcerative and ischemic lesions in lower extremity Had prior  connective tissue disease with uveitis, inflammatory arthritis.  Status post methotrexate and prednisone 10/17-rheumatology following.  Had a long discussion with Dr. Jefm Bryant who recommends once patient's possible superinfection improves should receive IVIG prior to being discharged home since she has failed multiple medical regimen.   Likely will continue on IV antibiotics for couple more days and afterwards can give 2 doses of 1 g/kilogram of body weight IVIG IV per IVIG protocol.  Repeated the next day to complete 2-day course prior to discharge We will continue on IV steroid and cyclophosphamide dose (which was increased from 5 mg to 10 mg We will need to discuss with rheumatology as to when to switch to p.o. steroid and at what dose(likely will need low dose for dc) Needs to f/u with vasculitits clinic as outpt at [facility] , not yet been there .     Nicotine dependence Was counseled on cessation Continue nicotine patch  Hypertension Controlled Continue amlodipine, increase if elevates      DVT prophylaxis: Lovenox Code Status: Full Family Communication: None at bedside  Status is: Inpatient  Remains inpatient appropriate because:IV treatments appropriate due to intensity of illness or inability to take PO   Dispo: The patient is from: Home              Anticipated d/c is to: Home              Anticipated d/c date is: 3 days              Patient currently is not medically stable to d/c.            LOS: 3 days   Time spent: 35 minutes with >50% on coc    Nolberto Hanlon, MD [facility] Pager [phone]  If 7PM-7AM, please contact night-coverage [email or web address] Password [email or web address] 07/20/2020, 11:39 AM

## 2020-07-21 DIAGNOSIS — Z720 Tobacco use: Secondary | ICD-10-CM

## 2020-07-21 LAB — CREATININE, SERUM
Creatinine, Ser: 0.72 mg/dL (ref 0.44–1.00)
GFR, Estimated: >60 mL/min (ref >60)

## 2020-07-21 LAB — C-REACTIVE PROTEIN: CRP: 3.1 mg/dL — ABNORMAL HIGH (ref <1.0)

## 2020-07-21 MED ORDER — GABAPENTIN 100 MG PO CAPS
200.0000 mg | ORAL_CAPSULE | Freq: Three times a day (TID) | ORAL | Status: DC
  Administered 2020-07-21 – 2020-07-22 (×3): 200 mg via ORAL
  Filled 2020-07-21 (×3): qty 2

## 2020-07-21 MED ORDER — AMLODIPINE BESYLATE 5 MG PO TABS
5.0000 mg | ORAL_TABLET | Freq: Every day | ORAL | Status: DC
  Administered 2020-07-22 – 2020-07-25 (×4): 5 mg via ORAL
  Filled 2020-07-21 (×4): qty 1

## 2020-07-21 MED ORDER — AMLODIPINE BESYLATE 5 MG PO TABS
2.5000 mg | ORAL_TABLET | Freq: Once | ORAL | Status: AC
  Administered 2020-07-21: 2.5 mg via ORAL
  Filled 2020-07-21: qty 1

## 2020-07-21 NOTE — Plan of Care (Signed)
  Problem: Education: Goal: Knowledge of General Education information will improve Description Including pain rating scale, medication(s)/side effects and non-pharmacologic comfort measures Outcome: Progressing   Problem: Clinical Measurements: Goal: Ability to maintain clinical measurements within normal limits will improve Outcome: Progressing Goal: Will remain free from infection Outcome: Progressing Goal: Diagnostic test results will improve Outcome: Progressing Goal: Respiratory complications will improve Outcome: Progressing Goal: Cardiovascular complication will be avoided Outcome: Progressing   Problem: Elimination: Goal: Will not experience complications related to bowel motility Outcome: Progressing Goal: Will not experience complications related to urinary retention Outcome: Progressing   Problem: Pain Managment: Goal: General experience of comfort will improve Outcome: Progressing   Problem: Safety: Goal: Ability to remain free from injury will improve Outcome: Progressing   Problem: Skin Integrity: Goal: Risk for impaired skin integrity will decrease Outcome: Progressing   

## 2020-07-21 NOTE — Progress Notes (Signed)
PROGRESS NOTE    Regina Carpenter  IRS:[id number] DOB: 1976/04/21 DOA: 07/17/2020 PCP: Emmaline Kluver., MD    Brief Narrative:  Regina Carpenter is a 44 y.o. female with medical history significant for rheumatoid arthritis, polyarteritis vasculitis, chronic bilateral foot wounds who presents to the emergency department for evaluation of worsening pain in her left lower extremity. With leg ulcers due to her vasculitis.On iv vanco and Rocephin.       Consultants:   Rheumatology   Wound care nursing  ID  Podiatry  Procedures:   Antimicrobials:   Rocephin and vancomycin    Subjective: Pt has lots of questions about her gangrene foot, stating cannot walk with it b/c its hard. Would like to know why she is not getting surgery on it.  Also with pain still. States neurontin helps some  Objective: Vitals:   07/20/20 2110 07/20/20 2346 07/21/20 0426 07/21/20 0750  BP: (!) 126/93 129/80 (!) 144/101 (!) 138/91  Pulse: 86 80 75 80  Resp: 16 16 18 20   Temp: 98.6 F (37 C) 98.5 F (36.9 C) 98.8 F (37.1 C) 98.3 F (36.8 C)  TempSrc: Oral Oral Oral Oral  SpO2: 98% 99% 100% 99%  Weight:      Height:        Intake/Output Summary (Last 24 hours) at 07/21/2020 0805 Last data filed at 07/20/2020 1905 Gross per 24 hour  Intake 120 ml  Output --  Net 120 ml   Filed Weights   07/17/20 0308  Weight: 93.4 kg    Examination: NAD,, comfortable sitting up in bed  CTA no wheeze rales rhonchi  RRR S1-S2, no murmurs rubs gallops  Soft benign, nontender positive bowel sounds  Left foot ulcers with less drainage  Alert oriented x3, grossly intact  Mood and affect is appropriate      Data Reviewed: I have personally reviewed following labs and imaging studies  CBC: Recent Labs  Lab 07/17/20 0313 07/18/20 0638 07/19/20 0404 07/20/20 0528  WBC 20.9* 21.3* 21.2* 12.9*  NEUTROABS 14.9*  --   --   --   HGB 10.7* 9.7* 9.2* 8.9*  HCT 36.1 33.4* 31.4* 30.5*  MCV  77.6* 78.0* 77.5* 78.4*  PLT 303 285 285 009   Basic Metabolic Panel: Recent Labs  Lab 07/17/20 0313 07/17/20 0313 07/17/20 1450 07/18/20 0638 07/19/20 0404 07/20/20 0528 07/21/20 0342  NA 138  --  137  --  137  --   --   K 3.6  --  3.7  --  4.0  --   --   CL 101  --  102  --  105  --   --   CO2 26  --  24  --  25  --   --   GLUCOSE 88  --  90  --  100*  --   --   BUN 8  --  6  --  11  --   --   CREATININE 0.75   < > 0.81 0.72 0.59 0.68 0.72  CALCIUM 9.1  --  8.8*  --  8.9  --   --    < > = values in this interval not displayed.   GFR: Estimated Creatinine Clearance: 100.4 mL/min (by C-G formula based on SCr of 0.72 mg/dL). Liver Function Tests: Recent Labs  Lab 07/17/20 0313  AST 14*  ALT 11  ALKPHOS 67  BILITOT 0.6  PROT 7.8  ALBUMIN 3.7   No results for input(s):  LIPASE, AMYLASE in the last 168 hours. No results for input(s): AMMONIA in the last 168 hours. Coagulation Profile: Recent Labs  Lab 07/18/20 0638  INR 1.1   Cardiac Enzymes: No results for input(s): CKTOTAL, CKMB, CKMBINDEX, TROPONINI in the last 168 hours. BNP (last 3 results) No results for input(s): PROBNP in the last 8760 hours. HbA1C: No results for input(s): HGBA1C in the last 72 hours. CBG: No results for input(s): GLUCAP in the last 168 hours. Lipid Profile: No results for input(s): CHOL, HDL, LDLCALC, TRIG, CHOLHDL, LDLDIRECT in the last 72 hours. Thyroid Function Tests: No results for input(s): TSH, T4TOTAL, FREET4, T3FREE, THYROIDAB in the last 72 hours. Anemia Panel: No results for input(s): VITAMINB12, FOLATE, FERRITIN, TIBC, IRON, RETICCTPCT in the last 72 hours. Sepsis Labs: Recent Labs  Lab 07/17/20 0313 07/17/20 0530 07/17/20 0815 07/18/20 1950  PROCALCITON  --   --   --  0.11  LATICACIDVEN 2.3* 3.1* 1.7  --     Recent Results (from the past 240 hour(s))  Blood Culture (routine x 2)     Status: None (Preliminary result)   Collection Time: 07/17/20  5:29 AM    Specimen: BLOOD  Result Value Ref Range Status   Specimen Description BLOOD LEFT [facility]  Final   Special Requests   Final    BOTTLES DRAWN AEROBIC AND ANAEROBIC Blood Culture adequate volume   Culture   Final    NO GROWTH 4 DAYS Performed at [facility], [address] [place], [place] [address]    Report Status PENDING  Incomplete  Blood Culture (routine x 2)     Status: None (Preliminary result)   Collection Time: 07/17/20  5:30 AM   Specimen: BLOOD  Result Value Ref Range Status   Specimen Description BLOOD RIGHT [facility]  Final   Special Requests   Final    BOTTLES DRAWN AEROBIC AND ANAEROBIC Blood Culture adequate volume   Culture   Final    NO GROWTH 4 DAYS Performed at [facility] [address] [place], [place] [address]    Report Status PENDING  Incomplete  Respiratory Panel by RT PCR (Flu A&B, Covid) - Nasopharyngeal Swab     Status: None   Collection Time: 07/17/20  8:30 AM   Specimen: Nasopharyngeal Swab  Result Value Ref Range Status   SARS Coronavirus 2 by RT PCR NEGATIVE NEGATIVE Final    Comment: (NOTE) SARS-CoV-2 target nucleic acids are NOT DETECTED.  The SARS-CoV-2 RNA is generally detectable in upper respiratoy specimens during the acute phase of infection. The lowest concentration of SARS-CoV-2 viral copies this assay can detect is 131 copies/mL. A negative result does not preclude SARS-Cov-2 infection and should not be used as the sole basis for treatment or other patient management decisions. A negative result may occur with  improper specimen collection/handling, submission of specimen other than nasopharyngeal swab, presence of viral mutation(s) within the areas targeted by this assay, and inadequate number of viral copies (<131 copies/mL). A negative result must be combined with clinical observations, patient history, and epidemiological information. The expected result is Negative.  Fact Sheet for Patients:    PinkCheek.be  Fact Sheet for Healthcare Providers:  GravelBags.it  This test is no t yet approved or cleared by the [place] FDA and  has been authorized for detection and/or diagnosis of SARS-CoV-2 by FDA under an Emergency Use Authorization (EUA). This EUA will remain  in effect (meaning this test can be used) for the duration  of the COVID-19 declaration under Section 564(b)(1) of the Act, 21 U.S.C. section 360bbb-3(b)(1), unless the authorization is terminated or revoked sooner.     Influenza A by PCR NEGATIVE NEGATIVE Final   Influenza B by PCR NEGATIVE NEGATIVE Final    Comment: (NOTE) The Xpert Xpress SARS-CoV-2/FLU/RSV assay is intended as an aid in  the diagnosis of influenza from Nasopharyngeal swab specimens and  should not be used as a sole basis for treatment. Nasal washings and  aspirates are unacceptable for Xpert Xpress SARS-CoV-2/FLU/RSV  testing.  Fact Sheet for Patients: PinkCheek.be  Fact Sheet for Healthcare Providers: GravelBags.it  This test is not yet approved or cleared by the [place] FDA and  has been authorized for detection and/or diagnosis of SARS-CoV-2 by  FDA under an Emergency Use Authorization (EUA). This EUA will remain  in effect (meaning this test can be used) for the duration of the  Covid-19 declaration under Section 564(b)(1) of the Act, 21  U.S.C. section 360bbb-3(b)(1), unless the authorization is  terminated or revoked. Performed at [facility], [address] [place], [place] [address]   MRSA PCR Screening     Status: None   Collection Time: 07/18/20 12:30 AM   Specimen: Nasopharyngeal  Result Value Ref Range Status   MRSA by PCR NEGATIVE NEGATIVE Final    Comment:        The GeneXpert MRSA Assay (FDA approved for NASAL specimens only), is one component of a comprehensive MRSA  colonization surveillance program. It is not intended to diagnose MRSA infection nor to guide or monitor treatment for MRSA infections. Performed at [facility], [address] [place], [place] [address]          Radiology Studies: No results found.      Scheduled Meds:  amLODipine  2.5 mg Oral Daily   collagenase   Topical Daily   cyclophosphamide  100 mg Oral Daily   enoxaparin (LOVENOX) injection  0.5 mg/kg Subcutaneous Q24H   famotidine  20 mg Oral Daily   gabapentin  100 mg Oral TID   methylPREDNISolone (SOLU-MEDROL) injection  40 mg Intravenous Q12H   nicotine  14 mg Transdermal Daily   sodium chloride flush  10-40 mL Intracatheter Q12H   Continuous Infusions:  cefTRIAXone (ROCEPHIN)  IV 1 g (07/20/20 1520)   vancomycin Stopped (07/21/20 0300)    Assessment & Plan:   Principal Problem:   Sepsis ([place]) Active Problems:   Tobacco use   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)   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[facility])   Essential hypertension   Bilateral lower leg cellulitis  Sepsis Present on admission and evidenced by low-grade fever with a T-max of 99, tachycardia, marked leukocytosis with a left shift, bilateral lower extremity ulcers with purulent drainage and elevated lactic acid level present on admission -10/18-leukocytosis improving, CRP down ID following-high recommended vancomycin and ceftriaxone over the weekend and improving transition to oral Keflex to complete 10-day course We will continue IV vancomycin and ceftriaxone for now as patient needs to be cleared from infection since she will be receiving IV Ig during this hospitalization Blood cultures negative to date   Ischemic ulcer of toes on left foot Patient with a history of systemic vasculitis admitted to the hospital for evaluation of ischemic ulcers involving the second and left fifth toe She is status post recent transmetatarsal amputation of the right foot Wound care  following recommended enzymatic debridement please see note for wound care dressing instructions  She will need to follow-up with vascular at clinic at [facility] as outpatient  Podiatry following and did not recommend patient needing any amputation as gangrenous change on the left fifth toe is stable and right foot is stable.  Has not progressed.  Recommended continuing wound management.  However patient has multiple questions today and I have asked Dr. Daleen Squibb to see patient today.   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  Status post methotrexate and prednisone 10/18-Rheumatology following. Plan for IVIG during this hospitalization prior to dc home with [facility].  IVIG when infection is fully cleared She will need to be started on 1 g/kilogram of body weight of IVIG IV per protocol Repeat the next day to complete 2-day course prior to discharge For now we will continue IV steroid Unfortunately pharmacy does not carry cyclophosphamide dose spoke about this with rheumatology Dr. Jefm Bryant and he was fine with patient being off of it for no    Nicotine dependence Was counseled on cessation  Continue nicotine patch    Hypertension Has room for improvement, will increase amlodipine to 5 mg daily       DVT prophylaxis: Lovenox Code Status: Full Family Communication: None at bedside  Status is: Inpatient  Remains inpatient appropriate because:IV treatments appropriate due to intensity of illness or inability to take PO   Dispo: The patient is from: Home              Anticipated d/c is to: Home              Anticipated d/c date is: 3  days              Patient currently is not medically stable to d/c.need to complete iv dose for another 2 days, then needs ivig x2 days prior to dc .            LOS: 4 days   Time spent: 35 minutes with >50% on coc    Nolberto Hanlon, MD [facility] Pager [phone]  If 7PM-7AM, please contact night-coverage [email or web address] Password [email or web address] 07/21/2020, 8:05 AM

## 2020-07-21 NOTE — Progress Notes (Signed)
Subjective/Chief Complaint: Patient seen at the request of Dr. Kurtis Bushman as she has some questions regarding the gangrenous toes on her left foot.  Still having a lot of pain with these.  States she had been bandaging the left second toe at home but it felt like this was starting to dig into the toe.   Objective: Vital signs in last 24 hours: Temp:  [97.9 F (36.6 C)-98.8 F (37.1 C)] 98 F (36.7 C) (10/18 1041) Pulse Rate:  [75-86] 80 (10/18 1041) Resp:  [16-20] 17 (10/18 1041) BP: (126-144)/(80-101) 137/89 (10/18 1041) SpO2:  [98 %-100 %] 100 % (10/18 1041) Last BM Date:  (07/20/20)  Intake/Output from previous day: 10/17 0701 - 10/18 0700 In: 120 [P.O.:120] Out: -  Intake/Output this shift: Total I/O In: 30 [P.O.:20; I.V.:10] Out: -   Dry gangrenous changes on the left fifth toe are stable and basically unchanged.  Stable eschar which appears to be full-thickness along the lateral aspect of the left second toe.  No significant cellulitis or drainage.  Lab Results:  Recent Labs    07/19/20 0404 07/20/20 0528  WBC 21.2* 12.9*  HGB 9.2* 8.9*  HCT 31.4* 30.5*  PLT 285 299   BMET Recent Labs    07/19/20 0404 07/19/20 0404 07/20/20 0528 07/21/20 0342  NA 137  --   --   --   K 4.0  --   --   --   CL 105  --   --   --   CO2 25  --   --   --   GLUCOSE 100*  --   --   --   BUN 11  --   --   --   CREATININE 0.59   < > 0.68 0.72  CALCIUM 8.9  --   --   --    < > = values in this interval not displayed.   PT/INR No results for input(s): LABPROT, INR in the last 72 hours. ABG No results for input(s): PHART, HCO3 in the last 72 hours.  Invalid input(s): PCO2, PO2  Studies/Results: No results found.  Anti-infectives: Anti-infectives (From admission, onward)   Start     Dose/Rate Route Frequency Ordered Stop   07/19/20 1100  vancomycin (VANCOREADY) IVPB 1250 mg/250 mL        1,250 mg 166.7 mL/hr over 90 Minutes Intravenous Every 12 hours 07/19/20 0941      07/19/20 0500  vancomycin (VANCOREADY) IVPB 1250 mg/250 mL  Status:  Discontinued        1,250 mg 166.7 mL/hr over 90 Minutes Intravenous Every 12 hours 07/18/20 1607 07/19/20 0941   07/17/20 2200  vancomycin (VANCOREADY) IVPB 750 mg/150 mL  Status:  Discontinued        750 mg 150 mL/hr over 60 Minutes Intravenous Every 12 hours 07/17/20 1511 07/18/20 1607   07/17/20 1600  cefTRIAXone (ROCEPHIN) 1 g in sodium chloride 0.9 % 100 mL IVPB        1 g 200 mL/hr over 30 Minutes Intravenous Every 24 hours 07/17/20 1504     07/17/20 0930  vancomycin (VANCOCIN) IVPB 1000 mg/200 mL premix        1,000 mg 200 mL/hr over 60 Minutes Intravenous  Once 07/17/20 0916 07/17/20 1100   07/17/20 0515  ceFEPIme (MAXIPIME) 2 g in sodium chloride 0.9 % 100 mL IVPB        2 g 200 mL/hr over 30 Minutes Intravenous  Once 07/17/20 0506 07/17/20 4944  07/17/20 0515  metroNIDAZOLE (FLAGYL) IVPB 500 mg        500 mg 100 mL/hr over 60 Minutes Intravenous  Once 07/17/20 0506 07/17/20 0738   07/17/20 0515  vancomycin (VANCOCIN) IVPB 1000 mg/200 mL premix        1,000 mg 200 mL/hr over 60 Minutes Intravenous  Once 07/17/20 0506 07/17/20 1017      Assessment/Plan: s/p * No surgery found * Assessment: Stable gangrenous changes left second and fifth toes with chronic severe vasculitis.   Plan: Discussed with the patient that at some point I do still believe that she will at least need amputation of the second and fifth toes.  Discussed with her that at this point since they are stable I would tend to just continue with conservative care as if we did try to amputate just the toes that with her circulation status there is a chance that these would not heal and we could make the situation worse leading to higher amputations.  Patient getting ready to start IVIG treatment and still it is recommended that we need to get her in with the vasculitis clinic down at [facility].  She states full understanding of this.  Hopefully they can  initiate some type of treatment that helps with the vasculitis and gives her a better chance for healing when amputation of the toes as needed.  At this point podiatry will sign off.  LOS: 4 days    [name] 07/21/2020

## 2020-07-22 DIAGNOSIS — L97921 Non-pressure chronic ulcer of unspecified part of left lower leg limited to breakdown of skin: Secondary | ICD-10-CM

## 2020-07-22 DIAGNOSIS — I10 Essential (primary) hypertension: Secondary | ICD-10-CM | POA: Diagnosis not present

## 2020-07-22 DIAGNOSIS — M318 Other specified necrotizing vasculopathies: Secondary | ICD-10-CM

## 2020-07-22 LAB — CULTURE, BLOOD (ROUTINE X 2)
Culture: NO GROWTH
Culture: NO GROWTH
Special Requests: ADEQUATE
Special Requests: ADEQUATE

## 2020-07-22 LAB — BASIC METABOLIC PANEL
Anion gap: 11 (ref 5–15)
BUN: 16 mg/dL (ref 6–20)
CO2: 24 mmol/L (ref 22–32)
Calcium: 9.1 mg/dL (ref 8.9–10.3)
Chloride: 103 mmol/L (ref 98–111)
Creatinine, Ser: 0.83 mg/dL (ref 0.44–1.00)
GFR, Estimated: >60 mL/min (ref >60)
Glucose, Bld: 102 mg/dL — ABNORMAL HIGH (ref 70–99)
Potassium: 3.8 mmol/L (ref 3.5–5.1)
Sodium: 138 mmol/L (ref 135–145)

## 2020-07-22 LAB — CBC
HCT: 34.6 % — ABNORMAL LOW (ref 36.0–46.0)
Hemoglobin: 10.1 g/dL — ABNORMAL LOW (ref 12.0–15.0)
MCH: 22.7 pg — ABNORMAL LOW (ref 26.0–34.0)
MCHC: 29.2 g/dL — ABNORMAL LOW (ref 30.0–36.0)
MCV: 77.9 fL — ABNORMAL LOW (ref 80.0–100.0)
Platelets: 355 10*3/uL (ref 150–400)
RBC: 4.44 MIL/uL (ref 3.87–5.11)
RDW: 18.1 % — ABNORMAL HIGH (ref 11.5–15.5)
WBC: 13.5 10*3/uL — ABNORMAL HIGH (ref 4.0–10.5)
nRBC: 0 % (ref 0.0–0.2)

## 2020-07-22 LAB — VANCOMYCIN, TROUGH: Vancomycin Tr: 7 ug/mL — ABNORMAL LOW (ref 15–20)

## 2020-07-22 MED ORDER — VANCOMYCIN HCL 1250 MG/250ML IV SOLN
1250.0000 mg | Freq: Three times a day (TID) | INTRAVENOUS | Status: DC
  Filled 2020-07-22 (×2): qty 250

## 2020-07-22 MED ORDER — GABAPENTIN 300 MG PO CAPS
300.0000 mg | ORAL_CAPSULE | Freq: Three times a day (TID) | ORAL | Status: DC
  Administered 2020-07-22 – 2020-07-25 (×9): 300 mg via ORAL
  Filled 2020-07-22 (×9): qty 1

## 2020-07-22 MED ORDER — VANCOMYCIN HCL 1500 MG/300ML IV SOLN
1500.0000 mg | Freq: Two times a day (BID) | INTRAVENOUS | Status: DC
  Administered 2020-07-22 – 2020-07-23 (×3): 1500 mg via INTRAVENOUS
  Filled 2020-07-22 (×4): qty 300

## 2020-07-22 NOTE — Progress Notes (Addendum)
PROGRESS NOTE    Regina Carpenter  YHC:[id number] DOB: [birth year] DOA: 07/17/2020 PCP: Emmaline Kluver., MD    Brief Narrative:  Regina Carpenter is a 44 y.o. female with medical history significant for rheumatoid arthritis, polyarteritis vasculitis, chronic bilateral foot wounds who presents to the emergency department for evaluation of worsening pain in her left lower extremity. With leg ulcers due to her vasculitis.On iv vanco and Rocephin.       Consultants:   Rheumatology   Wound care nursing  ID  Podiatry  Procedures:   Antimicrobials:   Rocephin and vancomycin    Subjective: Still with pain. Feels neurontin is helping some. No other complaints.   Objective: Vitals:   07/21/20 1611 07/21/20 1933 07/22/20 0022 07/22/20 0427  BP: (!) 145/96 139/89 (!) 143/97 (!) 130/99  Pulse: 77 83 74 81  Resp: 19 18 18 18   Temp: 98.1 F (36.7 C) 98.5 F (36.9 C) 97.9 F (36.6 C) 98.3 F (36.8 C)  TempSrc: Oral Oral Oral Oral  SpO2: 100% 99% 100% 97%  Weight:      Height:        Intake/Output Summary (Last 24 hours) at 07/22/2020 1109 Last data filed at 07/22/2020 0900 Gross per 24 hour  Intake 590 ml  Output --  Net 590 ml   Filed Weights   07/17/20 0308  Weight: 93.4 kg    Examination: Sitting up in bed, comfortable and calm  CTA, no wheeze rales rhonchi  RRR S1-S2 no murmurs  Abdomen soft benign positive bowel sounds nontender  Left foot ulcers few dressings that I checked has some drainage but it might be from the cream she is applying  Alert oriented x3 grossly intact  Mood and affect is appropriate in current setting     Data Reviewed: I have personally reviewed following labs and imaging studies  CBC: Recent Labs  Lab 07/17/20 0313 07/18/20 0638 07/19/20 0404 07/20/20 0528 07/22/20 0413  WBC 20.9* 21.3* 21.2* 12.9* 13.5*  NEUTROABS 14.9*  --   --   --   --   HGB 10.7* 9.7* 9.2* 8.9* 10.1*  HCT 36.1 33.4* 31.4* 30.5* 34.6*  MCV  77.6* 78.0* 77.5* 78.4* 77.9*  PLT 303 285 285 299 517   Basic Metabolic Panel: Recent Labs  Lab 07/17/20 0313 07/17/20 0313 07/17/20 1450 07/17/20 1450 07/18/20 6160 07/19/20 0404 07/20/20 0528 07/21/20 0342 07/22/20 0413  NA 138  --  137  --   --  137  --   --  138  K 3.6  --  3.7  --   --  4.0  --   --  3.8  CL 101  --  102  --   --  105  --   --  103  CO2 26  --  24  --   --  25  --   --  24  GLUCOSE 88  --  90  --   --  100*  --   --  102*  BUN 8  --  6  --   --  11  --   --  16  CREATININE 0.75   < > 0.81   < > 0.72 0.59 0.68 0.72 0.83  CALCIUM 9.1  --  8.8*  --   --  8.9  --   --  9.1   < > = values in this interval not displayed.   GFR: Estimated Creatinine Clearance: 96.7 mL/min (by C-G formula based  on SCr of 0.83 mg/dL). Liver Function Tests: Recent Labs  Lab 07/17/20 0313  AST 14*  ALT 11  ALKPHOS 67  BILITOT 0.6  PROT 7.8  ALBUMIN 3.7   No results for input(s): LIPASE, AMYLASE in the last 168 hours. No results for input(s): AMMONIA in the last 168 hours. Coagulation Profile: Recent Labs  Lab 07/18/20 0638  INR 1.1   Cardiac Enzymes: No results for input(s): CKTOTAL, CKMB, CKMBINDEX, TROPONINI in the last 168 hours. BNP (last 3 results) No results for input(s): PROBNP in the last 8760 hours. HbA1C: No results for input(s): HGBA1C in the last 72 hours. CBG: No results for input(s): GLUCAP in the last 168 hours. Lipid Profile: No results for input(s): CHOL, HDL, LDLCALC, TRIG, CHOLHDL, LDLDIRECT in the last 72 hours. Thyroid Function Tests: No results for input(s): TSH, T4TOTAL, FREET4, T3FREE, THYROIDAB in the last 72 hours. Anemia Panel: No results for input(s): VITAMINB12, FOLATE, FERRITIN, TIBC, IRON, RETICCTPCT in the last 72 hours. Sepsis Labs: Recent Labs  Lab 07/17/20 0313 07/17/20 0530 07/17/20 0815 07/18/20 5852  PROCALCITON  --   --   --  0.11  LATICACIDVEN 2.3* 3.1* 1.7  --     Recent Results (from the past 240 hour(s))    Blood Culture (routine x 2)     Status: None   Collection Time: 07/17/20  5:29 AM   Specimen: BLOOD  Result Value Ref Range Status   Specimen Description BLOOD LEFT [facility]  Final   Special Requests   Final    BOTTLES DRAWN AEROBIC AND ANAEROBIC Blood Culture adequate volume   Culture   Final    NO GROWTH 5 DAYS Performed at [facility], [address], [place], [place] [address]    Report Status 07/22/2020 FINAL  Final  Blood Culture (routine x 2)     Status: None   Collection Time: 07/17/20  5:30 AM   Specimen: BLOOD  Result Value Ref Range Status   Specimen Description BLOOD RIGHT [facility]  Final   Special Requests   Final    BOTTLES DRAWN AEROBIC AND ANAEROBIC Blood Culture adequate volume   Culture   Final    NO GROWTH 5 DAYS Performed at [facility], [address] [place], [place] [address]    Report Status 07/22/2020 FINAL  Final  Respiratory Panel by RT PCR (Flu A&B, Covid) - Nasopharyngeal Swab     Status: None   Collection Time: 07/17/20  8:30 AM   Specimen: Nasopharyngeal Swab  Result Value Ref Range Status   SARS Coronavirus 2 by RT PCR NEGATIVE NEGATIVE Final    Comment: (NOTE) SARS-CoV-2 target nucleic acids are NOT DETECTED.  The SARS-CoV-2 RNA is generally detectable in upper respiratoy specimens during the acute phase of infection. The lowest concentration of SARS-CoV-2 viral copies this assay can detect is 131 copies/mL. A negative result does not preclude SARS-Cov-2 infection and should not be used as the sole basis for treatment or other patient management decisions. A negative result may occur with  improper specimen collection/handling, submission of specimen other than nasopharyngeal swab, presence of viral mutation(s) within the areas targeted by this assay, and inadequate number of viral copies (<131 copies/mL). A negative result must be combined with clinical observations, patient history, and epidemiological information.  The expected result is Negative.  Fact Sheet for Patients:  PinkCheek.be  Fact Sheet for Healthcare Providers:  GravelBags.it  This test is no t yet approved or cleared by the [place]  FDA and  has been authorized for detection and/or diagnosis of SARS-CoV-2 by FDA under an Emergency Use Authorization (EUA). This EUA will remain  in effect (meaning this test can be used) for the duration of the COVID-19 declaration under Section 564(b)(1) of the Act, 21 U.S.C. section 360bbb-3(b)(1), unless the authorization is terminated or revoked sooner.     Influenza A by PCR NEGATIVE NEGATIVE Final   Influenza B by PCR NEGATIVE NEGATIVE Final    Comment: (NOTE) The Xpert Xpress SARS-CoV-2/FLU/RSV assay is intended as an aid in  the diagnosis of influenza from Nasopharyngeal swab specimens and  should not be used as a sole basis for treatment. Nasal washings and  aspirates are unacceptable for Xpert Xpress SARS-CoV-2/FLU/RSV  testing.  Fact Sheet for Patients: PinkCheek.be  Fact Sheet for Healthcare Providers: GravelBags.it  This test is not yet approved or cleared by the [place] FDA and  has been authorized for detection and/or diagnosis of SARS-CoV-2 by  FDA under an Emergency Use Authorization (EUA). This EUA will remain  in effect (meaning this test can be used) for the duration of the  Covid-19 declaration under Section 564(b)(1) of the Act, 21  U.S.C. section 360bbb-3(b)(1), unless the authorization is  terminated or revoked. Performed at [facility], [address] [place], [place] [address]   MRSA PCR Screening     Status: None   Collection Time: 07/18/20 12:30 AM   Specimen: Nasopharyngeal  Result Value Ref Range Status   MRSA by PCR NEGATIVE NEGATIVE Final    Comment:        The GeneXpert MRSA Assay (FDA approved for NASAL  specimens only), is one component of a comprehensive MRSA colonization surveillance program. It is not intended to diagnose MRSA infection nor to guide or monitor treatment for MRSA infections. Performed at [facility], [address], [place], [place] [address]          Radiology Studies: No results found.      Scheduled Meds: . amLODipine  5 mg Oral Daily  . collagenase   Topical Daily  . cyclophosphamide  100 mg Oral Daily  . enoxaparin (LOVENOX) injection  0.5 mg/kg Subcutaneous Q24H  . famotidine  20 mg Oral Daily  . gabapentin  200 mg Oral TID  . methylPREDNISolone (SOLU-MEDROL) injection  40 mg Intravenous Q12H  . nicotine  14 mg Transdermal Daily  . sodium chloride flush  10-40 mL Intracatheter Q12H   Continuous Infusions: . cefTRIAXone (ROCEPHIN)  IV 1 g (07/21/20 1700)  . 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-continue with wound care recommendation for enzymatic debridement please see note for wound care dressing instructions  Once she is stabilized and discharged she will need to follow-up with vascular clinic at [facility] as outpatient  We will increase Neurontin to 300 3 times daily as it is helping control some of her neuropathic pain Podiatry saw patient since she requested further explanation.They recommended continued conservative therapyas  Amputation of toes with her circulation there is a chance that would not heal and could make the situation worse leading to her amputations Podiatry felt the gangrenous changes on the left fifth toe is stable and the right fourth toe is also stable and has not progressed. 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  Status post methotrexate and prednisone 10/19-rheumatology following, pt found with microscopic hematuria. Will need to be setup with nephrology as outpt for further w/u  Rheumatology recommends IVIG when infection is cleared prior to her discharge. She should get this here so there are no delays as outpt as she has failed other tx, this is last hope, which will stablize hopefully until she is seen at [facility] clinic. She will need to start 1 g/kg of body weight of IVIG IV per protocol and repeat the next day to complete 2-day course prior to discharge Continue with IV steroids and eventually go on to low-dose taper, touch base with rheumatology for this Patient did not receive cyclophosphamide as it is not carried in our pharmacy and Dr. Jefm Bryant was okay with not giving her this for now.   Nicotine dependence Was  counseled on cessation  Continue nicotine patch   Hypertension bp improving, pain likely causes some elevation Monitor and if need to will increase amlodipine to 10mg  qd   GI PPX- on famotidine     DVT prophylaxis: Lovenox Code Status: Full Family Communication: None at bedside  Status is: Inpatient  Remains inpatient appropriate because:IV treatments appropriate due to intensity of illness or inability to take PO   Dispo: The patient is from: Home              Anticipated d/c is to: Home              Anticipated d/c date is: 3 days              Patient currently is not medically stable to d/c.needs to complete abx, and receive IVIG prior to dc. Pain still needs better control.             LOS: 5 days   Time spent: 35 minutes with >50% on coc    Nolberto Hanlon, MD [facility] Pager [phone]  If 7PM-7AM, please contact night-coverage [email or web address] Password [email or web address] 07/22/2020, 11:09 AM

## 2020-07-22 NOTE — Consult Note (Signed)
Pharmacy Antibiotic Note  Regina Carpenter is a 44 y.o. 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  Pharmacy has been consulted for vancomycin dosing. Immunosuppression has been continued and steroid dosing escalated given suspected progression of connective tissue disease.   Today, 07/22/20  Day # 6 antibiotics --WBC 13.5 -- patient on corticosteroids for polyarteritis nodosa --SCr largely stable --Vancomycin trough drawn last night = 7 mcg/mL (lab misrecorded as being drawn 10/18 at 00:04 but actually drawn  10/19 (lab ot correct)  Plan:   Adjust vancomycin to 1500mg  IV q12h for estimated trough = 10 mcg/ml (goal 10-15 mcg/mL)  No need for trough 10/20  Monitor renal function  Follow length of therapy    Height: 5\' 5"  (165.1 cm) Weight: 93.4 kg (206 lb) IBW/kg (Calculated) : 57  Temp (24hrs), Avg:98.2 F (36.8 C), Min:97.9 F (36.6 C), Max:98.5 F (36.9 C)  Recent Labs  Lab 07/17/20 0313 07/17/20 0530 07/17/20 0815 07/17/20 1450 07/18/20 8563 07/19/20 0404 07/20/20 0528 07/21/20 0004 07/21/20 0342 07/22/20 0413  WBC 20.9*  --   --   --  21.3* 21.2* 12.9*  --   --  13.5*  CREATININE 0.75  --   --    < > 0.72 0.59 0.68  --  0.72 0.83  LATICACIDVEN 2.3* 3.1* 1.7  --   --   --   --   --   --   --   VANCOTROUGH  --   --   --   --   --   --   --  7*  --   --    < > = values in this interval not displayed.    Estimated Creatinine Clearance: 96.7 mL/min (by C-G formula based on SCr of 0.83 mg/dL).    Antimicrobials this admission: ceftriaxone 10/14 >>  vancomycin 10/14 >>   Microbiology results: 10/14 BCx: NGTD 10/14 SARS CoV-2: negative  10/14 influenza A/B: negative 10/15 MRSA PCR negative  Thank you for allowing pharmacy to be a part of this patient's care.  Doreene Eland, PharmD, BCPS.   Work Cell: [phone] 07/22/2020 8:23 AM

## 2020-07-22 NOTE — Consult Note (Signed)
Pharmacy Antibiotic Note  Regina Carpenter is a 44 y.o. 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  Pharmacy has been consulted for vancomycin dosing. Immunosuppression has been continued and steroid dosing escalated given suspected progression of connective tissue disease.   Today, 07/22/20  --WBC 20.9 > 21.3 > 21.2; PCT 0.11 on 10/15 --CRP trending up significantly --SCr largely stable  Plan:   Continue vancomycin 1250 mg IV Q 12 hrs  Daily Scr while on vancomycin to monitor renal function  Vancomycin levels as clinically indicated (goal trough 10-15 mcg/mL for cellulitis)  Call from RN saying that IV found to be infiltrated around 0800; the 0400 dose of vancomycin bag still 3/4 full. Will retime vancomycin 1250 mg IV q12h to restart at 1100 since not much of previous dose was given.   10/19:   Vanc trough drawn on 10/18 @ 0004 = 7 Previous vanc dose was hung on 10/17 @ 2242 so this value most likely is more of a peak than a trough.   Will increase dose to Vancomycin 1250 mg IV Q8H to start on 10/19 @ 1100. Will draw next vanc trough before 3rd new dose on 10/20 @ 0230.   Height: 5\' 5"  (165.1 cm) Weight: 93.4 kg (206 lb) IBW/kg (Calculated) : 57  Temp (24hrs), Avg:98.2 F (36.8 C), Min:97.9 F (36.6 C), Max:98.5 F (36.9 C)  Recent Labs  Lab 07/17/20 0313 07/17/20 0313 07/17/20 0530 07/17/20 0815 07/17/20 1450 07/18/20 9826 07/19/20 0404 07/20/20 0528 07/21/20 0004 07/21/20 0342  WBC 20.9*  --   --   --   --  21.3* 21.2* 12.9*  --   --   CREATININE 0.75   < >  --   --  0.81 0.72 0.59 0.68  --  0.72  LATICACIDVEN 2.3*  --  3.1* 1.7  --   --   --   --   --   --   VANCOTROUGH  --   --   --   --   --   --   --   --  7*  --    < > = values in this interval not displayed.    Estimated Creatinine Clearance: 100.4 mL/min (by C-G formula based on SCr of 0.72 mg/dL).     Antimicrobials this admission: ceftriaxone 10/14 >>  vancomycin 10/14 >>   Microbiology results: 10/14 BCx: NGTD 10/14 SARS CoV-2: negative  10/14 influenza A/B: negative 10/15 MRSA PCR negative  Thank you for allowing pharmacy to be a part of this patient's care.  Leva Baine D 07/22/2020 4:41 AM

## 2020-07-22 NOTE — Progress Notes (Signed)
   Date of Admission:  07/17/2020   T     Subjective: Pt says she is feeling better Pain less legs No fever  Medications:  . amLODipine  5 mg Oral Daily  . collagenase   Topical Daily  . cyclophosphamide  100 mg Oral Daily  . enoxaparin (LOVENOX) injection  0.5 mg/kg Subcutaneous Q24H  . famotidine  20 mg Oral Daily  . gabapentin  300 mg Oral TID  . methylPREDNISolone (SOLU-MEDROL) injection  40 mg Intravenous Q12H  . nicotine  14 mg Transdermal Daily  . sodium chloride flush  10-40 mL Intracatheter Q12H    Objective: Vital signs in last 24 hours: Temp:  [97.9 F (36.6 C)-98.5 F (36.9 C)] 98.2 F (36.8 C) (10/19 1237) Pulse Rate:  [74-83] 78 (10/19 1237) Resp:  [18-19] 18 (10/19 1237) BP: (130-145)/(89-99) 135/94 (10/19 1237) SpO2:  [97 %-100 %] 100 % (10/19 1237)  PHYSICAL EXAM:  General: Alert, cooperative, no distress, appears stated age.  Head: Normocephalic, without obvious abnormality, atraumatic. Eyes: Conjunctivae clear, anicteric sclerae. Pupils are equal ENT Nares normal. No drainage or sinus tenderness. Lips, mucosa, and tongue normal. No Thrush piercing on the lips Neck: Supple, symmetrical, no adenopathy, thyroid: non tender no carotid bruit and no JVD. Back: No CVA tenderness. Lungs: Clear to auscultation bilaterally. No Wheezing or Rhonchi. No rales. Heart: Regular rate and rhythm, no murmur, rub or gallop. Abdomen: Soft, non-tender,not distended. Bowel sounds normal. No masses Extremities: multiple punched out ulcers left > rt leg Rt TMA Dry gangrene of the left toes      Skin:as above Lymph: Cervical, supraclavicular normal. Neurologic: Grossly non-focal  Lab Results Recent Labs    07/20/20 0528 07/20/20 0528 07/21/20 0342 07/22/20 0413  WBC 12.9*  --   --  13.5*  HGB 8.9*  --   --  10.1*  HCT 30.5*  --   --  34.6*  NA  --   --   --  138  K  --   --   --  3.8  CL  --   --   --  103  CO2  --   --   --  24  BUN  --   --   --  16   CREATININE 0.68   < > 0.72 0.83   < > = values in this interval not displayed.   Liver Panel No results for input(s): PROT, ALBUMIN, AST, ALT, ALKPHOS, BILITOT, BILIDIR, IBILI in the last 72 hours. Sedimentation Rate No results for input(s): ESRSEDRATE in the last 72 hours. 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  Discussed the management with patient and care team

## 2020-07-23 DIAGNOSIS — L97529 Non-pressure chronic ulcer of other part of left foot with unspecified severity: Secondary | ICD-10-CM | POA: Diagnosis not present

## 2020-07-23 DIAGNOSIS — L97519 Non-pressure chronic ulcer of other part of right foot with unspecified severity: Secondary | ICD-10-CM | POA: Diagnosis not present

## 2020-07-23 DIAGNOSIS — M318 Other specified necrotizing vasculopathies: Secondary | ICD-10-CM | POA: Diagnosis not present

## 2020-07-23 DIAGNOSIS — I776 Arteritis, unspecified: Secondary | ICD-10-CM

## 2020-07-23 LAB — CBC
HCT: 35.1 % — ABNORMAL LOW (ref 36.0–46.0)
Hemoglobin: 10.3 g/dL — ABNORMAL LOW (ref 12.0–15.0)
MCH: 22.8 pg — ABNORMAL LOW (ref 26.0–34.0)
MCHC: 29.3 g/dL — ABNORMAL LOW (ref 30.0–36.0)
MCV: 77.7 fL — ABNORMAL LOW (ref 80.0–100.0)
Platelets: 358 10*3/uL (ref 150–400)
RBC: 4.52 MIL/uL (ref 3.87–5.11)
RDW: 18.1 % — ABNORMAL HIGH (ref 11.5–15.5)
WBC: 18 10*3/uL — ABNORMAL HIGH (ref 4.0–10.5)
nRBC: 0 % (ref 0.0–0.2)

## 2020-07-23 MED ORDER — IMMUNE GLOBULIN (HUMAN) 5 GM/50ML IV SOLN
95.0000 g | INTRAVENOUS | Status: AC
  Administered 2020-07-23 – 2020-07-24 (×2): 95 g via INTRAVENOUS
  Filled 2020-07-23 (×2): qty 100

## 2020-07-23 MED ORDER — PIPERACILLIN-TAZOBACTAM 3.375 G IVPB
3.3750 g | Freq: Three times a day (TID) | INTRAVENOUS | Status: DC
  Administered 2020-07-23 – 2020-07-25 (×6): 3.375 g via INTRAVENOUS
  Filled 2020-07-23 (×10): qty 50

## 2020-07-23 MED ORDER — CEFAZOLIN SODIUM-DEXTROSE 1-4 GM/50ML-% IV SOLN
1.0000 g | Freq: Three times a day (TID) | INTRAVENOUS | Status: DC
  Filled 2020-07-23 (×2): qty 50

## 2020-07-23 MED ORDER — PIPERACILLIN-TAZOBACTAM 3.375 G IVPB
3.3750 g | Freq: Three times a day (TID) | INTRAVENOUS | Status: DC
  Filled 2020-07-23 (×2): qty 50

## 2020-07-23 NOTE — Evaluation (Addendum)
Physical Therapy Evaluation & Discharge Patient Details Name: Regina Carpenter MRN: [id number] DOB: 1976/07/02 Today's Date: 07/23/2020   History of Present Illness  Pt is a 44 y/o F admitted on 07/17/20 with worsening pain in LLE with gangrene involving 2nd & 5th toes on L foot with plans to tx them with conservative care. PMH: RA, polyarteritis vasculitis, chronic B foot wounds, R transmet amputation on 05/20/20, HTN.    Clinical Impression  Pt is pleasant & agreeable to tx. Pt reports she has been cleared to use regular footwear vs offloading shoe for RLE with PT educating pt on need to wear shoes vs socks outside to protect feet & pt voices understanding. Pt is able to don socks without assist and complete bed mobility, transfers (sit<>stand from EOB, toilet transfers) & gait without AD with mod I. Pt reports she feels very close to her baseline level of function & feels she can perform all functional tasks at home. No acute PT needs at this time, will d/c from PT services. Pt to d/c home at mod I level of mobility.  After gait HR = 102 bpm, SpO2 = 100% on room air.    Follow Up Recommendations No PT follow up    Equipment Recommendations  None recommended by PT    Recommendations for Other Services       Precautions / Restrictions Precautions Precautions: None Restrictions Weight Bearing Restrictions: No      Mobility  Bed Mobility Overal bed mobility: Modified Independent                  Transfers Overall transfer level: Modified independent                  Ambulation/Gait Ambulation/Gait assistance: Modified independent (Device/Increase time) Gait Distance (Feet): 300 Feet Assistive device: None       General Gait Details: decreased weight shift R  Stairs            Wheelchair Mobility    Modified Rankin (Stroke Patients Only)       Balance Overall balance assessment: Modified Independent                                            Pertinent Vitals/Pain Pain Assessment: 0-10 Pain Score: 6  Pain Location: LLE Pain Descriptors / Indicators: Aching Pain Intervention(s): Limited activity within patient's tolerance;Monitored during session    Home Living Family/patient expects to be discharged to:: Private residence Living Arrangements: Children Available Help at Discharge: Family;Available PRN/intermittently Type of Home: Apartment Home Access: Level entry     Home Layout: Two level Home Equipment: Walker - 2 wheels      Prior Function Level of Independence: Independent               Hand Dominance        Extremity/Trunk Assessment   Upper Extremity Assessment Upper Extremity Assessment: Overall WFL for tasks assessed    Lower Extremity Assessment Lower Extremity Assessment: Overall WFL for tasks assessed    Cervical / Trunk Assessment Cervical / Trunk Assessment: Normal  Communication   Communication: No difficulties  Cognition Arousal/Alertness: Awake/alert Behavior During Therapy: WFL for tasks assessed/performed Overall Cognitive Status: Within Functional Limits for tasks assessed  General Comments: Pt demonstrates good awareness re: deficits      General Comments General comments (skin integrity, edema, etc.): gait without AD without LOB    Exercises     Assessment/Plan    PT Assessment Patent does not need any further PT services  PT Problem List         PT Treatment Interventions      PT Goals (Current goals can be found in the Care Plan section)  Acute Rehab PT Goals Patient Stated Goal: go home PT Goal Formulation: With patient Time For Goal Achievement: 08/05/20 Potential to Achieve Goals: Good    Frequency     Barriers to discharge        Co-evaluation               AM-PAC PT "6 Clicks" Mobility  Outcome Measure Help needed turning from your back to your side while in a flat bed  without using bedrails?: None Help needed moving from lying on your back to sitting on the side of a flat bed without using bedrails?: None Help needed moving to and from a bed to a chair (including a wheelchair)?: None Help needed standing up from a chair using your arms (e.g., wheelchair or bedside chair)?: None Help needed to walk in hospital room?: None Help needed climbing 3-5 steps with a railing? : None 6 Click Score: 24    End of Session   Activity Tolerance: Patient tolerated treatment well Patient left: in bed;with call bell/phone within reach;with bed alarm set Nurse Communication: Mobility status      Time: 9379-0240 PT Time Calculation (min) (ACUTE ONLY): 22 min   Charges:   PT Evaluation $PT Eval Low Complexity: 1 Low PT Treatments $Therapeutic Activity: 8-22 mins        Lavone Nian, PT, DPT 07/23/20, 2:46 PM   Waunita Schooner 07/23/2020, 2:44 PM

## 2020-07-23 NOTE — Consult Note (Signed)
[address] [place], [place] [address] Phone [phone]. Fax [phone]  Date: 07/23/2020                  Patient Name:  Regina Carpenter  MRN: [id number]  DOB: 24-Jun-1976  Age / Sex: 44 y.o., female         PCP: Emmaline Kluver., MD                 Service Requesting Consult: IM/ Lavina Hamman, MD                 Reason for Consult: Hematuria            History of Present Illness: Patient is a 44 y.o. female with medical problems of rheumatoid arthritis, polyarteritis nodosa with cutaneous involvement, ischemic digits, medium vessel vasculitis, who was admitted to [facility] on 07/17/2020 for evaluation of bilateral leg pain for the past 2 days prior to admission.  Patient also has lower extremity ulcers.  She had right transmetatarsal amputation recently in August 2021.  She primarily presents for worsening pain in her left foot and was found to have gangrene of the second and the fifth toes with ulcers.  She was treated for infection with broad-spectrum antibiotics with vancomycin and cefepime  Urinalysis indicated large hemoglobin, greater than 50 RBCs Nephrology consult has now been requested for further evaluation   Medications: Outpatient medications: Medications Prior to Admission  Medication Sig Dispense Refill Last Dose  . amLODipine (NORVASC) 2.5 MG tablet Take 2.5 mg by mouth daily.   07/16/2020 at Unknown time  . cyclophosphamide (CYTOXAN) 50 MG capsule Take 50 mg by mouth daily. Take with food to minimize GI upset. Take early in the day and maintain hydration.   07/16/2020 at Unknown time  . predniSONE (DELTASONE) 20 MG tablet Take 40 mg by mouth daily with breakfast.    07/16/2020 at Unknown time  . oxyCODONE-acetaminophen (PERCOCET) 5-325 MG tablet Take 1 tablet by mouth every 6 (six) hours as needed for severe pain. (Patient not taking: Reported on 07/17/2020) 20 tablet 0 Completed Course at Unknown time    Current medications: Current  Facility-Administered Medications  Medication Dose Route Frequency Provider Last Rate Last Admin  . acetaminophen (TYLENOL) tablet 650 mg  650 mg Oral Q6H PRN Agbata, Tochukwu, MD   650 mg at 07/17/20 1529  . amLODipine (NORVASC) tablet 5 mg  5 mg Oral Daily Nolberto Hanlon, MD   5 mg at 07/23/20 0829  . collagenase (SANTYL) ointment   Topical Daily Nolberto Hanlon, MD   Given at 07/23/20 1124  . enoxaparin (LOVENOX) injection 47.5 mg  0.5 mg/kg Subcutaneous Q24H Benita Gutter, RPH   47.5 mg at 07/23/20 0830  . famotidine (PEPCID) tablet 20 mg  20 mg Oral Daily Nolberto Hanlon, MD   20 mg at 07/23/20 0829  . gabapentin (NEURONTIN) capsule 300 mg  300 mg Oral TID Nolberto Hanlon, MD   300 mg at 07/23/20 0829  . HYDROmorphone (DILAUDID) injection 1 mg  1 mg Intravenous Q4H PRN Agbata, Tochukwu, MD   1 mg at 07/23/20 1117  . Immune Globulin 10% (PRIVIGEN) 95 g  95 g Intravenous Q24 Hr x 2 Lavina Hamman, MD 240 mL/hr at 07/23/20 1251 Rate Change at 07/23/20 1251  . methylPREDNISolone sodium succinate (SOLU-MEDROL) 40 mg/mL injection 40 mg  40 mg Intravenous Q12H Agbata, Tochukwu, MD   40 mg at 07/23/20 0547  . nicotine (NICODERM CQ - dosed  in mg/24 hours) patch 14 mg  14 mg Transdermal Daily Agbata, Tochukwu, MD   14 mg at 07/23/20 0830  . ondansetron (ZOFRAN) tablet 4 mg  4 mg Oral Q6H PRN Agbata, Tochukwu, MD       Or  . ondansetron (ZOFRAN) injection 4 mg  4 mg Intravenous Q6H PRN Agbata, Tochukwu, MD   4 mg at 07/18/20 0951  . oxyCODONE-acetaminophen (PERCOCET/ROXICET) 5-325 MG per tablet 1-2 tablet  1-2 tablet Oral Q4H PRN Nolberto Hanlon, MD   2 tablet at 07/23/20 0836  . piperacillin-tazobactam (ZOSYN) IVPB 3.375 g  3.375 g Intravenous Q8H Berle Mull M, MD      . sodium chloride flush (NS) 0.9 % injection 10-40 mL  10-40 mL Intracatheter Q12H Nolberto Hanlon, MD   10 mL at 07/23/20 1130  . sodium chloride flush (NS) 0.9 % injection 10-40 mL  10-40 mL Intracatheter PRN Nolberto Hanlon, MD           Allergies: Allergies  Allergen Reactions  . Ibuprofen Other (See Comments)    Pt states that she sweats profusely after taking ibuprofen Other reaction(s): Other (See Comments) "sweating:"  . Sulfasalazine Rash      Past Medical History: Past Medical History:  Diagnosis Date  . Arthralgia of right knee   . Arthritis   . Cellulitis of left leg   . Collagen vascular disease ([place])   . Lymphadenopathy, mesenteric   . Right hand pain   . Tobacco abuse      Past Surgical History: Past Surgical History:  Procedure Laterality Date  . denies    . LOWER EXTREMITY ANGIOGRAPHY Bilateral 05/19/2020   Procedure: Lower Extremity Angiography;  Surgeon: Algernon Huxley, MD;  Location: [facility];  Service: Cardiovascular;  Laterality: Bilateral;  . 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   Procedure: TRANSMETATARSAL AMPUTATION;  Surgeon: Sharlotte Alamo, DPM;  Location: [facility];  Service: Podiatry;  Laterality: Right;     Family History: Family History  Problem Relation Age of Onset  . Leukemia Maternal Grandfather   . Alcohol abuse Father      Social History: Social History   Socioeconomic History  . Marital status: Single    Spouse name: Not on file  . Number of children: Not on file  . Years of education: Not on file  . Highest education level: Not on file  Occupational History  . Not on file  Tobacco Use  . Smoking status: Current Every Day Smoker    Packs/day: 0.50    Types: Cigarettes  . Smokeless tobacco: Never Used  Vaping Use  . Vaping Use: Never used  Substance and Sexual Activity  . Alcohol use: No  . Drug use: No  . Sexual activity: Not on file  Other Topics Concern  . Not on file  Social History Narrative   Lives at home with father   Social Determinants of Health   Financial Resource Strain:   . Difficulty of Paying Living Expenses: Not on file  Food Insecurity:   . Worried About Charity fundraiser in the Last Year: Not on file  .  Ran Out of Food in the Last Year: Not on file  Transportation Needs:   . Lack of Transportation (Medical): Not on file  . Lack of Transportation (Non-Medical): Not on file  Physical Activity:   . Days of Exercise per Week: Not on file  . Minutes of Exercise per Session: Not on file  Stress:   . Feeling  of Stress : Not on file  Social Connections:   . Frequency of Communication with Friends and Family: Not on file  . Frequency of Social Gatherings with Friends and Family: Not on file  . Attends Religious Services: Not on file  . Active Member of Clubs or Organizations: Not on file  . Attends Archivist Meetings: Not on file  . Marital Status: Not on file  Intimate Partner Violence:   . Fear of Current or Ex-Partner: Not on file  . Emotionally Abused: Not on file  . Physically Abused: Not on file  . Sexually Abused: Not on file     Review of Systems: Gen: Subjective fevers prior to admission HEENT: Denies vision complaints, denies oral ulcers CV: No chest pain no previous heart problems Resp: No cough or sputum production GI: Appetite has been good.  No nausea, vomiting or blood in the stool and hematemesis GU : No history of gross hematuria.  Microscopic hematuria noted in August as well as October MS: Reports multiple joint pains all over Derm: Skin ulcers especially below the knee  Psych: No complaints Heme: No complaints Neuro: No complaints Endocrine.  No complaints  Vital Signs: Blood pressure 117/83, pulse 78, temperature 97.8 F (36.6 C), temperature source Oral, resp. rate 17, height 5\' 5"  (1.651 m), weight 93.4 kg, last menstrual period 07/17/2020, SpO2 100 %.   Intake/Output Summary (Last 24 hours) at 07/23/2020 1443 Last data filed at 07/23/2020 1439 Gross per 24 hour  Intake 1100 ml  Output 800 ml  Net 300 ml    Weight trends: [employer]   07/17/20 0308  Weight: 93.4 kg    Physical Exam: General:  Well-appearing, sitting up in the bed   HEENT  anicteric, moist oral mucous membranes, lip piercings  Neck:  Supple, no thyromegaly  Lungs:  Normal breathing effort, clear to auscultation  Heart::  Regular, soft systolic murmur  Abdomen:  Soft, nontender, nondistended  Extremities:  Right foot transmetatarsal amputation, left foot fifth ischemic digit,  Neurologic:  Alert, oriented  Skin:  Ulcers noted on the left foot and left leg    Lab results: Basic Metabolic Panel: Recent Labs  Lab 07/17/20 1450 07/18/20 0638 07/19/20 0404 07/19/20 0404 07/20/20 0528 07/21/20 0342 07/22/20 0413  NA 137  --  137  --   --   --  138  K 3.7  --  4.0  --   --   --  3.8  CL 102  --  105  --   --   --  103  CO2 24  --  25  --   --   --  24  GLUCOSE 90  --  100*  --   --   --  102*  BUN 6  --  11  --   --   --  16  CREATININE 0.81   < > 0.59   < > 0.68 0.72 0.83  CALCIUM 8.8*  --  8.9  --   --   --  9.1   < > = values in this interval not displayed.    Liver Function Tests: Recent Labs  Lab 07/17/20 0313  AST 14*  ALT 11  ALKPHOS 67  BILITOT 0.6  PROT 7.8  ALBUMIN 3.7   No results for input(s): LIPASE, AMYLASE in the last 168 hours. No results for input(s): AMMONIA in the last 168 hours.  CBC: Recent Labs  Lab 07/17/20 0313 07/18/20 6045 07/22/20 0413  07/23/20 0555  WBC 20.9*   < > 13.5* 18.0*  NEUTROABS 14.9*  --   --   --   HGB 10.7*   < > 10.1* 10.3*  HCT 36.1   < > 34.6* 35.1*  MCV 77.6*   < > 77.9* 77.7*  PLT 303   < > 355 358   < > = values in this interval not displayed.    Cardiac Enzymes: No results for input(s): CKTOTAL, TROPONINI in the last 168 hours.  BNP: Invalid input(s): POCBNP  CBG: No results for input(s): GLUCAP in the last 168 hours.  Microbiology: Recent Results (from the past 720 hour(s))  Blood Culture (routine x 2)     Status: None   Collection Time: 07/17/20  5:29 AM   Specimen: BLOOD  Result Value Ref Range Status   Specimen Description BLOOD LEFT [facility]  Final   Special  Requests   Final    BOTTLES DRAWN AEROBIC AND ANAEROBIC Blood Culture adequate volume   Culture   Final    NO GROWTH 5 DAYS Performed at [facility], [address] [place], [place] [address]    Report Status 07/22/2020 FINAL  Final  Blood Culture (routine x 2)     Status: None   Collection Time: 07/17/20  5:30 AM   Specimen: BLOOD  Result Value Ref Range Status   Specimen Description BLOOD RIGHT [facility]  Final   Special Requests   Final    BOTTLES DRAWN AEROBIC AND ANAEROBIC Blood Culture adequate volume   Culture   Final    NO GROWTH 5 DAYS Performed at [facility], [address] [place], [place] [address]    Report Status 07/22/2020 FINAL  Final  Respiratory Panel by RT PCR (Flu A&B, Covid) - Nasopharyngeal Swab     Status: None   Collection Time: 07/17/20  8:30 AM   Specimen: Nasopharyngeal Swab  Result Value Ref Range Status   SARS Coronavirus 2 by RT PCR NEGATIVE NEGATIVE Final    Comment: (NOTE) SARS-CoV-2 target nucleic acids are NOT DETECTED.  The SARS-CoV-2 RNA is generally detectable in upper respiratoy specimens during the acute phase of infection. The lowest concentration of SARS-CoV-2 viral copies this assay can detect is 131 copies/mL. A negative result does not preclude SARS-Cov-2 infection and should not be used as the sole basis for treatment or other patient management decisions. A negative result may occur with  improper specimen collection/handling, submission of specimen other than nasopharyngeal swab, presence of viral mutation(s) within the areas targeted by this assay, and inadequate number of viral copies (<131 copies/mL). A negative result must be combined with clinical observations, patient history, and epidemiological information. The expected result is Negative.  Fact Sheet for Patients:  PinkCheek.be  Fact Sheet for Healthcare Providers:  GravelBags.it  This test  is no t yet approved or cleared by the [place] FDA and  has been authorized for detection and/or diagnosis of SARS-CoV-2 by FDA under an Emergency Use Authorization (EUA). This EUA will remain  in effect (meaning this test can be used) for the duration of the COVID-19 declaration under Section 564(b)(1) of the Act, 21 U.S.C. section 360bbb-3(b)(1), unless the authorization is terminated or revoked sooner.     Influenza A by PCR NEGATIVE NEGATIVE Final   Influenza B by PCR NEGATIVE NEGATIVE Final    Comment: (NOTE) The Xpert Xpress SARS-CoV-2/FLU/RSV assay is intended as an aid in  the diagnosis of influenza from Nasopharyngeal swab specimens  and  should not be used as a sole basis for treatment. Nasal washings and  aspirates are unacceptable for Xpert Xpress SARS-CoV-2/FLU/RSV  testing.  Fact Sheet for Patients: PinkCheek.be  Fact Sheet for Healthcare Providers: GravelBags.it  This test is not yet approved or cleared by the [place] FDA and  has been authorized for detection and/or diagnosis of SARS-CoV-2 by  FDA under an Emergency Use Authorization (EUA). This EUA will remain  in effect (meaning this test can be used) for the duration of the  Covid-19 declaration under Section 564(b)(1) of the Act, 21  U.S.C. section 360bbb-3(b)(1), unless the authorization is  terminated or revoked. Performed at [facility], [address] [place], [place] [address]   MRSA PCR Screening     Status: None   Collection Time: 07/18/20 12:30 AM   Specimen: Nasopharyngeal  Result Value Ref Range Status   MRSA by PCR NEGATIVE NEGATIVE Final    Comment:        The GeneXpert MRSA Assay (FDA approved for NASAL specimens only), is one component of a comprehensive MRSA colonization surveillance program. It is not intended to diagnose MRSA infection nor to guide or monitor treatment for MRSA infections. Performed at  [facility], [address] [place], [place] [address]   Aerobic Culture (superficial specimen)     Status: None (Preliminary result)   Collection Time: 07/22/20  4:55 PM   Specimen: Ulcer  Result Value Ref Range Status   Specimen Description   Final    ULCER Performed at [facility], [address] [place], [place] [address]    Special Requests   Final    NONE Performed at [facility], [address] [place], [place] [address]    Gram Stain PENDING  Incomplete   Culture   Final    FEW PSEUDOMONAS AERUGINOSA CULTURE REINCUBATED FOR BETTER GROWTH Performed at [facility], [place] [address], [place], [place] [address]    Report Status PENDING  Incomplete  Aerobic Culture (superficial specimen)     Status: None (Preliminary result)   Collection Time: 07/22/20  4:56 PM   Specimen: Wound  Result Value Ref Range Status   Specimen Description WOUND  Final   Special Requests LEFT CALF ULCER  Final   Gram Stain   Final    FEW WBC PRESENT, PREDOMINANTLY PMN NO ORGANISMS SEEN    Culture   Final    CULTURE REINCUBATED FOR BETTER GROWTH Performed at [facility], [place] [address], [place],  [address]    Report Status PENDING  Incomplete     Coagulation Studies: No results for input(s): LABPROT, INR in the last 72 hours.  Urinalysis: No results for input(s): COLORURINE, LABSPEC, PHURINE, GLUCOSEU, HGBUR, BILIRUBINUR, KETONESUR, PROTEINUR, UROBILINOGEN, NITRITE, LEUKOCYTESUR in the last 72 hours.  Invalid input(s): APPERANCEUR      Imaging:  No results found.   Assessment & Plan: Pt is a 44 y.o. African-American   female with Rheumatoid arthritis, polyarteritis nodosa with cutaneous involvement, ischemic digits, medium vessel vasculitis was admitted on 07/17/2020 with Fever [R50.9] Cellulitis of left foot [L03.116] Bilateral lower leg cellulitis [L03.116, L03.115] Sepsis, due to unspecified organism, unspecified whether  acute organ dysfunction present [facility]) [A41.9]   #Microscopic hematuria #Polyarteritis nodosa/medium vessel vasculitis. Patient has extensive history of connective tissue disease with history of uveitis and inflammatory arthritis.  She developed ischemic toes and ulcerative lesions in lower extremities this summer (June).  In June 2021-  negative ANCA, cryoglobulins, hepatitis B and C.  Lupus anticoagulant, anticardiolipin antibodies negative.  She was thought to have cutaneous vasculitis/polyarthritis.  Vasculitis diagnosed by skin biopsy showing medium vessel vasculitis.  Treated with prednisone and methotrexate, and then subsequently with Cytoxan.  She was seen by Dr. Percell Miller at [facility] vasculitis clinic on 05/06/20 ANA neg in 03/2015   She now comes in for new ulcers in the lower extremities despite prednisone and Cytoxan treatment.  Also noted to have dry gangrene of the left foot.  Urinalysis at admission on 1017 shows large hemoglobin, negative protein, greater than 50 RBCs, 11-20 WBCs.  Imaging: Previous CT angiogram in May 2021 showed arterial structures are widely patent without focal stenosis.  Minimal atherosclerotic disease in the abdominal aorta.  Patient currently has multisystem involvement and with microscopic hematuria, it can be presumed that there is some degree of renal involvement.  Other differential includes development of cystitis from side effect for therapy.  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  Patient was getting cyclophosphamide 50 to 75 mg as outpatient.  Consider increased dose of cyclophosphamide at 100 mg daily, with lower dose of prednisone.  Patient will also follow-up at vasculitis clinic at [facility], for consideration of IV cyclophosphamide treatment.  She may also need outpatient urology evaluation for consideration of cystoscopy.  Thank you for allowing [place] to participate in the care of this complex  patient.   LOS: 6 Dantae Meunier 10/20/20212:43 PM    Note: This note was prepared with Dragon dictation. Any transcription errors are unintentional

## 2020-07-23 NOTE — Consult Note (Signed)
  Rheumatology follow-up No new symptoms.  She does not feel like she has any new skin lesions.  A good deal of pain. No shortness of breath.  No abdominal pain Cleared by infectious disease  Afebrile.  No change in exam  Impression:  -Polyarteritis, failure of high-dose steroids, methotrexate, cyclophosphamide -Microscopic hematuria.  No definite casts seen by the lab.  Could be part of her illness versus structural abnormality versus Cytoxan related.  Will need to be worked up  Recommendation;  - IVIG today.  Repeat tomorrow.   -Then may discharge on prednisone taper down to 20 mg..  DC Cytoxan - please schedule follow-up with me in 2 weeks upon discharge  Thank you very much for your care of this patient

## 2020-07-23 NOTE — Progress Notes (Addendum)
   Date of Admission:  07/17/2020   T  Patient has systemic vasculitis in setting of chronic connective tissue disease. She has history of uveitis She has medium vessel vasculitis biopsy-positive.  negative ANA and ANCA. She has a history of mesenteric adenitis.    Subjective: Has headache Walked with PT in th corridor Has pain in her legs   Medications:  . amLODipine  5 mg Oral Daily  . collagenase   Topical Daily  . enoxaparin (LOVENOX) injection  0.5 mg/kg Subcutaneous Q24H  . famotidine  20 mg Oral Daily  . gabapentin  300 mg Oral TID  . methylPREDNISolone (SOLU-MEDROL) injection  40 mg Intravenous Q12H  . nicotine  14 mg Transdermal Daily  . sodium chloride flush  10-40 mL Intracatheter Q12H    Objective: Vital signs in last 24 hours: Temp:  [97.7 F (36.5 C)-99 F (37.2 C)] 98.5 F (36.9 C) (10/20 0740) Pulse Rate:  [77-84] 77 (10/20 0740) Resp:  [16-18] 18 (10/20 0740) BP: (121-142)/(75-99) 123/85 (10/20 0740) SpO2:  [98 %-100 %] 100 % (10/20 0740)  PHYSICAL EXAM:  General: Alert, cooperative,  Lungs: Clear to auscultation bilaterally. No Wheezing or Rhonchi. No rales. Heart: Regular rate and rhythm, no murmur, rub or gallop. Abdomen: Soft, non-tender,not distended. Bowel sounds normal. No masses Extremities: multiple punched out ulcers left > rt leg Rt TMA Dry gangrene of the left toes      Skin:as above Lymph: Cervical, supraclavicular normal. Neurologic: Grossly non-focal  Lab Results Recent Labs    07/21/20 0342 07/22/20 0413 07/23/20 0555  WBC  --  13.5* 18.0*  HGB  --  10.1* 10.3*  HCT  --  34.6* 35.1*  NA  --  138  --   K  --  3.8  --   CL  --  103  --   CO2  --  24  --   BUN  --  16  --   CREATININE 0.72 0.83  --    Liver Panel No results for input(s): PROT, ALBUMIN, AST, ALT, ALKPHOS, BILITOT, BILIDIR, IBILI in the last 72 hours. Sedimentation Rate No results for input(s): ESRSEDRATE in the last 72 hours. C-Reactive  Protein Recent Labs    07/21/20 0342  CRP 3.1*    Microbiology: [facility] from 07/17/20   Assessment/Plan:  Punched out ulcers- multiple in lower extremities  Does not look grossly infected but because of worsening leukocytosis will DC Vanco and ceftriaxone change to Zosyn as she has had Pseudomonas in the right foot in the past.   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 1 PPD smoker- discussed with her that quitting will help with ulcer healing  Discussed the management with patient and care team

## 2020-07-23 NOTE — Progress Notes (Addendum)
[facility] Progress Note  Patient: Regina Carpenter    YNW:[id number]  DOA: 07/17/2020     Date of Service: the patient was seen and examined on 07/23/2020  Brief hospital course: [name] a 44 y.o.femalewith medical history significant forrheumatoid arthritis, polyarteritis vasculitis, chronic bilateral foot wounds who presents to the emergency department forevaluation of worsening pain in her left lower extremity. With leg ulcers due to her vasculitis.On iv vanco and Rocephin.  Currently plan is IVIG for 2 days.  Assessment and Plan: Sepsis POA due to cellulitis Present on admission and evidenced by low-grade fever with a T-max of 99,tachycardia,marked leukocytosis with a left shift,bilateral lower extremity ulcers with purulent drainage and elevated lactic acid level present on admission ID following Currently on IV Zosyn. ID plan to switch to p.o. soon. cleared from infection standpoint to receive IVIG. 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  Amputation of toes with her circulation would not heal and could make the situation worse.  Podiatry felt the gangrenous changes on the left fifth toe is stable and the right fourth toe is also stable and has not progressed. 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  Status post methotrexate and prednisone Rheumatology following,  ecommends IVIG  Continue with IV steroids and eventually go on to low-dose taper, with baseline dose of prednisone 20 mg daily. Patient did not receive cyclophosphamide as it is not carried in our pharmacy and Dr. Jefm Bryant was okay with not giving her this for now.  Nicotine dependence Was counseled on cessation  Continue nicotine patch  Hypertension bp improving, pain likely causes some elevation Monitor and if need to will increase amlodipine to 10mg  qd  Obesity Will benefit from dietary consultation outpatient. Body mass index is 34.28 kg/m.   Microscopic hematuria. Appreciate neurology input. Recommending inpatient status overdose 200 mg daily with a lower dose of prednisone outpatient.  Diet: Cardiac diet DVT Prophylaxis: Subcutaneous Lovenox      Advance goals of care discussion: Full code  Family Communication: no family was present at bedside, at the time of interview.   Disposition:  Status is: Inpatient  Remains inpatient appropriate because:IV treatments appropriate due to intensity of illness or inability to take PO   Dispo: The patient is from: Home              Anticipated d/c is to: Home              Anticipated d/c date is: 1 day              Patient currently is not medically stable to d/c.  Subjective: No nausea no vomiting.  No fever no chills.  No chest pain.  No abdominal pain.  No diarrhea.  Still has ulcers on  the leg.  Physical Exam:  General: Appear in mild distress, no Rash; Oral Mucosa Clear, moist. no Abnormal Neck Mass Or lumps, Conjunctiva normal  Cardiovascular: S1 and S2 Present, no Murmur, Respiratory: good respiratory effort, Bilateral Air entry present and CTA, no Crackles,  no wheezes Abdomen: Bowel Sound present, Soft and no tenderness Extremities: bilateral  Pedal edema Neurology: alert and oriented to time, place, and person affect appropriate. no new focal deficit Gait not checked due to patient safety concerns  Vitals:   07/23/20 1149 07/23/20 1211 07/23/20 1250 07/23/20 1541  BP: 123/87 (!) 127/98 117/83 (!) 122/94  Pulse: 76 78 78 96  Resp: 16 18 17 18   Temp: 98.5 F (36.9 C) 98.5 F (36.9 C) 97.8 F (36.6 C) 97.7 F (36.5 C)  TempSrc: Oral Oral Oral Oral  SpO2: 100% 100% 100% 100%  Weight:      Height:        Intake/Output Summary (Last 24 hours) at 07/23/2020 1837 Last data filed at 07/23/2020 1439 Gross per 24 hour  Intake 600 ml  Output 800 ml  Net -200 ml   Filed Weights   07/17/20 0308  Weight: 93.4 kg    Data Reviewed: I have personally reviewed and interpreted daily labs, tele strips, imagings as discussed above. I reviewed all nursing notes, pharmacy notes, vitals, pertinent old records I have discussed plan of care as described above with RN and patient/family.  CBC: Recent Labs  Lab 07/17/20 0313 07/17/20 0313 07/18/20 0263 07/19/20 0404 07/20/20 0528 07/22/20 0413 07/23/20 0555  WBC 20.9*   < > 21.3* 21.2* 12.9* 13.5* 18.0*  NEUTROABS 14.9*  --   --   --   --   --   --   HGB 10.7*   < > 9.7* 9.2* 8.9* 10.1* 10.3*  HCT 36.1   < > 33.4* 31.4* 30.5* 34.6* 35.1*  MCV 77.6*   < > 78.0* 77.5* 78.4* 77.9* 77.7*  PLT 303   < > 285 285 299 355 358   < > = values in this interval not displayed.   Basic Metabolic Panel: Recent Labs  Lab 07/17/20 0313 07/17/20 0313 07/17/20 1450 07/17/20 1450 07/18/20 7858 07/19/20 0404 07/20/20 0528 07/21/20 0342 07/22/20 0413  NA 138  --  137  --   --  137  --   --  138  K 3.6  --  3.7  --   --  4.0  --   --  3.8  CL 101  --  102  --   --  105  --   --  103  CO2 26  --  24  --   --  25  --   --  24  GLUCOSE 88  --  90  --   --  100*  --   --  102*  BUN 8  --  6  --   --   11  --   --  16  CREATININE 0.75   < > 0.81   < > 0.72 0.59 0.68 0.72 0.83  CALCIUM 9.1  --  8.8*  --   --  8.9  --   --  9.1   < > = values in this interval not displayed.    Studies: No results found.  Scheduled Meds: . amLODipine  5 mg Oral Daily  . collagenase   Topical Daily  . enoxaparin (LOVENOX) injection  0.5 mg/kg Subcutaneous Q24H  . famotidine  20  mg Oral Daily  . gabapentin  300 mg Oral TID  . methylPREDNISolone (SOLU-MEDROL) injection  40 mg Intravenous Q12H  . nicotine  14 mg Transdermal Daily  . sodium chloride flush  10-40 mL Intracatheter Q12H   Continuous Infusions: . Immune Globulin 10% (PRIVIGEN) 95 g Stopped (07/23/20 1750)  . piperacillin-tazobactam (ZOSYN)  IV 3.375 g (07/23/20 1756)   PRN Meds: acetaminophen, HYDROmorphone (DILAUDID) injection, ondansetron **OR** ondansetron (ZOFRAN) IV, oxyCODONE-acetaminophen, sodium chloride flush  Time spent: 35 minutes  Author: Berle Mull, MD [facility] 07/23/2020 6:37 PM  To reach On-call, see care teams to locate the attending and reach out via [email or web address]. Between 7PM-7AM, please contact night-coverage If you still have difficulty reaching the attending provider, please page the [facility] (Director on Call) for [facility] on amion for assistance.

## 2020-07-23 NOTE — Consult Note (Signed)
Pharmacy Antibiotic Note  Kalianne Fetting is a 44 y.o. 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  Pharmacy has been consulted for vancomycin dosing. Immunosuppression has been continued and steroid dosing escalated given suspected progression of connective tissue disease.   Today, 07/23/20  Day # 7 antibiotics --WBC 13.5>18 -- patient on corticosteroids for polyarteritis nodosa --SCr largely stable   Plan:   Vanc dc'ed  Start Zosyn 3.375g q8h (4 hour extended infusion)  Monitor renal function  Follow length of therapy    Height: 5\' 5"  (165.1 cm) Weight: 93.4 kg (206 lb) IBW/kg (Calculated) : 57  Temp (24hrs), Avg:98.3 F (36.8 C), Min:97.7 F (36.5 C), Max:99 F (37.2 C)  Recent Labs  Lab  0000 07/17/20 0313 07/17/20 0530 07/17/20 0815 07/17/20 1450 07/18/20 0076 07/19/20 0404 07/20/20 0528 07/21/20 0342 07/22/20 0004 07/22/20 0413 07/23/20 0555  WBC   < > 20.9*  --   --   --  21.3* 21.2* 12.9*  --   --  13.5* 18.0*  CREATININE  --  0.75  --   --    < > 0.72 0.59 0.68 0.72  --  0.83  --   LATICACIDVEN  --  2.3* 3.1* 1.7  --   --   --   --   --   --   --   --   VANCOTROUGH  --   --   --   --   --   --   --   --   --  7*  --   --    < > = values in this interval not displayed.    Estimated Creatinine Clearance: 96.7 mL/min (by C-G formula based on SCr of 0.83 mg/dL).    Antimicrobials this admission: ceftriaxone 10/14 >> 10/19 vancomycin 10/14 >> 10/20 Zosyn 10/20 >>  Microbiology results: 10/14 BCx: NGTD 10/14 SARS CoV-2: negative  10/14 influenza A/B: negative 10/15 MRSA PCR negative  Thank you for allowing pharmacy to be a part of this patient's care.  Lu Duffel, PharmD, BCPS Clinical Pharmacist 07/23/2020 12:15 PM

## 2020-07-24 DIAGNOSIS — L97922 Non-pressure chronic ulcer of unspecified part of left lower leg with fat layer exposed: Secondary | ICD-10-CM

## 2020-07-24 DIAGNOSIS — L97912 Non-pressure chronic ulcer of unspecified part of right lower leg with fat layer exposed: Secondary | ICD-10-CM | POA: Diagnosis not present

## 2020-07-24 DIAGNOSIS — M318 Other specified necrotizing vasculopathies: Secondary | ICD-10-CM | POA: Diagnosis not present

## 2020-07-24 DIAGNOSIS — I10 Essential (primary) hypertension: Secondary | ICD-10-CM | POA: Diagnosis not present

## 2020-07-24 LAB — BASIC METABOLIC PANEL
Anion gap: 7 (ref 5–15)
BUN: 21 mg/dL — ABNORMAL HIGH (ref 6–20)
CO2: 24 mmol/L (ref 22–32)
Calcium: 8.6 mg/dL — ABNORMAL LOW (ref 8.9–10.3)
Chloride: 104 mmol/L (ref 98–111)
Creatinine, Ser: 0.74 mg/dL (ref 0.44–1.00)
GFR, Estimated: >60 mL/min (ref >60)
Glucose, Bld: 106 mg/dL — ABNORMAL HIGH (ref 70–99)
Potassium: 3.6 mmol/L (ref 3.5–5.1)
Sodium: 135 mmol/L (ref 135–145)

## 2020-07-24 LAB — CBC
HCT: 32.4 % — ABNORMAL LOW (ref 36.0–46.0)
Hemoglobin: 9.7 g/dL — ABNORMAL LOW (ref 12.0–15.0)
MCH: 23.4 pg — ABNORMAL LOW (ref 26.0–34.0)
MCHC: 29.9 g/dL — ABNORMAL LOW (ref 30.0–36.0)
MCV: 78.3 fL — ABNORMAL LOW (ref 80.0–100.0)
Platelets: 351 10*3/uL (ref 150–400)
RBC: 4.14 MIL/uL (ref 3.87–5.11)
RDW: 18.3 % — ABNORMAL HIGH (ref 11.5–15.5)
WBC: 14.3 10*3/uL — ABNORMAL HIGH (ref 4.0–10.5)
nRBC: 0 % (ref 0.0–0.2)

## 2020-07-24 MED ORDER — GABAPENTIN 600 MG PO TABS
300.0000 mg | ORAL_TABLET | Freq: Three times a day (TID) | ORAL | 0 refills | Status: DC
Start: 2020-07-24 — End: 2024-05-23

## 2020-07-24 MED ORDER — CYCLOPHOSPHAMIDE 50 MG PO CAPS
100.0000 mg | ORAL_CAPSULE | Freq: Every day | ORAL | 0 refills | Status: DC
Start: 2020-07-24 — End: 2024-03-14

## 2020-07-24 MED ORDER — NICOTINE 14 MG/24HR TD PT24: 14.0000 mg | MEDICATED_PATCH | Freq: Every day | TRANSDERMAL | 0 refills | Status: DC

## 2020-07-24 MED ORDER — PREDNISONE 10 MG PO TABS: ORAL_TABLET | ORAL | 0 refills | Status: DC

## 2020-07-24 MED ORDER — AMLODIPINE BESYLATE 5 MG PO TABS
5.0000 mg | ORAL_TABLET | Freq: Every day | ORAL | 0 refills | Status: DC
Start: 2020-07-25 — End: 2024-03-14

## 2020-07-24 MED ORDER — FAMOTIDINE 20 MG PO TABS
20.0000 mg | ORAL_TABLET | Freq: Every day | ORAL | 0 refills | Status: DC
Start: 2020-07-25 — End: 2024-03-14

## 2020-07-24 MED ORDER — COLLAGENASE 250 UNIT/GM EX OINT
TOPICAL_OINTMENT | Freq: Every day | CUTANEOUS | 0 refills | Status: DC
Start: 2020-07-25 — End: 2024-03-14

## 2020-07-24 NOTE — Progress Notes (Signed)
ID  Still has pain legs No other complaints Patient Vitals for the past 24 hrs:  BP Temp Temp src Pulse Resp SpO2  07/24/20 1714 125/86 97.6 F (36.4 C) Oral 88 -- 100 %  07/24/20 1212 (!) 137/97 98 F (36.7 C) Oral (!) 101 18 99 %  07/24/20 1028 133/83 97.7 F (36.5 C) Axillary 99 17 100 %  07/24/20 0738 (!) 131/94 98.8 F (37.1 C) Oral 88 18 100 %  07/24/20 0006 (!) 126/92 98 F (36.7 C) Oral 86 18 96 %  07/23/20 1928 122/81 97.9 F (36.6 C) -- 88 20 99 %   O/e awake and alert Legs dressings not removed Chest b/l air entry [id number]   labs CBC Latest Ref Rng & Units 07/24/2020 07/23/2020 07/22/2020  WBC 4.0 - 10.5 K/uL 14.3(H) 18.0(H) 13.5(H)  Hemoglobin 12.0 - 15.0 g/dL 9.7(L) 10.3(L) 10.1(L)  Hematocrit 36 - 46 % 32.4(L) 35.1(L) 34.6(L)  Platelets 150 - 400 K/uL 351 358 355    CMP Latest Ref Rng & Units 07/24/2020 07/22/2020 07/21/2020  Glucose 70 - 99 mg/dL 106(H) 102(H) -  BUN 6 - 20 mg/dL 21(H) 16 -  Creatinine 0.44 - 1.00 mg/dL 0.74 0.83 0.72  Sodium 135 - 145 mmol/L 135 138 -  Potassium 3.5 - 5.1 mmol/L 3.6 3.8 -  Chloride 98 - 111 mmol/L 104 103 -  CO2 22 - 32 mmol/L 24 24 -  Calcium 8.9 - 10.3 mg/dL 8.6(L) 9.1 -  Total Protein 6.5 - 8.1 g/dL - - -  Total Bilirubin 0.3 - 1.2 mg/dL - - -  Alkaline Phos 38 - 126 U/L - - -  AST 15 - 41 U/L - - -  ALT 0 - 44 U/L - - -    Micro foot ulcer- pseudomonas- susceptibility pending   Impression/recommendation Ulcers feet  multiple in lower extremities  Does not look grossly infected but because of worsening leukocytosis  DC Vanco and ceftriaxone and  changed to Zosyn  Pseudomonas in wound culture superadded infection- await susceptibility to see whether Po cipro is an aoption.   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 1 PPD  smoker- discussed with her that quitting will help with ulcer healing  Dr.Fitzgerald is covering ID tomorrow- call if needed

## 2020-07-24 NOTE — Progress Notes (Signed)
[facility] Progress Note  Patient: Regina Carpenter    LEX:[id number]  DOA: 07/17/2020     Date of Service: the patient was seen and examined on 07/24/2020  Brief hospital course: Regina Carpenter a 44 y.o.femalewith medical history significant forrheumatoid arthritis, polyarteritis vasculitis, chronic bilateral foot wounds who presents to the emergency department forevaluation of worsening pain in her left lower extremity. With leg ulcers due to her vasculitis.On iv vanco and Rocephin.  Currently plan is IVIG, also following ID recommendation for antibiotics on discharge.  Assessment and Plan: Sepsis POA due to cellulitis likely from Pseudomonas Present on admission and evidenced by low-grade fever with a T-max of 99,tachycardia,marked leukocytosis with a left shift,bilateral lower extremity ulcers with purulent drainage and elevated lactic acid level present on admission ID following Currently on IV Zosyn. ID plan to switch to p.o. soon. cleared from infection standpoint to receive IVIG. Blood cultures to date negative Pseudomonas on superficial culture.  Sensitivities currently pending.  Monitor.  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  Amputation of toes would not heal and could make the situation worse due to her vasculitis.  Podiatry felt the gangrenous changes on the left fifth toe is stable and the right fourth toe is  also stable and has not progressed. 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  Status post methotrexate and prednisone Rheumatology following,  ecommends IVIG  Continue with IV steroids and eventually go on to low-dose taper, with baseline dose of prednisone 20 mg daily. Patient did not receive cyclophosphamide as it is not carried in our pharmacy and Dr. Jefm Bryant was okay with not giving her this for now.  Nicotine dependence Was counseled on cessation  Continue nicotine patch  Hypertension bp improving, pain likely causes some elevation Monitor and if need to will increase amlodipine to 10mg  qd  Obesity Will benefit from dietary consultation outpatient. Body mass index is 34.28 kg/m.   Microscopic hematuria. Appreciate neurology input. Recommending inpatient status overdose 200 mg daily with a lower dose of prednisone outpatient.  Diet: Cardiac diet DVT Prophylaxis: Subcutaneous Lovenox   Advance goals of care discussion: Full code  Family Communication: no family was present at bedside, at the time of interview.   Disposition:  Status is: Inpatient  Remains inpatient appropriate because:IV treatments appropriate due to intensity of illness or inability to take PO   Dispo: The patient is from: Home              Anticipated d/c is to: Home              Anticipated d/c date is: 1 day              Patient currently is not medically stable to d/c.  Subjective: Pain well controlled.  No nausea no vomiting.  No fever no chills.  No chest pain.  No abdominal pain.  No diarrhea.  Physical Exam:  General: Appear in mild distress, no Rash; Oral Mucosa Clear, moist. no Abnormal Neck Mass Or lumps,  Conjunctiva normal  Cardiovascular: S1 and S2 Present, no Murmur, Respiratory: good respiratory effort, Bilateral Air entry present and CTA, no Crackles, no wheezes Abdomen: Bowel Sound present, Soft and no tenderness Extremities: bilateral  Pedal edema Neurology: alert and oriented to time, place, and person affect appropriate. no new focal deficit Gait not checked due to patient safety concerns  Vitals:   07/24/20 0738 07/24/20 1028 07/24/20 1212 07/24/20 1714  BP: (!) 131/94 133/83 (!) 137/97 125/86  Pulse: 88 99 (!) 101 88  Resp: 18 17 18    Temp: 98.8 F (37.1 C) 97.7 F (36.5 C) 98 F (36.7 C) 97.6 F (36.4 C)  TempSrc: Oral Axillary Oral Oral  SpO2: 100% 100% 99% 100%  Weight:      Height:        Intake/Output Summary (Last 24 hours) at 07/24/2020 1802 Last data filed at 07/24/2020 1623 Gross per 24 hour  Intake 378.17 ml  Output --  Net 378.17 ml   Filed Weights   07/17/20 0308  Weight: 93.4 kg    Data Reviewed: I have personally reviewed and interpreted daily labs, tele strips, imagings as discussed above. I reviewed all nursing notes, pharmacy notes, vitals, pertinent old records I have discussed plan of care as described above with RN and patient/family.  CBC: Recent Labs  Lab 07/19/20 0404 07/20/20 0528 07/22/20 0413 07/23/20 0555 07/24/20 0413  WBC 21.2* 12.9* 13.5* 18.0* 14.3*  HGB 9.2* 8.9* 10.1* 10.3* 9.7*  HCT 31.4* 30.5* 34.6* 35.1* 32.4*  MCV 77.5* 78.4* 77.9* 77.7* 78.3*  PLT 285 299 355 358 425   Basic Metabolic Panel: Recent Labs  Lab 07/19/20 0404 07/20/20 0528 07/21/20 0342 07/22/20 0413 07/24/20 0413  NA 137  --   --  138 135  K 4.0  --   --  3.8 3.6  CL 105  --   --  103 104  CO2 25  --   --  24 24  GLUCOSE 100*  --   --  102* 106*  BUN 11  --   --  16 21*  CREATININE 0.59 0.68 0.72 0.83 0.74  CALCIUM 8.9  --   --  9.1 8.6*    Studies: No results found.  Scheduled Meds: . amLODipine  5 mg Oral Daily  . collagenase    Topical Daily  . enoxaparin (LOVENOX) injection  0.5 mg/kg Subcutaneous Q24H  . famotidine  20 mg Oral Daily  . gabapentin  300 mg Oral TID  . methylPREDNISolone (SOLU-MEDROL) injection  40 mg Intravenous Q12H  . nicotine  14 mg Transdermal Daily  . sodium chloride flush  10-40 mL Intracatheter Q12H   Continuous Infusions: . piperacillin-tazobactam (ZOSYN)  IV 3.375 g (07/24/20 1623)   PRN Meds: acetaminophen, HYDROmorphone (DILAUDID) injection, ondansetron **OR** ondansetron (ZOFRAN) IV, oxyCODONE-acetaminophen, sodium chloride flush  Time spent: 35 minutes  Author: Berle Mull, MD [facility] 07/24/2020 6:02 PM  To reach On-call, see care teams to locate the attending and reach out via [email or web address]. Between 7PM-7AM, please contact night-coverage If you still have difficulty reaching the attending provider, please page the [facility] (Director on Call) for [facility] on amion for assistance.

## 2020-07-24 NOTE — Consult Note (Signed)
  Rheumatology follow-up: Tolerating IVIG.  She reports no new skin lesions.  No shortness of breath.  No fever Exam unchanged.  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 Marian Sorrow) and will try to coordinate a follow-up there as well

## 2020-07-25 LAB — CBC
HCT: 32 % — ABNORMAL LOW (ref 36.0–46.0)
Hemoglobin: 9.5 g/dL — ABNORMAL LOW (ref 12.0–15.0)
MCH: 23.3 pg — ABNORMAL LOW (ref 26.0–34.0)
MCHC: 29.7 g/dL — ABNORMAL LOW (ref 30.0–36.0)
MCV: 78.4 fL — ABNORMAL LOW (ref 80.0–100.0)
Platelets: 325 10*3/uL (ref 150–400)
RBC: 4.08 MIL/uL (ref 3.87–5.11)
RDW: 18.2 % — ABNORMAL HIGH (ref 11.5–15.5)
WBC: 9.9 10*3/uL (ref 4.0–10.5)
nRBC: 0 % (ref 0.0–0.2)

## 2020-07-25 LAB — AEROBIC CULTURE W GRAM STAIN (SUPERFICIAL SPECIMEN): Culture: NORMAL

## 2020-07-25 MED ORDER — CIPROFLOXACIN HCL 750 MG PO TABS: 750.0000 mg | ORAL_TABLET | Freq: Two times a day (BID) | ORAL | 0 refills | Status: AC

## 2020-07-25 MED ORDER — SACCHAROMYCES BOULARDII 250 MG PO CAPS
250.0000 mg | ORAL_CAPSULE | Freq: Two times a day (BID) | ORAL | 0 refills | Status: DC
Start: 2020-07-25 — End: 2024-07-19

## 2020-07-25 MED ORDER — CIPROFLOXACIN HCL 500 MG PO TABS
750.0000 mg | ORAL_TABLET | Freq: Two times a day (BID) | ORAL | Status: DC
  Administered 2020-07-25: 750 mg via ORAL
  Filled 2020-07-25: qty 2

## 2020-07-25 MED ORDER — OXYCODONE-ACETAMINOPHEN 5-325 MG PO TABS: 1.0000 | ORAL_TABLET | Freq: Four times a day (QID) | ORAL | 0 refills | Status: DC | PRN

## 2020-07-25 MED ORDER — SACCHAROMYCES BOULARDII 250 MG PO CAPS
250.0000 mg | ORAL_CAPSULE | Freq: Two times a day (BID) | ORAL | Status: DC
  Filled 2020-07-25: qty 1

## 2020-07-25 NOTE — Consult Note (Signed)
Pharmacy Antibiotic Note  Regina Carpenter is a 44 y.o. 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  Pharmacy has been consulted for vancomycin dosing. Immunosuppression has been continued and steroid dosing escalated given suspected progression of connective tissue disease.   Today, 07/25/20  Day # 8 antibiotics --WBC 13.5>18>14.3>9.9 -- patient on corticosteroids for polyarteritis nodosa --SCr largely stable   Plan:   Continue Zosyn 3.375g q8h (4 hour extended infusion) - Day 3  Monitor renal function  Follow length of therapy    Height: 5\' 5"  (165.1 cm) Weight: 93.4 kg (206 lb) IBW/kg (Calculated) : 57  Temp (24hrs), Avg:97.8 F (36.6 C), Min:96.3 F (35.7 C), Max:98.6 F (37 C)  Recent Labs  Lab 07/19/20 0404 07/19/20 0404 07/20/20 0528 07/21/20 0342 07/22/20 0004 07/22/20 0413 07/23/20 0555 07/24/20 0413 07/25/20 0519  WBC 21.2*   < > 12.9*  --   --  13.5* 18.0* 14.3* 9.9  CREATININE 0.59  --  0.68 0.72  --  0.83  --  0.74  --   VANCOTROUGH  --   --   --   --  7*  --   --   --   --    < > = values in this interval not displayed.    Estimated Creatinine Clearance: 100.4 mL/min (by C-G formula based on SCr of 0.74 mg/dL).    Antimicrobials this admission: ceftriaxone 10/14 >> 10/19 vancomycin 10/14 >> 10/20 Zosyn 10/20 >>  Microbiology results: 10/14 BCx: NGTD 10/14 SARS CoV-2: negative  10/14 influenza A/B: negative 10/15 MRSA PCR negative 10/19 Ulcer culture: Pseudomonas pending susc  Thank you for allowing pharmacy to be a part of this patient's care.  Lu Duffel, PharmD, BCPS Clinical Pharmacist 07/25/2020 8:14 AM

## 2020-07-26 NOTE — Discharge Summary (Signed)
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 PCP: Follow-up with Dr. Sherral Hammers in 2 weeks.   2. Will need establishing care with [facility] nephrologist. 3. Follow up LABS/TEST: None   Follow-up Information    Emmaline Kluver., MD. Schedule an appointment as soon as possible for a visit in 2 weeks.   Specialty: Rheumatology Why: patient to make own appointment; office close early on Fridays Contact information: [place] [phone]              Diet recommendation: Cardiac diet  Activity: The patient is advised to gradually reintroduce usual activities, as tolerated  Discharge Condition: stable  Code Status: Full code   History of present illness: As per the H and P dictated on admission, "Regina Carpenter is a 44 y.o. female with medical history significant for rheumatoid arthritis, polyarteritis vasculitis, chronic bilateral foot wounds who presents to the emergency department for evaluation of worsening pain in her left lower extremity. She rates her pain a 10 x 10 in intensity at its worst and is noted to have dry gangrene involving the second and fifth toe on the left foot and open draining ulcers involving her toes on the left foot.  Patient also states that she has had a fever at home which was undocumented. 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 She denies having any chest  pain, no shortness of breath, no dizziness, no lightheadedness no nausea, no vomiting, no urinary symptoms or changes in her bowel habits. Labs show sodium 138, potassium 3.6, chloride 101, bicarb 26, BUN 8, creatinine 0.75, calcium 9.1, AST 14, ALT 11, lactic acid 3.1 << 1.7,, white count 20.9, hemoglobin 10.7, hematocrit 36.1, MCV 77.6, RDW 18.5, platelet count 303 Left foot x-ray is negative for osteomyelitis Right foot x-ray shows forefoot amputation without evidence of osteomyelitis.  Soft tissue swelling anterior to the osteotomy sites Twelve-lead EKG reviewed by me shows sinus tachycardia  ED Course: Patient is a 44 year old African-American female with a history of rheumatoid arthritis, polyarteritis vasculitis who is status post recent transmetatarsal amputation of the right forefoot.  She presents to the emergency room for worsening pain in her left foot and has gangrene involving the second and fifth toes on her left foot with draining ulcers involving the other toes and leg on her left foot.  She had a low-grade fever in the emergency room, remains tachycardic, has marked leukocytosis and an elevated lactic acid level.  She received empiric antibiotic therapy with vancomycin and cefepime and will be admitted to the hospital for further evaluation."  Hospital Course:  Summary of her active problems in the hospital is as following. Sepsis POA due to cellulitis likely from Pseudomonas Present on admission and evidenced by low-grade fever with a T-max of 99,tachycardia,marked leukocytosis with a left shift,bilateral lower extremity ulcers with purulent drainage and elevated lactic acid level present on admission ID following, cultures  growing Pseudomonas which is pansensitive. Initially on IV vancomycin and ceftriaxone.  Later on transitioned to Zosyn.  Now on ciprofloxacin. Blood cultures to date negative  Ischemic ulcer of toes on left foot Patient with a history of systemic  vasculitis admitted to the hospital for evaluation of ischemic ulcers involving the second and left fifth toe. She is status post recent transmetatarsal amputation of the right foot continue with wound care recommendation for enzymatic debridement. Once she is stabilized and discharged she will need to follow-up with vascular clinic at [facility] as outpatient  Continue Neurontin to 300 3 times daily as it is helping control some of her neuropathic pain Podiatry saw patient recommended continued conservative therapy.  Amputation so nephro consult was actually for microscopic hematuria biopsy past medical biopsy [facility] follow-up goes we can offertoes would not heal and could make the situation worse due to her vasculitis.  Podiatry felt the gangrenous changes on the left fifth toe is stable and the right fourth toe is also stable and has not progressed. 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 Status post methotrexate and prednisone Rheumatology following, patient received IVIG as well as IV steroids throughout the hospital stay. Patient did not receive cyclophosphamide as it is not carried in our pharmacy and Dr. Jefm Bryant was okay withnot giving her this for now. Based on discussion between Dr. Jefm Bryant as well as nephrology sacrificed, dose will be increased from 50 mg daily to 100 mg daily.  Nicotine dependence Was counseled on cessation Continue nicotine patch  Hypertension bp improving, pain likely causes some elevation   Obesity Will benefit from dietary consultation outpatient. Body mass index is 34.28 kg/m.   Microscopic hematuria. Appreciate neurology input. Recommending increasing the dose of cytoxan to 100  mg daily with a lower dose of prednisone outpatient.  Patient was seen by physical therapy, who recommended Home health,  was arranged. On the day of the discharge the patient's vitals were stable, and no other acute medical condition were reported by patient. The patient was felt safe to be discharge at Home with Home health.  Consultants: Infectious disease, rheumatology, nephrology Procedures: none  Discharge Exam: General: Appear in no distress, bilateral lower leg ulcers, no rash; Oral Mucosa Clear, moist. no Abnormal Neck Mass Or lumps, Conjunctiva normal  Cardiovascular: S1 and S2 Present, no Murmur Respiratory: good respiratory effort, Bilateral Air entry present and CTA, no Crackles, no wheezes Abdomen: Bowel Sound present, Soft and no tenderness Extremities: no Pedal edema Neurology: alert and oriented to time, place, and person affect appropriate. no new focal deficit  Filed Weights   07/17/20 0308  Weight: 93.4 kg   Vitals:   07/25/20 0742 07/25/20 1151  BP: (!) 134/98 124/88  Pulse: 90 82  Resp: 16 16  Temp: 98.5 F (36.9 C) 98.4 F (36.9 C)  SpO2: 100% 98%    DISCHARGE MEDICATION: Allergies as of 07/25/2020      Reactions   Ibuprofen Other (See Comments)   Pt states that she sweats profusely after taking ibuprofen Other reaction(s): Other (See Comments) "sweating:"   Sulfasalazine Rash      Medication List    TAKE these medications   amLODipine 5 MG tablet Commonly known as: NORVASC Take 1 tablet (5 mg total) by mouth daily. What changed:  medication strength  how much to take   ciprofloxacin 750 MG tablet Commonly known as: CIPRO Take 1 tablet (750 mg total) by mouth 2 (two) times daily for 8 days.   collagenase ointment Commonly known as: SANTYL Apply topically daily.   cyclophosphamide 50 MG capsule Commonly known as: CYTOXAN Take 2 capsules (100 mg total) by mouth daily. Take with food to minimize GI upset. Take early in the day and  maintain hydration. What changed: how much to take   famotidine 20 MG tablet Commonly known as: PEPCID Take 1 tablet (20 mg total) by mouth daily.   gabapentin 600 MG tablet Commonly known as: NEURONTIN Take 0.5 tablets (300 mg total) by mouth 3 (three) times daily.   nicotine 14 mg/24hr patch Commonly known as: NICODERM CQ - dosed in mg/24 hours Place 1 patch (14 mg total) onto the skin daily.   oxyCODONE-acetaminophen 5-325 MG tablet Commonly known as: Percocet Take 1 tablet by mouth every 6 (six) hours as needed for severe pain.   predniSONE 10 MG tablet Commonly known as: DELTASONE Take 50mg  daily for 3days,Take 40mg  daily for 3days,Take 30mg  daily for 3days,Take 20mg  daily What changed:   medication strength  how much to take  how to take this  when to take this  additional instructions   saccharomyces boulardii 250 MG capsule Commonly known as: FLORASTOR Take 1 capsule (250 mg total) by mouth 2 (two) times daily.            Discharge Care Instructions  (From admission, onward)         Start     Ordered   07/25/20 0000  Discharge wound care:       Comments: Apply Santyl to bilat leg wound wounds every day, then cover with moist gauze and foam dressings.  Change foam dressings every 3 days or PRN soiling.  Apply xeroform gauze to right stump wound and left 2nd and 5th toes every day and cover with kerlex   07/25/20 1239         Allergies  Allergen Reactions  . Ibuprofen Other (See Comments)    Pt states that she sweats profusely after taking ibuprofen Other reaction(s): Other (See Comments) "sweating:"  . Sulfasalazine Rash   Discharge Instructions    Diet - low sodium heart healthy   Complete by: As directed    Discharge wound care:   Complete by: As directed    Apply Santyl to bilat leg wound wounds every day, then cover with moist gauze and foam dressings.  Change foam dressings every 3 days or PRN soiling.  Apply xeroform gauze to right  stump wound and left 2nd and 5th toes every day and cover with kerlex   Increase activity slowly   Complete by: As directed       The results of significant diagnostics from this hospitalization (including imaging, microbiology, ancillary and laboratory) are listed below for reference.    Significant Diagnostic Studies: DG Chest Port 1 View  Result Date: 07/17/2020 CLINICAL DATA:  Fever, bilateral leg pain for 2 days EXAM: PORTABLE CHEST 1 VIEW COMPARISON:  Radiograph 05/20/2020 FINDINGS: Streaky opacities in the lung bases with low lung volumes. Normal pulmonary vascularity. No focal consolidative opacity is seen. No pneumothorax or visible effusion. The cardiomediastinal contours are unremarkable. No acute osseous or soft tissue abnormality. Telemetry leads overlie the chest. IMPRESSION: Low volumes with streaky basilar opacities, favored to reflect atelectasis versus early infection. Electronically Signed   By: March Rummage  [facility] M.D.   On: 07/17/2020 16:38   DG Foot Complete Left  Result Date: 07/17/2020 CLINICAL DATA:  Cellulitis, concern for osteomyelitis EXAM: LEFT FOOT - COMPLETE 3+ VIEW COMPARISON:  None. FINDINGS: No fracture or dislocation is seen. The joint spaces are preserved. The visualized soft tissues are unremarkable. No osseous destruction to suggest osteomyelitis. IMPRESSION: No radiographic findings to suggest osteomyelitis. Electronically Signed   By: Julian Hy M.D.   On: 07/17/2020 06:13   DG Foot Complete Right  Result Date: 07/17/2020 CLINICAL DATA:  Cellulitis.  Concern for osteomyelitis. EXAM: RIGHT FOOT COMPLETE - 3+ VIEW COMPARISON:  Right foot radiographs 05/16/2020 FINDINGS: Forefoot amputation at the level of the metatarsals is noted. Soft tissue swelling is present anterior to the osteotomy sites. Residual metatarsals are within normal limits. No osseous erosion is present. No gas is present in the soft tissues. Midfoot and hindfoot are within normal limits.  IMPRESSION: 1. Forefoot amputation without radiographic evidence for osteomyelitis. 2. Soft tissue swelling anterior to the osteotomy sites. Electronically Signed   By: San Morelle M.D.   On: 07/17/2020 06:13    Microbiology: Recent Results (from the past 240 hour(s))  Blood Culture (routine x 2)     Status: None   Collection Time: 07/17/20  5:29 AM   Specimen: BLOOD  Result Value Ref Range Status   Specimen Description BLOOD LEFT [facility]  Final   Special Requests   Final    BOTTLES DRAWN AEROBIC AND ANAEROBIC Blood Culture adequate volume   Culture   Final    NO GROWTH 5 DAYS Performed at [facility], [address], [place], [place] [address]    Report Status 07/22/2020 FINAL  Final  Blood Culture (routine x 2)     Status: None   Collection Time: 07/17/20  5:30 AM   Specimen: BLOOD  Result Value Ref Range Status   Specimen Description BLOOD RIGHT [facility]  Final   Special Requests   Final    BOTTLES DRAWN AEROBIC AND ANAEROBIC Blood Culture adequate volume   Culture   Final    NO GROWTH 5 DAYS Performed at [facility], [address] [place], [place] [address]    Report Status 07/22/2020 FINAL  Final  Respiratory Panel by RT PCR (Flu A&B, Covid) - Nasopharyngeal Swab     Status: None   Collection Time: 07/17/20  8:30 AM   Specimen: Nasopharyngeal Swab  Result Value Ref Range Status   SARS Coronavirus 2 by RT PCR NEGATIVE NEGATIVE Final    Comment: (NOTE) SARS-CoV-2 target nucleic acids are NOT DETECTED.  The SARS-CoV-2 RNA is generally detectable in upper respiratoy specimens during the acute phase of infection. The lowest concentration of SARS-CoV-2 viral copies this assay can detect is 131 copies/mL. A negative result does not preclude SARS-Cov-2 infection and should not be used as the sole basis for treatment or other patient management decisions. A negative result may occur with  improper specimen collection/handling, submission of specimen  other than nasopharyngeal swab, presence of viral mutation(s) within the areas targeted by this assay, and inadequate number of viral copies (<131 copies/mL). A negative result must be combined with clinical observations, patient history, and epidemiological information. The expected result is Negative.  Fact Sheet for Patients:  PinkCheek.be  Fact Sheet for Healthcare Providers:  GravelBags.it  This test is no t yet approved or cleared by the [place] FDA and  has been authorized for detection and/or diagnosis of SARS-CoV-2 by FDA under  an Emergency Use Authorization (EUA). This EUA will remain  in effect (meaning this test can be used) for the duration of the COVID-19 declaration under Section 564(b)(1) of the Act, 21 U.S.C. section 360bbb-3(b)(1), unless the authorization is terminated or revoked sooner.     Influenza A by PCR NEGATIVE NEGATIVE Final   Influenza B by PCR NEGATIVE NEGATIVE Final    Comment: (NOTE) The Xpert Xpress SARS-CoV-2/FLU/RSV assay is intended as an aid in  the diagnosis of influenza from Nasopharyngeal swab specimens and  should not be used as a sole basis for treatment. Nasal washings and  aspirates are unacceptable for Xpert Xpress SARS-CoV-2/FLU/RSV  testing.  Fact Sheet for Patients: PinkCheek.be  Fact Sheet for Healthcare Providers: GravelBags.it  This test is not yet approved or cleared by the [place] FDA and  has been authorized for detection and/or diagnosis of SARS-CoV-2 by  FDA under an Emergency Use Authorization (EUA). This EUA will remain  in effect (meaning this test can be used) for the duration of the  Covid-19 declaration under Section 564(b)(1) of the Act, 21  U.S.C. section 360bbb-3(b)(1), unless the authorization is  terminated or revoked. Performed at [facility], [address]  [place], [place] [address]   MRSA PCR Screening     Status: None   Collection Time: 07/18/20 12:30 AM   Specimen: Nasopharyngeal  Result Value Ref Range Status   MRSA by PCR NEGATIVE NEGATIVE Final    Comment:        The GeneXpert MRSA Assay (FDA approved for NASAL specimens only), is one component of a comprehensive MRSA colonization surveillance program. It is not intended to diagnose MRSA infection nor to guide or monitor treatment for MRSA infections. Performed at [facility], [address], [place], [place] [address]   Aerobic Culture (superficial specimen)     Status: None   Collection Time: 07/22/20  4:55 PM   Specimen: Ulcer  Result Value Ref Range Status   Specimen Description   Final    ULCER Performed at [facility], [address] [place], [place] [address]    Special Requests   Final    NONE Performed at [facility], [address] [place], [place] [address]    Gram Stain   Final    RARE WBC PRESENT, PREDOMINANTLY PMN RARE GRAM NEGATIVE RODS Performed at [facility], [place] [address], [place],  [address]    Culture FEW PSEUDOMONAS AERUGINOSA  Final   Report Status 07/25/2020 FINAL  Final   Organism ID, Bacteria PSEUDOMONAS AERUGINOSA  Final      Susceptibility   Pseudomonas aeruginosa - MIC*    CEFTAZIDIME 4 SENSITIVE Sensitive     CIPROFLOXACIN <=0.25 SENSITIVE Sensitive     GENTAMICIN <=1 SENSITIVE Sensitive     IMIPENEM 2 SENSITIVE Sensitive     PIP/TAZO 16 SENSITIVE Sensitive     CEFEPIME 4 SENSITIVE Sensitive     * FEW PSEUDOMONAS AERUGINOSA  Aerobic Culture (superficial specimen)     Status: None   Collection Time: 07/22/20  4:56 PM   Specimen: Wound  Result Value Ref Range Status   Specimen Description WOUND  Final   Special Requests LEFT CALF ULCER  Final   Gram Stain   Final    FEW WBC PRESENT, PREDOMINANTLY PMN NO ORGANISMS SEEN    Culture   Final    FEW NORMAL SKIN FLORA Performed at [facility], [place] [address]  [place], [place] [address]    Report Status 07/25/2020 FINAL  Final     Labs: CBC: Recent Labs  Lab 07/20/20 0528 07/22/20 0413 07/23/20 0555 07/24/20 0413 07/25/20 0519  WBC 12.9* 13.5* 18.0* 14.3* 9.9  HGB 8.9* 10.1* 10.3* 9.7* 9.5*  HCT 30.5* 34.6* 35.1* 32.4* 32.0*  MCV 78.4* 77.9* 77.7* 78.3* 78.4*  PLT 299 355 358 351 701   Basic Metabolic Panel: Recent Labs  Lab 07/20/20 0528 07/21/20 0342 07/22/20 0413 07/24/20 0413  NA  --   --  138 135  K  --   --  3.8 3.6  CL  --   --  103 104  CO2  --   --  24 24  GLUCOSE  --   --  102* 106*  BUN  --   --  16 21*  CREATININE 0.68 0.72 0.83 0.74  CALCIUM  --   --  9.1 8.6*   Liver Function Tests: No results for input(s): AST, ALT, ALKPHOS, BILITOT, PROT, ALBUMIN in the last 168 hours. CBG: No results for input(s): GLUCAP in the last 168 hours.  Time spent: 35 minutes  Signed:  Berle Mull  [facility] 07/25/2020 6:13 PM

## 2020-08-12 DIAGNOSIS — S81802A Unspecified open wound, left lower leg, initial encounter: Secondary | ICD-10-CM | POA: Insufficient documentation

## 2020-08-12 DIAGNOSIS — D649 Anemia, unspecified: Secondary | ICD-10-CM | POA: Insufficient documentation

## 2020-08-12 DIAGNOSIS — I96 Gangrene, not elsewhere classified: Secondary | ICD-10-CM | POA: Insufficient documentation

## 2020-08-13 DIAGNOSIS — I2699 Other pulmonary embolism without acute cor pulmonale: Secondary | ICD-10-CM | POA: Insufficient documentation

## 2020-08-31 ENCOUNTER — Emergency Department
Admission: EM | Admit: 2020-08-31 | Discharge: 2020-08-31 | Disposition: A | Discharge status: Home / Self Care | Payer: 59 | Attending: Emergency Medicine | Admitting: Emergency Medicine

## 2020-08-31 ENCOUNTER — Other Ambulatory Visit: Payer: Self-pay

## 2020-08-31 ENCOUNTER — Encounter: Payer: Self-pay | Admitting: Emergency Medicine

## 2020-08-31 DIAGNOSIS — H1032 Unspecified acute conjunctivitis, left eye: Secondary | ICD-10-CM | POA: Insufficient documentation

## 2020-08-31 DIAGNOSIS — H579 Unspecified disorder of eye and adnexa: Secondary | ICD-10-CM | POA: Diagnosis present

## 2020-08-31 DIAGNOSIS — Z79899 Other long term (current) drug therapy: Secondary | ICD-10-CM | POA: Insufficient documentation

## 2020-08-31 DIAGNOSIS — I10 Essential (primary) hypertension: Secondary | ICD-10-CM | POA: Diagnosis not present

## 2020-08-31 DIAGNOSIS — F17210 Nicotine dependence, cigarettes, uncomplicated: Secondary | ICD-10-CM | POA: Diagnosis not present

## 2020-08-31 MED ORDER — KETOROLAC TROMETHAMINE 0.5 % OP SOLN: 1.0000 [drp] | Freq: Four times a day (QID) | OPHTHALMIC | 0 refills | Status: DC

## 2020-08-31 MED ORDER — FLUORESCEIN SODIUM 1 MG OP STRP
1.0000 | ORAL_STRIP | Freq: Once | OPHTHALMIC | Status: AC
  Administered 2020-08-31: 1 via OPHTHALMIC
  Filled 2020-08-31: qty 1

## 2020-08-31 MED ORDER — EYE WASH OPHTH SOLN
1.0000 [drp] | OPHTHALMIC | Status: DC | PRN
  Administered 2020-08-31: 1 [drp] via OPHTHALMIC
  Filled 2020-08-31: qty 118

## 2020-08-31 MED ORDER — TETRACAINE HCL 0.5 % OP SOLN
1.0000 [drp] | Freq: Once | OPHTHALMIC | Status: AC
  Administered 2020-08-31: 1 [drp] via OPHTHALMIC
  Filled 2020-08-31: qty 4

## 2020-08-31 NOTE — ED Provider Notes (Signed)
[facility] Emergency Department Provider Note   ____________________________________________   First MD Initiated Contact with Patient 08/31/20 [phone]     (approximate)  I have reviewed the triage vital signs and the nursing notes.   HISTORY  Chief Complaint Eye Drainage   HPI Regina Carpenter is a 44 y.o. female presents to the ED with complaint of clear watery drainage from her left eye and discomfort that she is experienced before because of her rheumatoid arthritis.  She states last time this happened was 2017 and she was given some eyedrops.  She has an appointment with Dr.Kernodle  who is her rheumatologist tomorrow.  She denies any vision changes.  There has been no trauma to her eye and she does not have any sensation of a foreign body.  She has been seen also by [facility] who told there that this is irritation from her rheumatoid arthritis.    Past Medical History:  Diagnosis Date  . Arthralgia of right knee   . Arthritis   . Cellulitis of left leg   . Collagen vascular disease ([place])   . Lymphadenopathy, mesenteric   . Right hand pain   . Tobacco abuse     Patient Active Problem List   Diagnosis Date Noted  . Bilateral lower leg cellulitis 07/17/2020  . 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 Rheumatoid arthritis ([place]) 05/17/2020  . 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 Essential hypertension 05/17/2020  . Smoking 05/17/2020  . Sepsis ([place]) 05/16/2020  . Hyponatremia 11/25/2016  . Hypokalemia 11/25/2016  . Leukocytosis 11/25/2016  . Generalized weakness 11/25/2016  . Generalized pain 11/25/2016  . Conjunctivitis 11/25/2016  . Scleritis and episcleritis of right eye   . 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 Abdominal pain 11/14/2016  . Lymphadenopathy, mesenteric   . Arthralgia of right knee 04/02/2015  . Cellulitis of left leg 03/21/2015  . Right hand pain 03/21/2015  . Tobacco use 03/21/2015     Past Surgical History:  Procedure Laterality Date  . denies    . LOWER EXTREMITY ANGIOGRAPHY Bilateral 05/19/2020   Procedure: Lower Extremity Angiography;  Surgeon: Algernon Huxley, MD;  Location: [place] [facility];  Service: Cardiovascular;  Laterality: Bilateral;  . 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 Authorizing Provider  amLODipine (NORVASC) 5 MG tablet Take 1 tablet (5 mg total) by mouth daily. 07/25/20   Lavina Hamman, MD  collagenase (SANTYL) ointment Apply topically daily. 07/25/20   Lavina Hamman, MD  cyclophosphamide (CYTOXAN) 50 MG capsule Take 2 capsules (100 mg total) by mouth daily. Take with food to minimize GI upset. Take early in the day and maintain hydration. 07/24/20   Lavina Hamman, MD  famotidine (PEPCID) 20 MG tablet Take 1 tablet (20 mg total) by mouth daily. 07/25/20   Lavina Hamman, MD  gabapentin (NEURONTIN) 600 MG tablet Take 0.5 tablets (300 mg total) by mouth 3 (three) times daily. 07/24/20   Lavina Hamman, MD  ketorolac (ACULAR) 0.5 % ophthalmic solution Place 1 drop into the left eye 4 (four) times daily. 08/31/20   Johnn Hai, PA-C  nicotine (NICODERM CQ - DOSED IN MG/24 HOURS) 14 mg/24hr patch Place 1 patch (14 mg total) onto the skin daily. 07/25/20  Lavina Hamman, MD  oxyCODONE-acetaminophen (PERCOCET) 5-325 MG tablet Take 1 tablet by mouth every 6 (six) hours as needed for severe pain. 07/25/20   Lavina Hamman, MD  predniSONE (DELTASONE) 10 MG tablet Take 50mg  daily for 3days,Take 40mg  daily for 3days,Take 30mg  daily for 3days,Take 20mg  daily 07/24/20   Lavina Hamman, MD  saccharomyces boulardii (FLORASTOR) 250 MG capsule Take 1 capsule (250 mg total) by mouth 2 (two) times daily. 07/25/20   Lavina Hamman, MD     Allergies Ibuprofen and Sulfasalazine  Family History  Problem Relation Age of Onset  . Leukemia Maternal Grandfather   . Alcohol abuse Father     Social History Social History   Tobacco Use  . Smoking status: Current Every Day Smoker    Packs/day: 0.50    Types: Cigarettes  . Smokeless tobacco: Never Used  Vaping Use  . Vaping Use: Never used  Substance Use Topics  . Alcohol use: No  . Drug use: No    Review of Systems Constitutional: No fever/chills Eyes: No visual changes.  Positive for left eye clear drainage. ENT: No sore throat. Cardiovascular: Denies chest pain. Respiratory: Denies shortness of breath. Gastrointestinal: No abdominal pain.  No nausea, no vomiting.  No diarrhea.  . Genitourinary: Negative for dysuria. Musculoskeletal: Negative for muscle skeletal pain. Skin: Negative for rash. Neurological: Negative for headaches, focal weakness or numbness.  ____________________________________________   PHYSICAL EXAM:  VITAL SIGNS: ED Triage Vitals  Enc Vitals Group     BP 08/31/20 0801 120/87     Pulse Rate 08/31/20 0801 (!) 110     Resp 08/31/20 0801 20     Temp 08/31/20 0759 99.1 F (37.3 C)     Temp Source 08/31/20 0759 Oral     SpO2 08/31/20 0801 100 %     Weight 08/31/20 0759 218 lb (98.9 kg)     Height 08/31/20 0759 5\' 5"  (1.651 m)     Head Circumference --      Peak Flow --      Pain Score 08/31/20 0759 10     Pain Loc --      Pain Edu? --      Excl. in [place]? --     Constitutional: Alert and oriented. Well appearing and in no acute distress. Eyes: Conjunctivae are normal on the right.  Conjunctive on the left is minimally injected with clear tears noted during exam.  Upper lid was everted and no foreign body was noted.  Fluorescein stain was applied after tetracaine.  No corneal abrasions were seen.  Eye was irrigated with eyewash solution.  Visual acuity was noted.  PERRL. EOMI. Head: Atraumatic. Nose: No  congestion/rhinnorhea. Mouth/Throat: Mucous membranes are moist.   Neck: No stridor.   Cardiovascular: Normal rate, regular rhythm. Grossly normal heart sounds.  Good peripheral circulation. Respiratory: Normal respiratory effort.  No retractions. Lungs CTAB. Neurologic:  Normal speech and language. No gross focal neurologic deficits are appreciated. No gait instability. Skin:  Skin is warm, dry and intact.  Psychiatric: Mood and affect are normal. Speech and behavior are normal.  ____________________________________________   LABS (all labs ordered are listed, but only abnormal results are displayed)  Labs Reviewed - No data to display   PROCEDURES  Procedure(s) performed (including Critical Care):  Procedures   ____________________________________________   INITIAL IMPRESSION / ASSESSMENT AND PLAN / ED COURSE  As part of my medical decision making, I reviewed the following data within the electronic medical  record:  Notes from prior ED visits and [place] Controlled Substance Database  44 year old female presents to the ED with complaint of left eye watering since last evening.  Patient states that she is encountering this 1 other time and was told that this is due to her rheumatoid arthritis.  She states she has an appointment with Dr. Jefm Bryant tomorrow who is her rheumatologist but she did not think that she could tolerate another day of her eye watering.  She denies any foreign body sensation and exam was benign.  Visual acuity was noted.  Patient was given a prescription for Acular 1 drop 4 times a day.  She is encouraged to keep her appointment with Dr. Jefm Bryant tomorrow for further evaluation and also referral to [facility] if needed however she states that she has seen them in the past and they generally write prescription for eyedrops also. ____________________________________________   FINAL CLINICAL IMPRESSION(S) / ED DIAGNOSES  Final diagnoses:  Acute  conjunctivitis of left eye, unspecified acute conjunctivitis type     ED Discharge Orders         Ordered    ketorolac (ACULAR) 0.5 % ophthalmic solution  4 times daily        08/31/20 0916          *Please note:  Syanna Remmert was evaluated in Emergency Department on 08/31/2020 for the symptoms described in the history of present illness. She was evaluated in the context of the global COVID-19 pandemic, which necessitated consideration that the patient might be at risk for infection with the SARS-CoV-2 virus that causes COVID-19. Institutional protocols and algorithms that pertain to the evaluation of patients at risk for COVID-19 are in a state of rapid change based on information released by regulatory bodies including the CDC and federal and state organizations. These policies and algorithms were followed during the patient's care in the ED.  Some ED evaluations and interventions may be delayed as a result of limited staffing during and the pandemic.*   Note:  This document was prepared using Dragon voice recognition software and may include unintentional dictation errors.    Johnn Hai, PA-C 08/31/20 1241    Blake Divine, MD 09/01/20 [phone]

## 2020-08-31 NOTE — Discharge Instructions (Signed)
Keep your appointment with Dr. Jefm Bryant tomorrow.  Begin using the Acular ophthalmic drops 1 drop to left eye 4 times a day.  Avoid rubbing your eye to prevent increased swelling of your eyelid as well as preventing an abrasion to your eye.  You may also need to follow-up with [facility] if any continued problems.

## 2020-08-31 NOTE — ED Triage Notes (Signed)
Pt reports yesterday she started with pain and watering to her left eye. Pt denies injuries and reports it has been nonstop

## 2020-09-05 ENCOUNTER — Other Ambulatory Visit: Payer: Self-pay

## 2020-09-05 ENCOUNTER — Encounter: Payer: Self-pay | Admitting: Emergency Medicine

## 2020-09-05 ENCOUNTER — Emergency Department
Admission: EM | Admit: 2020-09-05 | Discharge: 2020-09-05 | Disposition: A | Discharge status: Home / Self Care | Payer: 59 | Attending: Emergency Medicine | Admitting: Emergency Medicine

## 2020-09-05 ENCOUNTER — Emergency Department: Payer: 59

## 2020-09-05 DIAGNOSIS — F17210 Nicotine dependence, cigarettes, uncomplicated: Secondary | ICD-10-CM | POA: Diagnosis not present

## 2020-09-05 DIAGNOSIS — R079 Chest pain, unspecified: Secondary | ICD-10-CM | POA: Insufficient documentation

## 2020-09-05 DIAGNOSIS — Z79899 Other long term (current) drug therapy: Secondary | ICD-10-CM | POA: Diagnosis not present

## 2020-09-05 DIAGNOSIS — I10 Essential (primary) hypertension: Secondary | ICD-10-CM | POA: Diagnosis not present

## 2020-09-05 LAB — CBC
HCT: 36.5 % (ref 36.0–46.0)
Hemoglobin: 11 g/dL — ABNORMAL LOW (ref 12.0–15.0)
MCH: 22 pg — ABNORMAL LOW (ref 26.0–34.0)
MCHC: 30.1 g/dL (ref 30.0–36.0)
MCV: 73.1 fL — ABNORMAL LOW (ref 80.0–100.0)
Platelets: 325 10*3/uL (ref 150–400)
RBC: 4.99 MIL/uL (ref 3.87–5.11)
RDW: 17.8 % — ABNORMAL HIGH (ref 11.5–15.5)
WBC: 13.8 10*3/uL — ABNORMAL HIGH (ref 4.0–10.5)
nRBC: 0 % (ref 0.0–0.2)

## 2020-09-05 LAB — BASIC METABOLIC PANEL
Anion gap: 12 (ref 5–15)
BUN: 9 mg/dL (ref 6–20)
CO2: 22 mmol/L (ref 22–32)
Calcium: 9.3 mg/dL (ref 8.9–10.3)
Chloride: 100 mmol/L (ref 98–111)
Creatinine, Ser: 0.8 mg/dL (ref 0.44–1.00)
GFR, Estimated: >60 mL/min (ref >60)
Glucose, Bld: 102 mg/dL — ABNORMAL HIGH (ref 70–99)
Potassium: 3.3 mmol/L — ABNORMAL LOW (ref 3.5–5.1)
Sodium: 134 mmol/L — ABNORMAL LOW (ref 135–145)

## 2020-09-05 LAB — TROPONIN I (HIGH SENSITIVITY): Troponin I (High Sensitivity): 4 ng/L (ref <18)

## 2020-09-05 NOTE — ED Provider Notes (Signed)
[facility] Emergency Department Provider Note  Time seen: 11:04 AM  I have reviewed the triage vital signs and the nursing notes.   HISTORY  Chief Complaint Chest Pain   HPI Regina Carpenter is a 44 y.o. female with a past medical history of arthritis, generalized pain, presents to the emergency department with complaints of chest pain.  According to the patient for the past 3 to 4 days she has been experiencing intermittent pain in the chest.  States it is mild and dull type pain.  Denies any shortness of breath cough or fever.  No nausea vomiting or diaphoresis.  Denies any trouble breathing.  Denies any cardiac history in the past.   Past Medical History:  Diagnosis Date  . Arthralgia of right knee   . Arthritis   . Cellulitis of left leg   . Collagen vascular disease ([place])   . Lymphadenopathy, mesenteric   . Right hand pain   . Tobacco abuse     Patient Active Problem List   Diagnosis Date Noted  . Bilateral lower leg cellulitis 07/17/2020  . 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 Rheumatoid arthritis ([place]) 05/17/2020  . 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 Essential hypertension 05/17/2020  . Smoking 05/17/2020  . Sepsis ([place]) 05/16/2020  . Hyponatremia 11/25/2016  . Hypokalemia 11/25/2016  . Leukocytosis 11/25/2016  . Generalized weakness 11/25/2016  . Generalized pain 11/25/2016  . Conjunctivitis 11/25/2016  . Scleritis and episcleritis of right eye   . 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 Abdominal pain 11/14/2016  . Lymphadenopathy, mesenteric   . Arthralgia of right knee 04/02/2015  . Cellulitis of left leg 03/21/2015  . Right hand pain 03/21/2015  . Tobacco use 03/21/2015    Past Surgical History:  Procedure Laterality Date  . denies    . LOWER EXTREMITY ANGIOGRAPHY Bilateral 05/19/2020   Procedure: Lower Extremity Angiography;  Surgeon: Algernon Huxley, MD;  Location: [place] [facility];  Service:  Cardiovascular;  Laterality: Bilateral;  . 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 Authorizing Provider  amLODipine (NORVASC) 5 MG tablet Take 1 tablet (5 mg total) by mouth daily. 07/25/20   Lavina Hamman, MD  collagenase (SANTYL) ointment Apply topically daily. 07/25/20   Lavina Hamman, MD  cyclophosphamide (CYTOXAN) 50 MG capsule Take 2 capsules (100 mg total) by mouth daily. Take with food to minimize GI upset. Take early in the day and maintain hydration. 07/24/20   Lavina Hamman, MD  famotidine (PEPCID) 20 MG tablet Take 1 tablet (20 mg total) by mouth daily. 07/25/20   Lavina Hamman, MD  gabapentin (NEURONTIN) 600 MG tablet Take 0.5 tablets (300 mg total) by mouth 3 (three) times daily. 07/24/20   Lavina Hamman, MD  ketorolac (ACULAR) 0.5 % ophthalmic solution Place 1 drop into the left eye 4 (four) times daily. 08/31/20   Johnn Hai, PA-C  nicotine (NICODERM CQ - DOSED IN MG/24 HOURS) 14 mg/24hr patch Place 1 patch (14 mg total) onto the skin daily. 07/25/20   Lavina Hamman, MD  oxyCODONE-acetaminophen (PERCOCET) 5-325 MG tablet Take 1 tablet by mouth every 6 (six) hours as needed for severe pain. 07/25/20   Lavina Hamman, MD  predniSONE (DELTASONE) 10 MG tablet Take  50mg  daily for 3days,Take 40mg  daily for 3days,Take 30mg  daily for 3days,Take 20mg  daily 07/24/20   Lavina Hamman, MD  saccharomyces boulardii (FLORASTOR) 250 MG capsule Take 1 capsule (250 mg total) by mouth 2 (two) times daily. 07/25/20   Lavina Hamman, MD    Allergies  Allergen Reactions  . Ibuprofen Other (See Comments)    Pt states that she sweats profusely after taking ibuprofen Other reaction(s): Other (See Comments) "sweating:"  . Sulfasalazine Rash    Family History  Problem  Relation Age of Onset  . Leukemia Maternal Grandfather   . Alcohol abuse Father     Social History Social History   Tobacco Use  . Smoking status: Current Every Day Smoker    Packs/day: 0.50    Types: Cigarettes  . Smokeless tobacco: Never Used  Vaping Use  . Vaping Use: Never used  Substance Use Topics  . Alcohol use: No  . Drug use: No    Review of Systems Constitutional: Negative for fever. Cardiovascular: Mild intermittent chest pain x3 to 4 days. Respiratory: Negative for shortness of breath. Gastrointestinal: Negative for abdominal pain, vomiting Musculoskeletal: Negative for musculoskeletal complaints Neurological: Negative for headache All other ROS negative  ____________________________________________   PHYSICAL EXAM:  VITAL SIGNS: ED Triage Vitals  Enc Vitals Group     BP --      Pulse --      Resp --      Temp --      Temp src --      SpO2 --      Weight 09/05/20 0830 217 lb 13 oz (98.8 kg)     Height 09/05/20 0830 5\' 5"  (1.651 m)     Head Circumference --      Peak Flow --      Pain Score 09/05/20 0829 10     Pain Loc --      Pain Edu? --      Excl. in [place]? --    Constitutional: Alert and oriented. Well appearing and in no distress. Eyes: Normal exam ENT      Head: Normocephalic and atraumatic.      Mouth/Throat: Mucous membranes are moist. Cardiovascular: Normal rate, regular rhythm.  Respiratory: Normal respiratory effort without tachypnea nor retractions. Breath sounds are clear Gastrointestinal: Soft and nontender. No distention. Musculoskeletal: Nontender with normal range of motion in all extremities. No lower extremity edema. Neurologic:  Normal speech and language. No gross focal neurologic deficits Skin:  Skin is warm, dry and intact.  Psychiatric: Mood and affect are normal.   ____________________________________________    EKG  EKG viewed and interpreted by myself shows sinus tachycardia 104 bpm with a narrow QRS, normal  axis, normal intervals, no concerning ST changes.  ____________________________________________    RADIOLOGY  Chest x-ray is normal.  ____________________________________________   INITIAL IMPRESSION / ASSESSMENT AND PLAN / ED COURSE  Pertinent labs & imaging results that were available during my care of the patient were reviewed by me and considered in my medical decision making (see chart for details).   Patient presents to the emergency department for intermittent chest pain over the past 3 to 4 days.  Overall the patient appears well, lab work is overall reassuring including a negative troponin.  Slight white blood cell count elevation, normal chest x-ray and reassuring EKG.  Patient denies any infectious symptoms.  Given the patient's chest pain and negative work-up I believe the patient is safe for discharge home however given her  intermittent chest pain and comorbidities I discussed with the patient follow-up with cardiology for a stress test.  Patient agreeable to plan of care.  Discussed my typical chest pain return precautions.  Regina Carpenter was evaluated in Emergency Department on 09/05/2020 for the symptoms described in the history of present illness. She was evaluated in the context of the global COVID-19 pandemic, which necessitated consideration that the patient might be at risk for infection with the SARS-CoV-2 virus that causes COVID-19. Institutional protocols and algorithms that pertain to the evaluation of patients at risk for COVID-19 are in a state of rapid change based on information released by regulatory bodies including the CDC and federal and state organizations. These policies and algorithms were followed during the patient's care in the ED.  ____________________________________________   FINAL CLINICAL IMPRESSION(S) / ED DIAGNOSES  Chest pain   Harvest Dark, MD 09/05/20 1107

## 2020-09-05 NOTE — ED Triage Notes (Signed)
Says chest pain for 3 days.

## 2021-02-10 ENCOUNTER — Other Ambulatory Visit: Payer: Self-pay

## 2021-02-10 ENCOUNTER — Emergency Department
Admission: EM | Admit: 2021-02-10 | Discharge: 2021-02-10 | Disposition: A | Discharge status: Home / Self Care | Payer: 59 | Attending: Emergency Medicine | Admitting: Emergency Medicine

## 2021-02-10 DIAGNOSIS — M791 Myalgia, unspecified site: Secondary | ICD-10-CM | POA: Diagnosis not present

## 2021-02-10 DIAGNOSIS — R112 Nausea with vomiting, unspecified: Secondary | ICD-10-CM | POA: Diagnosis not present

## 2021-02-10 DIAGNOSIS — F17210 Nicotine dependence, cigarettes, uncomplicated: Secondary | ICD-10-CM | POA: Insufficient documentation

## 2021-02-10 DIAGNOSIS — R0981 Nasal congestion: Secondary | ICD-10-CM | POA: Diagnosis not present

## 2021-02-10 DIAGNOSIS — Z20822 Contact with and (suspected) exposure to covid-19: Secondary | ICD-10-CM | POA: Diagnosis not present

## 2021-02-10 DIAGNOSIS — I10 Essential (primary) hypertension: Secondary | ICD-10-CM | POA: Insufficient documentation

## 2021-02-10 DIAGNOSIS — R509 Fever, unspecified: Secondary | ICD-10-CM | POA: Diagnosis present

## 2021-02-10 DIAGNOSIS — Z79899 Other long term (current) drug therapy: Secondary | ICD-10-CM | POA: Insufficient documentation

## 2021-02-10 DIAGNOSIS — R52 Pain, unspecified: Secondary | ICD-10-CM

## 2021-02-10 LAB — CBC
HCT: 44.4 % (ref 36.0–46.0)
Hemoglobin: 13.9 g/dL (ref 12.0–15.0)
MCH: 27.9 pg (ref 26.0–34.0)
MCHC: 31.3 g/dL (ref 30.0–36.0)
MCV: 89 fL (ref 80.0–100.0)
Platelets: 259 10*3/uL (ref 150–400)
RBC: 4.99 MIL/uL (ref 3.87–5.11)
RDW: 17.8 % — ABNORMAL HIGH (ref 11.5–15.5)
WBC: 9.9 10*3/uL (ref 4.0–10.5)
nRBC: 0 % (ref 0.0–0.2)

## 2021-02-10 LAB — COMPREHENSIVE METABOLIC PANEL
ALT: 16 U/L (ref 0–44)
AST: 20 U/L (ref 15–41)
Albumin: 3.9 g/dL (ref 3.5–5.0)
Alkaline Phosphatase: 47 U/L (ref 38–126)
Anion gap: 12 (ref 5–15)
BUN: 13 mg/dL (ref 6–20)
CO2: 22 mmol/L (ref 22–32)
Calcium: 9.5 mg/dL (ref 8.9–10.3)
Chloride: 103 mmol/L (ref 98–111)
Creatinine, Ser: 0.85 mg/dL (ref 0.44–1.00)
GFR, Estimated: >60 mL/min (ref >60)
Glucose, Bld: 89 mg/dL (ref 70–99)
Potassium: 3.5 mmol/L (ref 3.5–5.1)
Sodium: 137 mmol/L (ref 135–145)
Total Bilirubin: 1.2 mg/dL (ref 0.3–1.2)
Total Protein: 8 g/dL (ref 6.5–8.1)

## 2021-02-10 LAB — RESP PANEL BY RT-PCR (FLU A&B, COVID) ARPGX2
Influenza A by PCR: NEGATIVE
Influenza B by PCR: NEGATIVE
SARS Coronavirus 2 by RT PCR: NEGATIVE

## 2021-02-10 LAB — LIPASE, BLOOD: Lipase: 28 U/L (ref 11–51)

## 2021-02-10 MED ORDER — ONDANSETRON HCL 4 MG/2ML IJ SOLN
4.0000 mg | Freq: Once | INTRAMUSCULAR | Status: AC
  Administered 2021-02-10: 4 mg via INTRAVENOUS
  Filled 2021-02-10: qty 2

## 2021-02-10 MED ORDER — ONDANSETRON 4 MG PO TBDP: 4.0000 mg | ORAL_TABLET | Freq: Three times a day (TID) | ORAL | 0 refills | Status: DC | PRN

## 2021-02-10 MED ORDER — ACETAMINOPHEN 325 MG PO TABS
650.0000 mg | ORAL_TABLET | Freq: Once | ORAL | Status: AC
  Administered 2021-02-10: 650 mg via ORAL
  Filled 2021-02-10: qty 2

## 2021-02-10 MED ORDER — SODIUM CHLORIDE 0.9 % IV BOLUS
1000.0000 mL | Freq: Once | INTRAVENOUS | Status: AC
  Administered 2021-02-10: 1000 mL via INTRAVENOUS

## 2021-02-10 NOTE — ED Provider Notes (Signed)
[facility] Emergency Department Provider Note   ____________________________________________   Event Date/Time   First MD Initiated Contact with Patient 02/10/21 [phone]     (approximate)  I have reviewed the triage vital signs and the nursing notes.   HISTORY  Chief Complaint Fever and Emesis    HPI Regina Carpenter is a 45 y.o. female with the below stated past medical history who presents for generalized body aches, nausea vomiting, fever, and congestion for the past 2 days.  Patient states that she has been p.o. intolerant over this time however she states that she has been able to take her medications on time and as prescribed.  Patient denies any recent travel or sick contacts.  Patient denies any food out of the ordinary.  Patient denies any exacerbating or relieving factors for this pain.  Patient currently denies any vision changes, tinnitus, difficulty speaking, facial droop, sore throat, chest pain, shortness of breath, abdominal pain, diarrhea, dysuria, or weakness/numbness/paresthesias in any extremity         Past Medical History:  Diagnosis Date  . Arthralgia of right knee   . Arthritis   . Cellulitis of left leg   . Collagen vascular disease ([place])   . Lymphadenopathy, mesenteric   . Right hand pain   . Tobacco abuse     Patient Active Problem List   Diagnosis Date Noted  . Bilateral lower leg cellulitis 07/17/2020  . 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 Rheumatoid arthritis ([place]) 05/17/2020  . 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 Essential hypertension 05/17/2020  . Smoking 05/17/2020  . Sepsis ([place]) 05/16/2020  . Hyponatremia 11/25/2016  . Hypokalemia 11/25/2016  . Leukocytosis 11/25/2016  . Generalized weakness 11/25/2016  . Generalized pain 11/25/2016  . Conjunctivitis 11/25/2016  . Scleritis and episcleritis of right eye   . 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 Abdominal pain 11/14/2016   . Lymphadenopathy, mesenteric   . Arthralgia of right knee 04/02/2015  . Cellulitis of left leg 03/21/2015  . Right hand pain 03/21/2015  . Tobacco use 03/21/2015    Past Surgical History:  Procedure Laterality Date  . denies    . LOWER EXTREMITY ANGIOGRAPHY Bilateral 05/19/2020   Procedure: Lower Extremity Angiography;  Surgeon: Algernon Huxley, MD;  Location: [place] [facility];  Service: Cardiovascular;  Laterality: Bilateral;  . 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 Authorizing Provider  ondansetron (ZOFRAN ODT) 4 MG disintegrating tablet Take 1 tablet (4 mg total) by mouth every 8 (eight) hours as needed for nausea or vomiting. 02/10/21  Yes Glynda Soliday, Vista Lawman, MD  amLODipine (NORVASC) 5 MG tablet Take 1 tablet (5 mg total) by mouth daily. 07/25/20   Lavina Hamman, MD  collagenase (SANTYL) ointment Apply topically daily. 07/25/20   Lavina Hamman, MD  cyclophosphamide (CYTOXAN) 50 MG capsule Take 2 capsules (100 mg total) by mouth daily. Take with food to minimize GI upset. Take early in the day and maintain hydration. 07/24/20   Lavina Hamman, MD  famotidine (PEPCID) 20 MG tablet Take 1 tablet (20 mg total) by mouth daily. 07/25/20   Lavina Hamman, MD  gabapentin (NEURONTIN) 600 MG tablet Take 0.5 tablets (300 mg total) by mouth 3 (three) times daily. 07/24/20   Posey Pronto,  Josetta Huddle, MD  ketorolac (ACULAR) 0.5 % ophthalmic solution Place 1 drop into the left eye 4 (four) times daily. 08/31/20   Johnn Hai, PA-C  nicotine (NICODERM CQ - DOSED IN MG/24 HOURS) 14 mg/24hr patch Place 1 patch (14 mg total) onto the skin daily. 07/25/20   Lavina Hamman, MD  oxyCODONE-acetaminophen (PERCOCET) 5-325 MG tablet Take 1 tablet by mouth every 6 (six) hours as needed for severe  pain. 07/25/20   Lavina Hamman, MD  predniSONE (DELTASONE) 10 MG tablet Take 50mg  daily for 3days,Take 40mg  daily for 3days,Take 30mg  daily for 3days,Take 20mg  daily 07/24/20   Lavina Hamman, MD  saccharomyces boulardii (FLORASTOR) 250 MG capsule Take 1 capsule (250 mg total) by mouth 2 (two) times daily. 07/25/20   Lavina Hamman, MD    Allergies Ibuprofen and Sulfasalazine  Family History  Problem Relation Age of Onset  . Leukemia Maternal Grandfather   . Alcohol abuse Father     Social History Social History   Tobacco Use  . Smoking status: Current Every Day Smoker    Packs/day: 0.50    Types: Cigarettes  . Smokeless tobacco: Never Used  Vaping Use  . Vaping Use: Never used  Substance Use Topics  . Alcohol use: No  . Drug use: No    Review of Systems Constitutional: Endorses fever/chills Eyes: No visual changes. ENT: No sore throat. Cardiovascular: Denies chest pain. Respiratory: Denies shortness of breath. Gastrointestinal: Endorses nausea/vomiting.  No abdominal pain.  No diarrhea. Genitourinary: Negative for dysuria. Musculoskeletal: Positive for acute generalized arthralgias Skin: Negative for rash. Neurological: Negative for headaches, weakness/numbness/paresthesias in any extremity Psychiatric: Negative for suicidal ideation/homicidal ideation   ____________________________________________   PHYSICAL EXAM:  VITAL SIGNS: ED Triage Vitals  Enc Vitals Group     BP 02/10/21 0927 (!) 140/97     Pulse Rate 02/10/21 0927 (!) 120     Resp 02/10/21 0927 18     Temp 02/10/21 0927 98.2 F (36.8 C)     Temp Source 02/10/21 0927 Oral     SpO2 02/10/21 0927 97 %     Weight 02/10/21 0920 239 lb (108.4 kg)     Height 02/10/21 0920 5\' 5"  (1.651 m)     Head Circumference --      Peak Flow --      Pain Score 02/10/21 0920 10     Pain Loc --      Pain Edu? --      Excl. in [place]? --    Constitutional: Alert and oriented. Well appearing and in no acute  distress. Eyes: Conjunctivae are normal. PERRL. Head: Atraumatic. Nose: No congestion/rhinnorhea. Mouth/Throat: Mucous membranes are moist. Neck: No stridor Cardiovascular: Grossly normal heart sounds.  Good peripheral circulation. Respiratory: Normal respiratory effort.  No retractions. Gastrointestinal: Soft and generalized tenderness to palpation. No distention. Musculoskeletal: No obvious deformities Neurologic:  Normal speech and language. No gross focal neurologic deficits are appreciated. Skin:  Skin is warm and dry. No rash noted. Psychiatric: Mood and affect are normal. Speech and behavior are normal.  ____________________________________________   LABS (all labs ordered are listed, but only abnormal results are displayed)  Labs Reviewed  CBC - Abnormal; Notable for the following components:      Result Value   RDW 17.8 (*)    All other components within normal limits  RESP PANEL BY RT-PCR (FLU A&B, COVID) ARPGX2  LIPASE, BLOOD  COMPREHENSIVE METABOLIC PANEL  URINALYSIS, COMPLETE (UACMP) WITH MICROSCOPIC  POC URINE PREG, ED   PROCEDURES  Procedure(s) performed (including Critical Care):  .1-3 Lead EKG Interpretation Performed by: Naaman Plummer, MD Authorized by: Naaman Plummer, MD     Interpretation: normal     ECG rate:  97   ECG rate assessment: normal     Rhythm: sinus rhythm     Ectopy: none     Conduction: normal       ____________________________________________   INITIAL IMPRESSION / ASSESSMENT AND PLAN / ED COURSE  As part of my medical decision making, I reviewed the following data within the [place] notes reviewed and incorporated, Labs reviewed, EKG interpreted, Old chart reviewed, Radiograph reviewed and Notes from prior ED visits reviewed and incorporated        Patient presents for acute nausea/vomiting The cause of the patients symptoms is not clear, but the patient is overall well appearing and is  suspected to have a transient course of illness.  Given History and Exam there does not appear to be an emergent cause of the symptoms such as small bowel obstruction, coronary syndrome, bowel ischemia, DKA, pancreatitis, appendicitis, other acute abdomen or other emergent problem.  Reassessment: After treatment, the patient is feeling much better, tolerating PO fluids, and shows no signs of dehydration.   Disposition: Discharge home with prompt primary care physician follow up in the next 48 hours. Strict return precautions discussed.      ____________________________________________   FINAL CLINICAL IMPRESSION(S) / ED DIAGNOSES  Final diagnoses:  Non-intractable vomiting with nausea, unspecified vomiting type  Fever, unspecified fever cause  Generalized body aches     ED Discharge Orders         Ordered    ondansetron (ZOFRAN ODT) 4 MG disintegrating tablet  Every 8 hours PRN        02/10/21 1050           Note:  This document was prepared using Dragon voice recognition software and may include unintentional dictation errors.   Naaman Plummer, MD 02/10/21 1115

## 2021-02-10 NOTE — ED Triage Notes (Signed)
Pt c/o body aches with fever congestion, N/V since sunday

## 2021-03-30 ENCOUNTER — Encounter: Payer: Self-pay | Admitting: Oncology

## 2021-03-30 ENCOUNTER — Other Ambulatory Visit: Payer: Self-pay

## 2021-03-30 ENCOUNTER — Inpatient Hospital Stay: Payer: 59

## 2021-03-30 ENCOUNTER — Inpatient Hospital Stay: Payer: 59 | Attending: Oncology | Admitting: Oncology

## 2021-03-30 VITALS — BP 132/97 | HR 100 | Temp 97.8°F | Resp 20 | Wt 241.1 lb

## 2021-03-30 DIAGNOSIS — M300 Polyarteritis nodosa: Secondary | ICD-10-CM | POA: Insufficient documentation

## 2021-03-30 NOTE — Progress Notes (Signed)
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 Jefm Bryant  Date of visit: 03/30/21   History of presenting illness-patient is a 45 year old African-American female who was noted to have bilateral lower extremity ulcers and was following up with podiatry.  These were biopsied and consistent with patient is a 45 year old African-American female who was having vasculitis and patient was referred to rheumatology.  She is currently on oral prednisone, Cytoxan, Eliquis and dapsone.  She has been referred to oncology for consideration of IVIG.    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   Rheumatoid arthritis ([employer]) 05/17/2020   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   Essential hypertension 05/17/2020   Smoking 05/17/2020   Sepsis ([place]) 05/16/2020   Hyponatremia 11/25/2016   Hypokalemia 11/25/2016   Leukocytosis 11/25/2016   Generalized weakness 11/25/2016   Generalized pain 11/25/2016   Conjunctivitis 11/25/2016   Scleritis and episcleritis of right eye    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   Abdominal pain 11/14/2016   Lymphadenopathy, mesenteric    Arthralgia of right knee 04/02/2015   Cellulitis of  left leg 03/21/2015   Right hand pain 03/21/2015   Tobacco use 03/21/2015     Past Medical History:  Diagnosis Date   Arthralgia of right knee    Arthritis    Cellulitis of left leg    Collagen vascular disease (H[facility])    Lymphadenopathy, mesenteric    Right hand pain    Tobacco abuse      Past Surgical History:  Procedure Laterality Date   denies     LOWER EXTREMITY ANGIOGRAPHY Bilateral 05/19/2020   Procedure: Lower Extremity Angiography;  Surgeon: Algernon Huxley, MD;  Location: [facility];  Service: Cardiovascular;  Laterality: Bilateral;   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   Procedure: TRANSMETATARSAL AMPUTATION;  Surgeon: Sharlotte Alamo, DPM;  Location: [facility] OR[facility];  Service: Podiatry;  Laterality: Right;    Social History   Socioeconomic History   Marital status: Single    Spouse name: Not on file   Number of children: Not on file   Years of education: Not on file   Highest education level: Not on file  Occupational History   Not on file  Tobacco Use   Smoking status: Every Day    Packs/day: 0.50    Pack years: 0.00    Types: Cigarettes   Smokeless tobacco: Never  Vaping Use   Vaping Use: Never used  Substance and Sexual Activity   Alcohol use: No   Drug use: No   Sexual activity: Not on file  Other Topics Concern   Not on file  Social History Narrative   Lives at home with father   Social  Determinants of Health   Financial Resource Strain: Not on file  Food Insecurity: Not on file  Transportation Needs: Not on file  Physical Activity: Not on file  Stress: Not on file  Social Connections: Not on file  Intimate Partner Violence: Not on file     Family History  Problem Relation Age of Onset   Leukemia Maternal Grandfather    Alcohol abuse Father      Current Outpatient Medications:    acetaminophen (TYLENOL) 500 MG tablet, Take by mouth., Disp: , Rfl:    amLODipine (NORVASC) 5 MG tablet, Take 1 tablet (5 mg total) by mouth daily.,  Disp: 30 tablet, Rfl: 0   amLODipine (NORVASC) 5 MG tablet, Take by mouth., Disp: , Rfl:    cyclophosphamide (CYTOXAN) 50 MG capsule, Take 2 capsules (100 mg total) by mouth daily. Take with food to minimize GI upset. Take early in the day and maintain hydration., Disp: 60 capsule, Rfl: 0   dapsone 100 MG tablet, Take 100 mg by mouth daily., Disp: , Rfl:    ELIQUIS 5 MG TABS tablet, Take 5 mg by mouth 2 (two) times daily., Disp: , Rfl:    Lidocaine 3.75 % CREA, Apply topically., Disp: , Rfl:    nicotine (NICODERM CQ - DOSED IN MG/24 HOURS) 14 mg/24hr patch, Place 1 patch (14 mg total) onto the skin daily., Disp: 28 patch, Rfl: 0   nicotine (NICODERM CQ - DOSED IN MG/24 HOURS) 14 mg/24hr patch, Place onto the skin., Disp: , Rfl:    ondansetron (ZOFRAN ODT) 4 MG disintegrating tablet, Take 1 tablet (4 mg total) by mouth every 8 (eight) hours as needed for nausea or vomiting., Disp: 30 tablet, Rfl: 0   Oxycodone HCl 10 MG TABS, Take by mouth., Disp: , Rfl:    predniSONE (DELTASONE) 10 MG tablet, Take 50mg  daily for 3days,Take 40mg  daily for 3days,Take 30mg  daily for 3days,Take 20mg  daily, Disp: 60 tablet, Rfl: 0   saccharomyces boulardii (FLORASTOR) 250 MG capsule, Take 1 capsule (250 mg total) by mouth 2 (two) times daily., Disp: 60 capsule, Rfl: 0   silver sulfADIAZINE (SILVADENE) 1 % cream, Apply topically daily., Disp: , Rfl:    collagenase (SANTYL) ointment, Apply topically daily. (Patient not taking: Reported on 03/30/2021), Disp: 15 g, Rfl: 0   cyclophosphamide (CYTOXAN) 25 MG capsule, Take by mouth. (Patient not taking: Reported on 03/30/2021), Disp: , Rfl:    diclofenac Sodium (VOLTAREN) 1 % GEL, Apply topically. (Patient not taking: Reported on 03/30/2021), Disp: , Rfl:    famotidine (PEPCID) 20 MG tablet, Take 1 tablet (20 mg total) by mouth daily. (Patient not taking: Reported on 03/30/2021), Disp: 30 tablet, Rfl: 0   famotidine (PEPCID) 20 MG tablet, Take by mouth. (Patient not taking:  Reported on 03/30/2021), Disp: , Rfl:    gabapentin (NEURONTIN) 600 MG tablet, Take 0.5 tablets (300 mg total) by mouth 3 (three) times daily. (Patient not taking: Reported on 03/30/2021), Disp: 45 tablet, Rfl: 0   ketorolac (ACULAR) 0.5 % ophthalmic solution, Place 1 drop into the left eye 4 (four) times daily. (Patient not taking: Reported on 03/30/2021), Disp: 5 mL, Rfl: 0   oxyCODONE-acetaminophen (PERCOCET) 5-325 MG tablet, Take 1 tablet by mouth every 6 (six) hours as needed for severe pain. (Patient not taking: Reported on 03/30/2021), Disp: 20 tablet, Rfl: 0   Physical exam:  Vitals:   03/30/21 1100  BP: (!) 132/97  Pulse: 100  Resp: 20  Temp: 97.8 F (36.6 C)  SpO2: 98%  Weight: 241 lb 1.6 oz (109.4 kg)   Physical Exam HENT:     Head: Normocephalic and atraumatic.  Eyes:     Pupils: Pupils are equal, round, and reactive to light.  Cardiovascular:     Rate and Rhythm: Normal rate and regular rhythm.     Heart sounds: Normal heart sounds.  Pulmonary:     Effort: Pulmonary effort is normal.     Breath sounds: Normal breath sounds.  Abdominal:     General: Bowel sounds are normal.     Palpations: Abdomen is soft.  Musculoskeletal:     Cervical back: Normal range of motion.     Comments: Dressing in place over bilateral lower extremities.  Skin:    General: Skin is warm and dry.  Neurological:     Mental Status: She is alert and oriented to person, place, and time.       CMP Latest Ref Rng & Units 02/10/2021  Glucose 70 - 99 mg/dL 89  BUN 6 - 20 mg/dL 13  Creatinine 0.44 - 1.00 mg/dL 0.85  Sodium 135 - 145 mmol/L 137  Potassium 3.5 - 5.1 mmol/L 3.5  Chloride 98 - 111 mmol/L 103  CO2 22 - 32 mmol/L 22  Calcium 8.9 - 10.3 mg/dL 9.5  Total Protein 6.5 - 8.1 g/dL 8.0  Total Bilirubin 0.3 - 1.2 mg/dL 1.2  Alkaline Phos 38 - 126 U/L 47  AST 15 - 41 U/L 20  ALT 0 - 44 U/L 16   CBC Latest Ref Rng & Units 02/10/2021  WBC 4.0 - 10.5 K/uL 9.9  Hemoglobin 12.0 - 15.0 g/dL  13.9  Hematocrit 36.0 - 46.0 % 44.4  Platelets 150 - 400 K/uL 259    Assessment and plan- Patient is a 45 y.o. 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  As per recommendations of rheumatology patient will receive IVIG at 1 g/kg on 2 consecutive days.  She will receive Tylenol Benadryl and 40 mg of Solu-Medrol as premeds.  Discussed risks and benefits of IVIG including all but not limited to possible risk of infusion reaction and delayed hemolytic anemia.  Patient understands and agrees to proceed as planned.  Labs from May 2022 have been reviewed.  She will subsequently get follow-up labs with rheumatology.  They will let us know if future doses of IVIG are required   Thank you for this kind referral and the opportunity to participate in the care of this  Patient   Visit Diagnosis 1. Polyarteritis nodosa ([place])     Dr. Randa Evens, MD, MPH [facility] at [facility] [phone] 03/30/2021

## 2021-04-30 DIAGNOSIS — A4902 Methicillin resistant Staphylococcus aureus infection, unspecified site: Secondary | ICD-10-CM | POA: Insufficient documentation

## 2021-04-30 DIAGNOSIS — M300 Polyarteritis nodosa: Secondary | ICD-10-CM | POA: Insufficient documentation

## 2021-05-14 ENCOUNTER — Encounter: Payer: Self-pay | Admitting: Oncology

## 2021-10-01 ENCOUNTER — Encounter: Payer: Self-pay | Admitting: Oncology

## 2021-12-17 ENCOUNTER — Emergency Department: Payer: Medicaid Other

## 2021-12-17 ENCOUNTER — Emergency Department
Admission: EM | Admit: 2021-12-17 | Discharge: 2021-12-17 | Disposition: A | Discharge status: Home / Self Care | Payer: Medicaid Other | Attending: Emergency Medicine | Admitting: Emergency Medicine

## 2021-12-17 ENCOUNTER — Other Ambulatory Visit: Payer: Self-pay

## 2021-12-17 DIAGNOSIS — I10 Essential (primary) hypertension: Secondary | ICD-10-CM | POA: Insufficient documentation

## 2021-12-17 DIAGNOSIS — R0981 Nasal congestion: Secondary | ICD-10-CM | POA: Diagnosis present

## 2021-12-17 DIAGNOSIS — U071 COVID-19: Secondary | ICD-10-CM | POA: Diagnosis not present

## 2021-12-17 LAB — RESP PANEL BY RT-PCR (FLU A&B, COVID) ARPGX2
Influenza A by PCR: NEGATIVE
Influenza B by PCR: NEGATIVE
SARS Coronavirus 2 by RT PCR: POSITIVE — AB

## 2021-12-17 LAB — CBC WITH DIFFERENTIAL/PLATELET
Abs Immature Granulocytes: 0.05 10*3/uL (ref 0.00–0.07)
Basophils Absolute: 0 10*3/uL (ref 0.0–0.1)
Basophils Relative: 0 %
Eosinophils Absolute: 0 10*3/uL (ref 0.0–0.5)
Eosinophils Relative: 0 %
HCT: 44.6 % (ref 36.0–46.0)
Hemoglobin: 14.2 g/dL (ref 12.0–15.0)
Immature Granulocytes: 1 %
Lymphocytes Relative: 19 %
Lymphs Abs: 1.5 10*3/uL (ref 0.7–4.0)
MCH: 29.3 pg (ref 26.0–34.0)
MCHC: 31.8 g/dL (ref 30.0–36.0)
MCV: 92 fL (ref 80.0–100.0)
Monocytes Absolute: 0.6 10*3/uL (ref 0.1–1.0)
Monocytes Relative: 7 %
Neutro Abs: 5.8 10*3/uL (ref 1.7–7.7)
Neutrophils Relative %: 73 %
Platelets: 282 10*3/uL (ref 150–400)
RBC: 4.85 MIL/uL (ref 3.87–5.11)
RDW: 17.1 % — ABNORMAL HIGH (ref 11.5–15.5)
WBC: 7.9 10*3/uL (ref 4.0–10.5)
nRBC: 0 % (ref 0.0–0.2)

## 2021-12-17 LAB — COMPREHENSIVE METABOLIC PANEL
ALT: 17 U/L (ref 0–44)
AST: 24 U/L (ref 15–41)
Albumin: 4.2 g/dL (ref 3.5–5.0)
Alkaline Phosphatase: 64 U/L (ref 38–126)
Anion gap: 11 (ref 5–15)
BUN: 13 mg/dL (ref 6–20)
CO2: 23 mmol/L (ref 22–32)
Calcium: 9.5 mg/dL (ref 8.9–10.3)
Chloride: 104 mmol/L (ref 98–111)
Creatinine, Ser: 1 mg/dL (ref 0.44–1.00)
GFR, Estimated: >60 mL/min (ref >60)
Glucose, Bld: 125 mg/dL — ABNORMAL HIGH (ref 70–99)
Potassium: 3.5 mmol/L (ref 3.5–5.1)
Sodium: 138 mmol/L (ref 135–145)
Total Bilirubin: 0.8 mg/dL (ref 0.3–1.2)
Total Protein: 9 g/dL — ABNORMAL HIGH (ref 6.5–8.1)

## 2021-12-17 MED ORDER — ONDANSETRON HCL 4 MG/2ML IJ SOLN
4.0000 mg | Freq: Once | INTRAMUSCULAR | Status: AC
  Administered 2021-12-17: 4 mg via INTRAVENOUS
  Filled 2021-12-17: qty 2

## 2021-12-17 MED ORDER — GUAIFENESIN 200 MG PO TABS: 400.0000 mg | ORAL_TABLET | Freq: Four times a day (QID) | ORAL | 0 refills | Status: DC | PRN

## 2021-12-17 MED ORDER — ALBUTEROL SULFATE HFA 108 (90 BASE) MCG/ACT IN AERS: 2.0000 | INHALATION_SPRAY | Freq: Four times a day (QID) | RESPIRATORY_TRACT | 0 refills | Status: DC | PRN

## 2021-12-17 MED ORDER — ONDANSETRON 8 MG PO TBDP
8.0000 mg | ORAL_TABLET | Freq: Three times a day (TID) | ORAL | 0 refills | Status: DC | PRN
Start: 2021-12-17 — End: 2023-03-12

## 2021-12-17 MED ORDER — SODIUM CHLORIDE 0.9 % IV BOLUS
1000.0000 mL | Freq: Once | INTRAVENOUS | Status: AC
  Administered 2021-12-17: 1000 mL via INTRAVENOUS

## 2021-12-17 MED ORDER — KETOROLAC TROMETHAMINE 30 MG/ML IJ SOLN
15.0000 mg | Freq: Once | INTRAMUSCULAR | Status: AC
  Administered 2021-12-17: 15 mg via INTRAVENOUS
  Filled 2021-12-17: qty 1

## 2021-12-17 NOTE — ED Provider Notes (Signed)
? ?[facility] ?Provider Note ? ? ? Event Date/Time  ? First MD Initiated Contact with Patient 12/17/21 1430   ?  (approximate) ? ? ?History  ? ?URI, Emesis, and Diarrhea ? ? ?HPI ? ?Regina Carpenter is a 46 y.o. female with PMH of collagen vascular disease and hypertension who presents with multiple symptoms over the last week, specifically nasal congestion, nonproductive cough, body aches and myalgias, and diarrhea.  She reports some associated shortness of breath and generalized malaise.  She had 1 episode of vomiting today.  She states that multiple family members were ill with similar symptoms but everyone else's have resolved.  She has taken Robitussin and Tylenol at home with mild relief. ? ?  ? ? ?Physical Exam  ? ?Triage Vital Signs: ?ED Triage Vitals  ?Enc Vitals Group  ?   BP 12/17/21 1333 122/80  ?   Pulse Rate 12/17/21 1333 (!) 102  ?   Resp 12/17/21 1333 17  ?   Temp 12/17/21 1333 98.1 ?F (36.7 ?C)  ?   Temp Source 12/17/21 1333 Oral  ?   SpO2 12/17/21 1333 97 %  ?   Weight 12/17/21 1334 264 lb (119.7 kg)  ?   Height 12/17/21 1334 '5\' 5"'$  (1.651 m)  ?   Head Circumference --   ?   Peak Flow --   ?   Pain Score --   ?   Pain Loc --   ?   Pain Edu? --   ?   Excl. in [place]? --   ? ? ?Most recent vital signs: ?Vitals:  ? 12/17/21 1613 12/17/21 1651  ?BP: 127/87   ?Pulse: 94 91  ?Resp: 18   ?Temp:    ?SpO2: 95% 97%  ? ? ? ?General: Alert, relatively well-appearing. ?CV:  Good peripheral perfusion.  ?Resp:  Normal effort.  Lungs CTAB. ?Abd:  No distention.  ?Other:  Mucous membranes somewhat dry.  Oropharynx clear. ? ? ?ED Results / Procedures / Treatments  ? ?Labs ?(all labs ordered are listed, but only abnormal results are displayed) ?Labs Reviewed  ?RESP PANEL BY RT-PCR (FLU A&B, COVID) ARPGX2 - Abnormal; Notable for the following components:  ?    Result Value  ? SARS Coronavirus 2 by RT PCR POSITIVE (*)   ? All other components within normal limits  ?CBC WITH DIFFERENTIAL/PLATELET -  Abnormal; Notable for the following components:  ? RDW 17.1 (*)   ? All other components within normal limits  ?COMPREHENSIVE METABOLIC PANEL - Abnormal; Notable for the following components:  ? Glucose, Bld 125 (*)   ? Total Protein 9.0 (*)   ? All other components within normal limits  ? ? ? ?EKG ? ? ? ? ?RADIOLOGY ? ?Chest x-ray: I independently viewed and interpreted the images; there are mild interstitial markings with no focal consolidation or edema ? ?PROCEDURES: ? ?Critical Care performed: No ? ?Procedures ? ? ?MEDICATIONS ORDERED IN ED: ?Medications  ?ondansetron (ZOFRAN) injection 4 mg (4 mg Intravenous Given 12/17/21 1607)  ?sodium chloride 0.9 % bolus 1,000 mL (1,000 mLs Intravenous New Bag/Given 12/17/21 1607)  ?ketorolac (TORADOL) 30 MG/ML injection 15 mg (15 mg Intravenous Given 12/17/21 1647)  ? ? ? ?IMPRESSION / MDM / ASSESSMENT AND PLAN / ED COURSE  ?I reviewed the triage vital signs and the nursing notes. ? ?46 year old female with PMH as noted above presents with multiple symptoms over the last week, specifically nasal congestion, nausea, vomiting, diarrhea, body aches, and  malaise. ? ?On exam the patient is overall relatively well-appearing.  Her vital signs are normal.  Physical exam is unremarkable except for dry mucous membranes. ? ?Initial lab work-up is reassuring.  CMP and CBC are both normal. ? ?Differential diagnosis includes, but is not limited to, COVID-19, influenza, other viral syndrome, acute bronchitis, pneumonia, gastroenteritis.  We will obtain a respiratory panel, chest x-ray, give fluids and Zofran, and reassess. ? ?----------------------------------------- ?6:13 PM on 12/17/2021 ?----------------------------------------- ? ?Chest x-ray shows findings compatible with viral bronchitis.  The patient's respiratory panel is positive for COVID which is consistent with her symptoms.  Since it has been a week since her symptoms started, she is not a candidate for Paxlovid. ? ?The patient  is feeling better after Zofran and fluids as well as some Toradol for the myalgias.  She is stable for discharge home at this time.  I counseled her on the results of the work-up and plan of care.  Return precautions given, and she expresses understanding. ? ? ?FINAL CLINICAL IMPRESSION(S) / ED DIAGNOSES  ? ?Final diagnoses:  ?COVID-19  ? ? ? ?Rx / DC Orders  ? ?ED Discharge Orders   ? ?      Ordered  ?  guaiFENesin 200 MG tablet  Every 6 hours PRN       ? 12/17/21 1812  ?  albuterol (VENTOLIN HFA) 108 (90 Base) MCG/ACT inhaler  Every 6 hours PRN       ? 12/17/21 1812  ?  ondansetron (ZOFRAN-ODT) 8 MG disintegrating tablet  Every 8 hours PRN       ? 12/17/21 1812  ? ?  ?  ? ?  ? ? ? ?Note:  This document was prepared using Dragon voice recognition software and may include unintentional dictation errors.  ?  Arta Silence, MD ?12/17/21 1814 ? ?

## 2021-12-17 NOTE — ED Triage Notes (Signed)
Pt c/o congestion with N/V/D for the past week, pt is ambulatory with a steady gait, in NAD at present ?

## 2021-12-17 NOTE — ED Notes (Signed)
Pt given drink with verbal okay from [place].  ?

## 2021-12-17 NOTE — Discharge Instructions (Signed)
Return to the ER for new, worsening, or persistent severe nausea or vomiting, difficulty breathing, weakness, or any other new or worsening symptoms that concern you. ?

## 2022-02-17 ENCOUNTER — Emergency Department: Payer: Medicaid Other

## 2022-02-17 ENCOUNTER — Other Ambulatory Visit: Payer: Self-pay

## 2022-02-17 ENCOUNTER — Encounter: Payer: Self-pay | Admitting: Emergency Medicine

## 2022-02-17 ENCOUNTER — Emergency Department
Admission: EM | Admit: 2022-02-17 | Discharge: 2022-02-17 | Disposition: A | Discharge status: Home / Self Care | Payer: Medicaid Other | Attending: Emergency Medicine | Admitting: Emergency Medicine

## 2022-02-17 DIAGNOSIS — R0602 Shortness of breath: Secondary | ICD-10-CM | POA: Diagnosis not present

## 2022-02-17 DIAGNOSIS — D72829 Elevated white blood cell count, unspecified: Secondary | ICD-10-CM | POA: Insufficient documentation

## 2022-02-17 DIAGNOSIS — R0789 Other chest pain: Secondary | ICD-10-CM | POA: Insufficient documentation

## 2022-02-17 DIAGNOSIS — R079 Chest pain, unspecified: Secondary | ICD-10-CM

## 2022-02-17 DIAGNOSIS — Z7901 Long term (current) use of anticoagulants: Secondary | ICD-10-CM | POA: Insufficient documentation

## 2022-02-17 DIAGNOSIS — Z720 Tobacco use: Secondary | ICD-10-CM | POA: Diagnosis not present

## 2022-02-17 LAB — CBC
HCT: 40.5 % (ref 36.0–46.0)
Hemoglobin: 12.8 g/dL (ref 12.0–15.0)
MCH: 29.9 pg (ref 26.0–34.0)
MCHC: 31.6 g/dL (ref 30.0–36.0)
MCV: 94.6 fL (ref 80.0–100.0)
Platelets: 223 10*3/uL (ref 150–400)
RBC: 4.28 MIL/uL (ref 3.87–5.11)
RDW: 15.7 % — ABNORMAL HIGH (ref 11.5–15.5)
WBC: 11 10*3/uL — ABNORMAL HIGH (ref 4.0–10.5)
nRBC: 0 % (ref 0.0–0.2)

## 2022-02-17 LAB — BASIC METABOLIC PANEL
Anion gap: 9 (ref 5–15)
BUN: 14 mg/dL (ref 6–20)
CO2: 21 mmol/L — ABNORMAL LOW (ref 22–32)
Calcium: 9.1 mg/dL (ref 8.9–10.3)
Chloride: 108 mmol/L (ref 98–111)
Creatinine, Ser: 0.95 mg/dL (ref 0.44–1.00)
GFR, Estimated: >60 mL/min (ref >60)
Glucose, Bld: 155 mg/dL — ABNORMAL HIGH (ref 70–99)
Potassium: 3.6 mmol/L (ref 3.5–5.1)
Sodium: 138 mmol/L (ref 135–145)

## 2022-02-17 LAB — POC URINE PREG, ED: Preg Test, Ur: NEGATIVE

## 2022-02-17 LAB — TROPONIN I (HIGH SENSITIVITY)
Troponin I (High Sensitivity): 4 ng/L (ref <18)
Troponin I (High Sensitivity): 3 ng/L (ref <18)

## 2022-02-17 MED ORDER — CYCLOBENZAPRINE HCL 10 MG PO TABS: 10.0000 mg | ORAL_TABLET | Freq: Three times a day (TID) | ORAL | 0 refills | Status: AC | PRN

## 2022-02-17 MED ORDER — IOHEXOL 350 MG/ML SOLN
75.0000 mL | Freq: Once | INTRAVENOUS | Status: AC | PRN
  Administered 2022-02-17: 75 mL via INTRAVENOUS

## 2022-02-17 MED ORDER — LIDOCAINE 5 % EX PTCH: 1.0000 | MEDICATED_PATCH | Freq: Two times a day (BID) | CUTANEOUS | 0 refills | Status: AC

## 2022-02-17 NOTE — ED Provider Notes (Signed)
[facility] Provider Note    Event Date/Time   First MD Initiated Contact with Patient 02/17/22 1355     (approximate)   History   Chief Complaint Chest Pain   HPI Regina Carpenter is a 46 y.o. female, history of tobacco use, arthritis, collagen vascular disease, systemic vasculitis, rheumatoid arthritis, presents to the emergency department for evaluation of chest pain.  Patient states that she has been experiencing right-sided chest pain for the past 2 weeks.  Gradual onset, sharp, tender to palpation.  Additionally endorses intermittent shortness of breath as well.  She states that she has a history of blood clots in her extremities and is currently on Eliquis.  Denies fever/chills, abdominal pain, flank pain, nausea/vomiting, diarrhea, dysuria, headache, numbness/tingling upper or lower extremities, rash/lesions, or dizziness/lightheadedness.  History Limitations: No limitations        Physical Exam  Triage Vital Signs: ED Triage Vitals [02/17/22 1146]  Enc Vitals Group     BP (!) 133/94     Pulse Rate (!) 104     Resp 16     Temp 98.5 F (36.9 C)     Temp Source Oral     SpO2 96 %     Weight 264 lb (119.7 kg)     Height '5\' 5"'$  (1.651 m)     Head Circumference      Peak Flow      Pain Score 8     Pain Loc      Pain Edu?      Excl. in [place]?     Most recent vital signs: Vitals:   02/17/22 1146 02/17/22 1430  BP: (!) 133/94 133/86  Pulse: (!) 104 99  Resp: 16 17  Temp: 98.5 F (36.9 C)   SpO2: 96% 99%    General: Awake, NAD.  Skin: Warm, dry. No rashes or lesions.  Eyes: PERRL. Conjunctivae normal.  CV: Good peripheral perfusion.  Resp: Normal effort.  Abd: Soft, non-tender. No distention.  Neuro: At baseline. No gross neurological deficits.   Focused Exam: Mild tenderness with palpation of the ribs on the right side, particularly lateral aspects.  No overlying erythema, lesions, or rashes.  Physical Exam    ED Results /  Procedures / Treatments  Labs (all labs ordered are listed, but only abnormal results are displayed) Labs Reviewed  BASIC METABOLIC PANEL - Abnormal; Notable for the following components:      Result Value   CO2 21 (*)    Glucose, Bld 155 (*)    All other components within normal limits  CBC - Abnormal; Notable for the following components:   WBC 11.0 (*)    RDW 15.7 (*)    All other components within normal limits  POC URINE PREG, ED  TROPONIN I (HIGH SENSITIVITY)  TROPONIN I (HIGH SENSITIVITY)     EKG Sinus tachycardia, rate of 105, no ST segment changes, no QRS prolongation, no QT prolongation, no axis deviation, no AV blocks   RADIOLOGY  ED Provider Interpretation: I personally reviewed and interpreted these images.  Chest x-ray shows no acute pathology.  CT PE study shows no evidence of pulmonary embolus.  DG Chest 2 View  Result Date: 02/17/2022 CLINICAL DATA:  Chest pain. EXAM: CHEST - 2 VIEW COMPARISON:  Chest x-ray December 17, 2021. FINDINGS: Low lung volumes. No consolidation. No visible pleural effusions or pneumothorax. Cardiomediastinal silhouette is within normal limits. No acute osseous abnormality. IMPRESSION: No active cardiopulmonary disease. Electronically Signed  By: Margaretha Sheffield M.D.   On: 02/17/2022 12:14   CT Angio Chest PE W and/or Wo Contrast  Result Date: 02/17/2022 CLINICAL DATA:  Pulmonary embolism suspected, a no D-dimer. Cough. Right upper chest pain. Pain is worse with movement or coughing. EXAM: CT ANGIOGRAPHY CHEST WITH CONTRAST TECHNIQUE: Multidetector CT imaging of the chest was performed using the standard protocol during bolus administration of intravenous contrast. Multiplanar CT image reconstructions and MIPs were obtained to evaluate the vascular anatomy. RADIATION DOSE REDUCTION: This exam was performed according to the departmental dose-optimization program which includes automated exposure control, adjustment of the mA and/or kV  according to patient size and/or use of iterative reconstruction technique. CONTRAST:  8m OMNIPAQUE IOHEXOL 350 MG/ML SOLN COMPARISON:  CTA chest 06/23/2019. Two-view chest x-ray 02/17/2022 and 12/17/2021. FINDINGS: Cardiovascular: The heart size is normal. No significant pericardial effusion is present. The aorta and great vessel origins are within normal limits. Pulmonary artery opacification is excellent. No focal filling defects are present to suggest pulmonary emboli. Pulmonary artery size is normal. Mediastinum/Nodes: No enlarged mediastinal, hilar, or axillary lymph nodes. Thyroid gland, trachea, and esophagus demonstrate no significant findings. Lungs/Pleura: Mild dependent atelectasis is present bilaterally. No nodule or mass lesion is present. No focal airspace disease is present. Upper Abdomen: Limited imaging the abdomen is unremarkable. There is no significant adenopathy. No solid organ lesions are present. Musculoskeletal: No chest wall abnormality. No acute or significant osseous findings. Review of the MIP images confirms the above findings. IMPRESSION: 1. No pulmonary embolus. 2. No acute or focal lesion to explain the patient's symptoms. Electronically Signed   By: CSan MorelleM.D.   On: 02/17/2022 14:52    PROCEDURES:  Critical Care performed: N/A.  Procedures    MEDICATIONS ORDERED IN ED: Medications  iohexol (OMNIPAQUE) 350 MG/ML injection 75 mL (75 mLs Intravenous Contrast Given 02/17/22 1439)     IMPRESSION / MDM / ASSESSMENT AND PLAN / ED COURSE  I reviewed the triage vital signs and the nursing notes.                              Differential diagnosis includes, but is not limited to, ACS, myocarditis/pericarditis, costochondritis, intercostal muscle strain, pulmonary embolism.  ED Course Patient appears well, vitals within normal limits.  NAD.  CBC shows mildly elevated leukocytosis at 11.0, otherwise normal.  BMP shows no evidence of electrolyte  abnormalities or AKI.  Initial troponin 4.  Second troponin 3.  Unlikely ACS or myocarditis.    Assessment/Plan Patient presents with right-sided chest pain x2 weeks.  Lab work-up has been reassuring.  Unremarkable EKG.  CT scan shows no evidence of pulmonary embolus.  Low suspicion for any serious or life-threatening pathology at this time.  Her physical exam is suggestive of possible costochondritis versus intercostal muscle strain.  We will go ahead treat with cyclobenzaprine and lidocaine patches.  Recommend that she follow-up with her primary care provider as needed.  She is agreeable with this plan.  We will plan to discharge.  Considered admission for this patient, but given her stable presentation, unremarkable work-up, she is unlikely to benefit from admission.  Provided the patient with anticipatory guidance, return precautions, and educational material. Encouraged the patient to return to the emergency department at any time if they begin to experience any new or worsening symptoms. Patient expressed understanding and agreed with the plan.       FINAL CLINICAL IMPRESSION(S) /  ED DIAGNOSES   Final diagnoses:  Chest pain, unspecified type     Rx / DC Orders   ED Discharge Orders          Ordered    cyclobenzaprine (FLEXERIL) 10 MG tablet  3 times daily PRN        02/17/22 1540    lidocaine (LIDODERM) 5 %  Every 12 hours        02/17/22 1540             Note:  This document was prepared using Dragon voice recognition software and may include unintentional dictation errors.   Teodoro Spray, [place] 02/17/22 1618    Vanessa , MD 02/18/22 [phone]

## 2022-02-17 NOTE — Discharge Instructions (Addendum)
-  Continue to treat pain with Tylenol as needed.  You may additionally utilize lidocaine patches as well. ?-You may take cyclobenzaprine as well for muscle relaxation.  However, this may make you drowsy. ?-Follow-up with your primary care provider as discussed. ?-Return to the emergency department anytime if you begin to experience any new or worsening symptoms. ?

## 2022-02-17 NOTE — ED Triage Notes (Signed)
Pt states that she started with a cough then started having right sided upper chest pain and then states the pain started radiating down into the ribs on the right side, pt states  movement and coughing makes the pain worse, denies any known fever ?

## 2022-02-17 NOTE — ED Notes (Signed)
See triage note  presents with a 3 day hx of right sided chest pain with slight cough  denies any fever area is tender on palpation under right breast and lateral rib area ?

## 2022-08-14 ENCOUNTER — Emergency Department
Admission: EM | Admit: 2022-08-14 | Discharge: 2022-08-14 | Disposition: A | Discharge status: Home / Self Care | Payer: Medicaid Other | Attending: Emergency Medicine | Admitting: Emergency Medicine

## 2022-08-14 ENCOUNTER — Encounter: Payer: Self-pay | Admitting: Emergency Medicine

## 2022-08-14 ENCOUNTER — Emergency Department: Payer: Medicaid Other

## 2022-08-14 ENCOUNTER — Other Ambulatory Visit: Payer: Self-pay

## 2022-08-14 DIAGNOSIS — I10 Essential (primary) hypertension: Secondary | ICD-10-CM | POA: Insufficient documentation

## 2022-08-14 DIAGNOSIS — M25562 Pain in left knee: Secondary | ICD-10-CM

## 2022-08-14 DIAGNOSIS — F17200 Nicotine dependence, unspecified, uncomplicated: Secondary | ICD-10-CM | POA: Insufficient documentation

## 2022-08-14 MED ORDER — LIDOCAINE 5 % EX PTCH
1.0000 | MEDICATED_PATCH | CUTANEOUS | Status: DC
  Administered 2022-08-14: 1 via TRANSDERMAL
  Filled 2022-08-14: qty 1

## 2022-08-14 NOTE — ED Provider Notes (Signed)
[facility] Provider Note    Event Date/Time   First MD Initiated Contact with Patient 08/14/22 1110     (approximate)   History   Knee Pain   HPI  Regina Carpenter is a 46 y.o. female who presents today for evaluation of left knee pain.  Patient reports that this has been ongoing for the past 3 weeks.  She reports that occasionally she feels a clicking and a popping.  She denies any injury.  No fevers or chills.  She is still able to ambulate.  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   Abdominal pain 11/14/2016   Lymphadenopathy, mesenteric    Arthralgia of right knee 04/02/2015   Cellulitis of left leg 03/21/2015   Right hand pain 03/21/2015   Tobacco use 03/21/2015          Physical Exam   Triage Vital Signs: ED Triage Vitals  Enc Vitals Group     BP 08/14/22 1009 (!) 138/93     Pulse Rate 08/14/22 1009 79     Resp 08/14/22 1009 20     Temp 08/14/22 1009 97.9 F (36.6 C)     Temp Source 08/14/22 1009 Oral     SpO2 08/14/22 1009 100 %     Weight 08/14/22 1007 264 lb 8.8 oz (120 kg)     Height 08/14/22 1007 '5\' 5"'$  (1.651 m)     Head Circumference --      Peak Flow --      Pain Score 08/14/22 1007 8     Pain Loc --      Pain Edu? --      Excl. in [place]? --     Most recent vital signs: Vitals:   08/14/22 1009  BP: (!) 138/93  Pulse: 79  Resp: 20  Temp: 97.9 F (36.6 C)  SpO2: 100%    Physical  Exam Vitals and nursing note reviewed.  Constitutional:      General: Awake and alert. No acute distress.    Appearance: Normal appearance. The patient is obese.  HENT:     Head: Normocephalic and atraumatic.     Mouth: Mucous membranes are moist.  Eyes:     General: PERRL. Normal EOMs        Right eye: No discharge.        Left eye: No discharge.     Conjunctiva/sclera: Conjunctivae normal.  Cardiovascular:     Rate and Rhythm: Normal rate and regular rhythm.     Pulses: Normal pulses.  Pulmonary:     Effort: Pulmonary effort is normal. No respiratory distress.     Breath sounds: Normal breath sounds.  Abdominal:     Abdomen is soft. There is no abdominal tenderness. No rebound or guarding. No distention. Musculoskeletal:        General: No swelling. Normal range of motion.     Cervical back: Normal range of motion and neck supple.  Left knee: No deformity or  rash. No joint line tenderness. No patellar tenderness, no ballotment Warm and well perfused extremity with 2+ pedal pulses 5/5 strength to dorsiflexion and plantarflexion at the ankle with intact sensation throughout extremity Normal range of motion of the knee, with intact flexion and extension to active and passive range of motion. Extensor mechanism intact. No ligamentous laxity. Negative anterior/posterior drawer/negative lachman, negative mcmurrays No effusion or warmth Intact quadriceps, hamstring function, patellar tendon function Pelvis stable Full ROM of ankle without pain or swelling, no pitting edema Foot warm and well perfused Skin:    General: Skin is warm and dry.     Capillary Refill: Capillary refill takes less than 2 seconds.     Findings: No rash.  Neurological:     Mental Status: The patient is awake and alert.      ED Results / Procedures / Treatments   Labs (all labs ordered are listed, but only abnormal results are displayed) Labs Reviewed - No data to display   EKG     RADIOLOGY I  independently reviewed and interpreted imaging and agree with radiologists findings.     PROCEDURES:  Critical Care performed:   Procedures   MEDICATIONS ORDERED IN ED: Medications  lidocaine (LIDODERM) 5 % 1 patch (1 patch Transdermal Patch Applied 08/14/22 1126)     IMPRESSION / MDM / ASSESSMENT AND PLAN / ED COURSE  I reviewed the triage vital signs and the nursing notes.   Differential diagnosis includes, but is not limited to, effusion, sprain, contusion, dislocation, fracture, joint infection, tendon rupture. No evidence of neurological deficit or vascular compromise on exam. No fracture/dislocation on X-Ray. No deformity or obvious ligamentous laxity on exam.No constitutional symptoms or effusion to suggest septic joint. No history of immunosuppression. Overall well appearing, vital signs stable. No indication for diagnostic or therapeutic procedure such as arthrocentesis.  X-ray does reveal medial compartment narrowing which is the location of her pain.  ACE wrap given for extra support, advised to wear it during the day, remove it at night. Currently no unilateral leg swelling or pitting edema to suggest DVT. Symptoms most consistent with osteoarthritis at this time.  She was given an Ace wrap for extra support.  Return precautions and care instructions discussed. Outpatient follow-up advised. Patient agrees with plan of care.   Patient's presentation is most consistent with acute complicated illness / injury requiring diagnostic workup.      FINAL CLINICAL IMPRESSION(S) / ED DIAGNOSES   Final diagnoses:  Acute pain of left knee     Rx / DC Orders   ED Discharge Orders          Ordered    Apply wrap        08/14/22 1138             Note:  This document was prepared using Dragon voice recognition software and may include unintentional dictation errors.   Emeline Gins 08/14/22 1156    Duffy Bruce, MD 08/14/22 2156

## 2022-08-14 NOTE — Discharge Instructions (Addendum)
Rest, ice, elevate your knee.  Please follow-up with orthopedics for further management.  Please return for any new, worsening, or change in symptoms or other concerns.

## 2022-08-14 NOTE — ED Triage Notes (Signed)
Pt reports her left knee started hurting 3 weeks ago and will not stop. Pt reports pain is sharp in nature and gets worse when she walks moves certain ways.

## 2022-09-10 ENCOUNTER — Emergency Department
Admission: EM | Admit: 2022-09-10 | Discharge: 2022-09-10 | Disposition: A | Discharge status: Home / Self Care | Payer: Medicaid Other | Attending: Emergency Medicine | Admitting: Emergency Medicine

## 2022-09-10 ENCOUNTER — Emergency Department: Payer: Medicaid Other

## 2022-09-10 ENCOUNTER — Other Ambulatory Visit: Payer: Self-pay

## 2022-09-10 ENCOUNTER — Encounter: Payer: Self-pay | Admitting: Emergency Medicine

## 2022-09-10 DIAGNOSIS — I10 Essential (primary) hypertension: Secondary | ICD-10-CM | POA: Diagnosis not present

## 2022-09-10 DIAGNOSIS — M542 Cervicalgia: Secondary | ICD-10-CM | POA: Insufficient documentation

## 2022-09-10 LAB — POC URINE PREG, ED: Preg Test, Ur: NEGATIVE

## 2022-09-10 MED ORDER — ACETAMINOPHEN 500 MG PO TABS: 1000.0000 mg | ORAL_TABLET | Freq: Four times a day (QID) | ORAL | 2 refills | Status: AC | PRN

## 2022-09-10 MED ORDER — CYCLOBENZAPRINE HCL 10 MG PO TABS: 10.0000 mg | ORAL_TABLET | Freq: Three times a day (TID) | ORAL | 0 refills | Status: AC | PRN

## 2022-09-10 NOTE — Discharge Instructions (Addendum)
-  Your CT scan does not show any evidence of fractures fortunately.  I suspect that you likely endured a sprain of your neck.  Please review the education material regarding cervical sprain/strains, as well as the rehabilitation exercises.  -You may take the meloxicam as needed for pain/inflammation.  You may additionally take the cyclobenzaprine for muscle relaxation, though use caution as may make you dizzy/drowsy.  -If your symptoms fail to improve despite treatment after a few weeks, you may follow-up with the orthopedic surgeon listed in these instructions he may follow-up with the orthopedic surgeon listed in these instructions. ([facility])  -Return to the emergency department anytime if you begin to experience any new or worsening symptoms.

## 2022-09-10 NOTE — ED Provider Notes (Signed)
[facility] Provider Note    Event Date/Time   First MD Initiated Contact with Patient 09/10/22 0830     (approximate)   History   Chief Complaint Neck Pain   HPI Regina Carpenter is a 46 y.o. female, history of inflammatory polyarthritis, hypertension, collagen vascular disease, presents to the emergency department for neck pain.  She states that on Thanksgiving day, she fell down and hit her neck on the back of a chair.  She states that since then, she has had persistent neck pain with radiation of pain along the trapezius muscle/shoulders.  She endorses some intermittent numbness/tingling in her fingers as well.  Denies any head injury.  Denies fever/chills, chest pain, shortness of breath, rash/lesions, nausea/vomiting, weakness, vision change, hearing changes, or dizziness/lightheadedness.  History Limitations: No limitations.  Midline cervical spine tenderness.        Physical Exam  Triage Vital Signs: ED Triage Vitals  Enc Vitals Group     BP 09/10/22 0758 125/87     Pulse Rate 09/10/22 0757 98     Resp 09/10/22 0757 18     Temp 09/10/22 0757 98.3 F (36.8 C)     Temp Source 09/10/22 0757 Oral     SpO2 09/10/22 0757 98 %     Weight 09/10/22 0757 264 lb (119.7 kg)     Height 09/10/22 0757 '5\' 5"'$  (1.651 m)     Head Circumference --      Peak Flow --      Pain Score 09/10/22 0757 10     Pain Loc --      Pain Edu? --      Excl. in [place]? --     Most recent vital signs: Vitals:   09/10/22 0757 09/10/22 0758  BP:  125/87  Pulse: 98   Resp: 18   Temp: 98.3 F (36.8 C)   SpO2: 98%     General: Awake, NAD.  Skin: Warm, dry. No rashes or lesions.  Eyes: PERRL. Conjunctivae normal.  CV: Good peripheral perfusion.  Resp: Normal effort.  Abd: Soft, non-tender. No distention.  Neuro: At baseline. No gross neurological deficits.  Musculoskeletal: Normal ROM of all extremities.  Focused Exam: Midline cervical spinal tenderness noted along C6  or C7.  Normal range of motion of the head/neck.  Normal range of motion of all extremities.  5/5 strength and sensation in the upper extremities.      ED Results / Procedures / Treatments  Labs (all labs ordered are listed, but only abnormal results are displayed) Labs Reviewed  POC URINE PREG, ED     EKG N/A.    RADIOLOGY  ED Provider Interpretation: I personally reviewed and interpreted the CT scan, no evidence of acute fractures.  CT Cervical Spine Wo Contrast  Result Date: 09/10/2022 CLINICAL DATA:  Provided history: Neck trauma, midline tenderness. Additional history provided: Fall on Thanksgiving, pain in back of neck and upper back, neck spasms. EXAM: CT CERVICAL SPINE WITHOUT CONTRAST TECHNIQUE: Multidetector CT imaging of the cervical spine was performed without intravenous contrast. Multiplanar CT image reconstructions were also generated. RADIATION DOSE REDUCTION: This exam was performed according to the departmental dose-optimization program which includes automated exposure control, adjustment of the mA and/or kV according to patient size and/or use of iterative reconstruction technique. COMPARISON:  Cervical spine radiographs 08/06/2019. FINDINGS: Alignment: Nonspecific reversal of the expected cervical lordosis. No significant spondylolisthesis. Skull base and vertebrae: The basion-dental and atlanto-dental intervals are maintained.No evidence of acute  fracture to the cervical spine. Soft tissues and spinal canal: No prevertebral fluid or swelling. No visible canal hematoma. Disc levels: No significant bony spinal canal or neural foraminal narrowing at any level. Upper chest: No consolidation within the imaged lung apices. No visible pneumothorax. IMPRESSION: No evidence of acute fracture to the cervical spine. Nonspecific reversal of the expected cervical lordosis. Electronically Signed   By: Kellie Simmering D.O.   On: 09/10/2022 09:36    PROCEDURES:  Critical Care  performed: N/A.  Procedures    MEDICATIONS ORDERED IN ED: Medications - No data to display   IMPRESSION / MDM / [place] / ED COURSE  I reviewed the triage vital signs and the nursing notes.                              Differential diagnosis includes, but is not limited to, cervical spine fracture, disc herniation, spondylolisthesis, cervical strain, trapezius strain.  Assessment/Plan Patient presents with neck pain following mechanical fall when examined today.  She appears well clinically, though does have some midline cervical spine tenderness on exam.  Her CT does not show any acute abnormalities.  She did endorse some numbness/tingling intermittently in her arms, though still neurovascular intact and maintains full grip strength and range of motion.  I suspect likely cervical strain with some radiation into the trapezius muscles.  Will provide her with a prescription for meloxicam and cyclobenzaprine to help manage her symptoms.  Recommend that she follow with her primary care provider as needed.  Will discharge.  Provided the patient with anticipatory guidance, return precautions, and educational material. Encouraged the patient to return to the emergency department at any time if they begin to experience any new or worsening symptoms. Patient expressed understanding and agreed with the plan.   Patient's presentation is most consistent with acute complicated illness / injury requiring diagnostic workup.       FINAL CLINICAL IMPRESSION(S) / ED DIAGNOSES   Final diagnoses:  Neck pain     Rx / DC Orders   ED Discharge Orders          Ordered    cyclobenzaprine (FLEXERIL) 10 MG tablet  3 times daily PRN        09/10/22 1022    acetaminophen (TYLENOL) 500 MG tablet  Every 6 hours PRN        09/10/22 1022             Note:  This document was prepared using Dragon voice recognition software and may include unintentional dictation errors.   Teodoro Spray, [place] 09/10/22 1023    Naaman Plummer, MD 09/10/22 1055

## 2022-09-10 NOTE — ED Triage Notes (Signed)
Pt reports she fell on thanksgiving day and since then has been having pain to back of neck and now having muscle spasms to upper back.

## 2022-09-30 ENCOUNTER — Other Ambulatory Visit: Payer: Self-pay | Admitting: Internal Medicine

## 2022-09-30 DIAGNOSIS — Z1231 Encounter for screening mammogram for malignant neoplasm of breast: Secondary | ICD-10-CM

## 2023-03-11 ENCOUNTER — Other Ambulatory Visit: Payer: Self-pay

## 2023-03-11 ENCOUNTER — Encounter: Payer: Self-pay | Admitting: Emergency Medicine

## 2023-03-11 ENCOUNTER — Emergency Department
Admission: EM | Admit: 2023-03-11 | Discharge: 2023-03-11 | Disposition: A | Discharge status: Home / Self Care | Payer: Medicaid Other | Attending: Emergency Medicine | Admitting: Emergency Medicine

## 2023-03-11 ENCOUNTER — Emergency Department: Payer: Medicaid Other

## 2023-03-11 DIAGNOSIS — I10 Essential (primary) hypertension: Secondary | ICD-10-CM | POA: Insufficient documentation

## 2023-03-11 DIAGNOSIS — Z20822 Contact with and (suspected) exposure to covid-19: Secondary | ICD-10-CM | POA: Insufficient documentation

## 2023-03-11 DIAGNOSIS — Z7901 Long term (current) use of anticoagulants: Secondary | ICD-10-CM | POA: Insufficient documentation

## 2023-03-11 DIAGNOSIS — B349 Viral infection, unspecified: Secondary | ICD-10-CM | POA: Insufficient documentation

## 2023-03-11 DIAGNOSIS — R059 Cough, unspecified: Secondary | ICD-10-CM | POA: Diagnosis present

## 2023-03-11 LAB — CBC WITH DIFFERENTIAL/PLATELET
Abs Immature Granulocytes: 0.05 10*3/uL (ref 0.00–0.07)
Basophils Absolute: 0.1 10*3/uL (ref 0.0–0.1)
Basophils Relative: 0 %
Eosinophils Absolute: 0.3 10*3/uL (ref 0.0–0.5)
Eosinophils Relative: 2 %
HCT: 39.6 % (ref 36.0–46.0)
Hemoglobin: 12.6 g/dL (ref 12.0–15.0)
Immature Granulocytes: 0 %
Lymphocytes Relative: 21 %
Lymphs Abs: 3 10*3/uL (ref 0.7–4.0)
MCH: 30 pg (ref 26.0–34.0)
MCHC: 31.8 g/dL (ref 30.0–36.0)
MCV: 94.3 fL (ref 80.0–100.0)
Monocytes Absolute: 1.3 10*3/uL — ABNORMAL HIGH (ref 0.1–1.0)
Monocytes Relative: 10 %
Neutro Abs: 9.3 10*3/uL — ABNORMAL HIGH (ref 1.7–7.7)
Neutrophils Relative %: 67 %
Platelets: 264 10*3/uL (ref 150–400)
RBC: 4.2 MIL/uL (ref 3.87–5.11)
RDW: 14.4 % (ref 11.5–15.5)
WBC: 14 10*3/uL — ABNORMAL HIGH (ref 4.0–10.5)
nRBC: 0 % (ref 0.0–0.2)

## 2023-03-11 LAB — COMPREHENSIVE METABOLIC PANEL
ALT: 13 U/L (ref 0–44)
AST: 17 U/L (ref 15–41)
Albumin: 3.4 g/dL — ABNORMAL LOW (ref 3.5–5.0)
Alkaline Phosphatase: 65 U/L (ref 38–126)
Anion gap: 10 (ref 5–15)
BUN: 12 mg/dL (ref 6–20)
CO2: 25 mmol/L (ref 22–32)
Calcium: 9.5 mg/dL (ref 8.9–10.3)
Chloride: 105 mmol/L (ref 98–111)
Creatinine, Ser: 0.76 mg/dL (ref 0.44–1.00)
GFR, Estimated: >60 mL/min (ref >60)
Glucose, Bld: 86 mg/dL (ref 70–99)
Potassium: 3.5 mmol/L (ref 3.5–5.1)
Sodium: 140 mmol/L (ref 135–145)
Total Bilirubin: 0.9 mg/dL (ref 0.3–1.2)
Total Protein: 7.8 g/dL (ref 6.5–8.1)

## 2023-03-11 LAB — SARS CORONAVIRUS 2 BY RT PCR: SARS Coronavirus 2 by RT PCR: NEGATIVE

## 2023-03-11 LAB — CK: Total CK: 79 U/L (ref 38–234)

## 2023-03-11 MED ORDER — SODIUM CHLORIDE 0.9 % IV BOLUS
1000.0000 mL | Freq: Once | INTRAVENOUS | Status: AC
  Administered 2023-03-11: 1000 mL via INTRAVENOUS

## 2023-03-11 MED ORDER — KETOROLAC TROMETHAMINE 30 MG/ML IJ SOLN
15.0000 mg | Freq: Once | INTRAMUSCULAR | Status: AC
  Administered 2023-03-11: 15 mg via INTRAVENOUS
  Filled 2023-03-11: qty 1

## 2023-03-11 NOTE — ED Notes (Signed)
Pt sleeping with unlabored respirations. No urine sample yet.

## 2023-03-11 NOTE — ED Provider Notes (Signed)
[facility] Provider Note    Event Date/Time   First MD Initiated Contact with Patient 03/11/23 [phone]     (approximate)   History   Cough   HPI  Simrah Chatham is a 47 y.o. female with a history of pulmonary embolism on Eliquis, rheumatoid arthritis, hypertension, collagen vascular disease and inflammatory polyarthritis who presents with diffuse bodyaches for the last 2 days, stating "everything hurts" including her back, arms and legs, chest, abdomen, and head.  The patient reports cough productive of yellow sputum and nasal congestion but no rhinorrhea.  She has had some nausea and vomiting as well has subjective fever.  She denies any sick contacts or other recent illness.  I reviewed the past medical records.  The patient's most recent outpatient counter was on 3/22 with internal medicine for follow-up of her chronic conditions.   Physical Exam   Triage Vital Signs: ED Triage Vitals  Enc Vitals Group     BP 03/11/23 0627 135/87     Pulse Rate 03/11/23 0627 (!) 103     Resp 03/11/23 0627 15     Temp 03/11/23 0627 100 F (37.8 C)     Temp Source 03/11/23 0627 Oral     SpO2 03/11/23 0627 100 %     Weight 03/11/23 0628 242 lb (109.8 kg)     Height 03/11/23 0626 5\' 5"  (1.651 m)     Head Circumference --      Peak Flow --      Pain Score 03/11/23 0626 10     Pain Loc --      Pain Edu? --      Excl. in GC? --     Most recent vital signs: Vitals:   03/11/23 0930 03/11/23 1000  BP: 102/67 119/88  Pulse: 99 97  Resp:  18  Temp:    SpO2: 95% 94%     General: Alert, uncomfortable appearing but in no acute distress. CV:  Good peripheral perfusion.  Resp:  Normal effort.  Lungs CTAB. Abd:  No distention.  Other:  No peripheral edema.   ED Results / Procedures / Treatments   Labs (all labs ordered are listed, but only abnormal results are displayed) Labs Reviewed  COMPREHENSIVE METABOLIC PANEL - Abnormal; Notable for the following  components:      Result Value   Albumin 3.4 (*)    All other components within normal limits  CBC WITH DIFFERENTIAL/PLATELET - Abnormal; Notable for the following components:   WBC 14.0 (*)    Neutro Abs 9.3 (*)    Monocytes Absolute 1.3 (*)    All other components within normal limits  SARS CORONAVIRUS 2 BY RT PCR  CK     EKG    RADIOLOGY  Chest x-ray: I reviewed and interpreted the images; there is no focal consolidation or edema   PROCEDURES:  Critical Care performed: No  Procedures   MEDICATIONS ORDERED IN ED: Medications  sodium chloride 0.9 % bolus 1,000 mL (0 mLs Intravenous Stopped 03/11/23 1013)  ketorolac (TORADOL) 30 MG/ML injection 15 mg (15 mg Intravenous Given 03/11/23 0756)     IMPRESSION / MDM / ASSESSMENT AND PLAN / ED COURSE  I reviewed the triage vital signs and the nursing notes.  47 year old female with PMH as noted above presents with diffuse bodyaches, generalized weakness, subjective fever, as well as cough and congestion over the last few days.  On exam she has a borderline elevated temperature and heart rate  with otherwise normal vital signs.  Patient uncomfortable but overall relatively well-appearing with no significant exam findings.  Differential diagnosis includes, but is not limited to, COVID-19, other viral syndrome, pneumonia, UTI, less likely possible flare of her chronic rheumatologic conditions.  We will give fluids, Toradol, obtain lab workup, chest x-ray, and reassess.  Patient's presentation is most consistent with acute complicated illness / injury requiring diagnostic workup.  ----------------------------------------- 10:27 AM on 03/11/2023 -----------------------------------------  Lab workup shows mild leukocytosis which is nonspecific and consistent with a viral infection.  COVID is negative.  Electrolytes are unremarkable.  CK is normal.  Chest x-ray does not show any acute findings.  Overall presentation is consistent with  viral syndrome.  The patient is feeling significantly better after Toradol and fluids.  She feels comfortable going home.  She is stable for discharge at this time.  Return precautions given, and she expresses understanding.  FINAL CLINICAL IMPRESSION(S) / ED DIAGNOSES   Final diagnoses:  Viral syndrome     Rx / DC Orders   ED Discharge Orders     None        Note:  This document was prepared using Dragon voice recognition software and may include unintentional dictation errors.    Dionne Bucy, MD 03/11/23 1555

## 2023-03-11 NOTE — ED Triage Notes (Signed)
Patient ambulatory to triage with steady gait, without difficulty or distress noted; pt reports x 2 days having prod cough yellow sputum, congestion, fever & body aches

## 2023-03-11 NOTE — ED Notes (Signed)
Patient transported to X-ray 

## 2023-03-11 NOTE — Discharge Instructions (Signed)
Your chest x-ray does not show any signs of pneumonia and your COVID test is negative.  Your symptoms are likely due to a viral infection.  Make sure to drink plenty of fluids, take Tylenol as needed for fever and bodyaches (up to 650 mg every 4 hours).  Follow-up with your primary care physician.  Return to the ER for any new or worsening symptoms including worsening cough or shortness of breath, chest pain, fever, vomiting, weakness, or any other new or worsening symptoms that concern you.

## 2023-03-12 ENCOUNTER — Encounter: Payer: Self-pay | Admitting: Emergency Medicine

## 2023-03-12 ENCOUNTER — Other Ambulatory Visit: Payer: Self-pay

## 2023-03-12 ENCOUNTER — Emergency Department
Admission: EM | Admit: 2023-03-12 | Discharge: 2023-03-12 | Disposition: A | Discharge status: Home / Self Care | Payer: Medicaid Other | Attending: Emergency Medicine | Admitting: Emergency Medicine

## 2023-03-12 ENCOUNTER — Emergency Department: Payer: Medicaid Other

## 2023-03-12 DIAGNOSIS — Z7901 Long term (current) use of anticoagulants: Secondary | ICD-10-CM | POA: Diagnosis not present

## 2023-03-12 DIAGNOSIS — R109 Unspecified abdominal pain: Secondary | ICD-10-CM | POA: Diagnosis present

## 2023-03-12 DIAGNOSIS — I880 Nonspecific mesenteric lymphadenitis: Secondary | ICD-10-CM | POA: Insufficient documentation

## 2023-03-12 LAB — COMPREHENSIVE METABOLIC PANEL
ALT: 12 U/L (ref 0–44)
AST: 16 U/L (ref 15–41)
Albumin: 3.4 g/dL — ABNORMAL LOW (ref 3.5–5.0)
Alkaline Phosphatase: 62 U/L (ref 38–126)
Anion gap: 9 (ref 5–15)
BUN: 14 mg/dL (ref 6–20)
CO2: 22 mmol/L (ref 22–32)
Calcium: 9.3 mg/dL (ref 8.9–10.3)
Chloride: 104 mmol/L (ref 98–111)
Creatinine, Ser: 0.84 mg/dL (ref 0.44–1.00)
GFR, Estimated: >60 mL/min (ref >60)
Glucose, Bld: 105 mg/dL — ABNORMAL HIGH (ref 70–99)
Potassium: 3.7 mmol/L (ref 3.5–5.1)
Sodium: 135 mmol/L (ref 135–145)
Total Bilirubin: 0.4 mg/dL (ref 0.3–1.2)
Total Protein: 8 g/dL (ref 6.5–8.1)

## 2023-03-12 LAB — URINALYSIS, ROUTINE W REFLEX MICROSCOPIC
Bacteria, UA: NONE SEEN
Bilirubin Urine: NEGATIVE
Glucose, UA: NEGATIVE mg/dL
Hgb urine dipstick: NEGATIVE
Ketones, ur: NEGATIVE mg/dL
Nitrite: NEGATIVE
Protein, ur: NEGATIVE mg/dL
Specific Gravity, Urine: 1.045 — ABNORMAL HIGH (ref 1.005–1.030)
pH: 6 (ref 5.0–8.0)

## 2023-03-12 LAB — CBC WITH DIFFERENTIAL/PLATELET
Abs Immature Granulocytes: 0.09 10*3/uL — ABNORMAL HIGH (ref 0.00–0.07)
Basophils Absolute: 0.1 10*3/uL (ref 0.0–0.1)
Basophils Relative: 0 %
Eosinophils Absolute: 0.3 10*3/uL (ref 0.0–0.5)
Eosinophils Relative: 2 %
HCT: 39.5 % (ref 36.0–46.0)
Hemoglobin: 12.6 g/dL (ref 12.0–15.0)
Immature Granulocytes: 1 %
Lymphocytes Relative: 10 %
Lymphs Abs: 1.4 10*3/uL (ref 0.7–4.0)
MCH: 30 pg (ref 26.0–34.0)
MCHC: 31.9 g/dL (ref 30.0–36.0)
MCV: 94 fL (ref 80.0–100.0)
Monocytes Absolute: 0.9 10*3/uL (ref 0.1–1.0)
Monocytes Relative: 7 %
Neutro Abs: 11.6 10*3/uL — ABNORMAL HIGH (ref 1.7–7.7)
Neutrophils Relative %: 80 %
Platelets: 267 10*3/uL (ref 150–400)
RBC: 4.2 MIL/uL (ref 3.87–5.11)
RDW: 14.4 % (ref 11.5–15.5)
WBC: 14.3 10*3/uL — ABNORMAL HIGH (ref 4.0–10.5)
nRBC: 0 % (ref 0.0–0.2)

## 2023-03-12 LAB — HCG, QUANTITATIVE, PREGNANCY: hCG, Beta Chain, Quant, S: 1 m[IU]/mL (ref <5)

## 2023-03-12 LAB — LIPASE, BLOOD: Lipase: 25 U/L (ref 11–51)

## 2023-03-12 LAB — TROPONIN I (HIGH SENSITIVITY): Troponin I (High Sensitivity): 3 ng/L (ref <18)

## 2023-03-12 MED ORDER — KETOROLAC TROMETHAMINE 15 MG/ML IJ SOLN
15.0000 mg | Freq: Once | INTRAMUSCULAR | Status: AC
  Administered 2023-03-12: 15 mg via INTRAVENOUS
  Filled 2023-03-12: qty 1

## 2023-03-12 MED ORDER — ONDANSETRON HCL 4 MG/2ML IJ SOLN
4.0000 mg | Freq: Once | INTRAMUSCULAR | Status: AC
  Administered 2023-03-12: 4 mg via INTRAVENOUS
  Filled 2023-03-12: qty 2

## 2023-03-12 MED ORDER — ONDANSETRON 4 MG PO TBDP: 4.0000 mg | ORAL_TABLET | Freq: Three times a day (TID) | ORAL | 0 refills | Status: AC | PRN

## 2023-03-12 MED ORDER — HYDROMORPHONE HCL 1 MG/ML IJ SOLN
0.5000 mg | Freq: Once | INTRAMUSCULAR | Status: AC
  Administered 2023-03-12: 0.5 mg via INTRAVENOUS
  Filled 2023-03-12: qty 0.5

## 2023-03-12 MED ORDER — IOHEXOL 300 MG/ML  SOLN
100.0000 mL | Freq: Once | INTRAMUSCULAR | Status: AC | PRN
  Administered 2023-03-12: 100 mL via INTRAVENOUS

## 2023-03-12 NOTE — Discharge Instructions (Addendum)
Your workup was reassuring you can take Tylenol 1 g every 8 hours to help with pain and the Zofran help with nausea return to the ER if develop fevers over 100.4, worsening pain or any other concerns but at this time suspicion is that this is more likely viral in nature.  You do have blood cultures that are pending so please keep your phone on you because if these are positive we will call you back to be reevaluated.   IMPRESSION: Mesenteric adenitis, similar although less extensive than seen on a 2019 comparison. No bowel wall edema or obstruction.

## 2023-03-12 NOTE — ED Provider Notes (Addendum)
[facility] Provider Note    Event Date/Time   First MD Initiated Contact with Patient 03/12/23 1038     (approximate)   History   Abdominal Pain   HPI  Regina Carpenter is a 47 y.o. female with prior PE on Eliquis, rheumatoid arthritis, collagen vascular disease, inflammatory polyarthritis who comes in with concerns for abdominal pain.  On review of records patient was seen yesterday for body aches patient had reassuring workup except for white count of 14 chest x-ray was reassuring patient was feeling better with medications and so went home patient comes back today for abdominal pain.  Patient reports 3 days of abdominal pain.  She reports it is all over her abdomen and.  She reports it is constant.  She denies any falls hitting her head, headaches, shortness of breath, chest pain.  No swelling her legs.  Denies prior abdominal surgeries.  Denies any vaginal discharge.  Physical Exam   Triage Vital Signs: ED Triage Vitals  Enc Vitals Group     BP 03/12/23 1000 131/88     Pulse Rate 03/12/23 1000 98     Resp 03/12/23 1000 16     Temp 03/12/23 1000 98.5 F (36.9 C)     Temp Source 03/12/23 1000 Oral     SpO2 03/12/23 1000 100 %     Weight 03/12/23 1004 242 lb (109.8 kg)     Height 03/12/23 1004 5\' 5"  (1.651 m)     Head Circumference --      Peak Flow --      Pain Score 03/12/23 1003 10     Pain Loc --      Pain Edu? --      Excl. in GC? --     Most recent vital signs: Vitals:   03/12/23 1000  BP: 131/88  Pulse: 98  Resp: 16  Temp: 98.5 F (36.9 C)  SpO2: 100%     General: Awake, no distress.  CV:  Good peripheral perfusion.  Resp:  Normal effort.  Abd:  No distention.  Soft and slightly tender throughout abdomen. Other:     ED Results / Procedures / Treatments   Labs (all labs ordered are listed, but only abnormal results are displayed) Labs Reviewed  CBC WITH DIFFERENTIAL/PLATELET  COMPREHENSIVE METABOLIC PANEL  LIPASE,  BLOOD  HCG, QUANTITATIVE, PREGNANCY  URINALYSIS, ROUTINE W REFLEX MICROSCOPIC  POC URINE PREG, ED  TROPONIN I (HIGH SENSITIVITY)     EKG  My interpretation of EKG:  Normal sinus rate of 75 without any ST elevation, T wave inversion in lead III, normal intervals  RADIOLOGY I have reviewed the CT personally interpreted and no kidney stone    PROCEDURES:  Critical Care performed: No  Procedures   MEDICATIONS ORDERED IN ED: Medications  HYDROmorphone (DILAUDID) injection 0.5 mg (0.5 mg Intravenous Given 03/12/23 1128)  ondansetron (ZOFRAN) injection 4 mg (4 mg Intravenous Given 03/12/23 1128)  iohexol (OMNIPAQUE) 300 MG/ML solution 100 mL (100 mLs Intravenous Contrast Given 03/12/23 1221)     IMPRESSION / MDM / ASSESSMENT AND PLAN / ED COURSE  I reviewed the triage vital signs and the nursing notes.   Patient's presentation is most consistent with acute presentation with potential threat to life or bodily function.   Patient comes in with acute abdominal pain.  Will get CT imaging to evaluate for appendicitis, obstruction, perforation.  Patient given some IV fluids IV pain medicine IV Zofran.  Repeat labs were ordered.  hCG was negative.  White count slightly elevated at 14 but similar to 1 day ago little bit of a left shift.  CMP reassuring lipase is normal troponin is negative.  IMPRESSION: Mesenteric adenitis, similar although less extensive than seen on a 2019 comparison. No bowel wall edema or obstruction.  1:14 PM repeat evaluation patient reports feeling better.  Updated on the results.  Chest x-ray yesterday that evidence of pneumonia.  Will get urine to make sure no UTI given this was not checked yesterday.  2:43 PM reevaluated patient she reports feeling much better.  Tolerated the Toradol has allergy to ibuprofen   Patient reports feeling better we discussed patient's otherwise looking very reassuring her repeat abdominal exam soft nontender she looks well-hydrated.   She denies any diarrhea discussed just any stool studies.  We discussed return to the ER she develops any fevers at home or worsening symptoms or other concerns.  She denies any shortness of breath, cough, falls hitting her head or any scratches to suggest other causes for her illness and that this time it seems more likely viral in nature. Pt does not appear bacteremic- well appearing.  Did examine her feet given she does have a history of cellulitis, there is no signs for infection here. She denies any other symptoms other than the abdominal pain at this time no neck stiffness suggest meningitis.  She is on prednisone which could cause some of her white count elevation.  This time she looks very well-appearing does not appear septic and she feels comfortable with discharge home and tolerating PO.  However given she is immunosuppressed she is willing to do 2 blood cultures before going home just to that if there is any additional issues that we could call her back if they are positive at this time suspicion is that this is most likely viral in nature   FINAL CLINICAL IMPRESSION(S) / ED DIAGNOSES   Final diagnoses:  Mesenteric adenitis     Rx / DC Orders   ED Discharge Orders          Ordered    ondansetron (ZOFRAN-ODT) 4 MG disintegrating tablet  Every 8 hours PRN        03/12/23 1513             Note:  This document was prepared using Dragon voice recognition software and may include unintentional dictation errors.   Concha Se, MD 03/12/23 1450    Concha Se, MD 03/12/23 [phone]

## 2023-03-12 NOTE — ED Triage Notes (Signed)
Pt to ER with c/o abdominal pain.  Seen here yesterday for same.  States she called [facility] today who told her to return to ER for "a scan to make sure my intestines are not swollen and leaking again."

## 2023-03-14 LAB — CULTURE, BLOOD (ROUTINE X 2): Culture: NO GROWTH

## 2023-03-15 LAB — CULTURE, BLOOD (ROUTINE X 2)

## 2023-03-16 LAB — CULTURE, BLOOD (ROUTINE X 2): Special Requests: ADEQUATE

## 2023-03-18 LAB — CULTURE, BLOOD (ROUTINE X 2)
Culture: NO GROWTH
Special Requests: ADEQUATE

## 2023-04-01 ENCOUNTER — Ambulatory Visit: Payer: Self-pay
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
## 2023-06-22 ENCOUNTER — Emergency Department
Admission: EM | Admit: 2023-06-22 | Discharge: 2023-06-22 | Disposition: A | Discharge status: Home / Self Care | Payer: Medicaid Other | Attending: Student in an Organized Health Care Education/Training Program | Admitting: Student in an Organized Health Care Education/Training Program

## 2023-06-22 DIAGNOSIS — U071 COVID-19: Secondary | ICD-10-CM | POA: Insufficient documentation

## 2023-06-22 DIAGNOSIS — J069 Acute upper respiratory infection, unspecified: Secondary | ICD-10-CM | POA: Diagnosis not present

## 2023-06-22 DIAGNOSIS — R197 Diarrhea, unspecified: Secondary | ICD-10-CM

## 2023-06-22 LAB — COMPREHENSIVE METABOLIC PANEL WITH GFR
ALT: 18 U/L (ref 0–44)
AST: 21 U/L (ref 15–41)
Albumin: 4.1 g/dL (ref 3.5–5.0)
Alkaline Phosphatase: 54 U/L (ref 38–126)
Anion gap: 9 (ref 5–15)
BUN: 16 mg/dL (ref 6–20)
CO2: 21 mmol/L — ABNORMAL LOW (ref 22–32)
Calcium: 9.1 mg/dL (ref 8.9–10.3)
Chloride: 105 mmol/L (ref 98–111)
Creatinine, Ser: 0.86 mg/dL (ref 0.44–1.00)
GFR, Estimated: >60 mL/min (ref >60)
Glucose, Bld: 124 mg/dL — ABNORMAL HIGH (ref 70–99)
Potassium: 3.5 mmol/L (ref 3.5–5.1)
Sodium: 135 mmol/L (ref 135–145)
Total Bilirubin: 1.1 mg/dL (ref 0.3–1.2)
Total Protein: 8.7 g/dL — ABNORMAL HIGH (ref 6.5–8.1)

## 2023-06-22 LAB — CBC
HCT: 45.1 % (ref 36.0–46.0)
Hemoglobin: 14.6 g/dL (ref 12.0–15.0)
MCH: 30.9 pg (ref 26.0–34.0)
MCHC: 32.4 g/dL (ref 30.0–36.0)
MCV: 95.3 fL (ref 80.0–100.0)
Platelets: 233 10*3/uL (ref 150–400)
RBC: 4.73 MIL/uL (ref 3.87–5.11)
RDW: 15.6 % — ABNORMAL HIGH (ref 11.5–15.5)
WBC: 9.1 10*3/uL (ref 4.0–10.5)
nRBC: 0 % (ref 0.0–0.2)

## 2023-06-22 LAB — SARS CORONAVIRUS 2 BY RT PCR: SARS Coronavirus 2 by RT PCR: POSITIVE — AB

## 2023-06-22 LAB — LIPASE, BLOOD: Lipase: 29 U/L (ref 11–51)

## 2023-06-22 MED ORDER — SODIUM CHLORIDE 0.9 % IV BOLUS
1000.0000 mL | Freq: Once | INTRAVENOUS | Status: AC
  Administered 2023-06-22: 1000 mL via INTRAVENOUS

## 2023-06-22 NOTE — ED Triage Notes (Signed)
First nurse note: Pt to ED via [facility] from home. Pt reports weakness and diarrhea since Friday.   EMS VS  98.2  HR 80 CBG 114 141/115 99% RA

## 2023-06-22 NOTE — ED Provider Notes (Signed)
[facility] Provider Note    Event Date/Time   First MD Initiated Contact with Patient 06/22/23 1325     (approximate)   History   URI   HPI  Regina Carpenter is a 47 y.o. female presents to the emergency department via EMS for cough congestion and bodyaches and diarrhea for several days.  Patient states she has not had any vomiting and does not feel nauseated.  States however she does not have any appetite and every time she does eat it goes straight through her.  Has not tried any Imodium.  Denies fever/chills.  Denies burning with urination      Physical Exam   Triage Vital Signs: ED Triage Vitals  Encounter Vitals Group     BP 06/22/23 1218 (!) 116/98     Systolic BP Percentile --      Diastolic BP Percentile --      Pulse Rate 06/22/23 1218 75     Resp 06/22/23 1218 20     Temp 06/22/23 1218 98.7 F (37.1 C)     Temp src --      SpO2 06/22/23 1218 96 %     Weight --      Height --      Head Circumference --      Peak Flow --      Pain Score 06/22/23 1217 10     Pain Loc --      Pain Education --      Exclude from Growth Chart --     Most recent vital signs: Vitals:   06/22/23 1218  BP: (!) 116/98  Pulse: 75  Resp: 20  Temp: 98.7 F (37.1 C)  SpO2: 96%     General: Awake, no distress.   CV:  Good peripheral perfusion. regular rate and  rhythm Resp:  Normal effort. Lungs cta Abd:  No distention.  Nontender, bowel sounds hypoactive all 4 quads Other:      ED Results / Procedures / Treatments   Labs (all labs ordered are listed, but only abnormal results are displayed) Labs Reviewed  SARS CORONAVIRUS 2 BY RT PCR - Abnormal; Notable for the following components:      Result Value   SARS Coronavirus 2 by RT PCR POSITIVE (*)    All other components within normal limits  COMPREHENSIVE METABOLIC PANEL - Abnormal; Notable for the following components:   CO2 21 (*)    Glucose, Bld 124 (*)    Total Protein 8.7 (*)    All  other components within normal limits  CBC - Abnormal; Notable for the following components:   RDW 15.6 (*)    All other components within normal limits  LIPASE, BLOOD  URINALYSIS, ROUTINE W REFLEX MICROSCOPIC     EKG     RADIOLOGY     PROCEDURES:   Procedures   MEDICATIONS ORDERED IN ED: Medications  sodium chloride 0.9 % bolus 1,000 mL (0 mLs Intravenous Stopped 06/22/23 1605)     IMPRESSION / MDM / ASSESSMENT AND PLAN / ED COURSE  I reviewed the triage vital signs and the nursing notes.                              Differential diagnosis includes, but is not limited to, gastroenteritis, colitis, viral illness, COVID, dehydration, UTI  Patient's presentation is most consistent with acute illness / injury with system symptoms.   CBC metabolic  panel lipase are reassuring  Patient still does not have the urgency to urinate so we will do normal saline 1 L IV  Covid and UA pending  COVID test positive.  Will cancel the UA.  Patient did urinate while here in the ED and has no burning with urination.  She is to stay home until feeling better.  Imodium A-D for the diarrhea.  Follow-up with her regular doctor if not improving 3 to 4 days.  Return if worsening.  Patient is in agreement treatment plan.  Discharged stable condition.      FINAL CLINICAL IMPRESSION(S) / ED DIAGNOSES   Final diagnoses:  COVID  Diarrhea, unspecified type     Rx / DC Orders   ED Discharge Orders     None        Note:  This document was prepared using Dragon voice recognition software and may include unintentional dictation errors.    Faythe Ghee, PA-C 06/22/23 1627    Willy Eddy, MD 06/23/23 [phone]

## 2023-06-22 NOTE — ED Triage Notes (Signed)
Pt reports congestion, chills, decreased PO, body aches, and diarrhea since Friday.

## 2023-09-15 ENCOUNTER — Emergency Department: Payer: Medicaid Other

## 2023-09-15 ENCOUNTER — Encounter: Payer: Self-pay | Admitting: Emergency Medicine

## 2023-09-15 ENCOUNTER — Emergency Department
Admission: EM | Admit: 2023-09-15 | Discharge: 2023-09-15 | Disposition: A | Discharge status: Home / Self Care | Payer: Medicaid Other | Attending: Emergency Medicine | Admitting: Emergency Medicine

## 2023-09-15 ENCOUNTER — Other Ambulatory Visit: Payer: Self-pay

## 2023-09-15 DIAGNOSIS — M546 Pain in thoracic spine: Secondary | ICD-10-CM | POA: Diagnosis not present

## 2023-09-15 DIAGNOSIS — M542 Cervicalgia: Secondary | ICD-10-CM | POA: Insufficient documentation

## 2023-09-15 DIAGNOSIS — I10 Essential (primary) hypertension: Secondary | ICD-10-CM | POA: Diagnosis not present

## 2023-09-15 DIAGNOSIS — M25511 Pain in right shoulder: Secondary | ICD-10-CM | POA: Diagnosis not present

## 2023-09-15 MED ORDER — LIDOCAINE 5 % EX PTCH
1.0000 | MEDICATED_PATCH | CUTANEOUS | Status: DC
  Administered 2023-09-15: 1 via TRANSDERMAL
  Filled 2023-09-15: qty 1

## 2023-09-15 MED ORDER — KETOROLAC TROMETHAMINE 15 MG/ML IJ SOLN
15.0000 mg | Freq: Once | INTRAMUSCULAR | Status: AC
  Administered 2023-09-15: 15 mg via INTRAMUSCULAR
  Filled 2023-09-15: qty 1

## 2023-09-15 MED ORDER — LIDOCAINE 5 % EX PTCH: 1.0000 | MEDICATED_PATCH | Freq: Two times a day (BID) | CUTANEOUS | 0 refills | Status: AC

## 2023-09-15 NOTE — ED Provider Notes (Signed)
[facility] Provider Note    Event Date/Time   First MD Initiated Contact with Patient 09/15/23 [phone]     (approximate)   History   Back Pain and Neck Pain   HPI  Regina Carpenter is a 47 y.o. 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  Patient denies any trauma or injuries to this area.  She reports that she still able to move her neck and has no paresthesias in her arms.  She reports that she has pain to her right shoulder which feels like her previous rotator cuff injury.  No fevers or chills.  No constitutional or infectious type symptoms.  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   Essential hypertension 05/17/2020   Smoking 05/17/2020   Sepsis (HCC) 05/16/2020   Hyponatremia 11/25/2016   Hypokalemia 11/25/2016   Leukocytosis 11/25/2016   Generalized weakness 11/25/2016   Generalized pain 11/25/2016   Conjunctivitis 11/25/2016   Scleritis and episcleritis of right eye    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   Abdominal pain 11/14/2016   Lymphadenopathy, mesenteric    Arthralgia of right knee 04/02/2015   Cellulitis of left leg 03/21/2015   Right hand pain 03/21/2015   Tobacco use 03/21/2015          Physical Exam   Triage Vital Signs: ED Triage Vitals  Encounter Vitals Group     BP 09/15/23 0853 (!) 131/97     Systolic BP Percentile --      Diastolic BP Percentile --      Pulse Rate 09/15/23 0853 76     Resp 09/15/23 0853 16     Temp 09/15/23 0853 98.4 F (36.9 C)     Temp Source 09/15/23 0853 Oral     SpO2 09/15/23 0853 100 %     Weight 09/15/23 0854 250 lb  (113.4 kg)     Height 09/15/23 0854 5\' 5"  (1.651 m)     Head Circumference --      Peak Flow --      Pain Score 09/15/23 0854 10     Pain Loc --      Pain Education --      Exclude from Growth Chart --     Most recent vital signs: Vitals:   09/15/23 0853 09/15/23 1017  BP: (!) 131/97 107/70  Pulse: 76   Resp: 16 15  Temp: 98.4 F (36.9 C) 97.6 F (36.4 C)  SpO2: 100% 100%    Physical Exam Vitals and nursing note reviewed.  Constitutional:      General: Awake and alert. No acute distress.    Appearance: Normal appearance. The patient is normal weight.  HENT:     Head: Normocephalic and atraumatic.     Mouth: Mucous membranes are moist.  Eyes:     General: PERRL. Normal EOMs        Right eye: No discharge.        Left eye: No discharge.     Conjunctiva/sclera: Conjunctivae normal.  Cardiovascular:     Rate and Rhythm: Normal rate and regular rhythm.     Pulses: Normal pulses.  Pulmonary:  Effort: Pulmonary effort is normal. No respiratory distress.     Breath sounds: Normal breath sounds.  Abdominal:     Abdomen is soft. There is no abdominal tenderness. No rebound or guarding. No distention. Musculoskeletal:        General: No swelling. Normal range of motion.     Cervical back: Normal range of motion and neck supple. No midline cervical spine tenderness.  Tenderness to palpation to bilateral cervical paraspinal muscles.  No skin changes, no erythema or ecchymosis or swelling.  Full range of motion of neck.  Negative Spurling test.  Negative Lhermitte sign.  Normal strength and sensation in bilateral upper extremities. Normal grip strength bilaterally.  Normal intrinsic muscle function of the hand bilaterally.  Normal radial pulses bilaterally. Right shoulder: no obvious deformity, swelling, ecchymosis, or erythema No clavicular or AC joint tenderness Able to actively and passively forward flex and abduct at shoulder fully though reports pain with doing so, negative  drop arm test Normal internal and external rotation against resistance Normal ROM at elbow and wrist Normal resisted pronation and supination 2+ radial pulse Normal grip strength Normal intrinsic hand muscle function Skin:    General: Skin is warm and dry.     Capillary Refill: Capillary refill takes less than 2 seconds.     Findings: No rash.  Neurological:     Mental Status: The patient is awake and alert.      ED Results / Procedures / Treatments   Labs (all labs ordered are listed, but only abnormal results are displayed) Labs Reviewed - No data to display   EKG     RADIOLOGY I independently reviewed and interpreted imaging and agree with radiologists findings.     PROCEDURES:  Critical Care performed:   Procedures   MEDICATIONS ORDERED IN ED: Medications  lidocaine (LIDODERM) 5 % 1 patch (1 patch Transdermal Patch Applied 09/15/23 0941)  ketorolac (TORADOL) 15 MG/ML injection 15 mg (15 mg Intramuscular Given 09/15/23 0943)     IMPRESSION / MDM / ASSESSMENT AND PLAN / ED COURSE  I reviewed the triage vital signs and the nursing notes.   Differential diagnosis includes, but is not limited to, rheumatoid flare, muscle strain, muscle spasm.  Patient is awake and alert, hemodynamically stable and afebrile.  No tachycardia or hypoxia, no chest pain or shortness of breath, not consistent with pulmonary embolism.  She has normal and equal strength in bilateral upper extremities, normal grip strength, normal intrinsic muscle function of the hands bilaterally.  There are no constitutional or infectious type symptoms or signs on exam.  However, given that she is immunocompromise, CT of her neck obtained and XR shoulder, both of which were normal without acute findings.  Patient was treated symptomatically with Toradol.  She was given Toradol and 03/11/2023 without adverse effect. Patient reports improvement after this. Recommended close outpatient follow up and strict  return precautions. Patient understands and agrees with plan. Discharged in stable condition.  Patient's presentation is most consistent with acute complicated illness / injury requiring diagnostic workup.     FINAL CLINICAL IMPRESSION(S) / ED DIAGNOSES   Final diagnoses:  Neck pain  Acute pain of right shoulder     Rx / DC Orders   ED Discharge Orders          Ordered    lidocaine (LIDODERM) 5 %  Every 12 hours        09/15/23 1012  Note:  This document was prepared using Dragon voice recognition software and may include unintentional dictation errors.   Jackelyn Hoehn, PA-C 09/15/23 1343    Janith Lima, MD 09/16/23 [phone]

## 2023-09-15 NOTE — ED Triage Notes (Signed)
Pt to ED via POV, pt states that she has been having neck and upper back pain for the last 3 -4 days. Pt reports that it is getting harder for her to be able to lay down. Pt also reports pain in the right shoulder and states that her rotator cuff is bothering her again. Pt is in NAD.

## 2023-09-15 NOTE — Discharge Instructions (Addendum)
Your CT and X-ray are normal. Please follow up with your outpatient provider. Please return for any new, worsening, or change in symptoms or other concerns. It was a pleasure caring for you today.

## 2023-09-25 ENCOUNTER — Emergency Department: Payer: Medicaid Other

## 2023-09-25 ENCOUNTER — Encounter: Payer: Self-pay | Admitting: Emergency Medicine

## 2023-09-25 ENCOUNTER — Emergency Department
Admission: EM | Admit: 2023-09-25 | Discharge: 2023-09-25 | Disposition: A | Discharge status: Home / Self Care | Payer: Medicaid Other | Attending: Emergency Medicine | Admitting: Emergency Medicine

## 2023-09-25 ENCOUNTER — Other Ambulatory Visit: Payer: Self-pay

## 2023-09-25 DIAGNOSIS — Z79899 Other long term (current) drug therapy: Secondary | ICD-10-CM | POA: Diagnosis not present

## 2023-09-25 DIAGNOSIS — Z7901 Long term (current) use of anticoagulants: Secondary | ICD-10-CM | POA: Diagnosis not present

## 2023-09-25 DIAGNOSIS — F17200 Nicotine dependence, unspecified, uncomplicated: Secondary | ICD-10-CM | POA: Insufficient documentation

## 2023-09-25 DIAGNOSIS — R079 Chest pain, unspecified: Secondary | ICD-10-CM

## 2023-09-25 DIAGNOSIS — E878 Other disorders of electrolyte and fluid balance, not elsewhere classified: Secondary | ICD-10-CM | POA: Diagnosis not present

## 2023-09-25 DIAGNOSIS — J984 Other disorders of lung: Secondary | ICD-10-CM

## 2023-09-25 DIAGNOSIS — D72829 Elevated white blood cell count, unspecified: Secondary | ICD-10-CM | POA: Diagnosis not present

## 2023-09-25 DIAGNOSIS — R0789 Other chest pain: Secondary | ICD-10-CM | POA: Diagnosis present

## 2023-09-25 DIAGNOSIS — E872 Acidosis, unspecified: Secondary | ICD-10-CM | POA: Insufficient documentation

## 2023-09-25 DIAGNOSIS — I10 Essential (primary) hypertension: Secondary | ICD-10-CM | POA: Diagnosis not present

## 2023-09-25 DIAGNOSIS — M25512 Pain in left shoulder: Secondary | ICD-10-CM | POA: Diagnosis not present

## 2023-09-25 LAB — HCG, QUANTITATIVE, PREGNANCY: hCG, Beta Chain, Quant, S: 1 m[IU]/mL (ref <5)

## 2023-09-25 LAB — CBC WITH DIFFERENTIAL/PLATELET
Abs Immature Granulocytes: 0.04 10*3/uL (ref 0.00–0.07)
Basophils Absolute: 0 10*3/uL (ref 0.0–0.1)
Basophils Relative: 0 %
Eosinophils Absolute: 0.2 10*3/uL (ref 0.0–0.5)
Eosinophils Relative: 2 %
HCT: 40.3 % (ref 36.0–46.0)
Hemoglobin: 13.2 g/dL (ref 12.0–15.0)
Immature Granulocytes: 0 %
Lymphocytes Relative: 43 %
Lymphs Abs: 5.1 10*3/uL — ABNORMAL HIGH (ref 0.7–4.0)
MCH: 31.1 pg (ref 26.0–34.0)
MCHC: 32.8 g/dL (ref 30.0–36.0)
MCV: 95 fL (ref 80.0–100.0)
Monocytes Absolute: 0.9 10*3/uL (ref 0.1–1.0)
Monocytes Relative: 8 %
Neutro Abs: 5.5 10*3/uL (ref 1.7–7.7)
Neutrophils Relative %: 47 %
Platelets: 245 10*3/uL (ref 150–400)
RBC: 4.24 MIL/uL (ref 3.87–5.11)
RDW: 14.9 % (ref 11.5–15.5)
WBC: 11.8 10*3/uL — ABNORMAL HIGH (ref 4.0–10.5)
nRBC: 0 % (ref 0.0–0.2)

## 2023-09-25 LAB — BASIC METABOLIC PANEL
Anion gap: 6 (ref 5–15)
Anion gap: 9 (ref 5–15)
BUN: 13 mg/dL (ref 6–20)
BUN: 15 mg/dL (ref 6–20)
CO2: 18 mmol/L — ABNORMAL LOW (ref 22–32)
CO2: 26 mmol/L (ref 22–32)
Calcium: 6.7 mg/dL — ABNORMAL LOW (ref 8.9–10.3)
Calcium: 9.1 mg/dL (ref 8.9–10.3)
Chloride: 115 mmol/L — ABNORMAL HIGH (ref 98–111)
Chloride: 103 mmol/L (ref 98–111)
Creatinine, Ser: 0.56 mg/dL (ref 0.44–1.00)
Creatinine, Ser: 0.89 mg/dL (ref 0.44–1.00)
GFR, Estimated: >60 mL/min (ref >60)
GFR, Estimated: >60 mL/min (ref >60)
Glucose, Bld: 73 mg/dL (ref 70–99)
Glucose, Bld: 88 mg/dL (ref 70–99)
Potassium: 2.4 mmol/L — CL (ref 3.5–5.1)
Potassium: 3.5 mmol/L (ref 3.5–5.1)
Sodium: 139 mmol/L (ref 135–145)
Sodium: 138 mmol/L (ref 135–145)

## 2023-09-25 LAB — MAGNESIUM: Magnesium: 2.3 mg/dL (ref 1.7–2.4)

## 2023-09-25 LAB — TROPONIN I (HIGH SENSITIVITY): Troponin I (High Sensitivity): 4 ng/L (ref <18)

## 2023-09-25 MED ORDER — AZITHROMYCIN 250 MG PO TABS: ORAL_TABLET | ORAL | 0 refills | Status: AC

## 2023-09-25 MED ORDER — POTASSIUM CHLORIDE CRYS ER 20 MEQ PO TBCR: 40.0000 meq | EXTENDED_RELEASE_TABLET | Freq: Once | ORAL | Status: DC

## 2023-09-25 MED ORDER — POTASSIUM CHLORIDE 10 MEQ/100ML IV SOLN: 10.0000 meq | INTRAVENOUS | Status: DC

## 2023-09-25 MED ORDER — KETOROLAC TROMETHAMINE 30 MG/ML IJ SOLN
30.0000 mg | Freq: Once | INTRAMUSCULAR | Status: AC
  Administered 2023-09-25: 30 mg via INTRAVENOUS
  Filled 2023-09-25: qty 1

## 2023-09-25 MED ORDER — IOHEXOL 350 MG/ML SOLN
75.0000 mL | Freq: Once | INTRAVENOUS | Status: AC | PRN
  Administered 2023-09-25: 75 mL via INTRAVENOUS

## 2023-09-25 MED ORDER — HYDROMORPHONE HCL 1 MG/ML IJ SOLN
1.0000 mg | Freq: Once | INTRAMUSCULAR | Status: AC
  Administered 2023-09-25: 1 mg via INTRAVENOUS
  Filled 2023-09-25: qty 1

## 2023-09-25 MED ORDER — OXYCODONE-ACETAMINOPHEN 5-325 MG PO TABS: 1.0000 | ORAL_TABLET | ORAL | 0 refills | Status: DC | PRN

## 2023-09-25 MED ORDER — OXYCODONE-ACETAMINOPHEN 5-325 MG PO TABS
1.0000 | ORAL_TABLET | Freq: Once | ORAL | Status: AC
  Administered 2023-09-25: 1 via ORAL
  Filled 2023-09-25: qty 1

## 2023-09-25 MED ORDER — DEXAMETHASONE SODIUM PHOSPHATE 10 MG/ML IJ SOLN
10.0000 mg | Freq: Once | INTRAMUSCULAR | Status: AC
  Administered 2023-09-25: 10 mg via INTRAVENOUS
  Filled 2023-09-25: qty 1

## 2023-09-25 MED ORDER — ONDANSETRON HCL 4 MG/2ML IJ SOLN
4.0000 mg | Freq: Once | INTRAMUSCULAR | Status: AC
  Administered 2023-09-25: 4 mg via INTRAVENOUS
  Filled 2023-09-25: qty 2

## 2023-09-25 NOTE — ED Provider Notes (Signed)
-----------------------------------------   9:26 AM on 09/25/2023 ----------------------------------------- Patient's workup is overall reassuring.  CBC is normal magnesium is normal.  Repeat chemistry shows normal results suspect error on first lab draw.  It has been repeated a third time which reflect normal results.  Troponin is negative, pregnancy test negative.  CTA of the chest does show some possible bronchitis or atypical pneumonia but no obvious PE.  Shoulder x-ray is negative.  Will treat with Zithromax given the CTA findings we will also place on a short course of increased pain medication of her baseline.  Discussed with the patient she could take up to 10 mg of oxycodone every 8 hours for the next few days as she takes 5 mg chronically.  Patient agreeable to plan.   Minna Antis, MD 09/25/23 [phone]

## 2023-09-25 NOTE — ED Triage Notes (Signed)
Pt presents to the ED via [facility] with complaints of L shoulder pain x 3 weeks. Pt was seen here previously for same and was told it was arthritis. Per imaging the patients R shoulder was scanned but she is endorsing L shoulder pain and she states that the pain has been present there all along. She notes taking her prescribed pain meds around 2200. A&Ox4 at this time. Denies CP or SOB.

## 2023-09-25 NOTE — ED Provider Notes (Signed)
[facility] Provider Note    Event Date/Time   First MD Initiated Contact with Patient 09/25/23 [phone]     (approximate)   History   Shoulder Pain   HPI  Regina Carpenter is a 47 y.o. female with history of DVTs on Eliquis, hypertension, cutaneous polyarteritis nodosa, rheumatoid arthritis, chronic pain who presents to the emergency department with complaints of severe left-sided chest and shoulder pain.  She states it caused her's her to feel short of breath.  She denies any injury.  She has not been able to sleep due to pain.  She has taken oxycodone, muscle relaxers, gabapentin at home without relief.  She denies any history of PE.  She reports compliance with Eliquis.  No fever or cough.   History provided by patient.    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 Authorizing Provider  albuterol (VENTOLIN HFA) 108 (90 Base) MCG/ACT inhaler Inhale 2 puffs into the lungs every 6 (six) hours as needed for wheezing or shortness of breath. 12/17/21   Dionne Bucy, MD  amLODipine (NORVASC) 5 MG tablet Take 1 tablet (5 mg total) by mouth daily. 07/25/20   Rolly Salter, MD  amLODipine (NORVASC) 5 MG tablet Take by mouth.    [provider]   collagenase (SANTYL) ointment Apply topically daily. Patient not taking: Reported on 03/30/2021 07/25/20   Rolly Salter, MD  cyclophosphamide (CYTOXAN) 25 MG capsule Take by mouth. Patient not taking: Reported on 03/30/2021 11/04/20   [provider]  cyclophosphamide (CYTOXAN) 50 MG capsule Take 2 capsules (100 mg total) by mouth daily. Take with food to minimize GI upset. Take early in the day and maintain hydration. 07/24/20   Rolly Salter, MD  dapsone 100 MG tablet Take 100 mg by mouth daily. 03/25/21   [provider]  ELIQUIS 5 MG TABS tablet Take 5 mg by mouth 2 (two) times daily. 03/25/21   [provider]  famotidine (PEPCID) 20 MG tablet Take 1 tablet (20 mg total) by mouth daily. Patient not taking: Reported on 03/30/2021 07/25/20   Rolly Salter, MD  famotidine (PEPCID) 20 MG tablet Take by mouth. Patient not taking: Reported on 03/30/2021    [provider]  gabapentin (NEURONTIN) 600 MG tablet Take 0.5 tablets (300 mg total) by mouth 3 (three) times daily. Patient not taking: Reported on 03/30/2021 07/24/20   Rolly Salter, MD  guaiFENesin 200 MG tablet Take 2 tablets (400 mg total) by mouth every 6 (six) hours as needed for cough or to loosen phlegm. 12/17/21   Dionne Bucy, MD  ketorolac (ACULAR) 0.5 % ophthalmic solution Place 1 drop into the left eye 4 (four) times daily. Patient not taking: Reported  on 03/30/2021 08/31/20   Tommi Rumps, PA-C  nicotine (NICODERM CQ - DOSED IN MG/24 HOURS) 14 mg/24hr patch Place 1 patch (14 mg total) onto the skin daily. 07/25/20   Rolly Salter, MD  nicotine (NICODERM CQ - DOSED IN MG/24 HOURS) 14 mg/24hr patch Place onto the skin.    [provider]  Oxycodone HCl 10 MG TABS Take by mouth. 08/25/20   [provider]  oxyCODONE-acetaminophen (PERCOCET) 5-325 MG tablet Take 1 tablet by mouth every 6 (six) hours as needed for severe pain. Patient not taking: Reported on  03/30/2021 07/25/20   Rolly Salter, MD  predniSONE (DELTASONE) 10 MG tablet Take 50mg  daily for 3days,Take 40mg  daily for 3days,Take 30mg  daily for 3days,Take 20mg  daily 07/24/20   Rolly Salter, MD  saccharomyces boulardii (FLORASTOR) 250 MG capsule Take 1 capsule (250 mg total) by mouth 2 (two) times daily. 07/25/20   Rolly Salter, MD    Physical Exam   Triage Vital Signs: ED Triage Vitals  Encounter Vitals Group     BP 09/25/23 0107 137/88     Systolic BP Percentile --      Diastolic BP Percentile --      Pulse Rate 09/25/23 0107 84     Resp 09/25/23 0107 18     Temp 09/25/23 0107 98 F (36.7 C)     Temp src --      SpO2 09/25/23 0107 100 %     Weight 09/25/23 0106 250 lb (113.4 kg)     Height 09/25/23 0106 5\' 5"  (1.651 m)     Head Circumference --      Peak Flow --      Pain Score 09/25/23 0104 9     Pain Loc --      Pain Education --      Exclude from Growth Chart --     Most recent vital signs: Vitals:   09/25/23 0107 09/25/23 0526  BP: 137/88 124/85  Pulse: 84 82  Resp: 18 17  Temp: 98 F (36.7 C) 98.1 F (36.7 C)  SpO2: 100% 96%    CONSTITUTIONAL: Alert, responds appropriately to questions.  Patient appears uncomfortable and is tearful HEAD: Normocephalic, atraumatic EYES: Conjunctivae clear, pupils appear equal, sclera nonicteric ENT: normal nose; moist mucous membranes NECK: Supple, normal ROM no midline spinal tenderness or step-off or deformity, unable to reproduce tenderness with palpation over the trapezius muscles CARD: RRR; S1 and S2 appreciated, no tenderness to palpation over the chest wall and no crepitus, deformity or other lesions noted RESP: Normal chest excursion without splinting or tachypnea; breath sounds clear and equal bilaterally; no wheezes, no rhonchi, no rales, no hypoxia or respiratory distress, speaking full sentences ABD/GI: Non-distended; soft, non-tender, no rebound, no guarding, no peritoneal signs BACK: The back appears  normal EXT: Normal ROM in all joints; no deformity noted, no edema no calf tenderness or calf swelling, no specific tenderness to palpation over the left shoulder and she has full range of motion in this joint, compartments in the left arm are soft, 2+ left radial pulse SKIN: Normal color for age and race; warm; no rash on exposed skin NEURO: Moves all extremities equally, normal speech PSYCH: The patient's mood and manner are appropriate.   ED Results / Procedures / Treatments   LABS: (all labs ordered are listed, but only abnormal results are displayed) Labs Reviewed  CBC WITH DIFFERENTIAL/PLATELET - Abnormal; Notable for the following components:  Result Value   WBC 11.8 (*)    Lymphs Abs 5.1 (*)    All other components within normal limits  BASIC METABOLIC PANEL - Abnormal; Notable for the following components:   Potassium 2.4 (*)    Chloride 115 (*)    CO2 18 (*)    Calcium 6.7 (*)    All other components within normal limits  HCG, QUANTITATIVE, PREGNANCY  MAGNESIUM  BASIC METABOLIC PANEL  TROPONIN I (HIGH SENSITIVITY)     EKG:  EKG Interpretation Date/Time:  Sunday September 25 2023 05:21:54 EST Ventricular Rate:  71 PR Interval:  170 QRS Duration:  86 QT Interval:  386 QTC Calculation: 419 R Axis:   -23  Text Interpretation: Normal sinus rhythm Normal ECG When compared with ECG of 12-Mar-2023 11:49, No significant change was found Confirmed by Rochele Raring [phone]) on 09/25/2023 5:24:41 AM         RADIOLOGY: My personal review and interpretation of imaging: Left shoulder x-ray shows no acute abnormality.  I have personally reviewed all radiology reports.   DG Shoulder Left Result Date: 09/25/2023 CLINICAL DATA:  Left shoulder pain x3 weeks. EXAM: LEFT SHOULDER - 2+ VIEW COMPARISON:  August 06, 2019 FINDINGS: There is no evidence of fracture or dislocation. There is no evidence of arthropathy or other focal bone abnormality. Soft tissues are  unremarkable. IMPRESSION: Negative. Electronically Signed   By: Aram Candela M.D.   On: 09/25/2023 02:18     PROCEDURES:  Critical Care performed: No      .1-3 Lead EKG Interpretation  Performed by: Randall Colden, Layla Maw, DO Authorized by: Ciani Rutten, Layla Maw, DO     Interpretation: normal     ECG rate:  84   ECG rate assessment: normal     Rhythm: sinus rhythm     Ectopy: none     Conduction: normal       IMPRESSION / MDM / ASSESSMENT AND PLAN / ED COURSE  I reviewed the triage vital signs and the nursing notes.    Patient here with left-sided chest and shoulder pain.  The patient is on the cardiac monitor to evaluate for evidence of arrhythmia and/or significant heart rate changes.   DIFFERENTIAL DIAGNOSIS (includes but not limited to):   PE, pneumonia, chest wall pain, rib fracture, rotator cuff tendinitis, bursitis, Metoyer arthritis flare   Patient's presentation is most consistent with acute presentation with potential threat to life or bodily function.   PLAN: X-ray of the left shoulder obtained from triage and reviewed/interpreted by myself and the radiologist and is unremarkable.  Patient complains mostly of pain in the left anterior and lateral chest and behind the shoulder blade.  I am not able to reproduce pain with palpation or with movement of the left arm.  I am concerned that this could be a PE versus flare of her rheumatoid arthritis.  Will obtain labs, CTA of the chest.  Will give pain medication.  Patient was brought here by EMS.   MEDICATIONS GIVEN IN ED: Medications  HYDROmorphone (DILAUDID) injection 1 mg (1 mg Intravenous Given 09/25/23 0514)  ondansetron (ZOFRAN) injection 4 mg (4 mg Intravenous Given 09/25/23 0513)  HYDROmorphone (DILAUDID) injection 1 mg (1 mg Intravenous Given 09/25/23 0651)  ketorolac (TORADOL) 30 MG/ML injection 30 mg (30 mg Intravenous Given 09/25/23 0651)  dexamethasone (DECADRON) injection 10 mg (10 mg Intravenous Given  09/25/23 0651)     ED COURSE: Patient's labs show slight leukocytosis.  Potassium of 2.4 without EKG changes.  Patient also has mild hyperchloremia, metabolic acidosis with normal anion gap and hypocalcemia.  She has never had the significant electrolyte derangements before and denies vomiting, diarrhea.  Will recheck BMP prior to giving her potassium and calcium replacement.  Troponin is negative.  CTA of the chest pending.  Signed out to oncoming ED physician at 7 AM.   CONSULTS: Pending further workup.   OUTSIDE RECORDS REVIEWED: Reviewed last rheumatology note.       FINAL CLINICAL IMPRESSION(S) / ED DIAGNOSES   Final diagnoses:  Left-sided chest pain     Rx / DC Orders   ED Discharge Orders     None        Note:  This document was prepared using Dragon voice recognition software and may include unintentional dictation errors.   Jefrey Raburn, Layla Maw, DO 09/25/23 [phone]

## 2023-09-25 NOTE — Discharge Instructions (Addendum)
As we discussed you may take Percocet in addition to your chronic pain medication.  Do not take more than 10 mg of oxycodone every 8 hours in total.  Do not drink alcohol or drive while taking this medication.  Please follow-up with your doctor within the next 1 to 2 days for recheck/reevaluation.

## 2023-11-02 ENCOUNTER — Emergency Department: Payer: Medicaid Other

## 2023-11-02 ENCOUNTER — Other Ambulatory Visit: Payer: Self-pay

## 2023-11-02 DIAGNOSIS — I10 Essential (primary) hypertension: Secondary | ICD-10-CM | POA: Diagnosis not present

## 2023-11-02 DIAGNOSIS — Z7901 Long term (current) use of anticoagulants: Secondary | ICD-10-CM | POA: Insufficient documentation

## 2023-11-02 DIAGNOSIS — R0602 Shortness of breath: Secondary | ICD-10-CM | POA: Diagnosis not present

## 2023-11-02 DIAGNOSIS — Z20822 Contact with and (suspected) exposure to covid-19: Secondary | ICD-10-CM | POA: Diagnosis not present

## 2023-11-02 DIAGNOSIS — R0789 Other chest pain: Secondary | ICD-10-CM | POA: Insufficient documentation

## 2023-11-02 DIAGNOSIS — R0781 Pleurodynia: Secondary | ICD-10-CM | POA: Diagnosis present

## 2023-11-02 LAB — CBC WITH DIFFERENTIAL/PLATELET
Abs Immature Granulocytes: 0.02 10*3/uL (ref 0.00–0.07)
Basophils Absolute: 0 10*3/uL (ref 0.0–0.1)
Basophils Relative: 0 %
Eosinophils Absolute: 0.1 10*3/uL (ref 0.0–0.5)
Eosinophils Relative: 1 %
HCT: 39.4 % (ref 36.0–46.0)
Hemoglobin: 13 g/dL (ref 12.0–15.0)
Immature Granulocytes: 0 %
Lymphocytes Relative: 34 %
Lymphs Abs: 3.1 10*3/uL (ref 0.7–4.0)
MCH: 32.9 pg (ref 26.0–34.0)
MCHC: 33 g/dL (ref 30.0–36.0)
MCV: 99.7 fL (ref 80.0–100.0)
Monocytes Absolute: 0.5 10*3/uL (ref 0.1–1.0)
Monocytes Relative: 6 %
Neutro Abs: 5.3 10*3/uL (ref 1.7–7.7)
Neutrophils Relative %: 59 %
Platelets: 215 10*3/uL (ref 150–400)
RBC: 3.95 MIL/uL (ref 3.87–5.11)
RDW: 15.4 % (ref 11.5–15.5)
WBC: 9 10*3/uL (ref 4.0–10.5)
nRBC: 0 % (ref 0.0–0.2)

## 2023-11-02 LAB — RESP PANEL BY RT-PCR (RSV, FLU A&B, COVID)  RVPGX2
Influenza A by PCR: NEGATIVE
Influenza B by PCR: NEGATIVE
Resp Syncytial Virus by PCR: NEGATIVE
SARS Coronavirus 2 by RT PCR: NEGATIVE

## 2023-11-02 LAB — BASIC METABOLIC PANEL
Anion gap: 10 (ref 5–15)
BUN: 14 mg/dL (ref 6–20)
CO2: 20 mmol/L — ABNORMAL LOW (ref 22–32)
Calcium: 9.2 mg/dL (ref 8.9–10.3)
Chloride: 108 mmol/L (ref 98–111)
Creatinine, Ser: 0.76 mg/dL (ref 0.44–1.00)
GFR, Estimated: >60 mL/min (ref >60)
Glucose, Bld: 97 mg/dL (ref 70–99)
Potassium: 3.8 mmol/L (ref 3.5–5.1)
Sodium: 138 mmol/L (ref 135–145)

## 2023-11-02 NOTE — ED Triage Notes (Addendum)
Pt reports pain in ribs with coughing, pt states she was recently dx with PNA and finished course of abx but pt states she still feels bad, pt repots some sob with exertion.  Pain is worse with movement.

## 2023-11-02 NOTE — ED Provider Triage Note (Signed)
Emergency Medicine Provider Triage Evaluation Note  Regina Carpenter , a 48 y.o. female  was evaluated in triage.  Pt complains of shortness of breath, productive cough.  Patient was diagnosed with walking pneumonia.  Right lower thoracic is painful with deep inspiration.  Review of Systems  Positive:  Negative:   Physical Exam  Ht 5\' 5"  (1.651 m)   Wt 117.9 kg   BMI 43.27 kg/m  Gen:   Awake, no distress   Resp:  Normal effort, no wheezing, crackles MSK:   Moves extremities without difficulty  Other:    Medical Decision Making  Medically screening exam initiated at 9:18 PM.  Appropriate orders placed.  Regina Carpenter was informed that the remainder of the evaluation will be completed by another provider, this initial triage assessment does not replace that evaluation, and the importance of remaining in the ED until their evaluation is complete.  Patient with upper respiratory infection ordered a CBC, chest x-ray   Gladys Damme, PA-C 11/02/23 2120

## 2023-11-03 ENCOUNTER — Emergency Department
Admission: EM | Admit: 2023-11-03 | Discharge: 2023-11-03 | Disposition: A | Discharge status: Home / Self Care | Payer: Medicaid Other | Attending: Emergency Medicine | Admitting: Emergency Medicine

## 2023-11-03 DIAGNOSIS — R0789 Other chest pain: Secondary | ICD-10-CM

## 2023-11-03 MED ORDER — METHOCARBAMOL 500 MG PO TABS: 500.0000 mg | ORAL_TABLET | Freq: Four times a day (QID) | ORAL | 0 refills | Status: AC | PRN

## 2023-11-03 NOTE — Discharge Instructions (Signed)
You were seen in the Emergency Department today for evaluation of your chest pain. Fortunately, your labs, EKG, and chest x-Mumtaz Lovins were overall reassuring against a emergency cause for your pain.  I suspect your pain is likely related to musculoskeletal strain as well as possible inflammation of your lung known as pleurisy. I sent a prescription for a muscle relaxer to your pharmacy, do not take any other muscle relaxers with this.  You can continue to use your multiple medications as directed.  Follow-up your outpatient doctors for further evaluation.  Return to the ER for new or worsening symptoms.

## 2023-11-03 NOTE — ED Provider Notes (Signed)
[facility] Provider Note    Event Date/Time   First MD Initiated Contact with Patient 11/03/23 0012     (approximate)   History   Chest Pain   HPI  Regina Carpenter is a 48 year old female with history of HTN, DVT on Eliquis, RA, vasculitis presenting to the emergency department for evaluation of rib pain.  Patient reports she has had ongoing symptoms for several weeks.  Briefly improved after receiving antibiotics for suspected pneumonia from her primary care doctor, but now reports recurrent pain.  Described as a pain across her ribs worse when she is moving her arms around.  Does report tenderness of her chest.  Has tried multiple medications with ongoing symptoms including Tylenol, oxycodone, gabapentin, prednisone, lidocaine patch.  Reviewed multiple prior ER visits including in our system in the [facility] system.  Multiple negative x-rays, CT angiogram of the chest without evidence of PE on 12/28 and [facility] system.    Physical Exam   Triage Vital Signs: ED Triage Vitals  Encounter Vitals Group     BP 11/02/23 2119 (!) 140/100     Systolic BP Percentile --      Diastolic BP Percentile --      Pulse Rate 11/02/23 2119 94     Resp 11/02/23 2119 18     Temp 11/02/23 2119 98.7 F (37.1 C)     Temp Source 11/02/23 2119 Oral     SpO2 11/02/23 2119 98 %     Weight 11/02/23 2116 260 lb (117.9 kg)     Height 11/02/23 2116 5\' 5"  (1.651 m)     Head Circumference --      Peak Flow --      Pain Score 11/02/23 2116 10     Pain Loc --      Pain Education --      Exclude from Growth Chart --     Most recent vital signs: Vitals:   11/02/23 2119 11/03/23 0022  BP: (!) 140/100 120/84  Pulse: 94 88  Resp: 18 18  Temp: 98.7 F (37.1 C)   SpO2: 98% 100%     General: Awake, interactive  CV:  Regular rate, good peripheral perfusion.  Resp:  Unlabored respirations, lungs good auscultation Chest wall: Reproducible tenderness to palpation over the bilateral  anterior and lateral chest wall, worsened with movement of patient's torso and upper extremities Abd:  Nondistended, soft, nontender to palpation Neuro:  Symmetric facial movement, fluid speech   ED Results / Procedures / Treatments   Labs (all labs ordered are listed, but only abnormal results are displayed) Labs Reviewed  BASIC METABOLIC PANEL - Abnormal; Notable for the following components:      Result Value   CO2 20 (*)    All other components within normal limits  RESP PANEL BY RT-PCR (RSV, FLU A&B, COVID)  [id number]  CBC WITH DIFFERENTIAL/PLATELET     EKG EKG independently reviewed interpreted by myself (ER attending) demonstrates:  EKG demonstrates normal sinus rhythm at a rate of 83, PR 176, QRS 88, QTc 437, no acute ST changes  RADIOLOGY Imaging independently reviewed and interpreted by myself demonstrates:  CXR without focal consolidation, specifically no appreciable pneumonia  PROCEDURES:  Critical Care performed: No  Procedures   MEDICATIONS ORDERED IN ED: Medications - No data to display   IMPRESSION / MDM / ASSESSMENT AND PLAN / ED COURSE  I reviewed the triage vital signs and the nursing notes.  Differential diagnosis includes, but is  not limited to, musculoskeletal pain, pleurisy in the setting of recent respiratory illness, pneumonia, pneumothorax, low suspicion PE given description of pain, reassuring CTA chest at outside hospital with similar symptoms  Patient's presentation is most consistent with acute presentation with potential threat to life or bodily function.  48 year old female presenting to the emergency department for evaluation of rib pain.  Vital stable on presentation.  Labs in triage reassuring.  X-Chantell Kunkler without recurrent pneumonia.  Clinical history seems most consistent with musculoskeletal chest pain, but will obtain EKG to further evaluate.  I discussed multiple treatment options with the patient.  She reports that Toradol, other NSAIDs,  Tylenol, gabapentin, prednisone, oxycodone, lidocaine patches, Flexeril have all been ineffective for her.  Does have history of chronic pain.  I did discuss trialing a different muscle relaxer to see if this is more effective and patient is agreeable with this plan.  Strict return precautions provided.  Patient discharged stable condition.      FINAL CLINICAL IMPRESSION(S) / ED DIAGNOSES   Final diagnoses:  Chest wall pain     Rx / DC Orders   ED Discharge Orders          Ordered    methocarbamol (ROBAXIN) 500 MG tablet  Every 6 hours PRN        11/03/23 0220             Note:  This document was prepared using Dragon voice recognition software and may include unintentional dictation errors.   Trinna Post, MD 11/03/23 [phone]

## 2023-11-09 ENCOUNTER — Other Ambulatory Visit: Payer: Self-pay

## 2023-11-09 ENCOUNTER — Emergency Department: Payer: Medicaid Other

## 2023-11-09 DIAGNOSIS — I10 Essential (primary) hypertension: Secondary | ICD-10-CM | POA: Insufficient documentation

## 2023-11-09 DIAGNOSIS — E876 Hypokalemia: Secondary | ICD-10-CM | POA: Insufficient documentation

## 2023-11-09 DIAGNOSIS — R0789 Other chest pain: Secondary | ICD-10-CM | POA: Diagnosis not present

## 2023-11-09 DIAGNOSIS — R112 Nausea with vomiting, unspecified: Secondary | ICD-10-CM | POA: Diagnosis not present

## 2023-11-09 DIAGNOSIS — Z7901 Long term (current) use of anticoagulants: Secondary | ICD-10-CM | POA: Insufficient documentation

## 2023-11-09 DIAGNOSIS — Z79899 Other long term (current) drug therapy: Secondary | ICD-10-CM | POA: Diagnosis not present

## 2023-11-09 DIAGNOSIS — Z20822 Contact with and (suspected) exposure to covid-19: Secondary | ICD-10-CM | POA: Diagnosis not present

## 2023-11-09 DIAGNOSIS — F17200 Nicotine dependence, unspecified, uncomplicated: Secondary | ICD-10-CM | POA: Insufficient documentation

## 2023-11-09 DIAGNOSIS — R197 Diarrhea, unspecified: Secondary | ICD-10-CM | POA: Diagnosis not present

## 2023-11-09 DIAGNOSIS — R079 Chest pain, unspecified: Secondary | ICD-10-CM | POA: Diagnosis present

## 2023-11-09 LAB — BASIC METABOLIC PANEL
Anion gap: 11 (ref 5–15)
BUN: 15 mg/dL (ref 6–20)
CO2: 22 mmol/L (ref 22–32)
Calcium: 9.2 mg/dL (ref 8.9–10.3)
Chloride: 106 mmol/L (ref 98–111)
Creatinine, Ser: 0.88 mg/dL (ref 0.44–1.00)
GFR, Estimated: >60 mL/min (ref >60)
Glucose, Bld: 83 mg/dL (ref 70–99)
Potassium: 3.2 mmol/L — ABNORMAL LOW (ref 3.5–5.1)
Sodium: 139 mmol/L (ref 135–145)

## 2023-11-09 LAB — TROPONIN I (HIGH SENSITIVITY)
Troponin I (High Sensitivity): 4 ng/L (ref <18)
Troponin I (High Sensitivity): 6 ng/L (ref <18)

## 2023-11-09 LAB — RESP PANEL BY RT-PCR (RSV, FLU A&B, COVID)  RVPGX2
Influenza A by PCR: NEGATIVE
Influenza B by PCR: NEGATIVE
Resp Syncytial Virus by PCR: NEGATIVE
SARS Coronavirus 2 by RT PCR: NEGATIVE

## 2023-11-09 LAB — CBC
HCT: 42.2 % (ref 36.0–46.0)
Hemoglobin: 13.6 g/dL (ref 12.0–15.0)
MCH: 31.5 pg (ref 26.0–34.0)
MCHC: 32.2 g/dL (ref 30.0–36.0)
MCV: 97.7 fL (ref 80.0–100.0)
Platelets: 247 10*3/uL (ref 150–400)
RBC: 4.32 MIL/uL (ref 3.87–5.11)
RDW: 15.1 % (ref 11.5–15.5)
WBC: 9.8 10*3/uL (ref 4.0–10.5)
nRBC: 0 % (ref 0.0–0.2)

## 2023-11-09 NOTE — ED Triage Notes (Signed)
 Pt reports she has generalized body aches that has been xfew weeks. Pt reports chest pain that began today, pt states she also feels sob and has n/v/d.

## 2023-11-10 ENCOUNTER — Emergency Department
Admission: EM | Admit: 2023-11-10 | Discharge: 2023-11-10 | Disposition: A | Discharge status: Home / Self Care | Payer: Medicaid Other | Attending: Emergency Medicine | Admitting: Emergency Medicine

## 2023-11-10 DIAGNOSIS — E876 Hypokalemia: Secondary | ICD-10-CM

## 2023-11-10 DIAGNOSIS — R112 Nausea with vomiting, unspecified: Secondary | ICD-10-CM

## 2023-11-10 DIAGNOSIS — R079 Chest pain, unspecified: Secondary | ICD-10-CM

## 2023-11-10 LAB — POC URINE PREG, ED: Preg Test, Ur: NEGATIVE

## 2023-11-10 LAB — HEPATIC FUNCTION PANEL
ALT: 30 U/L (ref 0–44)
AST: 28 U/L (ref 15–41)
Albumin: 4 g/dL (ref 3.5–5.0)
Alkaline Phosphatase: 52 U/L (ref 38–126)
Bilirubin, Direct: 0.1 mg/dL (ref 0.0–0.2)
Indirect Bilirubin: 0.9 mg/dL (ref 0.3–0.9)
Total Bilirubin: 1 mg/dL (ref 0.0–1.2)
Total Protein: 7.9 g/dL (ref 6.5–8.1)

## 2023-11-10 LAB — LIPASE, BLOOD: Lipase: 24 U/L (ref 11–51)

## 2023-11-10 MED ORDER — OXYCODONE-ACETAMINOPHEN 5-325 MG PO TABS: 1.0000 | ORAL_TABLET | ORAL | 0 refills | Status: DC | PRN

## 2023-11-10 MED ORDER — METHYLPREDNISOLONE SODIUM SUCC 125 MG IJ SOLR
125.0000 mg | Freq: Once | INTRAMUSCULAR | Status: AC
  Administered 2023-11-10: 125 mg via INTRAVENOUS
  Filled 2023-11-10: qty 2

## 2023-11-10 MED ORDER — ONDANSETRON 4 MG PO TBDP
4.0000 mg | ORAL_TABLET | Freq: Once | ORAL | Status: AC
  Administered 2023-11-10: 4 mg via ORAL
  Filled 2023-11-10: qty 1

## 2023-11-10 MED ORDER — PREDNISONE 20 MG PO TABS: 20.0000 mg | ORAL_TABLET | Freq: Every day | ORAL | 0 refills | Status: AC

## 2023-11-10 MED ORDER — FAMOTIDINE IN NACL 20-0.9 MG/50ML-% IV SOLN
20.0000 mg | Freq: Once | INTRAVENOUS | Status: AC
  Administered 2023-11-10: 20 mg via INTRAVENOUS
  Filled 2023-11-10: qty 50

## 2023-11-10 MED ORDER — POTASSIUM CHLORIDE CRYS ER 20 MEQ PO TBCR
40.0000 meq | EXTENDED_RELEASE_TABLET | Freq: Once | ORAL | Status: AC
  Administered 2023-11-10: 40 meq via ORAL
  Filled 2023-11-10: qty 2

## 2023-11-10 MED ORDER — MORPHINE SULFATE (PF) 4 MG/ML IV SOLN
4.0000 mg | Freq: Once | INTRAVENOUS | Status: AC
  Administered 2023-11-10: 4 mg via INTRAVENOUS
  Filled 2023-11-10: qty 1

## 2023-11-10 MED ORDER — ONDANSETRON 4 MG PO TBDP: 4.0000 mg | ORAL_TABLET | Freq: Three times a day (TID) | ORAL | 0 refills | Status: DC | PRN

## 2023-11-10 MED ORDER — SODIUM CHLORIDE 0.9 % IV BOLUS
1000.0000 mL | Freq: Once | INTRAVENOUS | Status: AC
  Administered 2023-11-10: 1000 mL via INTRAVENOUS

## 2023-11-10 NOTE — Discharge Instructions (Addendum)
 Restart Prednisone  20 mg daily.  You may take medicines as needed for pain and nausea.  Clear liquids x 12 hours, then bland diet x 3 days, then slowly advance diet as tolerated.  Return to the ER for worsening symptoms, persistent vomiting, difficulty breathing or other concerns.

## 2023-11-10 NOTE — ED Notes (Signed)
 Called [facility]CMD and added pt to monitoring board

## 2023-11-10 NOTE — ED Provider Notes (Signed)
 [facility] Provider Note    Event Date/Time   First MD Initiated Contact with Patient 11/10/23 [phone]     (approximate)   History   Chest Pain   HPI  Regina Carpenter is a 48 y.o. female who presents to the ED from home with a several week history of generalized bodyaches, chest pain which began yesterday, shortness of breath, nausea, dry heaving and diarrhea.  History of CVD, ran out of her daily maintenance Prednisone  20mg  2 weeks ago and cannot get it refilled for another 2 weeks.  Denies fever/chills, abdominal pain, vomiting or dysuria.  Denies recent trauma, travel or hormone use.     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   Essential hypertension 05/17/2020   Smoking 05/17/2020   Sepsis (HCC) 05/16/2020   Hyponatremia 11/25/2016   Hypokalemia 11/25/2016   Leukocytosis 11/25/2016   Generalized weakness 11/25/2016   Generalized pain 11/25/2016   Conjunctivitis 11/25/2016   Scleritis and episcleritis of right eye    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   Abdominal pain 11/14/2016   Lymphadenopathy, mesenteric    Arthralgia of right knee 04/02/2015   Cellulitis of left leg 03/21/2015   Right hand pain 03/21/2015   Tobacco use 03/21/2015     Past Surgical History   Past Surgical History:  Procedure Laterality Date   denies     LOWER EXTREMITY ANGIOGRAPHY Bilateral 05/19/2020   Procedure: Lower Extremity Angiography;  Surgeon: Marea Selinda RAMAN, MD;  Location: [facility] IN[facility];   Service: Cardiovascular;  Laterality: Bilateral;   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 Authorizing Provider  ondansetron  (ZOFRAN -ODT) 4 MG disintegrating tablet Take 1 tablet (4 mg total) by mouth every 8 (eight) hours as needed for nausea or vomiting. 11/10/23  Yes Petula Rotolo J, MD  oxyCODONE -acetaminophen  (PERCOCET/ROXICET) 5-325 MG tablet Take 1 tablet by mouth every 4 (four) hours as needed for severe pain (pain score 7-10). 11/10/23  Yes Robinette Vermell PARAS, MD  predniSONE  (DELTASONE ) 20 MG tablet Take 1 tablet (20 mg total) by mouth daily with breakfast for 14 days. 3 tablets daily x 4 days 11/10/23 11/24/23 Yes Samone Guhl J, MD  albuterol  (VENTOLIN  HFA) 108 (90 Base) MCG/ACT inhaler Inhale 2 puffs into the lungs every 6 (six) hours as needed for wheezing or shortness of breath. 12/17/21   Jacolyn Pae, MD  amLODipine  (NORVASC ) 5 MG tablet Take 1 tablet (5 mg total) by mouth daily. 07/25/20   Tobie Yetta HERO, MD  amLODipine  (NORVASC ) 5 MG tablet Take by mouth.    [provider]  collagenase  (SANTYL ) ointment Apply topically daily. Patient not taking: Reported on 03/30/2021 07/25/20   Tobie Yetta HERO, MD  cyclophosphamide  (CYTOXAN ) 25 MG capsule  Take by mouth. Patient not taking: Reported on 03/30/2021 11/04/20   [provider]  cyclophosphamide  (CYTOXAN ) 50 MG capsule Take 2 capsules (100 mg total) by mouth daily. Take with food to minimize GI upset. Take early in the day and maintain hydration. 07/24/20   Tobie Yetta HERO, MD  dapsone  100 MG tablet Take 100 mg by mouth daily. 03/25/21   [provider]  ELIQUIS  5 MG TABS tablet Take 5 mg by mouth 2 (two) times daily. 03/25/21   [provider]  famotidine  (PEPCID ) 20 MG tablet Take  1 tablet (20 mg total) by mouth daily. Patient not taking: Reported on 03/30/2021 07/25/20   Tobie Yetta HERO, MD  famotidine  (PEPCID ) 20 MG tablet Take by mouth. Patient not taking: Reported on 03/30/2021    [provider]  gabapentin  (NEURONTIN ) 600 MG tablet Take 0.5 tablets (300 mg total) by mouth 3 (three) times daily. Patient not taking: Reported on 03/30/2021 07/24/20   Tobie Yetta HERO, MD  guaiFENesin  200 MG tablet Take 2 tablets (400 mg total) by mouth every 6 (six) hours as needed for cough or to loosen phlegm. 12/17/21   Jacolyn Pae, MD  ketorolac  (ACULAR ) 0.5 % ophthalmic solution Place 1 drop into the left eye 4 (four) times daily. Patient not taking: Reported on 03/30/2021 08/31/20   Saunders Shona CROME, PA-C  methocarbamol  (ROBAXIN ) 500 MG tablet Take 1 tablet (500 mg total) by mouth every 6 (six) hours as needed for up to 7 days for muscle spasms. 11/03/23 11/10/23  Levander Slate, MD  nicotine  (NICODERM CQ  - DOSED IN MG/24 HOURS) 14 mg/24hr patch Place 1 patch (14 mg total) onto the skin daily. 07/25/20   Tobie Yetta HERO, MD  nicotine  (NICODERM CQ  - DOSED IN MG/24 HOURS) 14 mg/24hr patch Place onto the skin.    [provider]  Oxycodone  HCl 10 MG TABS Take by mouth. 08/25/20   [provider]  saccharomyces boulardii (FLORASTOR) 250 MG capsule Take 1 capsule (250 mg total) by mouth 2 (two) times daily. 07/25/20   Tobie Yetta HERO, MD     Allergies  Ibuprofen, Sulfa  antibiotics, and Sulfasalazine   Family History   Family History  Problem Relation Age of Onset   Leukemia Maternal Grandfather    Alcohol abuse Father      Physical Exam  Triage Vital Signs: ED Triage Vitals  Encounter Vitals Group     BP 11/09/23 1932 123/85     Systolic BP Percentile --      Diastolic BP Percentile --      Pulse Rate 11/09/23 1932 (!) 106     Resp 11/09/23 1932 20     Temp 11/09/23 1932 98.7 F (37.1 C)     Temp Source 11/09/23 1932 Oral     SpO2 11/09/23  1932 99 %     Weight 11/09/23 1926 260 lb (117.9 kg)     Height 11/09/23 1926 5' 5 (1.651 m)     Head Circumference --      Peak Flow --      Pain Score 11/09/23 1926 10     Pain Loc --      Pain Education --      Exclude from Growth Chart --     Updated Vital Signs: BP 115/72   Pulse 99   Temp 98 F (36.7 C) (Oral)   Resp 18   Ht 5' 5 (1.651 m)   Wt 117.9 kg   SpO2 99%  BMI 43.27 kg/m    General: Awake, mild distress.  Tearful, grimaces with pain on movement. CV:  RRR.  Good peripheral perfusion.  Resp:  Normal effort.  CTAB.  Anterior chest tender to palpation. Abd:  Obese.  Nontender.  No distention.  Other:  No truncal vesicles.   ED Results / Procedures / Treatments  Labs (all labs ordered are listed, but only abnormal results are displayed) Labs Reviewed  BASIC METABOLIC PANEL - Abnormal; Notable for the following components:      Result Value   Potassium 3.2 (*)    All other components within normal limits  POC URINE PREG, ED - Normal  RESP PANEL BY RT-PCR (RSV, FLU A&B, COVID)  RVPGX2  CBC  HEPATIC FUNCTION PANEL  LIPASE, BLOOD  TROPONIN I (HIGH SENSITIVITY)  TROPONIN I (HIGH SENSITIVITY)     EKG  ED ECG REPORT I, Ina Poupard J, the attending physician, personally viewed and interpreted this ECG.   Date: 11/10/2023  EKG Time: 1930  Rate: 105  Rhythm: sinus tachycardia  Axis: Normal  Intervals:none  ST&T Change: Nonspecific    RADIOLOGY I have independently visualized and interpreted patient's imaging study as well as noted the radiology interpretation:  Chest x-ray: No acute cardiopulmonary process  Official radiology report(s): DG Chest 2 View Result Date: 11/09/2023 CLINICAL DATA:  Chest pain. Generalized body aches for a few weeks. Chest pain beginning today. Shortness of breath. Nausea, vomiting, diarrhea. EXAM: CHEST - 2 VIEW COMPARISON:  11/02/2023 FINDINGS: Heart size and pulmonary vascularity are normal for technique. Lungs are  clear. No pleural effusion or pneumothorax. Mediastinal contours appear intact. No developing consolidation. IMPRESSION: No active cardiopulmonary disease. Electronically Signed   By: Elsie Gravely M.D.   On: 11/09/2023 20:05     PROCEDURES:  Critical Care performed: No  .1-3 Lead EKG Interpretation  Performed by: Robinette Vermell PARAS, MD Authorized by: Robinette Vermell PARAS, MD     Interpretation: normal     ECG rate:  80   ECG rate assessment: normal     Rhythm: sinus rhythm     Ectopy: none     Conduction: normal   Comments:     Patient placed on cardiac monitor to evaluate for arrhythmias    MEDICATIONS ORDERED IN ED: Medications  ondansetron  (ZOFRAN -ODT) disintegrating tablet 4 mg (4 mg Oral Given 11/10/23 0325)  potassium chloride  SA (KLOR-CON  M) CR tablet 40 mEq (40 mEq Oral Given 11/10/23 0325)  methylPREDNISolone  sodium succinate (SOLU-MEDROL ) 125 mg/2 mL injection 125 mg (125 mg Intravenous Given 11/10/23 0339)  sodium chloride  0.9 % bolus 1,000 mL (0 mLs Intravenous Stopped 11/10/23 0534)  morphine  (PF) 4 MG/ML injection 4 mg (4 mg Intravenous Given 11/10/23 0339)  famotidine  (PEPCID ) IVPB 20 mg premix (0 mg Intravenous Stopped 11/10/23 0410)     IMPRESSION / MDM / ASSESSMENT AND PLAN / ED COURSE  I reviewed the triage vital signs and the nursing notes.                             48 year old female presenting with a several week history of generalized bodyaches, nausea, dry heaving, diarrhea, chest pain x 1 day.  Off maintenance prednisone  x 2 weeks. Differential diagnosis includes, but is not limited to, ACS, aortic dissection, pulmonary embolism, cardiac tamponade, pneumothorax, pneumonia, pericarditis, myocarditis, GI-related causes including esophagitis/gastritis, and musculoskeletal chest wall pain.   I personally reviewed patient's records and note and ED visit from  11/03/2023 for chest wall pain, ED visit from 10/01/2023 for chest pain, ED visit on 09/25/2023 for chest  pain.  Patient's presentation is most consistent with acute complicated illness requiring diagnostic workup.  The patient is on the cardiac monitor to evaluate for evidence of arrhythmia and/or significant heart rate changes.  Laboratory results including 2 sets of troponin unremarkable other than mild hypokalemia with potassium 3.2.  Chest x-ray, respiratory panel are negative.  Low suspicion for vascular catastrophe or PE.  Administer IV fluids, morphine /Pepcid  for pain, Zofran  for nausea, replete potassium orally, restart steroid.  Will reassess.  Clinical Course as of 11/10/23 0553  Thu Nov 10, 2023  0518 Patient feeling significantly better.  Will refill her prednisone  20 mg daily for the next 2 weeks until she can get her next prescription, as needed pain medicine and patient will follow-up closely with her PCP.  Strict return precautions given.  Patient verbalizes understanding and agrees with plan of care. [JS]    Clinical Course User Index [JS] Robinette Vermell PARAS, MD     FINAL CLINICAL IMPRESSION(S) / ED DIAGNOSES   Final diagnoses:  Chest pain, unspecified type  Nausea vomiting and diarrhea  Hypokalemia     Rx / DC Orders   ED Discharge Orders          Ordered    predniSONE  (DELTASONE ) 20 MG tablet  Daily with breakfast        11/10/23 0521    oxyCODONE -acetaminophen  (PERCOCET/ROXICET) 5-325 MG tablet  Every 4 hours PRN        11/10/23 0521    ondansetron  (ZOFRAN -ODT) 4 MG disintegrating tablet  Every 8 hours PRN        11/10/23 0522             Note:  This document was prepared using Dragon voice recognition software and may include unintentional dictation errors.   Khamari Sheehan J, MD 11/10/23 [phone]

## 2023-11-10 NOTE — ED Notes (Signed)
Called pt x 2.  No answer 

## 2024-01-06 ENCOUNTER — Encounter: Payer: Self-pay | Admitting: Emergency Medicine

## 2024-01-06 ENCOUNTER — Other Ambulatory Visit: Payer: Self-pay

## 2024-01-06 ENCOUNTER — Emergency Department

## 2024-01-06 ENCOUNTER — Emergency Department
Admission: EM | Admit: 2024-01-06 | Discharge: 2024-01-06 | Disposition: A | Discharge status: Home / Self Care | Attending: Emergency Medicine | Admitting: Emergency Medicine

## 2024-01-06 DIAGNOSIS — R600 Localized edema: Secondary | ICD-10-CM | POA: Diagnosis not present

## 2024-01-06 DIAGNOSIS — Z7901 Long term (current) use of anticoagulants: Secondary | ICD-10-CM | POA: Diagnosis not present

## 2024-01-06 DIAGNOSIS — M7989 Other specified soft tissue disorders: Secondary | ICD-10-CM | POA: Diagnosis present

## 2024-01-06 LAB — CBC WITH DIFFERENTIAL/PLATELET
Abs Immature Granulocytes: 0.12 10*3/uL — ABNORMAL HIGH (ref 0.00–0.07)
Basophils Absolute: 0 10*3/uL (ref 0.0–0.1)
Basophils Relative: 0 %
Eosinophils Absolute: 0.1 10*3/uL (ref 0.0–0.5)
Eosinophils Relative: 1 %
HCT: 40.3 % (ref 36.0–46.0)
Hemoglobin: 13.1 g/dL (ref 12.0–15.0)
Immature Granulocytes: 1 %
Lymphocytes Relative: 25 %
Lymphs Abs: 2.4 10*3/uL (ref 0.7–4.0)
MCH: 31.3 pg (ref 26.0–34.0)
MCHC: 32.5 g/dL (ref 30.0–36.0)
MCV: 96.2 fL (ref 80.0–100.0)
Monocytes Absolute: 0.7 10*3/uL (ref 0.1–1.0)
Monocytes Relative: 7 %
Neutro Abs: 6.4 10*3/uL (ref 1.7–7.7)
Neutrophils Relative %: 66 %
Platelets: 217 10*3/uL (ref 150–400)
RBC: 4.19 MIL/uL (ref 3.87–5.11)
RDW: 15 % (ref 11.5–15.5)
WBC: 9.7 10*3/uL (ref 4.0–10.5)
nRBC: 0 % (ref 0.0–0.2)

## 2024-01-06 LAB — COMPREHENSIVE METABOLIC PANEL WITH GFR
ALT: 20 U/L (ref 0–44)
AST: 20 U/L (ref 15–41)
Albumin: 4 g/dL (ref 3.5–5.0)
Alkaline Phosphatase: 74 U/L (ref 38–126)
Anion gap: 8 (ref 5–15)
BUN: 15 mg/dL (ref 6–20)
CO2: 24 mmol/L (ref 22–32)
Calcium: 9.6 mg/dL (ref 8.9–10.3)
Chloride: 105 mmol/L (ref 98–111)
Creatinine, Ser: 0.76 mg/dL (ref 0.44–1.00)
GFR, Estimated: >60 mL/min (ref >60)
Glucose, Bld: 103 mg/dL — ABNORMAL HIGH (ref 70–99)
Potassium: 4.3 mmol/L (ref 3.5–5.1)
Sodium: 137 mmol/L (ref 135–145)
Total Bilirubin: 0.7 mg/dL (ref 0.0–1.2)
Total Protein: 7.8 g/dL (ref 6.5–8.1)

## 2024-01-06 LAB — BRAIN NATRIURETIC PEPTIDE: B Natriuretic Peptide: 15.5 pg/mL (ref 0.0–100.0)

## 2024-01-06 MED ORDER — APIXABAN 5 MG PO TABS
5.0000 mg | ORAL_TABLET | Freq: Once | ORAL | Status: AC
  Administered 2024-01-06: 5 mg via ORAL
  Filled 2024-01-06: qty 1

## 2024-01-06 MED ORDER — GABAPENTIN 300 MG PO CAPS
300.0000 mg | ORAL_CAPSULE | Freq: Once | ORAL | Status: AC
  Administered 2024-01-06: 300 mg via ORAL
  Filled 2024-01-06: qty 1

## 2024-01-06 NOTE — ED Notes (Signed)
 Called for triage, patient out in parking lot.

## 2024-01-06 NOTE — ED Provider Notes (Signed)
 [facility] Provider Note    Event Date/Time   First MD Initiated Contact with Patient 01/06/24 1741     (approximate)   History   Wound Check   HPI  Tieshia Rettinger is a 48 y.o. female with history of DVTs on Eliquis, history of bilateral sciatica, chronic leg pain, neuropathy, presenting with right lower extremity swelling.  Has been ongoing for several days.  States that she ran out of her Eliquis 1 week ago, is at the pharmacy now and she will pick it up tomorrow morning.  She denies any chest pain or shortness of breath, no incontinence, no new weakness or numbness.  States that she has been the needles in her legs.  States that the bilateral sciatica and back pain is old and unchanged.  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  Is concerned that the swelling might be due to DVT.  Also states that she has history of ulcers to her legs.  Concerned that new ones are forming.  No open wounds, no redness or purulent drainage to her legs.   On independent review, she was seen by her primary care doctor in mid March, has history of RA, vasculitis, PE, on Eliquis, is taking gabapentin for her lower extremity pain, was originally on oxycodone from rheumatology but this will continue because of breaking the pain contract.  Is using marijuana to assist.     Physical Exam   Triage Vital Signs: ED Triage Vitals  Encounter Vitals Group     BP 01/06/24 1351 (!) 121/94     Systolic BP Percentile --      Diastolic BP Percentile --      Pulse Rate 01/06/24 1351 89     Resp 01/06/24 1351 16     Temp 01/06/24 1351 98.3 F (36.8 C)     Temp Source 01/06/24 1351 Oral     SpO2 01/06/24 1351 97 %     Weight 01/06/24 1351 259 lb 14.8 oz (117.9 kg)     Height --      Head Circumference --      Peak Flow --      Pain Score 01/06/24 1350 8     Pain Loc --      Pain Education --      Exclude from Growth Chart --     Most recent vital signs: Vitals:    01/06/24 1351 01/06/24 1800  BP: (!) 121/94 128/84  Pulse: 89 74  Resp: 16 16  Temp: 98.3 F (36.8 C)   SpO2: 97% 100%     General: Awake, no distress.  CV:  Good peripheral perfusion.  Resp:  Normal effort.  No tachypnea or increased work of breathing Abd:  No distention.  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  No edema on the left.  Equal DP pulses bilaterally.  There is no erythema, no open wounds, no ulcers, no drainage to her bilateral lower extremities.  She has no bony tenderness to palpation to her bilateral lower extremities, no pain out of proportion.   ED Results / Procedures / Treatments   Labs (all labs ordered are listed, but only abnormal results are displayed) Labs Reviewed  CBC WITH DIFFERENTIAL/PLATELET - Abnormal; Notable for the following components:  Result Value   Abs Immature Granulocytes 0.12 (*)    All other components within normal limits  COMPREHENSIVE METABOLIC PANEL WITH GFR - Abnormal; Notable for the following components:   Glucose, Bld 103 (*)    All other components within normal limits  BRAIN NATRIURETIC PEPTIDE      RADIOLOGY DVT ultrasound my depend interpretation without obvious DVT.   PROCEDURES:  Critical Care performed: No  Procedures   MEDICATIONS ORDERED IN ED: Medications  gabapentin (NEURONTIN) capsule 300 mg (300 mg Oral Given 01/06/24 1823)  apixaban (ELIQUIS) tablet 5 mg (5 mg Oral Given 01/06/24 1823)     IMPRESSION / MDM / ASSESSMENT AND PLAN / ED COURSE  I reviewed the triage vital signs and the nursing notes.                              Differential diagnosis includes, but is not limited to, medication noncompliance, DVT, venous stasis, considered CHF the patient has no prior history of CHF, denies any chest pain or shortness of  breath.  Also considered PE but she is not hypoxic, not tachypneic, no chest pain or shortness of breath.  No open wounds to the chest ulceration, no erythema to suggest cellulitis.  She has equal strength and sensation bilaterally, able to feel bilateral DP pulses equally.  Labs as well as DVT ultrasound was obtained in triage.  Patient's presentation is most consistent with acute presentation with potential threat to life or bodily function.  Independent review of labs and imaging are below.  Will give her a dose of gabapentin here for the neuropathic pain.  Will also give her her evening dose of Eliquis since she states that she cannot pick her prescription up tonight but will pick it up tomorrow morning.  Otherwise considered but no indication for inpatient admission at this time, she is safe for outpatient management.  Instructed her to follow-up with a primary care doctor for further management of her symptoms.  Shared decision making done and she is agreeable with the plan.  Clinical Course as of 01/11/24 1215  Fri Jan 06, 2024  1754 US Venous Img Lower Unilateral Right IMPRESSION: No right lower extremity DVT.   [TT]  1815 Electrolyte severely deranged, LFTs are normal, no leukocytosis. [TT]    Clinical Course User Index [TT] Jodie Echevaria, Franchot Erichsen, MD     FINAL CLINICAL IMPRESSION(S) / ED DIAGNOSES   Final diagnoses:  Lower extremity edema     Rx / DC Orders   ED Discharge Orders     None        Note:  This document was prepared using Dragon voice recognition software and may include unintentional dictation errors.    Claybon Jabs, MD 01/11/24 [phone]

## 2024-01-06 NOTE — Discharge Instructions (Signed)
 Please pick up the prescription for Eliquis either today or tomorrow and take it as prescribed.  Please make sure to keep your legs elevated.

## 2024-01-06 NOTE — ED Triage Notes (Signed)
 C/O leg ulcer starting to right ankle x 2 days. Also right leg sweling and pain/burning sensation. Sent to ED from [facility] for evaluation.

## 2024-01-16 ENCOUNTER — Other Ambulatory Visit: Payer: Self-pay | Admitting: Internal Medicine

## 2024-01-16 DIAGNOSIS — Z1231 Encounter for screening mammogram for malignant neoplasm of breast: Secondary | ICD-10-CM

## 2024-01-18 ENCOUNTER — Emergency Department

## 2024-01-18 ENCOUNTER — Encounter: Payer: Self-pay | Admitting: Emergency Medicine

## 2024-01-18 ENCOUNTER — Emergency Department
Admission: EM | Admit: 2024-01-18 | Discharge: 2024-01-18 | Disposition: A | Discharge status: Home / Self Care | Attending: Emergency Medicine | Admitting: Emergency Medicine

## 2024-01-18 ENCOUNTER — Other Ambulatory Visit: Payer: Self-pay

## 2024-01-18 DIAGNOSIS — G8929 Other chronic pain: Secondary | ICD-10-CM | POA: Diagnosis not present

## 2024-01-18 DIAGNOSIS — M25562 Pain in left knee: Secondary | ICD-10-CM | POA: Insufficient documentation

## 2024-01-18 DIAGNOSIS — L97529 Non-pressure chronic ulcer of other part of left foot with unspecified severity: Secondary | ICD-10-CM | POA: Diagnosis not present

## 2024-01-18 DIAGNOSIS — Z7901 Long term (current) use of anticoagulants: Secondary | ICD-10-CM | POA: Insufficient documentation

## 2024-01-18 DIAGNOSIS — F17200 Nicotine dependence, unspecified, uncomplicated: Secondary | ICD-10-CM | POA: Insufficient documentation

## 2024-01-18 DIAGNOSIS — M25511 Pain in right shoulder: Secondary | ICD-10-CM | POA: Insufficient documentation

## 2024-01-18 DIAGNOSIS — R079 Chest pain, unspecified: Secondary | ICD-10-CM | POA: Diagnosis not present

## 2024-01-18 LAB — TROPONIN I (HIGH SENSITIVITY): Troponin I (High Sensitivity): 4 ng/L (ref <18)

## 2024-01-18 LAB — CBC WITH DIFFERENTIAL/PLATELET
Abs Immature Granulocytes: 0.05 10*3/uL (ref 0.00–0.07)
Basophils Absolute: 0 10*3/uL (ref 0.0–0.1)
Basophils Relative: 0 %
Eosinophils Absolute: 0.2 10*3/uL (ref 0.0–0.5)
Eosinophils Relative: 1 %
HCT: 37.7 % (ref 36.0–46.0)
Hemoglobin: 12.3 g/dL (ref 12.0–15.0)
Immature Granulocytes: 0 %
Lymphocytes Relative: 41 %
Lymphs Abs: 5.2 10*3/uL — ABNORMAL HIGH (ref 0.7–4.0)
MCH: 31 pg (ref 26.0–34.0)
MCHC: 32.6 g/dL (ref 30.0–36.0)
MCV: 95 fL (ref 80.0–100.0)
Monocytes Absolute: 0.8 10*3/uL (ref 0.1–1.0)
Monocytes Relative: 7 %
Neutro Abs: 6.2 10*3/uL (ref 1.7–7.7)
Neutrophils Relative %: 51 %
Platelets: 259 10*3/uL (ref 150–400)
RBC: 3.97 MIL/uL (ref 3.87–5.11)
RDW: 14.5 % (ref 11.5–15.5)
Smear Review: NORMAL
WBC: 12.4 10*3/uL — ABNORMAL HIGH (ref 4.0–10.5)
nRBC: 0 % (ref 0.0–0.2)

## 2024-01-18 LAB — BASIC METABOLIC PANEL WITH GFR
Anion gap: 9 (ref 5–15)
BUN: 15 mg/dL (ref 6–20)
CO2: 23 mmol/L (ref 22–32)
Calcium: 8.9 mg/dL (ref 8.9–10.3)
Chloride: 107 mmol/L (ref 98–111)
Creatinine, Ser: 0.88 mg/dL (ref 0.44–1.00)
GFR, Estimated: >60 mL/min (ref >60)
Glucose, Bld: 80 mg/dL (ref 70–99)
Potassium: 3.5 mmol/L (ref 3.5–5.1)
Sodium: 139 mmol/L (ref 135–145)

## 2024-01-18 MED ORDER — CEPHALEXIN 500 MG PO CAPS
500.0000 mg | ORAL_CAPSULE | Freq: Once | ORAL | Status: AC
  Administered 2024-01-18: 500 mg via ORAL
  Filled 2024-01-18: qty 1

## 2024-01-18 MED ORDER — ACETAMINOPHEN 500 MG PO TABS
1000.0000 mg | ORAL_TABLET | Freq: Once | ORAL | Status: AC
  Administered 2024-01-18: 1000 mg via ORAL
  Filled 2024-01-18: qty 2

## 2024-01-18 MED ORDER — DEXAMETHASONE SODIUM PHOSPHATE 10 MG/ML IJ SOLN
10.0000 mg | Freq: Once | INTRAMUSCULAR | Status: AC
  Administered 2024-01-18: 10 mg via INTRAMUSCULAR
  Filled 2024-01-18: qty 1

## 2024-01-18 MED ORDER — OXYCODONE HCL 5 MG PO TABS: 5.0000 mg | ORAL_TABLET | Freq: Three times a day (TID) | ORAL | 0 refills | Status: DC | PRN

## 2024-01-18 MED ORDER — CEPHALEXIN 500 MG PO CAPS
500.0000 mg | ORAL_CAPSULE | Freq: Four times a day (QID) | ORAL | 0 refills | Status: AC
Start: 2024-01-18 — End: 2024-01-25

## 2024-01-18 MED ORDER — KETOROLAC TROMETHAMINE 30 MG/ML IJ SOLN
30.0000 mg | Freq: Once | INTRAMUSCULAR | Status: AC
  Administered 2024-01-18: 30 mg via INTRAMUSCULAR
  Filled 2024-01-18: qty 1

## 2024-01-18 MED ORDER — OXYCODONE HCL 5 MG PO TABS
5.0000 mg | ORAL_TABLET | Freq: Once | ORAL | Status: AC
  Administered 2024-01-18: 5 mg via ORAL
  Filled 2024-01-18: qty 1

## 2024-01-18 NOTE — ED Triage Notes (Signed)
 Patient ambulatory to triage with steady gait, without difficulty or distress noted; pt c/o left knee pain x 2 days with no known injury; denies swelling

## 2024-01-18 NOTE — ED Provider Notes (Signed)
 [facility] Provider Note    Event Date/Time   First MD Initiated Contact with Patient 01/18/24 [phone]     (approximate)   History   Knee Pain   HPI  Regina Carpenter is a 48 y.o. 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  Her primary complaint is that she is having left knee pain atraumatic over the last 3 days.  There is no swelling or warmth to the joint.  She is able to walk despite the pain.  She has no calf pain or muscle pain to the affected extremity.  She has been fully compliant with her Eliquis.  After discussing this primary complaint, she then also notes that she would like to be checked for a small ulceration on the lateral side of the right lower extremity over the last several days.  There is no redness or warmth to the area she denies any systemic infectious symptoms like fevers or chills and there has been no purulence.  After discussing this secondary complaint, she then tells me that she also has been having left-sided chest pain for several days that "feels like a pneumonia" but she denies any respiratory infectious symptoms like cough, fevers, chills, congestion or shortness of breath.  She states that the left-sided chest pain is worse with exertion.  After discussing this tertiary complaint, she also notes that she has had chronic right sided shoulder pain and is expecting an injection from her rheumatologist that typically helps.  There is no new injuries to this area.     External Medical Documents Reviewed: Annual physical exam from her primary doctor office visit in April 2025 document past medical history medications      Physical Exam   Triage Vital Signs: ED Triage Vitals  Encounter Vitals Group     BP 01/18/24 0246 (!)  151/99     Systolic BP Percentile --      Diastolic BP Percentile --      Pulse Rate 01/18/24 0246 88     Resp 01/18/24 0246 17     Temp 01/18/24 0246 98.2 F (36.8 C)     Temp Source 01/18/24 0246 Oral     SpO2 01/18/24 0246 100 %     Weight 01/18/24 0242 250 lb (113.4 kg)     Height 01/18/24 0242 5\' 5"  (1.651 m)     Head Circumference --      Peak Flow --      Pain Score 01/18/24 0242 8     Pain Loc --      Pain Education --      Exclude from Growth Chart --     Most recent vital signs: Vitals:   01/18/24 0246  BP: (!) 151/99  Pulse: 88  Resp: 17  Temp: 98.2 F (36.8 C)  SpO2: 100%    General: Awake, no distress.  CV:  Good peripheral perfusion.  Resp:  Normal effort.  Abd:  No distention.  Other:  There is no significant swelling or warmth to the affected left knee.  There is no calf tenderness.  There is no skin changes.  She is able to range.  The extremity feels warm and well-perfused.  There is a small pencil eraser sized ulceration to the lateral side of the right lower extremity with no cellulitic changes or purulence.  It is about skin deep.  That extremity feels warm and well-perfused.  She has no chest wall tenderness or skin lesions surrounding the left side of the chest where she is experiencing pain.  She has clear lungs to auscultation without focality or wheezing.  She is able to range at the right shoulder and there is no obvious warmth or swelling to the joint.  She is neurovascular intact to the affected extremity.     ED Results / Procedures / Treatments   Labs (all labs ordered are listed, but only abnormal results are displayed) Labs Reviewed  CBC WITH DIFFERENTIAL/PLATELET - Abnormal; Notable for the following components:      Result Value   WBC 12.4 (*)    All other components within normal limits  BASIC METABOLIC PANEL WITH GFR  TROPONIN I (HIGH SENSITIVITY)     I ordered and reviewed the above labs they are notable for mild  leukocytosis  EKG  ED ECG REPORT I, Buell Carmin, the attending physician, personally viewed and interpreted this ECG.   Date: 01/18/2024  EKG Time: 0318  Rate: 80  Rhythm: sinus  Axis: nl  Intervals:nl  ST&T Change: no stemi    RADIOLOGY I independently reviewed and interpreted knee x-ray and I see no obvious fracture or dislocation I also reviewed radiologist's formal read.   PROCEDURES:  Critical Care performed: No  Procedures   MEDICATIONS ORDERED IN ED: Medications  acetaminophen (TYLENOL) tablet 1,000 mg (1,000 mg Oral Given 01/18/24 0257)  ketorolac (TORADOL) 30 MG/ML injection 30 mg (30 mg Intramuscular Given 01/18/24 0258)  dexamethasone (DECADRON) injection 10 mg (10 mg Intramuscular Given 01/18/24 0258)  cephALEXin (KEFLEX) capsule 500 mg (500 mg Oral Given 01/18/24 0333)  oxyCODONE (Oxy IR/ROXICODONE) immediate release tablet 5 mg (5 mg Oral Given 01/18/24 0333)     IMPRESSION / MDM / ASSESSMENT AND PLAN / ED COURSE  I reviewed the triage vital signs and the nursing notes.                                Patient's presentation is most consistent with acute presentation with potential threat to life or bodily function.  Differential diagnosis includes, but is not limited to, ACS, PE, dissection, bacterial pneumonia, musculoskeletal pain, arthritis flare, septic joint, fracture dislocation, cellulitis, abscess, ulceration, ischemic limb, DVT   The patient is on the cardiac monitor to evaluate for evidence of arrhythmia and/or significant heart rate changes.  MDM:    As for her joint pains I think this is arthritis related and I will give nonsteroidal anti-inflammatory and steroid injection and a short course of oxycodone however follow-up with rheumatology.  Will get an x-ray of the affected knee to rule out fracture dislocation, fortunately it does not appear like a septic joint.  As for her chest pain I doubt PE given her full compliance with Eliquis.  I  doubt infection given no infectious symptoms.  To check for ACS with EKG which fortunately is nonischemic and can check a single troponin given the duration of her symptoms and atypical for ACS I think single troponin sufficient to rule out ACS if normal.  For her small skin ulceration to her right lower extremity she does have a history of a very poor healing ulceration in the past, no obvious infectious changes but if early infection I think prudent to start on Keflex.  She has lost touch with her [facility] podiatrist  and I asked her to reach out for a follow-up appointment and also gave her a [facility] podiatrist contact information should they not be able to be in touch with [facility].  There is no obvious infectious changes and it does not appear to be an ischemic limb at this time.  She is stable and well-appearing despite these several complaints and thus after our evaluation the emergency department should we not find any emergency conditions the plan will be for outpatient follow-up as above.        FINAL CLINICAL IMPRESSION(S) / ED DIAGNOSES   Final diagnoses:  Acute pain of left knee  Chronic right shoulder pain  Nonspecific chest pain  Ulcer of left foot, unspecified ulcer stage (HCC)     Rx / DC Orders   ED Discharge Orders          Ordered    oxyCODONE (ROXICODONE) 5 MG immediate release tablet  Every 8 hours PRN        01/18/24 0323    cephALEXin (KEFLEX) 500 MG capsule  4 times daily        01/18/24 1610             Note:  This document was prepared using Dragon voice recognition software and may include unintentional dictation errors.    Buell Carmin, MD 01/18/24 [phone]

## 2024-01-18 NOTE — Discharge Instructions (Signed)
 Take oxycodone for severe pain only. Follow-up with your podiatrist from [facility] or you can call Dr. Althea Atkinson who is a podiatrist for [facility] to assess your foot wound on the right side.  Take Keflex to treat early infection as prescribed.  As for your chest pain we did not find any medical emergencies like heart attack or lung infection to explain your pain, fortunately.  Follow-up with your primary doctor for any further testing as needed for this chest pain.  Follow-up with your rheumatologist for ongoing joint pain including your knee and shoulder and ask about joint injections that have worked in the past.  Thank you for choosing us  for your health care today!  Please see your primary doctor this week for a follow up appointment.   If you have any new, worsening, or unexpected symptoms call your doctor right away or come back to the emergency department for reevaluation.  It was my pleasure to care for you today.   Arron Large Margery Sheets, MD

## 2024-01-20 ENCOUNTER — Other Ambulatory Visit: Payer: Self-pay

## 2024-01-20 ENCOUNTER — Emergency Department
Admission: EM | Admit: 2024-01-20 | Discharge: 2024-01-20 | Disposition: A | Discharge status: Home / Self Care | Attending: Emergency Medicine | Admitting: Emergency Medicine

## 2024-01-20 ENCOUNTER — Encounter: Payer: Self-pay | Admitting: Emergency Medicine

## 2024-01-20 ENCOUNTER — Emergency Department

## 2024-01-20 DIAGNOSIS — R0789 Other chest pain: Secondary | ICD-10-CM | POA: Insufficient documentation

## 2024-01-20 DIAGNOSIS — R079 Chest pain, unspecified: Secondary | ICD-10-CM | POA: Diagnosis present

## 2024-01-20 DIAGNOSIS — Z720 Tobacco use: Secondary | ICD-10-CM | POA: Insufficient documentation

## 2024-01-20 DIAGNOSIS — Z7901 Long term (current) use of anticoagulants: Secondary | ICD-10-CM | POA: Insufficient documentation

## 2024-01-20 LAB — COMPREHENSIVE METABOLIC PANEL WITH GFR
ALT: 13 U/L (ref 0–44)
AST: 17 U/L (ref 15–41)
Albumin: 3.8 g/dL (ref 3.5–5.0)
Alkaline Phosphatase: 73 U/L (ref 38–126)
Anion gap: 13 (ref 5–15)
BUN: 17 mg/dL (ref 6–20)
CO2: 24 mmol/L (ref 22–32)
Calcium: 9.3 mg/dL (ref 8.9–10.3)
Chloride: 102 mmol/L (ref 98–111)
Creatinine, Ser: 0.78 mg/dL (ref 0.44–1.00)
GFR, Estimated: >60 mL/min (ref >60)
Glucose, Bld: 110 mg/dL — ABNORMAL HIGH (ref 70–99)
Potassium: 4.2 mmol/L (ref 3.5–5.1)
Sodium: 139 mmol/L (ref 135–145)
Total Bilirubin: 0.7 mg/dL (ref 0.0–1.2)
Total Protein: 7.6 g/dL (ref 6.5–8.1)

## 2024-01-20 LAB — CBC WITH DIFFERENTIAL/PLATELET
Abs Immature Granulocytes: 0.07 10*3/uL (ref 0.00–0.07)
Basophils Absolute: 0 10*3/uL (ref 0.0–0.1)
Basophils Relative: 0 %
Eosinophils Absolute: 0 10*3/uL (ref 0.0–0.5)
Eosinophils Relative: 0 %
HCT: 38.9 % (ref 36.0–46.0)
Hemoglobin: 12.7 g/dL (ref 12.0–15.0)
Immature Granulocytes: 1 %
Lymphocytes Relative: 24 %
Lymphs Abs: 2.7 10*3/uL (ref 0.7–4.0)
MCH: 31.3 pg (ref 26.0–34.0)
MCHC: 32.6 g/dL (ref 30.0–36.0)
MCV: 95.8 fL (ref 80.0–100.0)
Monocytes Absolute: 0.8 10*3/uL (ref 0.1–1.0)
Monocytes Relative: 7 %
Neutro Abs: 7.5 10*3/uL (ref 1.7–7.7)
Neutrophils Relative %: 68 %
Platelets: 257 10*3/uL (ref 150–400)
RBC: 4.06 MIL/uL (ref 3.87–5.11)
RDW: 14.6 % (ref 11.5–15.5)
WBC: 11.1 10*3/uL — ABNORMAL HIGH (ref 4.0–10.5)
nRBC: 0 % (ref 0.0–0.2)

## 2024-01-20 LAB — TROPONIN I (HIGH SENSITIVITY): Troponin I (High Sensitivity): <2 ng/L (ref <18)

## 2024-01-20 MED ORDER — LIDOCAINE 5 % EX PTCH
1.0000 | MEDICATED_PATCH | Freq: Once | CUTANEOUS | Status: DC
  Administered 2024-01-20: 1 via TRANSDERMAL
  Filled 2024-01-20: qty 1

## 2024-01-20 MED ORDER — OXYCODONE-ACETAMINOPHEN 5-325 MG PO TABS
1.0000 | ORAL_TABLET | Freq: Once | ORAL | Status: AC
  Administered 2024-01-20: 1 via ORAL
  Filled 2024-01-20: qty 1

## 2024-01-20 MED ORDER — METHOCARBAMOL 500 MG PO TABS
1000.0000 mg | ORAL_TABLET | Freq: Once | ORAL | Status: AC
  Administered 2024-01-20: 1000 mg via ORAL
  Filled 2024-01-20: qty 2

## 2024-01-20 MED ORDER — METHOCARBAMOL 500 MG PO TABS
500.0000 mg | ORAL_TABLET | Freq: Three times a day (TID) | ORAL | 0 refills | Status: DC | PRN
Start: 2024-01-20 — End: 2024-03-14

## 2024-01-20 MED ORDER — LIDOCAINE 5 % EX PTCH: 1.0000 | MEDICATED_PATCH | CUTANEOUS | 0 refills | Status: AC

## 2024-01-20 NOTE — ED Triage Notes (Signed)
 Pt presents to the ED via [facility] from work with complaints of SOB with associated Chest tightness that started a few days ago. Pt states that the CP radiates to her L arm and endorses intense pain when raising that arm. Rates the pain 8/10. She notes taking Gabapentin  and Tylenol  around 1700 with no improvement. A&Ox4 at this time. Denies fevers, chills, dizziness.

## 2024-01-20 NOTE — Discharge Instructions (Signed)
 Please pick up the medication from the pharmacy and take as needed.  Follow-up with your PCP.

## 2024-01-20 NOTE — ED Provider Notes (Signed)
 [facility] Provider Note    Event Date/Time   First MD Initiated Contact with Patient 01/20/24 2146     (approximate)   History   Shortness of Breath and Chest Pain   HPI Regina Carpenter is a 48 y.o. female with history of rheumatoid arthritis, collagen vascular disease, tobacco abuse, inflammatory polyarthritis, on Eliquis  presenting today for chest pain.  Patient was seen for the same a couple days ago in the ED.  Laboratory workup was unremarkable at that time with nonischemic EKG and negative troponins.  Chest x-ray without any acute pathology.  Today she has the same symptoms with no change.  Still having pain when you press along her chest or she raises her left arm.  No new trauma.  No other new symptoms at this time.  Denies any missed doses of her Eliquis .     Physical Exam   Triage Vital Signs: ED Triage Vitals  Encounter Vitals Group     BP 01/20/24 2036 (!) 143/97     Systolic BP Percentile --      Diastolic BP Percentile --      Pulse Rate 01/20/24 2036 82     Resp 01/20/24 2036 16     Temp 01/20/24 2036 98.3 F (36.8 C)     Temp Source 01/20/24 2036 Oral     SpO2 01/20/24 2036 96 %     Weight 01/20/24 2038 255 lb (115.7 kg)     Height 01/20/24 2038 5\' 5"  (1.651 m)     Head Circumference --      Peak Flow --      Pain Score 01/20/24 2038 8     Pain Loc --      Pain Education --      Exclude from Growth Chart --     Most recent vital signs: Vitals:   01/20/24 2036  BP: (!) 143/97  Pulse: 82  Resp: 16  Temp: 98.3 F (36.8 C)  SpO2: 96%   I have reviewed the vital signs. General:  Awake, alert, no acute distress. Head:  Normocephalic, Atraumatic. EENT:  PERRL, EOMI, Oral mucosa pink and moist, Neck is supple. Cardiovascular: Regular rate, 2+ distal pulses. Respiratory:  Normal respiratory effort, symmetrical expansion, no distress.   Extremities:  Moving all four extremities through full ROM without pain.  Pain with  palpation over left chest wall as well as when you raise her left arm Neuro:  Alert and oriented.  Interacting appropriately.   Skin:  Warm, dry, no rash.   Psych: Appropriate affect.     ED Results / Procedures / Treatments   Labs (all labs ordered are listed, but only abnormal results are displayed) Labs Reviewed  COMPREHENSIVE METABOLIC PANEL WITH GFR - Abnormal; Notable for the following components:      Result Value   Glucose, Bld 110 (*)    All other components within normal limits  CBC WITH DIFFERENTIAL/PLATELET - Abnormal; Notable for the following components:   WBC 11.1 (*)    All other components within normal limits  TROPONIN I (HIGH SENSITIVITY)     EKG My EKG interpretation: Rate of 80, normal sinus rhythm, left axis deviation.  No acute ST elevations or depressions.   RADIOLOGY Independently interpreted chest x-ray with no acute pathology   PROCEDURES:  Critical Care performed: No  Procedures   MEDICATIONS ORDERED IN ED: Medications  lidocaine  (LIDODERM ) 5 % 1 patch (1 patch Transdermal Patch Applied 01/20/24 2209)  oxyCODONE -acetaminophen  (  PERCOCET/ROXICET) 5-325 MG per tablet 1 tablet (1 tablet Oral Given 01/20/24 2209)  methocarbamol  (ROBAXIN ) tablet 1,000 mg (1,000 mg Oral Given 01/20/24 2209)     IMPRESSION / MDM / ASSESSMENT AND PLAN / ED COURSE  I reviewed the triage vital signs and the nursing notes.                              Differential diagnosis includes, but is not limited to, musculoskeletal pain, low suspicion for pneumonia, pneumothorax, or ACS  Patient's presentation is most consistent with acute complicated illness / injury requiring diagnostic workup.  Patient is a 48 year old female with prior history of chronic pain syndrome presenting today for ongoing left-sided chest pain.  She has already been evaluated by her PCP and in the ED recently for similar symptoms.  She has palpable chest wall pain on the left side which is  unchanged from her evaluation 2 days ago.  EKG with no ischemic findings today.  Troponin continues to be negative and no indication to repeat.  CBC and CMP unremarkable.  Independently interpreted chest x-ray with no acute pathology.  Will attempt to treat her musculoskeletal pain today with Percocet, Robaxin , and Lidoderm  patch.  Patient was reassessed with no ongoing pain symptoms.  Feeling much better and would like to go home.  Will discharge with Robaxin  and Lidoderm  patches.  Told to follow-up with PCP.  The patient is on the cardiac monitor to evaluate for evidence of arrhythmia and/or significant heart rate changes.     FINAL CLINICAL IMPRESSION(S) / ED DIAGNOSES   Final diagnoses:  Chest wall pain     Rx / DC Orders   ED Discharge Orders          Ordered    lidocaine  (LIDODERM ) 5 %  Every 24 hours        01/20/24 2311    methocarbamol  (ROBAXIN ) 500 MG tablet  Every 8 hours PRN        01/20/24 2311             Note:  This document was prepared using Dragon voice recognition software and may include unintentional dictation errors.   Kandee Orion, MD 01/20/24 2312

## 2024-02-20 ENCOUNTER — Emergency Department
Admission: EM | Admit: 2024-02-20 | Discharge: 2024-02-20 | Disposition: A | Discharge status: Home / Self Care | Attending: Emergency Medicine | Admitting: Emergency Medicine

## 2024-02-20 ENCOUNTER — Other Ambulatory Visit: Payer: Self-pay

## 2024-02-20 DIAGNOSIS — L97919 Non-pressure chronic ulcer of unspecified part of right lower leg with unspecified severity: Secondary | ICD-10-CM | POA: Diagnosis present

## 2024-02-20 DIAGNOSIS — I10 Essential (primary) hypertension: Secondary | ICD-10-CM | POA: Diagnosis not present

## 2024-02-20 DIAGNOSIS — L97929 Non-pressure chronic ulcer of unspecified part of left lower leg with unspecified severity: Secondary | ICD-10-CM | POA: Diagnosis not present

## 2024-02-20 DIAGNOSIS — R52 Pain, unspecified: Secondary | ICD-10-CM

## 2024-02-20 DIAGNOSIS — F17200 Nicotine dependence, unspecified, uncomplicated: Secondary | ICD-10-CM | POA: Insufficient documentation

## 2024-02-20 MED ORDER — OXYCODONE HCL 5 MG PO TABS
5.0000 mg | ORAL_TABLET | Freq: Once | ORAL | Status: AC
  Administered 2024-02-20: 5 mg via ORAL
  Filled 2024-02-20: qty 1

## 2024-02-20 MED ORDER — OXYCODONE HCL 5 MG PO TABS: 5.0000 mg | ORAL_TABLET | Freq: Four times a day (QID) | ORAL | 0 refills | Status: DC | PRN

## 2024-02-20 NOTE — ED Notes (Signed)
 See triage note  Presents with some skin ulcers to right lower leg  and left foot  HX of same  States areas area very painful No fever

## 2024-02-20 NOTE — ED Triage Notes (Signed)
 Pt reports bilateral lower leg skin ulcers, pt reports she has a DR following the care of the wounds but was unable to sleep last night due to the pain.

## 2024-02-20 NOTE — Discharge Instructions (Signed)
 Call your doctor at [facility] for arrangements for continued medication.  Continue taking gabapentin  as prescribed by your doctor.  A prescription was sent to the pharmacy for you to pick up today.

## 2024-02-20 NOTE — ED Provider Notes (Signed)
 [facility] Provider Note    Event Date/Time   First MD Initiated Contact with Patient 02/20/24 [phone]     (approximate)   History   Skin Ulcer   HPI  Regina Carpenter is a 48 y.o. female   presents to the ED due to pain in her lower extremities that has kept her from being able to get any rest.  Patient is being seen by Dr. Rex Castor at [facility] and has a history of rheumatoid arthritis, hypertension, ischemic changes bilateral feet, inflammatory polyarthritis, collagen vascular disease and tobacco use.  Patient has had multiple amputation of the digits to her feet along with ulcerative areas that are in various degrees of healing.  Patient states that her doctor is sending her to a pain management clinic and she is out of pain medication.      Physical Exam   Triage Vital Signs: ED Triage Vitals  Encounter Vitals Group     BP 02/20/24 0647 (!) 145/99     Systolic BP Percentile --      Diastolic BP Percentile --      Pulse Rate 02/20/24 0647 73     Resp 02/20/24 0647 18     Temp 02/20/24 0647 98.5 F (36.9 C)     Temp Source 02/20/24 0647 Oral     SpO2 02/20/24 0647 100 %     Weight 02/20/24 0646 260 lb (117.9 kg)     Height 02/20/24 0646 5\' 5"  (1.651 m)     Head Circumference --      Peak Flow --      Pain Score 02/20/24 0646 10     Pain Loc --      Pain Education --      Exclude from Growth Chart --     Most recent vital signs: Vitals:   02/20/24 0647  BP: (!) 145/99  Pulse: 73  Resp: 18  Temp: 98.5 F (36.9 C)  SpO2: 100%     General: Awake, no distress.  Tearful. CV:  Good peripheral perfusion.  Resp:  Normal effort.  Abd:  No distention.  Other:  Multiple skin areas with healing ulcerative areas.  No evidence of infection.  There are multiple amputations of the digits.  No cellulitis or evidence of infection.  Areas appear to be chronic.   ED Results / Procedures / Treatments   Labs (all labs ordered are  listed, but only abnormal results are displayed) Labs Reviewed - No data to display    PROCEDURES:  Critical Care performed:   Procedures   MEDICATIONS ORDERED IN ED: Medications  oxyCODONE  (Oxy IR/ROXICODONE ) immediate release tablet 5 mg (5 mg Oral Given 02/20/24 0729)     IMPRESSION / MDM / ASSESSMENT AND PLAN / ED COURSE  I reviewed the triage vital signs and the nursing notes.   Differential diagnosis includes, but is not limited to, encounter for pain management.  48 year old female presents to the ED requesting pain medication until she can talk to her doctor.  Patient has: Vascular disease and multiple amputations of the digits in her feet.  No evidence of infection.  Patient was given oxycodone  5 mg while in the ED and a prescription for the same was sent to the pharmacy.  She is encouraged to call her PCP.  She states that the nursing staff there tells her to come to the emergency department for refills on pain medication.  She does have an appointment on 02/29/2024 for  the pain management clinic.      Patient's presentation is most consistent with exacerbation of chronic illness.  FINAL CLINICAL IMPRESSION(S) / ED DIAGNOSES   Final diagnoses:  Encounter for pain management     Rx / DC Orders   ED Discharge Orders          Ordered    oxyCODONE  (OXY IR/ROXICODONE ) 5 MG immediate release tablet  Every 6 hours PRN        02/20/24 0730             Note:  This document was prepared using Dragon voice recognition software and may include unintentional dictation errors.   Stafford Eagles, PA-C 02/20/24 1478    Bryson Carbine, MD 02/20/24 [phone]

## 2024-02-28 DIAGNOSIS — Z789 Other specified health status: Secondary | ICD-10-CM | POA: Insufficient documentation

## 2024-02-28 DIAGNOSIS — M899 Disorder of bone, unspecified: Secondary | ICD-10-CM | POA: Insufficient documentation

## 2024-02-28 DIAGNOSIS — G894 Chronic pain syndrome: Secondary | ICD-10-CM | POA: Insufficient documentation

## 2024-02-28 DIAGNOSIS — Z79899 Other long term (current) drug therapy: Secondary | ICD-10-CM | POA: Insufficient documentation

## 2024-02-28 NOTE — Patient Instructions (Signed)

## 2024-02-28 NOTE — Progress Notes (Unsigned)
 PROVIDER NOTE: Interpretation of information contained herein should be left to medically-trained personnel. Specific patient instructions are provided elsewhere under "Patient Instructions" section of medical record. This document was created in part using AI and STT-dictation technology, any transcriptional errors that may result from this process are unintentional.  Patient: Regina Carpenter  Service: E/M Encounter  Provider: Candi Chafe, MD  DOB: Sep 23, 1976  Delivery: Face-to-face  Specialty: Interventional Pain Management  MRN: [id number]  Setting: Ambulatory outpatient facility  Specialty designation: 09  Type: New Patient  Location: Outpatient office facility  PCP: [facility]  DOS: 02/29/2024    Referring Prov.: Regina Castor, MD   Primary Reason(s) for Visit: Encounter for initial evaluation of one or more chronic problems (new to examiner) potentially causing chronic pain, and posing a threat to normal musculoskeletal function. (Level of risk: High) CC: No chief complaint on file.  HPI  Regina Carpenter is a 48 y.o. year old, female patient, who comes for the first time to our practice referred by Regina Castor, MD for our initial evaluation of her chronic pain. 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 Today she comes in for evaluation of her No chief complaint on file.  Pain Assessment: Location:     Radiating:   Onset:   Duration:   Quality:   Severity:  /10 (subjective, self-reported pain score)  Effect on ADL:   Timing:   Modifying factors:   BP:    HR:    Onset and Duration: {Hx; Onset and Duration:210120511} Cause of pain: {Hx; Cause:210120521} Severity: {Pain Severity:210120502} Timing: {Symptoms; Timing:210120501} Aggravating Factors: {Causes; Aggravating pain factors:210120507} Alleviating Factors: {Causes; Alleviating Factors:210120500} Associated Problems: {Hx; Associated problems:210120515} Quality of Pain: {Hx; Symptom quality or Descriptor:210120531} Previous Examinations or Tests: {Hx; Previous examinations or test:210120529} Previous Treatments: {Hx; Previous Treatment:210120503}  Regina Carpenter is being evaluated for possible interventional pain management therapies for the treatment of her chronic pain.  Discussed the use of AI scribe software for clinical note transcription with the patient, who gave verbal consent to proceed.  History of Present Illness           ***  Regina Carpenter has been informed that this initial visit was an evaluation only.  On the follow up appointment I will go over the results, including ordered tests and available interventional therapies. At that time she will have the opportunity to decide whether to proceed with offered therapies or not. In the event that Regina Carpenter prefers avoiding interventional options, this will conclude our involvement in the case.  Medication management recommendations may be provided upon request.  Patient informed that diagnostic tests may be ordered to assist in identifying underlying causes, narrow the list of differential diagnoses and aid in determining  candidacy for (or contraindications to) planned therapeutic interventions.  Historic Controlled Substance Pharmacotherapy Review  PMP and historical list of controlled substances: Oxycodone  IR 5 mg  tablet, 1 tab p.o. 4 times daily (# 15) (last filled on 02/20/2024); gabapentin  300 mg capsule, 1 cap p.o. 3 times daily (90/month) (last filled on 02/17/2024); oxycodone /APAP 5/325 (# 15) (last filled on 02/02/2024); pregabalin 50 mg capsule, 1 cap p.o. twice daily (60/month) (last filled on 01/16/2024) Most recently prescribed opioid analgesics: Oxycodone  IR 5 mg tablet, 1 tab p.o. 4 times daily (# 15) (last filled on 02/20/2024) MME/day: 28.13 mg/day  Historical Monitoring: The patient  reports no history of drug use. List of prior UDS Testing: Lab Results  Component Value Date   MDMA NONE DETECTED 05/19/2020   MDMA NONE DETECTED 11/23/2016   COCAINSCRNUR NONE DETECTED 05/19/2020   COCAINSCRNUR NONE DETECTED 11/23/2016   PCPSCRNUR NONE DETECTED 05/19/2020   PCPSCRNUR NONE DETECTED 11/23/2016   THCU NONE DETECTED 05/19/2020   THCU POSITIVE (A) 11/23/2016   Historical Background Evaluation: [place] PMP: PDMP reviewed during this encounter. Review of the past 61-months conducted.             PMP NARX Score Report:  Narcotic: 400 Sedative: 270 Stimulant: 000 [place] Department of public safety, offender search: Engineer, mining Information) Non-contributory Risk Assessment Profile: Aberrant behavior: None observed or detected today Risk factors for fatal opioid overdose: None identified today PMP NARX Overdose Risk Score: 350 Fatal overdose hazard ratio (HR): Calculation deferred Non-fatal overdose hazard ratio (HR): Calculation deferred Risk of opioid abuse or dependence: 0.7-3.0% with doses <= 36 MME/day and 6.1-26% with doses >= 120 MME/day. Substance use disorder (SUD) risk level: See below Personal History of Substance Abuse (SUD-Substance use disorder):  Alcohol:    Illegal Drugs:    Rx Drugs:    ORT  Risk Level calculation:    ORT Scoring interpretation table:  Score <3 = Low Risk for SUD  Score between 4-7 = Moderate Risk for SUD  Score >8 = High Risk for Opioid Abuse   PHQ-2 Depression Scale:  Total score:    PHQ-2 Scoring interpretation table: (Score and probability of major depressive disorder)  Score 0 = No depression  Score 1 = 15.4% Probability  Score 2 = 21.1% Probability  Score 3 = 38.4% Probability  Score 4 = 45.5% Probability  Score 5 = 56.4% Probability  Score 6 = 78.6% Probability   PHQ-9 Depression Scale:  Total score:    PHQ-9 Scoring interpretation table:  Score 0-4 = No depression  Score 5-9 = Mild depression  Score 10-14 = Moderate depression  Score 15-19 = Moderately severe depression  Score 20-27 = Severe depression (2.4 times higher risk of SUD and 2.89 times higher risk of overuse)   Pharmacologic Plan: As per protocol, I have not taken over any controlled substance management, pending the results of ordered tests and/or consults.            Initial impression: Pending review of available data and ordered tests.  Meds   Current Outpatient Medications:    albuterol  (VENTOLIN  HFA) 108 (90 Base) MCG/ACT inhaler, Inhale 2 puffs into the lungs every 6 (six) hours as needed for wheezing or shortness of breath., Disp: 8 g, Rfl: 0   amLODipine  (NORVASC ) 5 MG tablet, Take 1 tablet (5 mg total) by mouth daily., Disp: 30 tablet, Rfl: 0   amLODipine  (NORVASC ) 5 MG tablet, Take by mouth., Disp: , Rfl:    collagenase  (SANTYL ) ointment, Apply topically daily. (Patient not taking: Reported on 03/30/2021), Disp: 15 g, Rfl: 0   cyclophosphamide  (CYTOXAN ) 25 MG capsule, Take by mouth. (Patient not taking:  Reported on 03/30/2021), Disp: , Rfl:    cyclophosphamide  (CYTOXAN ) 50 MG capsule, Take 2 capsules (100 mg total) by mouth daily. Take with food to minimize GI upset. Take early in the day and maintain hydration., Disp: 60 capsule, Rfl: 0   dapsone 100 MG tablet, Take 100  mg by mouth daily., Disp: , Rfl:    ELIQUIS  5 MG TABS tablet, Take 5 mg by mouth 2 (two) times daily., Disp: , Rfl:    famotidine  (PEPCID ) 20 MG tablet, Take 1 tablet (20 mg total) by mouth daily. (Patient not taking: Reported on 03/30/2021), Disp: 30 tablet, Rfl: 0   famotidine  (PEPCID ) 20 MG tablet, Take by mouth. (Patient not taking: Reported on 03/30/2021), Disp: , Rfl:    gabapentin  (NEURONTIN ) 600 MG tablet, Take 0.5 tablets (300 mg total) by mouth 3 (three) times daily. (Patient not taking: Reported on 03/30/2021), Disp: 45 tablet, Rfl: 0   guaiFENesin  200 MG tablet, Take 2 tablets (400 mg total) by mouth every 6 (six) hours as needed for cough or to loosen phlegm., Disp: 20 tablet, Rfl: 0   ketorolac  (ACULAR ) 0.5 % ophthalmic solution, Place 1 drop into the left eye 4 (four) times daily. (Patient not taking: Reported on 03/30/2021), Disp: 5 mL, Rfl: 0   methocarbamol  (ROBAXIN ) 500 MG tablet, Take 1 tablet (500 mg total) by mouth every 8 (eight) hours as needed., Disp: 30 tablet, Rfl: 0   nicotine  (NICODERM CQ  - DOSED IN MG/24 HOURS) 14 mg/24hr patch, Place 1 patch (14 mg total) onto the skin daily., Disp: 28 patch, Rfl: 0   nicotine  (NICODERM CQ  - DOSED IN MG/24 HOURS) 14 mg/24hr patch, Place onto the skin., Disp: , Rfl:    ondansetron  (ZOFRAN -ODT) 4 MG disintegrating tablet, Take 1 tablet (4 mg total) by mouth every 8 (eight) hours as needed for nausea or vomiting., Disp: 20 tablet, Rfl: 0   oxyCODONE  (OXY IR/ROXICODONE ) 5 MG immediate release tablet, Take 1 tablet (5 mg total) by mouth every 6 (six) hours as needed., Disp: 15 tablet, Rfl: 0   saccharomyces boulardii (FLORASTOR) 250 MG capsule, Take 1 capsule (250 mg total) by mouth 2 (two) times daily., Disp: 60 capsule, Rfl: 0  Imaging Review  Cervical Imaging: Cervical MR w/wo contrast: Results for orders placed during the hospital encounter of 11/23/16 MR Cervical Spine W or Wo Contrast  Narrative CLINICAL DATA:  48 y/o F; neck pain with  concern for mass or infection.  EXAM: MRI CERVICAL SPINE WITHOUT AND WITH CONTRAST  TECHNIQUE: Multiplanar and multiecho pulse sequences of the cervical spine, to include the craniocervical junction and cervicothoracic junction, were obtained without and with intravenous contrast.  CONTRAST:  15mL MULTIHANCE  GADOBENATE DIMEGLUMINE  529 MG/ML IV SOLN  COMPARISON:  None.  FINDINGS: Alignment: Straightening of cervical lordosis with slight reversal at the C5-6 level.  Vertebrae: No evidence of acute fracture or discitis. There is edema and enhancement within the right C5-6 facet (series 6, image 1 and series 11, image 17).  Cord: No abnormal cord signal or cord enhancement. No epidural collection.  Posterior Fossa, vertebral arteries, paraspinal tissues: Mild enhancement and edema within the posterior paraspinal muscles of the neck in the suboccipital region and deep to the nuchal ligament from C2-C4. No discrete fluid collection.  Disc levels:  C2-3: No significant disc displacement, foraminal narrowing, or canal stenosis.  C3-4: No significant disc displacement, foraminal narrowing, or canal stenosis.  C4-5: No significant disc displacement, foraminal narrowing, or canal stenosis.  C5-6: Central  disc protrusion with mild anterior cord impingement and flattening. Bilateral uncovertebral and facet hypertrophy with mild foraminal narrowing. No significant canal stenosis.  C6-7: Minimal disc bulge. No significant foraminal narrowing or canal stenosis.  C7-T1: No significant disc displacement, foraminal narrowing, or canal stenosis.  IMPRESSION: 1. Mild enhancement and edema within the posterior paraspinal muscles in the suboccipital region and deep to the nuchal ligament from C2 through C4. Findings may represents traumatic, infectious, or inflammatory myositis. No discrete fluid collection is identified. 2. Mild edema and enhancement within the right C5-6 facet  is probably degenerative. 3. No evidence for discitis, epidural collection, or mass. 4. Mild cervical spondylosis predominantly at the C5-6 level where a small central disc protrusion impinges the anterior cord with flattening. No high-grade canal stenosis or cord compression.   Electronically Signed By: Dorlene Gary M.D. On: 11/23/2016 15:51  Cervical CT wo contrast: Results for orders placed during the hospital encounter of 09/15/23 CT Cervical Spine Wo Contrast  Narrative CLINICAL DATA:  Provided history: Neck pain, acute, no red flags. Immunosuppressed.  EXAM: CT CERVICAL SPINE WITHOUT CONTRAST  TECHNIQUE: Multidetector CT imaging of the cervical spine was performed without intravenous contrast. Multiplanar CT image reconstructions were also generated.  RADIATION DOSE REDUCTION: This exam was performed according to the departmental dose-optimization program which includes automated exposure control, adjustment of the mA and/or kV according to patient size and/or use of iterative reconstruction technique.  COMPARISON:  Cervical spine CT 09/10/2022.  FINDINGS: Alignment: Nonspecific reversal of the expected cervical lordosis. No significant spondylolisthesis.  Skull base and vertebrae: The basion-dental and atlanto-dental intervals are maintained. No evidence of a cervical spine fracture.  Soft tissues and spinal canal: No prevertebral soft tissue swelling. No paraspinal mass or collection.  Disc levels:  Mild disc space narrowing at C5-C6 and C6-C7.  C2-C3: Slight disc bulge. No significant spinal canal stenosis is appreciated. No significant bony neural foraminal narrowing.  C3-C4: Slight disc bulge. No significant spinal canal stenosis is appreciated. No significant bony neural foraminal narrowing.  C4-C5: Slight disc bulge. No significant spinal canal stenosis is appreciated. No significant bony neural foraminal narrowing.  C5-C6: Slight disc  bulge. Minimal endplate spurring and bilateral uncovertebral hypertrophy. No significant spinal canal stenosis is appreciated. No significant bony neural foraminal narrowing.  C6-C7: Slight disc bulge. Minimal endplate spurring and uncovertebral hypertrophy. No significant spinal canal stenosis is appreciated. No significant bony neural foraminal narrowing.  C7-T1: Beam hardening artifact limits evaluation of the spinal canal this level. Within this limitation, no disc herniation or significant spinal canal stenosis is appreciated. No significant bony neural foraminal narrowing.  Upper chest: No consolidation within the imaged lung apices.  IMPRESSION: 1. Beam hardening artifact limits evaluation of the spinal canal at the C7-T1 level. Within this limitation, findings are as follows. 2. Mild cervical spondylosis as outlined. No significant spinal canal stenosis is appreciated. No significant bony neural foraminal narrowing. 3. No non-contrast CT evidence of discitis/osteomyelitis. However please note, a cervical spine MRI (with and without contrast) would have greater sensitivity.   Electronically Signed By: Bascom Lily D.O. On: 09/15/2023 10:02  Cervical DG 2-3 views: Results for orders placed during the hospital encounter of 08/06/19 DG Cervical Spine 2-3 Views  Narrative CLINICAL DATA:  Neck and left shoulder pain following a fall down steps yesterday.  EXAM: CERVICAL SPINE - 2-3 VIEW  COMPARISON:  Cervical spine MR dated 11/23/2016  FINDINGS: Again demonstrated is mild reversal of the normal cervical lordosis at the C5-6  level. No prevertebral soft tissue swelling, fracture or subluxation seen.  IMPRESSION: Stable mild reversal of the normal cervical lordosis. No fracture or subluxation.   Electronically Signed By: Catherin Closs M.D. On: 08/06/2019 09:12  Shoulder Imaging: Shoulder-R DG: Results for orders placed during the hospital encounter of  09/15/23 DG Shoulder Right  Narrative CLINICAL DATA:  Shoulder/neck pain.  No known injury.  EXAM: RIGHT SHOULDER - 2+ VIEW  COMPARISON:  12/16/2019.  FINDINGS: No acute fracture or dislocation.  No aggressive osseous lesion.  Glenohumeral and acromioclavicular joints are normal in alignment.  Minimal osteoarthritis of the acromioclavicular joint. No significant arthritis of glenohumeral joint.  No soft tissue swelling.  No radiopaque foreign bodies.  IMPRESSION: *No acute osseous abnormality of the right shoulder joint. Minimal acromioclavicular joint degenerative osteoarthritis.   Electronically Signed By: Beula Brunswick M.D. On: 09/15/2023 09:39  Shoulder-L DG: Results for orders placed during the hospital encounter of 09/25/23 DG Shoulder Left  Narrative CLINICAL DATA:  Left shoulder pain x3 weeks.  EXAM: LEFT SHOULDER - 2+ VIEW  COMPARISON:  August 06, 2019  FINDINGS: There is no evidence of fracture or dislocation. There is no evidence of arthropathy or other focal bone abnormality. Soft tissues are unremarkable.  IMPRESSION: Negative.   Electronically Signed By: Virgle Grime M.D. On: 09/25/2023 02:18  Knee Imaging: Knee-L DG 1-2 views: Results for orders placed during the hospital encounter of 01/18/24 DG Knee 2 Views Left  Narrative CLINICAL DATA:  Left knee pain, no known injury, initial encounter  EXAM: LEFT KNEE - 1-2 VIEW  COMPARISON:  08/14/2022  FINDINGS: No evidence of fracture, dislocation, or joint effusion. No evidence of arthropathy or other focal bone abnormality. Soft tissues are unremarkable.  IMPRESSION: No acute abnormality noted.   Electronically Signed By: Violeta Grey M.D. On: 01/18/2024 03:39  Knee-L DG 4 views: Results for orders placed during the hospital encounter of 08/14/22 DG Knee Complete 4 Views Left  Narrative CLINICAL DATA:  Pain starting 3 weeks ago. No trauma history  is submitted.  EXAM: LEFT KNEE - COMPLETE 4+ VIEW  COMPARISON:  None Available.  FINDINGS: No acute fracture or dislocation. No joint effusion. There may be mild medial compartment joint space narrowing.  IMPRESSION: No acute osseous abnormality.   Electronically Signed By: Lore Rode M.D. On: 08/14/2022 11:31  Foot Imaging: Foot-R DG Complete: Results for orders placed during the hospital encounter of 07/17/20 DG Foot Complete Right  Narrative CLINICAL DATA:  Cellulitis.  Concern for osteomyelitis.  EXAM: RIGHT FOOT COMPLETE - 3+ VIEW  COMPARISON:  Right foot radiographs 05/16/2020  FINDINGS: Forefoot amputation at the level of the metatarsals is noted. Soft tissue swelling is present anterior to the osteotomy sites. Residual metatarsals are within normal limits. No osseous erosion is present. No gas is present in the soft tissues. Midfoot and hindfoot are within normal limits.  IMPRESSION: 1. Forefoot amputation without radiographic evidence for osteomyelitis. 2. Soft tissue swelling anterior to the osteotomy sites.   Electronically Signed By: Audree Leas M.D. On: 07/17/2020 06:13  Foot-L DG Complete: Results for orders placed during the hospital encounter of 07/17/20 DG Foot Complete Left  Narrative CLINICAL DATA:  Cellulitis, concern for osteomyelitis  EXAM: LEFT FOOT - COMPLETE 3+ VIEW  COMPARISON:  None.  FINDINGS: No fracture or dislocation is seen.  The joint spaces are preserved.  The visualized soft tissues are unremarkable.  No osseous destruction to suggest osteomyelitis.  IMPRESSION: No radiographic findings to suggest  osteomyelitis.   Electronically Signed By: Zadie Herter M.D. On: 07/17/2020 06:13  Complexity Note: Imaging results reviewed.                         ROS  Cardiovascular: {Hx; Cardiovascular History:210120525} Pulmonary or Respiratory: {Hx; Pumonary and/or Respiratory  History:210120523} Neurological: {Hx; Neurological:210120504} Psychological-Psychiatric: {Hx; Psychological-Psychiatric History:210120512} Gastrointestinal: {Hx; Gastrointestinal:210120527} Genitourinary: {Hx; Genitourinary:210120506} Hematological: {Hx; Hematological:210120510} Endocrine: {Hx; Endocrine history:210120509} Rheumatologic: {Hx; Rheumatological:210120530} Musculoskeletal: {Hx; Musculoskeletal:210120528} Work History: {Hx; Work history:210120514}  Allergies  Ms. Buenaventura is allergic to ibuprofen, sulfa  antibiotics, and sulfasalazine.  Laboratory Chemistry Profile   Renal Lab Results  Component Value Date   BUN 17 01/20/2024   CREATININE 0.78 01/20/2024   GFRAA >60 05/23/2020   GFRNONAA >60 01/20/2024   PROTEINUR NEGATIVE 03/12/2023     Electrolytes Lab Results  Component Value Date   NA 139 01/20/2024   K 4.2 01/20/2024   CL 102 01/20/2024   CALCIUM 9.3 01/20/2024   MG 2.3 09/25/2023     Hepatic Lab Results  Component Value Date   AST 17 01/20/2024   ALT 13 01/20/2024   ALBUMIN 3.8 01/20/2024   ALKPHOS 73 01/20/2024   LIPASE 24 11/09/2023     ID Lab Results  Component Value Date   HIV Non Reactive 05/16/2020   SARSCOV2NAA NEGATIVE 11/09/2023   STAPHAUREUS POSITIVE (A) 05/19/2020   MRSAPCR NEGATIVE 07/18/2020   PREGTESTUR Negative 11/10/2023     Bone No results found for: "VD25OH", "VD125OH2TOT", "PP2951OA4", "VD2125OH2", "25OHVITD1", "25OHVITD2", "25OHVITD3", "TESTOFREE", "TESTOSTERONE"   Endocrine Lab Results  Component Value Date   GLUCOSE 110 (H) 01/20/2024   GLUCOSEU NEGATIVE 03/12/2023   HGBA1C 5.4 11/14/2016   TSH 0.843 11/23/2016     Neuropathy Lab Results  Component Value Date   HGBA1C 5.4 11/14/2016   HIV Non Reactive 05/16/2020     CNS No results found for: "COLORCSF", "APPEARCSF", "RBCCOUNTCSF", "WBCCSF", "POLYSCSF", "LYMPHSCSF", "EOSCSF", "PROTEINCSF", "GLUCCSF", "JCVIRUS", "CSFOLI", "IGGCSF", "LABACHR", "ACETBL"    Inflammation (CRP: Acute  ESR: Chronic) Lab Results  Component Value Date   CRP 3.1 (H) 07/21/2020   ESRSEDRATE 27 (H) 07/17/2020   LATICACIDVEN 1.7 07/17/2020     Rheumatology Lab Results  Component Value Date   RF 19.2 (H) 11/23/2016   LABURIC 2.4 11/24/2016     Coagulation Lab Results  Component Value Date   INR 1.1 07/18/2020   LABPROT 13.6 07/18/2020   PLT 257 01/20/2024     Cardiovascular Lab Results  Component Value Date   BNP 15.5 01/06/2024   CKTOTAL 79 03/11/2023   HGB 12.7 01/20/2024   HCT 38.9 01/20/2024     Screening Lab Results  Component Value Date   SARSCOV2NAA NEGATIVE 11/09/2023   COVIDSOURCE NASOPHARYNGEAL 06/23/2019   STAPHAUREUS POSITIVE (A) 05/19/2020   MRSAPCR NEGATIVE 07/18/2020   HIV Non Reactive 05/16/2020   PREGTESTUR Negative 11/10/2023     Cancer No results found for: "CEA", "CA125", "LABCA2"   Allergens No results found for: "ALMOND", "APPLE", "ASPARAGUS", "AVOCADO", "BANANA", "BARLEY", "BASIL", "BAYLEAF", "GREENBEAN", "LIMABEAN", "WHITEBEAN", "BEEFIGE", "REDBEET", "BLUEBERRY", "BROCCOLI", "CABBAGE", "MELON", "CARROT", "CASEIN", "CASHEWNUT", "CAULIFLOWER", "CELERY"     Note: Lab results reviewed.  PFSH  Drug: Ms. Saefong  reports no history of drug use. Alcohol:  reports no history of alcohol use. Tobacco:  reports that she has been smoking cigarettes. She has never used smokeless tobacco. Medical:  has a past medical history of Arthralgia of right knee, Arthritis, Cellulitis of left leg, Collagen  vascular disease (HCC), Lymphadenopathy, mesenteric, Right hand pain, and Tobacco abuse. Family: family history includes Alcohol abuse in her father; Leukemia in her maternal grandfather.  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   Procedure: TRANSMETATARSAL AMPUTATION;  Surgeon: Angel Barba, DPM;  Location: [facility] ORS;  Service: Podiatry;  Laterality: Right;   Active Ambulatory Problems    Diagnosis Date Noted   Abdominal pain 11/14/2016   Lymphadenopathy, mesenteric 06/10/2020   Arthralgia of right knee 04/02/2015   Cellulitis of left leg 03/21/2015   Right hand pain 03/21/2015   Tobacco use 03/21/2015   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   Hyponatremia 11/25/2016   Hypokalemia 11/25/2016   Leukocytosis 11/25/2016   Generalized weakness 11/25/2016   Generalized pain 11/25/2016   Conjunctivitis 11/25/2016   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   Sepsis (HCC) 05/16/2020   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) 05/17/2020   Rheumatoid arthritis (HCC) 05/17/2020   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   Essential hypertension 05/17/2020   Smoking 05/17/2020   Bilateral lower leg cellulitis 03/21/2015   Chronic anemia 08/12/2020   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   Infection of wound due to methicillin resistant Staphylococcus aureus (MRSA) 04/30/2021   Open wound of both lower extremities with complication 08/12/2020   PE (pulmonary thromboembolism) (HCC) 08/13/2020   Vasculitis (HCC) 05/09/2020   Pharmacologic therapy 02/28/2024   Disorder of skeletal system 02/28/2024   Problems influencing health status 02/28/2024   Chronic pain syndrome 02/28/2024   Resolved Ambulatory Problems    Diagnosis Date Noted   No Resolved Ambulatory Problems   Past Medical History:  Diagnosis Date   Arthritis    Collagen vascular disease (HCC)    Tobacco abuse    Constitutional Exam  General appearance: Well nourished, well developed, and well hydrated. In no apparent acute distress There were no vitals filed for this visit. BMI Assessment: Estimated body mass index is 43.27  kg/m as calculated from the following:   Height as of 02/20/24: 5\' 5"  (1.651 m).   Weight as of 02/20/24: 260 lb (117.9 kg).  BMI interpretation table: BMI level Category Range association with higher incidence of chronic pain  <18 kg/m2 Underweight   18.5-24.9 kg/m2 Ideal body weight   25-29.9 kg/m2 Overweight Increased incidence by 20%  30-34.9 kg/m2 Obese (Class I) Increased incidence by 68%  35-39.9 kg/m2 Severe obesity (Class II) Increased incidence by 136%  >40 kg/m2 Extreme obesity (Class III) Increased incidence by 254%   Patient's current BMI Ideal Body weight  There is no height or weight on file to calculate BMI. Ideal body weight: 57 kg (125 lb 10.6 oz) Adjusted ideal body weight: 81.4 kg (179 lb 6.4 oz)   BMI Readings from Last 4 Encounters:  02/20/24 43.27 kg/m  01/20/24 42.43 kg/m  01/18/24 41.60 kg/m  01/06/24 43.25 kg/m   Wt Readings from Last 4 Encounters:  02/20/24 260 lb (117.9 kg)  01/20/24 255 lb (115.7 kg)  01/18/24 250 lb (113.4 kg)  01/06/24 259 lb 14.8 oz (117.9 kg)    Psych/Mental status: Alert, oriented x 3 (person, place, & time)       Eyes: PERLA Respiratory: No evidence of acute respiratory  distress  Assessment  Primary Diagnosis & Pertinent Problem List: The primary encounter diagnosis was Chronic pain syndrome. Diagnoses of Pharmacologic therapy, Disorder of skeletal system, and Problems influencing health status were also pertinent to this visit.  Visit Diagnosis (New problems to examiner): 1. Chronic pain syndrome   2. Pharmacologic therapy   3. Disorder of skeletal system   4. Problems influencing health status    Plan of Care (Initial workup plan)  Note: Ms. Kassem was reminded that as per protocol, today's visit has been an evaluation only. We have not taken over the patient's controlled substance management.  Problem-specific plan: Assessment and Plan            Lab Orders  No laboratory test(s) ordered today   Imaging  Orders  No imaging studies ordered today   Referral Orders  No referral(s) requested today   Procedure Orders    No procedure(s) ordered today   Pharmacotherapy (current): Medications ordered:  No orders of the defined types were placed in this encounter.  Medications administered during this visit: Tempest Frankland had no medications administered during this visit.   Analgesic Pharmacotherapy:  Opioid Analgesics: For patients currently taking or requesting to take opioid analgesics, in accordance with [place]  Medical Board Guidelines, we will assess their risks and indications for the use of these substances. After completing our evaluation, we may offer recommendations, but we no longer take patients for medication management. The prescribing physician will ultimately decide, based on his/her training and level of comfort whether to adopt any of the recommendations, including whether or not to prescribe such medicines.  Membrane stabilizer: To be determined at a later time  Muscle relaxant: To be determined at a later time  NSAID: To be determined at a later time  Other analgesic(s): To be determined at a later time   Interventional management options: Ms. Schermerhorn was informed that there is no guarantee that she would be a candidate for interventional therapies. The decision will be based on the results of diagnostic studies, as well as Ms. Churchman's risk profile.  Procedure(s) under consideration:  Pending results of ordered studies     Interventional Therapies  Risk Factors  Considerations  Medical Comorbidities:     Planned  Pending:      Under consideration:   Pending   Completed: (Analgesic benefit)1  None at this time   Therapeutic  Palliative (PRN) options:   None established   Completed by other providers:   None reported  1(Analgesic benefit): Expressed in percentage (%). (Local anesthetic[LA] +/- sedation  L.A.Local Anesthetic  Steroid benefit   Ongoing benefit)   Provider-requested follow-up: No follow-ups on file.  Future Appointments  Date Time Provider Department Center  02/29/2024 10:00 AM Renaldo Caroli, MD [facility] None  03/01/2024 10:30 AM [facility] None  03/01/2024 11:30 AM Pace, Valentin Gaskins, NP [facility] None   I discussed the assessment and treatment plan with the patient. The patient was provided an opportunity to ask questions and all were answered. The patient agreed with the plan and demonstrated an understanding of the instructions.  Patient advised to call back or seek an in-person evaluation if the symptoms or condition worsens.  Duration of encounter: *** minutes.  Total time on encounter, as per AMA guidelines included both the face-to-face and non-face-to-face time personally spent by the physician and/or other qualified health care professional(s) on the day of the encounter (includes time in activities that require the physician or other qualified health  care professional and does not include time in activities normally performed by clinical staff). Physician's time may include the following activities when performed: Preparing to see the patient (e.g., pre-charting review of records, searching for previously ordered imaging, lab work, and nerve conduction tests) Review of prior analgesic pharmacotherapies. Reviewing PMP Interpreting ordered tests (e.g., lab work, imaging, nerve conduction tests) Performing post-procedure evaluations, including interpretation of diagnostic procedures Obtaining and/or reviewing separately obtained history Performing a medically appropriate examination and/or evaluation Counseling and educating the patient/family/caregiver Ordering medications, tests, or procedures Referring and communicating with other health care professionals (when not separately reported) Documenting clinical information in the electronic or other health record Independently interpreting results  (not separately reported) and communicating results to the patient/ family/caregiver Care coordination (not separately reported)  Note by: Candi Chafe, MD (TTS and AI technology used. I apologize for any typographical errors that were not detected and corrected.) Date: 02/29/2024; Time: 4:56 PM

## 2024-02-29 ENCOUNTER — Ambulatory Visit (HOSPITAL_BASED_OUTPATIENT_CLINIC_OR_DEPARTMENT_OTHER): Admitting: Pain Medicine

## 2024-02-29 ENCOUNTER — Other Ambulatory Visit (INDEPENDENT_AMBULATORY_CARE_PROVIDER_SITE_OTHER): Payer: Self-pay | Admitting: Vascular Surgery

## 2024-02-29 DIAGNOSIS — I70238 Atherosclerosis of native arteries of right leg with ulceration of other part of lower right leg: Secondary | ICD-10-CM

## 2024-02-29 DIAGNOSIS — Z79899 Other long term (current) drug therapy: Secondary | ICD-10-CM

## 2024-02-29 DIAGNOSIS — G894 Chronic pain syndrome: Secondary | ICD-10-CM

## 2024-02-29 DIAGNOSIS — Z91199 Patient's noncompliance with other medical treatment and regimen due to unspecified reason: Secondary | ICD-10-CM

## 2024-02-29 DIAGNOSIS — Z789 Other specified health status: Secondary | ICD-10-CM

## 2024-02-29 DIAGNOSIS — M899 Disorder of bone, unspecified: Secondary | ICD-10-CM

## 2024-02-29 DIAGNOSIS — G8929 Other chronic pain: Secondary | ICD-10-CM

## 2024-03-01 ENCOUNTER — Encounter (INDEPENDENT_AMBULATORY_CARE_PROVIDER_SITE_OTHER): Payer: Self-pay

## 2024-03-01 ENCOUNTER — Encounter (INDEPENDENT_AMBULATORY_CARE_PROVIDER_SITE_OTHER): Payer: Self-pay | Admitting: Vascular Surgery

## 2024-03-13 NOTE — Progress Notes (Unsigned)
 PROVIDER NOTE: Interpretation of information contained herein should be left to medically-trained personnel. Specific patient instructions are provided elsewhere under Patient Instructions section of medical record. This document was created in part using AI and STT-dictation technology, any transcriptional errors that may result from this process are unintentional.  Patient: Regina Carpenter  Service: E/M Encounter  Provider: Candi Chafe, MD  DOB: Feb 15, 1976  Delivery: Face-to-face  Specialty: Interventional Pain Management  MRN: [id number]  Setting: Ambulatory outpatient facility  Specialty designation: 09  Type: New Patient  Location: Outpatient office facility  [facility]P: [facility]  DOS: 03/14/2024    Referring Prov.: [facility]   Primary Reason(s) for Visit: Encounter for initial evaluation of one or more chronic problems (new to examiner) potentially causing chronic pain, and posing a threat to normal musculoskeletal function. (Level of risk: High) CC: Leg Pain (Bilateral, usually below the knee, sometimes above the knee)  HPI  Regina Carpenter is a 48 y.o. year old, female patient, who comes for the first time to our practice referred by [facility] for our initial evaluation of her chronic pain. 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); Rheumatoid arthritis (HCC); 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HCC); Essential hypertension; Smoking; Bilateral lower leg cellulitis; Chronic anemia; Cutaneous polyarteritis nodosa (HCC); Dry gangrene (HCC); Infection of wound due to methicillin resistant Staphylococcus aureus (MRSA); Open wound of both lower extremities with complication; PE  (pulmonary thromboembolism) (HCC); Vasculitis (HCC); Pharmacologic therapy; Disorder of skeletal system; Problems influencing health status; Chronic pain syndrome; Chronic lower extremity pain (1ry area of Pain) (Bilateral) (R>L); Chronic knee pain (2ry area of Pain) (Left); Chronic shoulder pain (3ry area of Pain) (Right); Chronic anticoagulation (Eliquis ); Venous stasis ulcers of lower extremities (HCC) (Bilateral); Peripheral vascular disease (HCC); and Phantom limb syndrome with pain (HCC) (Right) on their problem list. Today she comes in for evaluation of her Leg Pain (Bilateral, usually below the knee, sometimes above the knee)  Pain Assessment: Location: Right, Left, Lower Leg Radiating:   Onset: More than a month ago Duration: Chronic pain, Phantom pain Quality: Sharp, Burning Severity: 8 /10 (subjective, self-reported pain score)  Effect on ADL: difficulty performing daily activities Timing: Constant Modifying factors: nothing BP: (!) 140/92  HR: 79  Onset and Duration: Sudden, Gradual, and Present longer than 3 months Cause of pain: Surgery Severity: NAS-11 at its worse: 10/10, NAS-11 at its best: 5/10, NAS-11 now: 8/10, and NAS-11 on the average: 8/10 Timing: Afternoon, Night, During activity or exercise, and After activity or exercise Aggravating Factors: Kneeling, Prolonged sitting, Prolonged standing, Walking, Walking uphill, Walking downhill, and Working Alleviating Factors: Medications Associated Problems: Inability to concentrate, Numbness, Sadness, Spasms, Swelling, Tingling, Weakness, Pain that wakes patient up, and Pain that does not allow patient to sleep Quality of Pain: Aching, Annoying, Constant, Cruel, Deep, Dreadful, Exhausting, Feeling of weight, Getting longer, Horrible, Itching, Sharp, Shooting, Stabbing, Superficial, Throbbing, and Uncomfortable Previous Examinations or Tests: CT scan and EMG/PNCV Previous Treatments: Narcotic medications, Steroid treatments by  mouth, and Trigger point injections  Regina Carpenter is being evaluated for possible interventional pain management therapies for the treatment of her chronic pain.  Discussed the use of AI scribe software for clinical note transcription with the patient, who gave verbal consent to proceed.  History of Present  Illness   Regina Carpenter is a 48 year old female with a history of vein problems who presents for pain management evaluation. She was referred by her primary doctor for pain management as the clinic does not prescribe narcotics.  She experiences constant pain primarily in her legs, from the knees down, with the right leg being worse than the left. The pain radiates from the knee down to the foot and sometimes up the thigh. 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 Phantom pain is present in the amputated toes of the right foot. The pain has been ongoing for about five years, starting before her surgeries.  She has scars from skin ulcers on her legs and does not currently have a vascular surgeon. For pain management, she takes gabapentin  300 mg three times a day, sometimes increasing the dose depending on pain severity, and oxycodone . She also takes Tylenol , Eliquis , and prednisone  in the morning due to difficulty walking upon waking.  In addition to leg pain, she experiences pain in her left knee and right shoulder. The left knee pain is located in the front, below the patella, affecting both the medial and lateral aspects. She received a cortisone injection from a rheumatologist, which provided temporary relief. No surgeries or physical therapy for the knee. The right shoulder pain was also treated with an injection, which helped initially but the pain returned after a month. No surgery or physical therapy for the shoulder.  Her pain is worse at night, affecting her sleep, and she describes her legs  as becoming 'irritable' when lying down. She has difficulty sleeping and often kicks and sits up due to discomfort.  No diabetes but confirms issues with blood flow related to her veins. No side effects from gabapentin  and confirms that her pain is worse at night.      Regina Carpenter has been informed that this initial visit was an evaluation only.  On the follow up appointment I will go over the results, including ordered tests and available interventional therapies. At that time she will have the opportunity to decide whether to proceed with offered therapies or not. In the event that Regina Carpenter prefers avoiding interventional options, this will conclude our involvement in the case.  Medication management recommendations may be provided upon request.  Patient informed that diagnostic tests may be ordered to assist in identifying underlying causes, narrow the list of differential diagnoses and aid in determining candidacy for (or contraindications to) planned therapeutic interventions.  Historic Controlled Substance Pharmacotherapy Review PMP and historical list of controlled substances: Oxycodone  IR 5 mg tablet, 1 tab p.o. 4 times daily (# 15) (last filled on 02/20/2024); gabapentin  300 mg capsule, 1 cap p.o. 3 times daily (90/month) (last filled on 02/17/2024); oxycodone /APAP 5/325 (# 15) (last filled on 02/02/2024); pregabalin 50 mg capsule, 1 cap p.o. twice daily (60/month) (last filled on 01/16/2024)   Most recently prescribed controlled substance(s): Opioid Analgesic: Oxycodone  IR 5 mg tablet, 1 tab p.o. 4 times daily (# 15) (last filled on 02/20/2024)  MME/day: 28.13 mg/day  Historical Monitoring: The patient  reports no history of drug use. List of prior UDS Testing: Lab Results  Component Value Date   MDMA NONE DETECTED 05/19/2020   MDMA NONE DETECTED 11/23/2016   COCAINSCRNUR NONE DETECTED 05/19/2020   COCAINSCRNUR NONE DETECTED 11/23/2016   PCPSCRNUR NONE DETECTED 05/19/2020   PCPSCRNUR  NONE  DETECTED 11/23/2016   THCU NONE DETECTED 05/19/2020   THCU POSITIVE (A) 11/23/2016   Historical Background Evaluation: [place] PMP: PDMP reviewed during this encounter. Review of the past 18-months conducted.             PMP NARX Score Report:  Narcotic: 400 Sedative: 270 Stimulant: 000 [place] Department of public safety, offender search: Engineer, mining Information) Non-contributory Risk Assessment Profile: Aberrant behavior: None observed or detected today Risk factors for fatal opioid overdose: None identified today PMP NARX Overdose Risk Score: 350 Fatal overdose hazard ratio (HR): Calculation deferred Non-fatal overdose hazard ratio (HR): Calculation deferred Risk of opioid abuse or dependence: 0.7-3.0% with doses <= 36 MME/day and 6.1-26% with doses >= 120 MME/day. Substance use disorder (SUD) risk level: See below Personal History of Substance Abuse (SUD-Substance use disorder):  Alcohol:    Illegal Drugs:    Rx Drugs:    ORT Risk Level calculation:    ORT Scoring interpretation table:  Score <3 = Low Risk for SUD  Score between 4-7 = Moderate Risk for SUD  Score >8 = High Risk for Opioid Abuse   PHQ-2 Depression Scale:  Total score: 4  PHQ-2 Scoring interpretation table: (Score and probability of major depressive disorder)  Score 0 = No depression  Score 1 = 15.4% Probability  Score 2 = 21.1% Probability  Score 3 = 38.4% Probability  Score 4 = 45.5% Probability  Score 5 = 56.4% Probability  Score 6 = 78.6% Probability   PHQ-9 Depression Scale:  Total score: 4  PHQ-9 Scoring interpretation table:  Score 0-4 = No depression  Score 5-9 = Mild depression  Score 10-14 = Moderate depression  Score 15-19 = Moderately severe depression  Score 20-27 = Severe depression (2.4 times higher risk of SUD and 2.89 times higher risk of overuse)   Pharmacologic Plan: As per protocol, I have not taken over any controlled substance management, pending the results of ordered tests and/or  consults.            Initial impression: Pending review of available data and ordered tests.  Meds   Current Outpatient Medications:    acetaminophen  (TYLENOL ) 500 MG tablet, Take 1,000 mg by mouth., Disp: , Rfl:    colchicine 0.6 MG tablet, Take 0.6 mg by mouth daily., Disp: , Rfl:    ELIQUIS  5 MG TABS tablet, Take 5 mg by mouth 2 (two) times daily., Disp: , Rfl:    gabapentin  (NEURONTIN ) 600 MG tablet, Take 0.5 tablets (300 mg total) by mouth 3 (three) times daily., Disp: 45 tablet, Rfl: 0   hydroxychloroquine (PLAQUENIL) 200 MG tablet, Take 200 mg by mouth 2 (two) times daily., Disp: , Rfl:    methotrexate  (RHEUMATREX) 2.5 MG tablet, Take 20 mg by mouth once a week., Disp: , Rfl:    saccharomyces boulardii (FLORASTOR) 250 MG capsule, Take 1 capsule (250 mg total) by mouth 2 (two) times daily., Disp: 60 capsule, Rfl: 0   venlafaxine XR (EFFEXOR-XR) 75 MG 24 hr capsule, Take 75 mg by mouth daily., Disp: , Rfl:   Imaging Review  Cervical Imaging: Cervical MR w/wo contrast: Results for orders placed during the hospital encounter of 11/23/16 MR Cervical Spine W or Wo Contrast  Narrative CLINICAL DATA:  48 y/o F; neck pain with concern for mass or infection.  EXAM: MRI CERVICAL SPINE WITHOUT AND WITH CONTRAST  TECHNIQUE: Multiplanar and multiecho pulse sequences of the cervical spine, to include the craniocervical junction and cervicothoracic junction, were  obtained without and with intravenous contrast.  CONTRAST:  15mL MULTIHANCE  GADOBENATE DIMEGLUMINE  529 MG/ML IV SOLN  COMPARISON:  None.  FINDINGS: Alignment: Straightening of cervical lordosis with slight reversal at the C5-6 level.  Vertebrae: No evidence of acute fracture or discitis. There is edema and enhancement within the right C5-6 facet (series 6, image 1 and series 11, image 17).  Cord: No abnormal cord signal or cord enhancement. No epidural collection.  Posterior Fossa, vertebral arteries, paraspinal  tissues: Mild enhancement and edema within the posterior paraspinal muscles of the neck in the suboccipital region and deep to the nuchal ligament from C2-C4. No discrete fluid collection.  Disc levels:  C2-3: No significant disc displacement, foraminal narrowing, or canal stenosis.  C3-4: No significant disc displacement, foraminal narrowing, or canal stenosis.  C4-5: No significant disc displacement, foraminal narrowing, or canal stenosis.  C5-6: Central disc protrusion with mild anterior cord impingement and flattening. Bilateral uncovertebral and facet hypertrophy with mild foraminal narrowing. No significant canal stenosis.  C6-7: Minimal disc bulge. No significant foraminal narrowing or canal stenosis.  C7-T1: No significant disc displacement, foraminal narrowing, or canal stenosis.  IMPRESSION: 1. Mild enhancement and edema within the posterior paraspinal muscles in the suboccipital region and deep to the nuchal ligament from C2 through C4. Findings may represents traumatic, infectious, or inflammatory myositis. No discrete fluid collection is identified. 2. Mild edema and enhancement within the right C5-6 facet is probably degenerative. 3. No evidence for discitis, epidural collection, or mass. 4. Mild cervical spondylosis predominantly at the C5-6 level where a small central disc protrusion impinges the anterior cord with flattening. No high-grade canal stenosis or cord compression.   Electronically Signed By: Dorlene Gary M.D. On: 11/23/2016 15:51  Cervical CT wo contrast: Results for orders placed during the hospital encounter of 09/15/23 CT Cervical Spine Wo Contrast  Narrative CLINICAL DATA:  Provided history: Neck pain, acute, no red flags. Immunosuppressed.  EXAM: CT CERVICAL SPINE WITHOUT CONTRAST  TECHNIQUE: Multidetector CT imaging of the cervical spine was performed without intravenous contrast. Multiplanar CT image  reconstructions were also generated.  RADIATION DOSE REDUCTION: This exam was performed according to the departmental dose-optimization program which includes automated exposure control, adjustment of the mA and/or kV according to patient size and/or use of iterative reconstruction technique.  COMPARISON:  Cervical spine CT 09/10/2022.  FINDINGS: Alignment: Nonspecific reversal of the expected cervical lordosis. No significant spondylolisthesis.  Skull base and vertebrae: The basion-dental and atlanto-dental intervals are maintained. No evidence of a cervical spine fracture.  Soft tissues and spinal canal: No prevertebral soft tissue swelling. No paraspinal mass or collection.  Disc levels:  Mild disc space narrowing at C5-C6 and C6-C7.  C2-C3: Slight disc bulge. No significant spinal canal stenosis is appreciated. No significant bony neural foraminal narrowing.  C3-C4: Slight disc bulge. No significant spinal canal stenosis is appreciated. No significant bony neural foraminal narrowing.  C4-C5: Slight disc bulge. No significant spinal canal stenosis is appreciated. No significant bony neural foraminal narrowing.  C5-C6: Slight disc bulge. Minimal endplate spurring and bilateral uncovertebral hypertrophy. No significant spinal canal stenosis is appreciated. No significant bony neural foraminal narrowing.  C6-C7: Slight disc bulge. Minimal endplate spurring and uncovertebral hypertrophy. No significant spinal canal stenosis is appreciated. No significant bony neural foraminal narrowing.  C7-T1: Beam hardening artifact limits evaluation of the spinal canal this level. Within this limitation, no disc herniation or significant spinal canal stenosis is appreciated. No significant bony neural foraminal narrowing.  Upper chest:  No consolidation within the imaged lung apices.  IMPRESSION: 1. Beam hardening artifact limits evaluation of the spinal canal at the C7-T1 level.  Within this limitation, findings are as follows. 2. Mild cervical spondylosis as outlined. No significant spinal canal stenosis is appreciated. No significant bony neural foraminal narrowing. 3. No non-contrast CT evidence of discitis/osteomyelitis. However please note, a cervical spine MRI (with and without contrast) would have greater sensitivity.   Electronically Signed By: Bascom Lily D.O. On: 09/15/2023 10:02  Cervical DG 2-3 views: Results for orders placed during the hospital encounter of 08/06/19 DG Cervical Spine 2-3 Views  Narrative CLINICAL DATA:  Neck and left shoulder pain following a fall down steps yesterday.  EXAM: CERVICAL SPINE - 2-3 VIEW  COMPARISON:  Cervical spine MR dated 11/23/2016  FINDINGS: Again demonstrated is mild reversal of the normal cervical lordosis at the C5-6 level. No prevertebral soft tissue swelling, fracture or subluxation seen.  IMPRESSION: Stable mild reversal of the normal cervical lordosis. No fracture or subluxation.   Electronically Signed By: Catherin Closs M.D. On: 08/06/2019 09:12  Shoulder Imaging: Shoulder-R DG: Results for orders placed during the hospital encounter of 09/15/23 DG Shoulder Right  Narrative CLINICAL DATA:  Shoulder/neck pain.  No known injury.  EXAM: RIGHT SHOULDER - 2+ VIEW  COMPARISON:  12/16/2019.  FINDINGS: No acute fracture or dislocation.  No aggressive osseous lesion.  Glenohumeral and acromioclavicular joints are normal in alignment.  Minimal osteoarthritis of the acromioclavicular joint. No significant arthritis of glenohumeral joint.  No soft tissue swelling.  No radiopaque foreign bodies.  IMPRESSION: *No acute osseous abnormality of the right shoulder joint. Minimal acromioclavicular joint degenerative osteoarthritis.   Electronically Signed By: Beula Brunswick M.D. On: 09/15/2023 09:39  Shoulder-L DG: Results for orders placed during the hospital encounter of  09/25/23 DG Shoulder Left  Narrative CLINICAL DATA:  Left shoulder pain x3 weeks.  EXAM: LEFT SHOULDER - 2+ VIEW  COMPARISON:  August 06, 2019  FINDINGS: There is no evidence of fracture or dislocation. There is no evidence of arthropathy or other focal bone abnormality. Soft tissues are unremarkable.  IMPRESSION: Negative.   Electronically Signed By: Virgle Grime M.D. On: 09/25/2023 02:18  Knee Imaging: Knee-L DG 1-2 views: Results for orders placed during the hospital encounter of 01/18/24 DG Knee 2 Views Left  Narrative CLINICAL DATA:  Left knee pain, no known injury, initial encounter  EXAM: LEFT KNEE - 1-2 VIEW  COMPARISON:  08/14/2022  FINDINGS: No evidence of fracture, dislocation, or joint effusion. No evidence of arthropathy or other focal bone abnormality. Soft tissues are unremarkable.  IMPRESSION: No acute abnormality noted.   Electronically Signed By: Violeta Grey M.D. On: 01/18/2024 03:39  Knee-L DG 4 views: Results for orders placed during the hospital encounter of 08/14/22 DG Knee Complete 4 Views Left  Narrative CLINICAL DATA:  Pain starting 3 weeks ago. No trauma history is submitted.  EXAM: LEFT KNEE - COMPLETE 4+ VIEW  COMPARISON:  None Available.  FINDINGS: No acute fracture or dislocation. No joint effusion. There may be mild medial compartment joint space narrowing.  IMPRESSION: No acute osseous abnormality.   Electronically Signed By: Lore Rode M.D. On: 08/14/2022 11:31  Foot Imaging: Foot-R DG Complete: Results for orders placed during the hospital encounter of 07/17/20 DG Foot Complete Right  Narrative CLINICAL DATA:  Cellulitis.  Concern for osteomyelitis.  EXAM: RIGHT FOOT COMPLETE - 3+ VIEW  COMPARISON:  Right foot radiographs 05/16/2020  FINDINGS: Forefoot amputation  at the level of the metatarsals is noted. Soft tissue swelling is present anterior to the osteotomy sites.  Residual metatarsals are within normal limits. No osseous erosion is present. No gas is present in the soft tissues. Midfoot and hindfoot are within normal limits.  IMPRESSION: 1. Forefoot amputation without radiographic evidence for osteomyelitis. 2. Soft tissue swelling anterior to the osteotomy sites.   Electronically Signed By: Audree Leas M.D. On: 07/17/2020 06:13  Foot-L DG Complete: Results for orders placed during the hospital encounter of 07/17/20 DG Foot Complete Left  Narrative CLINICAL DATA:  Cellulitis, concern for osteomyelitis  EXAM: LEFT FOOT - COMPLETE 3+ VIEW  COMPARISON:  None.  FINDINGS: No fracture or dislocation is seen.  The joint spaces are preserved.  The visualized soft tissues are unremarkable.  No osseous destruction to suggest osteomyelitis.  IMPRESSION: No radiographic findings to suggest osteomyelitis.   Electronically Signed By: Zadie Herter M.D. On: 07/17/2020 06:13  Complexity Note: Imaging results reviewed.                         ROS  Cardiovascular: Blood thinners:  Anticoagulant and Needs antibiotics prior to dental procedures Pulmonary or Respiratory: Smoking, Snoring , and Coughing up mucus (Bronchitis) Neurological: No reported neurological signs or symptoms such as seizures, abnormal skin sensations, urinary and/or fecal incontinence, being born with an abnormal open spine and/or a tethered spinal cord Psychological-Psychiatric: No reported psychological or psychiatric signs or symptoms such as difficulty sleeping, anxiety, depression, delusions or hallucinations (schizophrenial), mood swings (bipolar disorders) or suicidal ideations or attempts Gastrointestinal: Vomiting blood (Ulcers) Genitourinary: No reported renal or genitourinary signs or symptoms such as difficulty voiding or producing urine, peeing blood, non-functioning kidney, kidney stones, difficulty emptying the bladder, difficulty controlling the  flow of urine, or chronic kidney disease Hematological: Brusing easily Endocrine: No reported endocrine signs or symptoms such as high or low blood sugar, rapid heart rate due to high thyroid levels, obesity or weight gain due to slow thyroid or thyroid disease Rheumatologic: Rheumatoid arthritis Musculoskeletal: Negative for myasthenia gravis, muscular dystrophy, multiple sclerosis or malignant hyperthermia Work History: Out of work due to pain  Allergies  Regina Carpenter is allergic to ibuprofen, sulfa  antibiotics, and sulfasalazine.  Laboratory Chemistry Profile   Renal Lab Results  Component Value Date   BUN 17 01/20/2024   CREATININE 0.78 01/20/2024   GFRAA >60 05/23/2020   GFRNONAA >60 01/20/2024   PROTEINUR NEGATIVE 03/12/2023     Electrolytes Lab Results  Component Value Date   NA 139 01/20/2024   K 4.2 01/20/2024   CL 102 01/20/2024   CALCIUM 9.3 01/20/2024   MG 2.3 09/25/2023     Hepatic Lab Results  Component Value Date   AST 17 01/20/2024   ALT 13 01/20/2024   ALBUMIN 3.8 01/20/2024   ALKPHOS 73 01/20/2024   LIPASE 24 11/09/2023     ID Lab Results  Component Value Date   HIV Non Reactive 05/16/2020   SARSCOV2NAA NEGATIVE 11/09/2023   STAPHAUREUS POSITIVE (A) 05/19/2020   MRSAPCR NEGATIVE 07/18/2020   PREGTESTUR Negative 11/10/2023     Bone No results found for: VD25OH, [id number], AO1308MV7, QI6962XB2, 25OHVITD1, 25OHVITD2, 25OHVITD3, TESTOFREE, TESTOSTERONE   Endocrine Lab Results  Component Value Date   GLUCOSE 110 (H) 01/20/2024   GLUCOSEU NEGATIVE 03/12/2023   HGBA1C 5.4 11/14/2016   TSH 0.843 11/23/2016     Neuropathy Lab Results  Component Value Date   HGBA1C 5.4 11/14/2016  HIV Non Reactive 05/16/2020     CNS No results found for: COLORCSF, APPEARCSF, RBCCOUNTCSF, WBCCSF, POLYSCSF, LYMPHSCSF, EOSCSF, PROTEINCSF, GLUCCSF, JCVIRUS, CSFOLI, IGGCSF, LABACHR, ACETBL   Inflammation (CRP: Acute   ESR: Chronic) Lab Results  Component Value Date   CRP 3.1 (H) 07/21/2020   ESRSEDRATE 27 (H) 07/17/2020   LATICACIDVEN 1.7 07/17/2020     Rheumatology Lab Results  Component Value Date   RF 19.2 (H) 11/23/2016   LABURIC 2.4 11/24/2016     Coagulation Lab Results  Component Value Date   INR 1.1 07/18/2020   LABPROT 13.6 07/18/2020   PLT 257 01/20/2024     Cardiovascular Lab Results  Component Value Date   BNP 15.5 01/06/2024   CKTOTAL 79 03/11/2023   HGB 12.7 01/20/2024   HCT 38.9 01/20/2024     Screening Lab Results  Component Value Date   SARSCOV2NAA NEGATIVE 11/09/2023   COVIDSOURCE NASOPHARYNGEAL 06/23/2019   STAPHAUREUS POSITIVE (A) 05/19/2020   MRSAPCR NEGATIVE 07/18/2020   HIV Non Reactive 05/16/2020   PREGTESTUR Negative 11/10/2023     Cancer No results found for: CEA, CA125, LABCA2   Allergens No results found for: ALMOND, APPLE, ASPARAGUS, AVOCADO, BANANA, BARLEY, BASIL, BAYLEAF, GREENBEAN, LIMABEAN, WHITEBEAN, BEEFIGE, REDBEET, BLUEBERRY, BROCCOLI, CABBAGE, MELON, CARROT, CASEIN, CASHEWNUT, CAULIFLOWER, CELERY     Note: Lab results reviewed.  PFSH  Drug: Regina Carpenter  reports no history of drug use. Alcohol:  reports no history of alcohol use. Tobacco:  reports that she has been smoking cigarettes. She has never used smokeless tobacco. Medical:  has a past medical history of Arthralgia of right knee, Arthritis, Cellulitis of left leg, Collagen vascular disease (HCC), Lymphadenopathy, mesenteric, Right hand pain, and Tobacco abuse. Family: family history includes Alcohol abuse in her father; Leukemia in her maternal grandfather.  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   Procedure: TRANSMETATARSAL AMPUTATION;  Surgeon: Angel Barba, DPM;  Location: [facility];  Service: Podiatry;  Laterality: Right;   Active Ambulatory Problems    Diagnosis Date Noted   Abdominal pain 11/14/2016   Lymphadenopathy, mesenteric 06/10/2020   Arthralgia of right knee 04/02/2015   Cellulitis of left leg 03/21/2015   Right hand pain 03/21/2015   Tobacco use 03/21/2015   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   Hyponatremia 11/25/2016   Hypokalemia 11/25/2016   Leukocytosis 11/25/2016   Generalized weakness 11/25/2016   Generalized pain 11/25/2016   Conjunctivitis 11/25/2016   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   Sepsis (HCC) 05/16/2020   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) 05/17/2020   Rheumatoid arthritis (HCC) 05/17/2020   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   Essential hypertension 05/17/2020   Smoking 05/17/2020   Bilateral lower leg cellulitis 03/21/2015   Chronic anemia 08/12/2020   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   Infection of wound due to methicillin resistant Staphylococcus aureus (MRSA) 04/30/2021   Open wound of both lower extremities with complication 08/12/2020   PE (pulmonary thromboembolism) (HCC) 08/13/2020   Vasculitis (HCC) 05/09/2020   Pharmacologic therapy 02/28/2024   Disorder of skeletal system 02/28/2024   Problems influencing health status 02/28/2024   Chronic pain syndrome 02/28/2024   Chronic lower extremity pain (1ry area of Pain) (  Bilateral) (R>L) 03/14/2024   Chronic knee pain (2ry area of Pain) (Left) 03/14/2024   Chronic shoulder pain (3ry area of Pain) (Right) 03/14/2024   Chronic anticoagulation (Eliquis ) 03/14/2024   Venous stasis ulcers of lower extremities (HCC) (Bilateral) 03/14/2024   Peripheral vascular disease (HCC) 03/14/2024   Phantom limb syndrome with pain (HCC) (Right) 03/14/2024   Resolved Ambulatory  Problems    Diagnosis Date Noted   No Resolved Ambulatory Problems   Past Medical History:  Diagnosis Date   Arthritis    Collagen vascular disease (HCC)    Tobacco abuse    Constitutional Exam  General appearance: Well nourished, well developed, and well hydrated. In no apparent acute distress Vitals:   03/14/24 0952  BP: (!) 140/92  Pulse: 79  Resp: 16  Temp: (!) 97.1 F (36.2 C)  TempSrc: Temporal  SpO2: 96%  Weight: 260 lb (117.9 kg)  Height: 5' 5 (1.651 m)   BMI Assessment: Estimated body mass index is 43.27 kg/m as calculated from the following:   Height as of this encounter: 5' 5 (1.651 m).   Weight as of this encounter: 260 lb (117.9 kg).  BMI interpretation table: BMI level Category Range association with higher incidence of chronic pain  <18 kg/m2 Underweight   18.5-24.9 kg/m2 Ideal body weight   25-29.9 kg/m2 Overweight Increased incidence by 20%  30-34.9 kg/m2 Obese (Class I) Increased incidence by 68%  35-39.9 kg/m2 Severe obesity (Class II) Increased incidence by 136%  >40 kg/m2 Extreme obesity (Class III) Increased incidence by 254%   Patient's current BMI Ideal Body weight  Body mass index is 43.27 kg/m. Ideal body weight: 57 kg (125 lb 10.6 oz) Adjusted ideal body weight: 81.4 kg (179 lb 6.4 oz)   BMI Readings from Last 4 Encounters:  03/14/24 43.27 kg/m  02/20/24 43.27 kg/m  01/20/24 42.43 kg/m  01/18/24 41.60 kg/m   Wt Readings from Last 4 Encounters:  03/14/24 260 lb (117.9 kg)  02/20/24 260 lb (117.9 kg)  01/20/24 255 lb (115.7 kg)  01/18/24 250 lb (113.4 kg)    Psych/Mental status: Alert, oriented x 3 (person, place, & time)       Eyes: PERLA Respiratory: No evidence of acute respiratory distress  Assessment  Primary Diagnosis & Pertinent Problem List: The primary encounter diagnosis was Chronic pain syndrome. Diagnoses of Pharmacologic therapy, Disorder of skeletal system, Problems influencing health status, Chronic lower  extremity pain (1ry area of Pain) (Bilateral) (R>L), Chronic knee pain (2ry area of Pain) (Left), Chronic shoulder pain (3ry area of Pain) (Right), Chronic anticoagulation (Eliquis ), Venous stasis ulcers of lower extremities (HCC) (Bilateral), Peripheral vascular disease (HCC), and Phantom limb syndrome with pain (HCC) (Right) were also pertinent to this visit.  Visit Diagnosis (New problems to examiner): 1. Chronic pain syndrome   2. Pharmacologic therapy   3. Disorder of skeletal system   4. Problems influencing health status   5. Chronic lower extremity pain (1ry area of Pain) (Bilateral) (R>L)   6. Chronic knee pain (2ry area of Pain) (Left)   7. Chronic shoulder pain (3ry area of Pain) (Right)   8. Chronic anticoagulation (Eliquis )   9. Venous stasis ulcers of lower extremities (HCC) (Bilateral)   10. Peripheral vascular disease (HCC)   11. Phantom limb syndrome with pain (HCC) (Right)    Plan of Care (Initial workup plan)  Note: Regina Carpenter was reminded that as per protocol, today's visit has been an evaluation only. We have not taken over the patient's  controlled substance management.  Problem-specific plan: Assessment and Plan    Chronic venous insufficiency with stasis ulcers   She experiences chronic venous insufficiency with stasis ulcers, primarily affecting the right leg, causing pain from mid-calf to foot. Scars from previous ulcers are present on both medial and lateral aspects of the distal leg. The left leg is also affected but less severely. Currently, no vascular surgeon is overseeing her care. The stasis ulcers result from poor blood flow and inadequate oxygenation, leading to skin necrosis. Refer to a vascular surgeon for ongoing management.  Phantom limb pain in right foot   She suffers from phantom limb pain in all toes of the right foot following amputation due to chronic pain and skin ulcers. This pain likely stems from permanent changes in nerve conduction from  prolonged pre-amputation pain. Gabapentin  is prescribed for pain management, emphasizing consistent use to maintain therapeutic blood levels. Educate her on the importance of consistent gabapentin  use and advise taking it at bedtime to aid sleep and manage nighttime pain.  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 All toes on the right foot and two toes on the left foot have been amputated. No phantom pain is reported in the left foot.  Pain in left knee   She experiences chronic pain in the left knee, primarily in the anterior aspect below the patella, affecting both medial and lateral areas. A previous cortisone injection provided temporary relief. There is no history of surgery or physical therapy, and no recent imaging is available. Review the previous MRI [facility] and consider physical therapy for knee pain management.  Pain in right shoulder   She has chronic pain in the right shoulder with a history of arthritis. A previous injection provided temporary relief. There is no history of surgery or physical therapy, and no recent imaging is available. Further evaluation is needed to determine appropriate management strategies. Review previous imaging studies to assess the current condition.       Lab Orders         Compliance Drug Analysis, Ur         Vitamin B12         Sedimentation rate         25-Hydroxy vitamin D Lcms D2+D3         C-reactive protein     Imaging Orders         MR SHOULDER RIGHT WO CONTRAST         MR KNEE LEFT WO CONTRAST     Referral Orders  No referral(s) requested today   Procedure Orders    No procedure(s) ordered today   Pharmacotherapy (current): Medications ordered:  No orders of the defined types were placed in this encounter.  Medications administered during this visit: Regina Carpenter had no medications administered during this visit.   Analgesic Pharmacotherapy:  Opioid  Analgesics: For patients currently taking or requesting to take opioid analgesics, in accordance with [place]  Medical Board Guidelines, we will assess their risks and indications for the use of these substances. After completing our evaluation, we may offer recommendations, but we no longer take patients for medication management. The prescribing physician will ultimately decide, based on his/her training and level of comfort whether to adopt any of the recommendations, including whether or not to prescribe such medicines.  Membrane stabilizer: To be determined at a later time  Muscle relaxant: To be determined at a  later time  NSAID: To be determined at a later time  Other analgesic(s): To be determined at a later time   Interventional management options: Regina Carpenter was informed that there is no guarantee that she would be a candidate for interventional therapies. The decision will be based on the results of diagnostic studies, as well as Regina Carpenter's risk profile.  Procedure(s) under consideration:  Pending results of ordered studies     Interventional Therapies  Risk Factors  Considerations  Medical Comorbidities:  Eliquis  Anticoagulation: (Stop: 3 days  Restart: 6 hours)    Planned  Pending:   Review of available imaging studies for the left knee and right shoulder.    Under consideration:   Diagnostic bilateral lumbar sympathetic block #1  Diagnostic bilateral lumbar spinal cord stimulator trial #1  Diagnostic/therapeutic left genicular nerve block #1 with possible follow-up radiofrequency ablation    Completed: (Analgesic benefit)1  None at this time   Therapeutic  Palliative (PRN) options:   None established   Completed by other providers:   None reported  1(Analgesic benefit): Expressed in percentage (%). (Local anesthetic[LA] +/- sedation  L.A.Local Anesthetic  Steroid benefit  Ongoing benefit)   Provider-requested follow-up: Return in about 2 weeks (around  03/28/2024) for ( ), Eval-day (M,W), (F2F), 2nd Visit, for review of ordered tests.  No future appointments.  I discussed the assessment and treatment plan with the patient. The patient was provided an opportunity to ask questions and all were answered. The patient agreed with the plan and demonstrated an understanding of the instructions.  Patient advised to call back or seek an in-person evaluation if the symptoms or condition worsens.  Duration of encounter: 60 minutes.  Total time on encounter, as per AMA guidelines included both the face-to-face and non-face-to-face time personally spent by the physician and/or other qualified health care professional(s) on the day of the encounter (includes time in activities that require the physician or other qualified health care professional and does not include time in activities normally performed by clinical staff). Physician's time may include the following activities when performed: Preparing to see the patient (e.g., pre-charting review of records, searching for previously ordered imaging, lab work, and nerve conduction tests) Review of prior analgesic pharmacotherapies. Reviewing PMP Interpreting ordered tests (e.g., lab work, imaging, nerve conduction tests) Performing post-procedure evaluations, including interpretation of diagnostic procedures Obtaining and/or reviewing separately obtained history Performing a medically appropriate examination and/or evaluation Counseling and educating the patient/family/caregiver Ordering medications, tests, or procedures Referring and communicating with other health care professionals (when not separately reported) Documenting clinical information in the electronic or other health record Independently interpreting results (not separately reported) and communicating results to the patient/ family/caregiver Care coordination (not separately reported)  Note by: Candi Chafe, MD (TTS and AI  technology used. I apologize for any typographical errors that were not detected and corrected.) Date: 03/14/2024; Time: 12:45 PM

## 2024-03-14 ENCOUNTER — Ambulatory Visit: Attending: Pain Medicine | Admitting: Pain Medicine

## 2024-03-14 ENCOUNTER — Telehealth: Payer: Self-pay

## 2024-03-14 ENCOUNTER — Encounter: Payer: Self-pay | Admitting: Pain Medicine

## 2024-03-14 VITALS — BP 140/92 | HR 79 | Temp 97.1°F | Resp 16 | Ht 65.0 in | Wt 260.0 lb

## 2024-03-14 DIAGNOSIS — I739 Peripheral vascular disease, unspecified: Secondary | ICD-10-CM | POA: Diagnosis not present

## 2024-03-14 DIAGNOSIS — Z7901 Long term (current) use of anticoagulants: Secondary | ICD-10-CM | POA: Diagnosis not present

## 2024-03-14 DIAGNOSIS — Z89421 Acquired absence of other right toe(s): Secondary | ICD-10-CM | POA: Insufficient documentation

## 2024-03-14 DIAGNOSIS — M79672 Pain in left foot: Secondary | ICD-10-CM | POA: Diagnosis not present

## 2024-03-14 DIAGNOSIS — L97919 Non-pressure chronic ulcer of unspecified part of right lower leg with unspecified severity: Secondary | ICD-10-CM | POA: Insufficient documentation

## 2024-03-14 DIAGNOSIS — M25562 Pain in left knee: Secondary | ICD-10-CM | POA: Insufficient documentation

## 2024-03-14 DIAGNOSIS — M25511 Pain in right shoulder: Secondary | ICD-10-CM | POA: Insufficient documentation

## 2024-03-14 DIAGNOSIS — M79605 Pain in left leg: Secondary | ICD-10-CM | POA: Diagnosis not present

## 2024-03-14 DIAGNOSIS — M899 Disorder of bone, unspecified: Secondary | ICD-10-CM | POA: Diagnosis present

## 2024-03-14 DIAGNOSIS — Z79899 Other long term (current) drug therapy: Secondary | ICD-10-CM | POA: Insufficient documentation

## 2024-03-14 DIAGNOSIS — G546 Phantom limb syndrome with pain: Secondary | ICD-10-CM | POA: Diagnosis not present

## 2024-03-14 DIAGNOSIS — Z89422 Acquired absence of other left toe(s): Secondary | ICD-10-CM | POA: Insufficient documentation

## 2024-03-14 DIAGNOSIS — M79604 Pain in right leg: Secondary | ICD-10-CM | POA: Insufficient documentation

## 2024-03-14 DIAGNOSIS — I83029 Varicose veins of left lower extremity with ulcer of unspecified site: Secondary | ICD-10-CM | POA: Diagnosis not present

## 2024-03-14 DIAGNOSIS — G894 Chronic pain syndrome: Secondary | ICD-10-CM | POA: Diagnosis present

## 2024-03-14 DIAGNOSIS — G8929 Other chronic pain: Secondary | ICD-10-CM | POA: Insufficient documentation

## 2024-03-14 DIAGNOSIS — Z789 Other specified health status: Secondary | ICD-10-CM | POA: Insufficient documentation

## 2024-03-14 DIAGNOSIS — L97929 Non-pressure chronic ulcer of unspecified part of left lower leg with unspecified severity: Secondary | ICD-10-CM | POA: Diagnosis not present

## 2024-03-14 DIAGNOSIS — I83019 Varicose veins of right lower extremity with ulcer of unspecified site: Secondary | ICD-10-CM | POA: Insufficient documentation

## 2024-03-14 DIAGNOSIS — M79671 Pain in right foot: Secondary | ICD-10-CM | POA: Diagnosis not present

## 2024-03-14 DIAGNOSIS — Z79891 Long term (current) use of opiate analgesic: Secondary | ICD-10-CM | POA: Insufficient documentation

## 2024-03-19 LAB — COMPLIANCE DRUG ANALYSIS, UR

## 2024-03-21 LAB — C-REACTIVE PROTEIN: CRP: 7 mg/L (ref 0–10)

## 2024-03-21 LAB — 25-HYDROXY VITAMIN D LCMS D2+D3
25-Hydroxy, Vitamin D-2: <1 ng/mL
25-Hydroxy, Vitamin D-3: 39 ng/mL
25-Hydroxy, Vitamin D: 39 ng/mL

## 2024-03-21 LAB — SEDIMENTATION RATE: Sed Rate: 30 mm/h (ref 0–32)

## 2024-03-21 LAB — VITAMIN B12: Vitamin B-12: 219 pg/mL — ABNORMAL LOW (ref 232–1245)

## 2024-03-22 ENCOUNTER — Telehealth: Payer: Self-pay

## 2024-03-22 NOTE — Telephone Encounter (Signed)
 Insurance Treatment Denial Note  Date order was entered:  Order entered by: Renaldo Caroli, MD,Seema Lydia Sams, NP Requested treatment: MRI shoulder, knee Reason for denial: Documentation of Physical Therapy is missing. Recommended for approval: see below   Your doctor told us  that you have persistent pain at the front and center of the knee. Your doctor ordered an MRI of your knee. An MRI is a way to take pictures of the inside of your body. This test should be used when all the following criteria are met: (1) Your knee pain is not due to an injury. (2) Your pain has not improved after six weeks of treatment by your doctor. Treatment should include medications and other forms of therapy. These need to include home exercises or physical therapy. (3) Your doctor needs to have seen you in the office after treatment. (4) You need to have x-rays that fail to provide a diagnosis. We reviewed the notes we have. The notes do not show that you have had at least six weeks of such treatment. The notes do not show that you had x-rays. Based on the information we have, this test is not medically necessary. We used [employer] Benefits Management Clinical Guideline titled Imaging of the Extremities to make this decision. You may view this guideline at www.carelon.com/mbm-guidelines-radiology.  your doctor ordered an MRI of your shoulder because you have shoulder pain. An MRI is a way to take pictures of the inside of your body. This test should be used when your doctor is looking for a specific condition. These might include a broken bone or a tear in the shoulder. We reviewed the notes we have. The notes do not show that you are having this test for any of these reasons. The notes do not show what type of shoulder problem is suspected. Based on the information we have, this test is not medically necessary. We used [employer] Management Clinical Guideline titled Imaging of the Extremities to make this  decision. You may view this guideline at www.[employer]lon.com/mbm-guidelines-radiology.

## 2024-04-02 NOTE — Telephone Encounter (Signed)
 I Have her scheduled with Seema on Monday 7/7

## 2024-04-09 ENCOUNTER — Ambulatory Visit (HOSPITAL_BASED_OUTPATIENT_CLINIC_OR_DEPARTMENT_OTHER): Admitting: Nurse Practitioner

## 2024-04-09 DIAGNOSIS — Z91199 Patient's noncompliance with other medical treatment and regimen due to unspecified reason: Secondary | ICD-10-CM

## 2024-04-09 DIAGNOSIS — G894 Chronic pain syndrome: Secondary | ICD-10-CM

## 2024-04-09 NOTE — Progress Notes (Signed)
 04/09/2024- No Show

## 2024-05-01 ENCOUNTER — Emergency Department
Admission: EM | Admit: 2024-05-01 | Discharge: 2024-05-01 | Disposition: A | Discharge status: Home / Self Care | Attending: Emergency Medicine | Admitting: Emergency Medicine

## 2024-05-01 ENCOUNTER — Other Ambulatory Visit: Payer: Self-pay

## 2024-05-01 ENCOUNTER — Emergency Department

## 2024-05-01 DIAGNOSIS — X500XXA Overexertion from strenuous movement or load, initial encounter: Secondary | ICD-10-CM | POA: Insufficient documentation

## 2024-05-01 DIAGNOSIS — I10 Essential (primary) hypertension: Secondary | ICD-10-CM | POA: Diagnosis not present

## 2024-05-01 DIAGNOSIS — Z7901 Long term (current) use of anticoagulants: Secondary | ICD-10-CM | POA: Insufficient documentation

## 2024-05-01 DIAGNOSIS — M7712 Lateral epicondylitis, left elbow: Secondary | ICD-10-CM | POA: Insufficient documentation

## 2024-05-01 DIAGNOSIS — M25532 Pain in left wrist: Secondary | ICD-10-CM

## 2024-05-01 DIAGNOSIS — M79602 Pain in left arm: Secondary | ICD-10-CM | POA: Diagnosis present

## 2024-05-01 MED ORDER — DEXAMETHASONE SODIUM PHOSPHATE 10 MG/ML IJ SOLN
10.0000 mg | Freq: Once | INTRAMUSCULAR | Status: AC
  Administered 2024-05-01: 10 mg via INTRAMUSCULAR
  Filled 2024-05-01: qty 1

## 2024-05-01 MED ORDER — PREDNISONE 20 MG PO TABS: ORAL_TABLET | ORAL | 0 refills | Status: DC

## 2024-05-01 NOTE — Discharge Instructions (Addendum)
 Follow-up with your rheumatologist if any continued problems or concerns.  In addition to the splints that was placed on your wrist while in the emergency department today.  Obtain the tennis elbow splint that we talked about.  This basically is a Velcro band to be used.  You do not have to wear this while you are sleeping.  This will be located in the sports section of most pharmacies and also including [facility] in [employer].  A prescription for prednisone  was sent to the pharmacy for you to begin taking as directed.  You may also use ice to your elbow and wrist as needed for discomfort.

## 2024-05-01 NOTE — ED Provider Notes (Signed)
 [facility] Provider Note    Event Date/Time   First MD Initiated Contact with Patient 05/01/24 [phone]     (approximate)   History   Arm Pain   HPI  Regina Carpenter is a 48 y.o. female   presents to the ED with complaint of left lower arm pain that has been intermittent for the last 3 to 4 days and is worsened by movement and especially with lifting objects.  She states that when she does lift an object that is pain to the lateral aspect of her elbow.  No over-the-counter medication has been taken that has given her any relief.  Patient continues to take Eliquis  and denies any missed doses.  Patient has a history of rheumatoid arthritis, hypertension, chronic pain syndrome, PE, bilateral venous stasis ulcers lower extremities, peripheral vascular disease.      Physical Exam   Triage Vital Signs: ED Triage Vitals  Encounter Vitals Group     BP 05/01/24 0707 (!) 137/90     Girls Systolic BP Percentile --      Girls Diastolic BP Percentile --      Boys Systolic BP Percentile --      Boys Diastolic BP Percentile --      Pulse Rate 05/01/24 0707 77     Resp 05/01/24 0707 18     Temp 05/01/24 0707 98.1 F (36.7 C)     Temp Source 05/01/24 0707 Oral     SpO2 05/01/24 0707 99 %     Weight 05/01/24 0708 260 lb (117.9 kg)     Height 05/01/24 0708 5' 5 (1.651 m)     Head Circumference --      Peak Flow --      Pain Score --      Pain Loc --      Pain Education --      Exclude from Growth Chart --     Most recent vital signs: Vitals:   05/01/24 0707  BP: (!) 137/90  Pulse: 77  Resp: 18  Temp: 98.1 F (36.7 C)  SpO2: 99%     General: Awake, no distress.  CV:  Good peripheral perfusion.  Heart regular rate rhythm. Resp:  Normal effort.  Lungs clear bilaterally. Abd:  No distention.  Other:  On examination of the left upper extremity there is moderate tenderness on palpation of the left lateral epicondylar area which reproduces her pain.   Patient also has pain with making a fist in the same area.  Range of motion is that restriction and no crepitus was noted.  Skin is intact, no erythema.  There is some diffuse tenderness on palpation of the left wrist area but no crepitus is noted and range of motion is slightly limited secondary to increased pain.  Patient also has some discomfort with extension of the left thumb.  Skin is intact, no erythema or discoloration.  Pulses present.   ED Results / Procedures / Treatments   Labs (all labs ordered are listed, but only abnormal results are displayed) Labs Reviewed - No data to display    RADIOLOGY Left elbow x-ray images reviewed in the chart by myself independent of the radiologist no fracture or acute bony abnormalities are noted. Left wrist x-ray images reviewed interpreted by myself independent of the radiologist and no acute bony abnormalities were noted.  Official radiology report for both were negative.    PROCEDURES:  Critical Care performed:   Procedures   MEDICATIONS ORDERED  IN ED: Medications  dexamethasone  (DECADRON ) injection 10 mg (10 mg Intramuscular Given 05/01/24 0921)     IMPRESSION / MDM / ASSESSMENT AND PLAN / ED COURSE  I reviewed the triage vital signs and the nursing notes.   Differential diagnosis includes, but is not limited to, left upper extremity pain, strain, rheumatoid flare, epicondylitis, tendon injury, tendinitis.  48 year old female presents to the ED with complaint of left upper extremity pain without history of injury.  Patient does have a history of rheumatoid arthritis but states that this is different.  On exam findings were more consistent with a left lateral epicondylitis and also tendinitis of her left wrist.  We discussed a tennis elbow splint that she can find in any sports section of most pharmacies.  A wrist splint with thumb spica was placed on the patient while in the emergency department.  Because she is currently taking  Eliquis  an injection of Decadron  was given while in the ED and a short course of steroids.  Patient was made aware that she does not have to sleep with the braces.  If she is not improving she is needs to follow-up with her PCP or rheumatologist.      Patient's presentation is most consistent with acute complicated illness / injury requiring diagnostic workup.  FINAL CLINICAL IMPRESSION(S) / ED DIAGNOSES   Final diagnoses:  Lateral epicondylitis, left elbow  Wrist pain, acute, left     Rx / DC Orders   ED Discharge Orders          Ordered    predniSONE  (DELTASONE ) 20 MG tablet        05/01/24 0910             Note:  This document was prepared using Dragon voice recognition software and may include unintentional dictation errors.   Saunders Shona CROME, PA-C 05/01/24 9055    Waymond Lorelle Cummins, MD 05/01/24 1001

## 2024-05-01 NOTE — ED Triage Notes (Signed)
 Pt c/o L lower arm pain and intermittent swelling x3-4 days.  Pain score 10/10.  Pt reports it's a shooting pain from elbow into L wrist/hand.  Pt reports taking OTC medications w/o relief.    Denies injury.

## 2024-05-08 ENCOUNTER — Other Ambulatory Visit: Payer: Self-pay

## 2024-05-08 ENCOUNTER — Emergency Department
Admission: EM | Admit: 2024-05-08 | Discharge: 2024-05-08 | Disposition: A | Discharge status: Home / Self Care | Attending: Emergency Medicine | Admitting: Emergency Medicine

## 2024-05-08 DIAGNOSIS — M25562 Pain in left knee: Secondary | ICD-10-CM | POA: Insufficient documentation

## 2024-05-08 DIAGNOSIS — G8929 Other chronic pain: Secondary | ICD-10-CM | POA: Insufficient documentation

## 2024-05-08 MED ORDER — HYDROCODONE-ACETAMINOPHEN 5-325 MG PO TABS: 1.0000 | ORAL_TABLET | Freq: Four times a day (QID) | ORAL | 0 refills | Status: DC | PRN

## 2024-05-08 NOTE — ED Provider Notes (Signed)
 [facility] Provider Note    Event Date/Time   First MD Initiated Contact with Patient 05/08/24 1336     (approximate)   History   Knee Pain   HPI  Regina Carpenter is a 48 y.o. female with PMH of chronic left knee pain presents for evaluation of the same.  Patient has been evaluated by the pain clinic but does not have a pain contract with them yet.  She states that she previously had a corticosteroid injection in this knee which was helpful.  No new trauma or specific injury to the knee.      Physical Exam   Triage Vital Signs: ED Triage Vitals  Encounter Vitals Group     BP 05/08/24 1215 125/82     Girls Systolic BP Percentile --      Girls Diastolic BP Percentile --      Boys Systolic BP Percentile --      Boys Diastolic BP Percentile --      Pulse Rate 05/08/24 1215 91     Resp 05/08/24 1215 16     Temp 05/08/24 1215 98.7 F (37.1 C)     Temp Source 05/08/24 1215 Oral     SpO2 05/08/24 1215 100 %     Weight 05/08/24 1215 260 lb (117.9 kg)     Height 05/08/24 1215 5' 5 (1.651 m)     Head Circumference --      Peak Flow --      Pain Score 05/08/24 1214 8     Pain Loc --      Pain Education --      Exclude from Growth Chart --     Most recent vital signs: Vitals:   05/08/24 1215  BP: 125/82  Pulse: 91  Resp: 16  Temp: 98.7 F (37.1 C)  SpO2: 100%   General: Awake, no distress.  CV:  Good peripheral perfusion.  Resp:  Normal effort.  Abd:  No distention.  Other:  Mild swelling of the left knee when compared with the right but no overlying skin changes, warmth or erythema, tender to palpation just distal to the patella, range of motion well-maintained but does elicit some pain, no ligament laxity felt in varus and valgus stress   ED Results / Procedures / Treatments   Labs (all labs ordered are listed, but only abnormal results are displayed) Labs Reviewed - No data to display    PROCEDURES:  Critical Care performed:  No  Procedures   MEDICATIONS ORDERED IN ED: Medications - No data to display   IMPRESSION / MDM / ASSESSMENT AND PLAN / ED COURSE  I reviewed the triage vital signs and the nursing notes.                             48 year old female presents for evaluation of chronic left knee pain.  Vital signs are stable patient NAD on exam.  Differential diagnosis includes, but is not limited to, chronic left knee pain, ligament injury, meniscus injury, muscle strain, arthritis.  Patient's presentation is most consistent with acute, uncomplicated illness.  Did consider getting a left knee x-ray, however given that patient has not had any new injury or trauma feel this would be low yield.  Believe her pain is related to her chronic pain.  Patient is being evaluated by the pain clinic but has not yet gotten into a pain contract with them.  Discussed  that I can give her some pain medication but only for a few days.  I explained that if she needs a longer prescription and that she will need to get that through the pain clinic.  Knee was wrapped with an Ace bandage for comfort.  Discussed Tylenol , ibuprofen, ice and heat as needed for mild to moderate pain.  Will send prescription for Norco for severe pain.  Discussed following up with the pain clinic as well as orthopedics.  Patient did not need a note for work.  She voiced understanding, questions were answered and she was stable at discharge.     FINAL CLINICAL IMPRESSION(S) / ED DIAGNOSES   Final diagnoses:  Chronic pain of left knee     Rx / DC Orders   ED Discharge Orders          Ordered    HYDROcodone -acetaminophen  (NORCO/VICODIN) 5-325 MG tablet  Every 6 hours PRN        05/08/24 1421             Note:  This document was prepared using Dragon voice recognition software and may include unintentional dictation errors.   Cleaster Tinnie LABOR, PA-C 05/08/24 1426    Claudene Rover, MD 05/08/24 [phone]

## 2024-05-08 NOTE — ED Triage Notes (Signed)
 Pt reports left knee pain that is chronic and started having pain again 1 week ago.

## 2024-05-08 NOTE — Discharge Instructions (Addendum)
 You can take 650 mg of Tylenol  and 600 mg of ibuprofen every 6 hours as needed for pain. You can use ice, heat, muscle creams and other topical pain relievers as well.  I have sent a strong pain medication called Norco to the pharmacy.  This medication can be taken every 4-6 hours as needed for severe or breakthrough pain.  This medication can cause dependency so only take if you are unable to control your pain with other medications.  It will make you sleepy so do not drive or operate heavy machinery after taking it.  Do not drink alcohol while taking this medication.  This medication can also cause constipation so please take an over-the-counter stool softener like MiraLAX or Colace while taking it.  This medication contains both hydrocodone  and acetaminophen .  Do not take Tylenol  at the same time.  You can take the Norco or Tylenol  but not both.  Please schedule a follow-up appointment with the pain clinic as well as orthopedics.

## 2024-05-20 ENCOUNTER — Other Ambulatory Visit: Payer: Self-pay

## 2024-05-20 DIAGNOSIS — Z6841 Body Mass Index (BMI) 40.0 and over, adult: Secondary | ICD-10-CM

## 2024-05-20 DIAGNOSIS — Z7952 Long term (current) use of systemic steroids: Secondary | ICD-10-CM

## 2024-05-20 DIAGNOSIS — I739 Peripheral vascular disease, unspecified: Secondary | ICD-10-CM | POA: Diagnosis present

## 2024-05-20 DIAGNOSIS — Z87891 Personal history of nicotine dependence: Secondary | ICD-10-CM

## 2024-05-20 DIAGNOSIS — E872 Acidosis, unspecified: Secondary | ICD-10-CM | POA: Diagnosis present

## 2024-05-20 DIAGNOSIS — A419 Sepsis, unspecified organism: Principal | ICD-10-CM | POA: Diagnosis present

## 2024-05-20 DIAGNOSIS — Y835 Amputation of limb(s) as the cause of abnormal reaction of the patient, or of later complication, without mention of misadventure at the time of the procedure: Secondary | ICD-10-CM | POA: Diagnosis present

## 2024-05-20 DIAGNOSIS — T380X5A Adverse effect of glucocorticoids and synthetic analogues, initial encounter: Secondary | ICD-10-CM | POA: Diagnosis present

## 2024-05-20 DIAGNOSIS — D849 Immunodeficiency, unspecified: Secondary | ICD-10-CM | POA: Diagnosis present

## 2024-05-20 DIAGNOSIS — M300 Polyarteritis nodosa: Secondary | ICD-10-CM | POA: Diagnosis present

## 2024-05-20 DIAGNOSIS — I10 Essential (primary) hypertension: Secondary | ICD-10-CM | POA: Diagnosis present

## 2024-05-20 DIAGNOSIS — G546 Phantom limb syndrome with pain: Secondary | ICD-10-CM | POA: Diagnosis present

## 2024-05-20 DIAGNOSIS — G894 Chronic pain syndrome: Secondary | ICD-10-CM | POA: Diagnosis present

## 2024-05-20 DIAGNOSIS — Z79899 Other long term (current) drug therapy: Secondary | ICD-10-CM

## 2024-05-20 DIAGNOSIS — L03115 Cellulitis of right lower limb: Secondary | ICD-10-CM | POA: Diagnosis present

## 2024-05-20 DIAGNOSIS — M318 Other specified necrotizing vasculopathies: Secondary | ICD-10-CM | POA: Diagnosis present

## 2024-05-20 DIAGNOSIS — Z86718 Personal history of other venous thrombosis and embolism: Secondary | ICD-10-CM

## 2024-05-20 DIAGNOSIS — E2749 Other adrenocortical insufficiency: Secondary | ICD-10-CM | POA: Diagnosis present

## 2024-05-20 DIAGNOSIS — Z7901 Long term (current) use of anticoagulants: Secondary | ICD-10-CM

## 2024-05-20 DIAGNOSIS — R6520 Severe sepsis without septic shock: Secondary | ICD-10-CM | POA: Diagnosis present

## 2024-05-20 DIAGNOSIS — M069 Rheumatoid arthritis, unspecified: Secondary | ICD-10-CM | POA: Diagnosis present

## 2024-05-20 DIAGNOSIS — Z882 Allergy status to sulfonamides status: Secondary | ICD-10-CM

## 2024-05-20 DIAGNOSIS — I880 Nonspecific mesenteric lymphadenitis: Secondary | ICD-10-CM | POA: Diagnosis present

## 2024-05-20 DIAGNOSIS — T8743 Infection of amputation stump, right lower extremity: Principal | ICD-10-CM | POA: Diagnosis present

## 2024-05-20 DIAGNOSIS — Z888 Allergy status to other drugs, medicaments and biological substances status: Secondary | ICD-10-CM

## 2024-05-20 LAB — CBC WITH DIFFERENTIAL/PLATELET
Abs Immature Granulocytes: 0.03 K/uL (ref 0.00–0.07)
Basophils Absolute: 0 K/uL (ref 0.0–0.1)
Basophils Relative: 0 %
Eosinophils Absolute: 0.3 K/uL (ref 0.0–0.5)
Eosinophils Relative: 3 %
HCT: 38.7 % (ref 36.0–46.0)
Hemoglobin: 12.5 g/dL (ref 12.0–15.0)
Immature Granulocytes: 0 %
Lymphocytes Relative: 38 %
Lymphs Abs: 4.1 K/uL — ABNORMAL HIGH (ref 0.7–4.0)
MCH: 29.6 pg (ref 26.0–34.0)
MCHC: 32.3 g/dL (ref 30.0–36.0)
MCV: 91.7 fL (ref 80.0–100.0)
Monocytes Absolute: 0.9 K/uL (ref 0.1–1.0)
Monocytes Relative: 8 %
Neutro Abs: 5.6 K/uL (ref 1.7–7.7)
Neutrophils Relative %: 51 %
Platelets: 255 K/uL (ref 150–400)
RBC: 4.22 MIL/uL (ref 3.87–5.11)
RDW: 14.3 % (ref 11.5–15.5)
WBC: 10.9 K/uL — ABNORMAL HIGH (ref 4.0–10.5)
nRBC: 0 % (ref 0.0–0.2)

## 2024-05-20 LAB — COMPREHENSIVE METABOLIC PANEL WITH GFR
ALT: 18 U/L (ref 0–44)
AST: 24 U/L (ref 15–41)
Albumin: 3.8 g/dL (ref 3.5–5.0)
Alkaline Phosphatase: 74 U/L (ref 38–126)
Anion gap: 12 (ref 5–15)
BUN: 9 mg/dL (ref 6–20)
CO2: 24 mmol/L (ref 22–32)
Calcium: 9.3 mg/dL (ref 8.9–10.3)
Chloride: 103 mmol/L (ref 98–111)
Creatinine, Ser: 0.89 mg/dL (ref 0.44–1.00)
GFR, Estimated: >60 mL/min (ref >60)
Glucose, Bld: 136 mg/dL — ABNORMAL HIGH (ref 70–99)
Potassium: 3.6 mmol/L (ref 3.5–5.1)
Sodium: 139 mmol/L (ref 135–145)
Total Bilirubin: 0.6 mg/dL (ref 0.0–1.2)
Total Protein: 8 g/dL (ref 6.5–8.1)

## 2024-05-20 LAB — LACTIC ACID, PLASMA: Lactic Acid, Venous: 2.6 mmol/L (ref 0.5–1.9)

## 2024-05-20 NOTE — ED Triage Notes (Signed)
 Pt to ed from home via POV for chronic ulcers. Pt is caox4, in no acute distress and ambulatory to wheelchair.

## 2024-05-21 ENCOUNTER — Emergency Department

## 2024-05-21 ENCOUNTER — Inpatient Hospital Stay
Admission: EM | Admit: 2024-05-21 | Discharge: 2024-05-23 | DRG: 872 | Disposition: A | Discharge status: Home / Self Care | Attending: Internal Medicine | Admitting: Internal Medicine

## 2024-05-21 DIAGNOSIS — M069 Rheumatoid arthritis, unspecified: Secondary | ICD-10-CM | POA: Diagnosis present

## 2024-05-21 DIAGNOSIS — G546 Phantom limb syndrome with pain: Secondary | ICD-10-CM | POA: Diagnosis present

## 2024-05-21 DIAGNOSIS — I10 Essential (primary) hypertension: Secondary | ICD-10-CM | POA: Diagnosis present

## 2024-05-21 DIAGNOSIS — L089 Local infection of the skin and subcutaneous tissue, unspecified: Principal | ICD-10-CM

## 2024-05-21 DIAGNOSIS — I739 Peripheral vascular disease, unspecified: Secondary | ICD-10-CM | POA: Diagnosis present

## 2024-05-21 DIAGNOSIS — E872 Acidosis, unspecified: Secondary | ICD-10-CM

## 2024-05-21 DIAGNOSIS — A419 Sepsis, unspecified organism: Secondary | ICD-10-CM | POA: Diagnosis present

## 2024-05-21 DIAGNOSIS — A401 Sepsis due to streptococcus, group B: Secondary | ICD-10-CM | POA: Diagnosis not present

## 2024-05-21 DIAGNOSIS — M318 Other specified necrotizing vasculopathies: Secondary | ICD-10-CM | POA: Diagnosis present

## 2024-05-21 DIAGNOSIS — L0390 Cellulitis, unspecified: Secondary | ICD-10-CM | POA: Diagnosis present

## 2024-05-21 LAB — PROTIME-INR
INR: 1.1 (ref 0.8–1.2)
Prothrombin Time: 14.7 s (ref 11.4–15.2)

## 2024-05-21 LAB — HIV ANTIBODY (ROUTINE TESTING W REFLEX): HIV Screen 4th Generation wRfx: NONREACTIVE

## 2024-05-21 LAB — LACTIC ACID, PLASMA: Lactic Acid, Venous: 1.7 mmol/L (ref 0.5–1.9)

## 2024-05-21 LAB — CORTISOL-AM, BLOOD: Cortisol - AM: 2.1 ug/dL — ABNORMAL LOW (ref 6.7–22.6)

## 2024-05-21 MED ORDER — ENOXAPARIN SODIUM 40 MG/0.4ML IJ SOSY: 40.0000 mg | PREFILLED_SYRINGE | INTRAMUSCULAR | Status: DC

## 2024-05-21 MED ORDER — VANCOMYCIN HCL 1750 MG/350ML IV SOLN
1750.0000 mg | INTRAVENOUS | Status: DC
  Administered 2024-05-22: 1750 mg via INTRAVENOUS
  Filled 2024-05-21 (×2): qty 350

## 2024-05-21 MED ORDER — SODIUM CHLORIDE 0.9 % IV SOLN
1.0000 g | INTRAVENOUS | Status: DC
  Administered 2024-05-21: 1 g via INTRAVENOUS
  Filled 2024-05-21: qty 10

## 2024-05-21 MED ORDER — LACTATED RINGERS IV SOLN
150.0000 mL/h | INTRAVENOUS | Status: AC
  Administered 2024-05-21 (×2): 150 mL/h via INTRAVENOUS

## 2024-05-21 MED ORDER — PREDNISONE 20 MG PO TABS
20.0000 mg | ORAL_TABLET | Freq: Every day | ORAL | Status: DC
  Administered 2024-05-22 – 2024-05-23 (×2): 20 mg via ORAL
  Filled 2024-05-21 (×2): qty 1

## 2024-05-21 MED ORDER — ONDANSETRON HCL 4 MG PO TABS
4.0000 mg | ORAL_TABLET | Freq: Four times a day (QID) | ORAL | Status: DC | PRN
Start: 2024-05-21 — End: 2024-05-23

## 2024-05-21 MED ORDER — GABAPENTIN 300 MG PO CAPS
300.0000 mg | ORAL_CAPSULE | Freq: Three times a day (TID) | ORAL | Status: DC
  Administered 2024-05-21 – 2024-05-23 (×7): 300 mg via ORAL
  Filled 2024-05-21 (×7): qty 1

## 2024-05-21 MED ORDER — SODIUM CHLORIDE 0.9 % IV SOLN
2.0000 g | Freq: Three times a day (TID) | INTRAVENOUS | Status: DC
  Administered 2024-05-21 – 2024-05-23 (×6): 2 g via INTRAVENOUS
  Filled 2024-05-21 (×8): qty 12.5

## 2024-05-21 MED ORDER — COLCHICINE 0.6 MG PO TABS
0.6000 mg | ORAL_TABLET | Freq: Every day | ORAL | Status: DC
  Administered 2024-05-21 – 2024-05-23 (×3): 0.6 mg via ORAL
  Filled 2024-05-21 (×4): qty 1

## 2024-05-21 MED ORDER — ONDANSETRON HCL 4 MG/2ML IJ SOLN: 4.0000 mg | Freq: Four times a day (QID) | INTRAMUSCULAR | Status: DC | PRN

## 2024-05-21 MED ORDER — METHOTREXATE SODIUM 2.5 MG PO TABS: 20.0000 mg | ORAL_TABLET | ORAL | Status: DC

## 2024-05-21 MED ORDER — HYDROCODONE-ACETAMINOPHEN 5-325 MG PO TABS
1.0000 | ORAL_TABLET | Freq: Four times a day (QID) | ORAL | Status: DC | PRN
  Administered 2024-05-21 (×2): 1 via ORAL
  Filled 2024-05-21 (×2): qty 1

## 2024-05-21 MED ORDER — ACETAMINOPHEN 650 MG RE SUPP: 650.0000 mg | Freq: Four times a day (QID) | RECTAL | Status: DC | PRN

## 2024-05-21 MED ORDER — HYDROXYCHLOROQUINE SULFATE 200 MG PO TABS
200.0000 mg | ORAL_TABLET | Freq: Two times a day (BID) | ORAL | Status: DC
Start: 2024-05-21 — End: 2024-05-23
  Administered 2024-05-21 – 2024-05-23 (×5): 200 mg via ORAL
  Filled 2024-05-21 (×5): qty 1

## 2024-05-21 MED ORDER — VENLAFAXINE HCL ER 75 MG PO CP24
75.0000 mg | ORAL_CAPSULE | Freq: Every day | ORAL | Status: DC
  Administered 2024-05-21 – 2024-05-23 (×3): 75 mg via ORAL
  Filled 2024-05-21 (×3): qty 1

## 2024-05-21 MED ORDER — SODIUM CHLORIDE 0.9 % IV SOLN
2.0000 g | Freq: Once | INTRAVENOUS | Status: AC
  Administered 2024-05-21: 2 g via INTRAVENOUS
  Filled 2024-05-21: qty 12.5

## 2024-05-21 MED ORDER — VANCOMYCIN HCL IN DEXTROSE 1-5 GM/200ML-% IV SOLN
1000.0000 mg | Freq: Once | INTRAVENOUS | Status: AC
  Administered 2024-05-21: 1000 mg via INTRAVENOUS
  Filled 2024-05-21: qty 200

## 2024-05-21 MED ORDER — POLYETHYLENE GLYCOL 3350 17 G PO PACK: 17.0000 g | PACK | Freq: Every day | ORAL | Status: DC | PRN

## 2024-05-21 MED ORDER — VANCOMYCIN HCL 1500 MG/300ML IV SOLN
1500.0000 mg | Freq: Once | INTRAVENOUS | Status: AC
  Administered 2024-05-21: 1500 mg via INTRAVENOUS
  Filled 2024-05-21: qty 300

## 2024-05-21 MED ORDER — MORPHINE SULFATE (PF) 4 MG/ML IV SOLN
4.0000 mg | Freq: Once | INTRAVENOUS | Status: AC
  Administered 2024-05-21: 4 mg via INTRAVENOUS
  Filled 2024-05-21: qty 1

## 2024-05-21 MED ORDER — ACETAMINOPHEN 325 MG PO TABS
650.0000 mg | ORAL_TABLET | Freq: Four times a day (QID) | ORAL | Status: DC | PRN
  Administered 2024-05-21: 650 mg via ORAL
  Filled 2024-05-21 (×2): qty 2

## 2024-05-21 MED ORDER — APIXABAN 5 MG PO TABS
5.0000 mg | ORAL_TABLET | Freq: Two times a day (BID) | ORAL | Status: DC
  Administered 2024-05-21 – 2024-05-23 (×5): 5 mg via ORAL
  Filled 2024-05-21 (×5): qty 1

## 2024-05-21 MED ORDER — SODIUM CHLORIDE 0.9 % IV BOLUS
1500.0000 mL | Freq: Once | INTRAVENOUS | Status: AC
  Administered 2024-05-21: 1500 mL via INTRAVENOUS

## 2024-05-21 MED ORDER — MORPHINE SULFATE (PF) 2 MG/ML IV SOLN
2.0000 mg | INTRAVENOUS | Status: DC | PRN
  Administered 2024-05-21: 2 mg via INTRAVENOUS
  Filled 2024-05-21: qty 1

## 2024-05-21 NOTE — Progress Notes (Signed)
 ED Pharmacy Antibiotic Sign Off An antibiotic consult was received from an ED provider for Vancomycin  per pharmacy dosing for wound infection. A chart review was completed to assess appropriateness.   The following one time order(s) were placed:  Vancomycin  2500 mg IV X 1.   Further antibiotic and/or antibiotic pharmacy consults should be ordered by the admitting provider if indicated.   Thank you for allowing pharmacy to be a part of this patient's care.   Silver Selinda BIRCH [facility]  Clinical Pharmacist 05/21/24 2:47 AM

## 2024-05-21 NOTE — Plan of Care (Signed)
  Problem: Clinical Measurements: Goal: Diagnostic test results will improve Outcome: Progressing   Problem: Respiratory: Goal: Ability to maintain adequate ventilation will improve Outcome: Progressing   Problem: Clinical Measurements: Goal: Respiratory complications will improve Outcome: Progressing Goal: Cardiovascular complication will be avoided Outcome: Progressing   Problem: Activity: Goal: Risk for activity intolerance will decrease Outcome: Progressing   Problem: Coping: Goal: Level of anxiety will decrease Outcome: Progressing   Problem: Elimination: Goal: Will not experience complications related to urinary retention Outcome: Progressing   Problem: Safety: Goal: Ability to remain free from injury will improve Outcome: Progressing   Problem: Skin Integrity: Goal: Risk for impaired skin integrity will decrease Outcome: Progressing

## 2024-05-21 NOTE — H&P (Signed)
 History and Physical    Regina Carpenter FMW:[id number] DOB: [birth year] DOA: 05/21/2024  DOS: the patient was seen and examined on 05/21/2024  PCP: [facility]   Patient coming from: Home  I have personally briefly reviewed patient's old medical records in [facility]  Chief Complaint: Right feet pain and tenderness  HPI: Regina Carpenter is a pleasant 48 y.o. 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  Patient is stated that it had worsened over the last couple of days.  Patient is stated that her pain was 9/10 in intensity, throbbing in nature, improved with pain medications.  She denies any fever or chills, drainage, trauma.  Patient stated that she has been not taking any anticoagulation for the last 3 weeks.  ED Course: Upon arrival to the ED, patient is found to be tachycardic, afebrile, no leukocytosis.  Patient received cefepime  and vancomycin  and hospitalist service was consulted for evaluation for admission.  Review of Systems:  ROS  All other systems negative except as noted in the HPI.  Past Medical History:  Diagnosis Date   Arthralgia of right knee    Arthritis    Cellulitis of left leg    Collagen vascular disease (HCC)    Lymphadenopathy, mesenteric    Right hand pain    Tobacco abuse     Past Surgical History:  Procedure Laterality Date   denies     LOWER EXTREMITY ANGIOGRAPHY Bilateral 05/19/2020   Procedure: Lower Extremity Angiography;  Surgeon: Marea Selinda RAMAN, MD;  Location: [facility] INVASIVE CV LAB;  Service: Cardiovascular;  Laterality: Bilateral;   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   Procedure: TRANSMETATARSAL AMPUTATION;  Surgeon: Neill Boas, DPM;  Location: [facility]  ORS;  Service: Podiatry;  Laterality: Right;     reports that she has been smoking cigarettes. She has never used smokeless tobacco. She reports that she does not drink alcohol and does not use drugs.  Allergies  Allergen Reactions   Ibuprofen Other (See Comments)    Pt states that she sweats profusely after taking ibuprofen Other reaction(s): Other (See Comments) sweating:   Sulfa  Antibiotics Rash   Sulfasalazine Rash    Family History  Problem Relation Age of Onset   Leukemia Maternal Grandfather    Alcohol abuse Father     Prior to Admission medications   Medication Sig Start Date End Date Taking? Authorizing Provider  acetaminophen  (TYLENOL ) 500 MG tablet Take 1,000 mg by mouth. 05/09/21   [provider]  colchicine  0.6 MG tablet Take 0.6 mg by mouth daily. 03/02/24   [provider]  ELIQUIS  5 MG TABS tablet Take 5 mg by mouth 2 (two) times daily. 03/25/21   [provider]  gabapentin  (NEURONTIN ) 600 MG tablet Take 0.5 tablets (300 mg total) by mouth 3 (three) times daily. 07/24/20   Tobie Yetta HERO, MD  HYDROcodone -acetaminophen  (NORCO/VICODIN) 5-325 MG tablet Take 1 tablet by mouth every 6 (six) hours as needed for severe pain (pain score 7-10). 05/08/24 05/08/25  Cleaster Tinnie LABOR, PA-C  hydroxychloroquine  (PLAQUENIL ) 200 MG tablet Take 200 mg by mouth 2 (two) times daily.    [provider]  methotrexate  (RHEUMATREX) 2.5 MG tablet Take 20 mg by mouth once a week.    [provider]  predniSONE  (DELTASONE )  20 MG tablet Take 2 tablets once a day for 3 days and then 1 tablet once a day for 3 days 05/01/24   Saunders Shona CROME, PA-C  saccharomyces boulardii (FLORASTOR) 250 MG capsule Take 1 capsule (250 mg total) by mouth 2 (two) times daily. 07/25/20   Tobie Yetta HERO, MD  venlafaxine  XR (EFFEXOR -XR) 75 MG 24 hr capsule Take 75 mg by mouth daily.    [provider]    Physical Exam: Vitals:   05/21/24 0500 05/21/24 0502  05/21/24 0535 05/21/24 0805  BP: 119/82  (!) 132/94 115/83  Pulse: (!) 101  (!) 104 97  Resp: 18  18 18   Temp:  99.8 F (37.7 C) 99.3 F (37.4 C) 98.1 F (36.7 C)  TempSrc:  Oral    SpO2: 100%  98% 91%  Weight:   114 kg   Height:   5' 5 (1.651 m)     Physical Exam   Constitutional: Alert, awake, calm, comfortable HEENT: Neck supple Respiratory: Clear to auscultation B/L, no wheezing, no rales.  Cardiovascular: Regular rate and rhythm, no murmurs / rubs / gallops. No extremity edema. 2+ pedal pulses. No carotid bruits.  Abdomen: Soft, no tenderness, Bowel sounds positive.  Musculoskeletal: no clubbing / cyanosis. Good ROM, no contractures. Normal muscle tone.  Skin: Right foot TMA, pus point, redness, tender, swelling present. Neurologic: CN 2-12 grossly intact. Sensation intact, No focal deficit identified Psychiatric: Alert and oriented x 3. Normal mood.    Labs on Admission: I have personally reviewed following labs and imaging studies  CBC: Recent Labs  Lab 05/20/24 2238  WBC 10.9*  NEUTROABS 5.6  HGB 12.5  HCT 38.7  MCV 91.7  PLT 255   Basic Metabolic Panel: Recent Labs  Lab 05/20/24 2238  NA 139  K 3.6  CL 103  CO2 24  GLUCOSE 136*  BUN 9  CREATININE 0.89  CALCIUM 9.3   GFR: Estimated Creatinine Clearance: 97.4 mL/min (by C-G formula based on SCr of 0.89 mg/dL). Liver Function Tests: Recent Labs  Lab 05/20/24 2238  AST 24  ALT 18  ALKPHOS 74  BILITOT 0.6  PROT 8.0  ALBUMIN 3.8   No results for input(s): LIPASE, AMYLASE in the last 168 hours. No results for input(s): AMMONIA in the last 168 hours. Coagulation Profile: Recent Labs  Lab 05/21/24 0550  INR 1.1   Cardiac Enzymes: No results for input(s): CKTOTAL, CKMB, CKMBINDEX, TROPONINI, TROPONINIHS in the last 168 hours. BNP (last 3 results) Recent Labs    01/06/24 1353  BNP 15.5   HbA1C: No results for input(s): HGBA1C in the last 72 hours. CBG: No results for  input(s): GLUCAP in the last 168 hours. Lipid Profile: No results for input(s): CHOL, HDL, LDLCALC, TRIG, CHOLHDL, LDLDIRECT in the last 72 hours. Thyroid Function Tests: No results for input(s): TSH, T4TOTAL, FREET4, T3FREE, THYROIDAB in the last 72 hours. Anemia Panel: No results for input(s): VITAMINB12, FOLATE, FERRITIN, TIBC, IRON, RETICCTPCT in the last 72 hours. Urine analysis:    Component Value Date/Time   COLORURINE YELLOW (A) 03/12/2023 1336   APPEARANCEUR CLEAR (A) 03/12/2023 1336   LABSPEC 1.045 (H) 03/12/2023 1336   PHURINE 6.0 03/12/2023 1336   GLUCOSEU NEGATIVE 03/12/2023 1336   HGBUR NEGATIVE 03/12/2023 1336   BILIRUBINUR NEGATIVE 03/12/2023 1336   KETONESUR NEGATIVE 03/12/2023 1336   PROTEINUR NEGATIVE 03/12/2023 1336   NITRITE NEGATIVE 03/12/2023 1336   LEUKOCYTESUR TRACE (A) 03/12/2023 1336    Radiological Exams on Admission:  I have personally reviewed images DG Foot Complete Right Result Date: 05/21/2024 CLINICAL DATA:  Redness, warmth, swelling, pain EXAM: RIGHT FOOT COMPLETE - 3+ VIEW COMPARISON:  07/17/2020 FINDINGS: Changes of prior transmetatarsal amputation of all 5 toes. No bone destruction to suggest acute osteomyelitis. No acute bony abnormality. Specifically, no fracture, subluxation, or dislocation. No soft tissue gas. IMPRESSION: No acute bony abnormality. Electronically Signed   By: Franky Crease M.D.   On: 05/21/2024 03:25   US  Venous Img Lower Unilateral Right Result Date: 05/21/2024 CLINICAL DATA:  Right lower extremity pain, redness EXAM: RIGHT LOWER EXTREMITY VENOUS DOPPLER ULTRASOUND TECHNIQUE: Gray-scale sonography with compression, as well as color and duplex ultrasound, were performed to evaluate the deep venous system(s) from the level of the common femoral vein through the popliteal and proximal calf veins. COMPARISON:  01/06/2024 FINDINGS: VENOUS Normal compressibility of the common femoral, superficial  femoral, and popliteal veins, as well as the visualized calf veins. Visualized portions of profunda femoral vein and great saphenous vein unremarkable. No filling defects to suggest DVT on grayscale or color Doppler imaging. Doppler waveforms show normal direction of venous flow, normal respiratory plasticity and response to augmentation. Limited views of the contralateral common femoral vein are unremarkable. OTHER None. Limitations: none IMPRESSION: Negative. Electronically Signed   By: Franky Crease M.D.   On: 05/21/2024 03:05    EKG: My personal interpretation of EKG shows: Sinus rhythm    Assessment/Plan Principal Problem:   Sepsis (HCC) Active Problems:   Peripheral vascular disease (HCC)   Phantom limb syndrome with pain (HCC) (Right)   Rheumatoid arthritis (HCC)   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HCC)   Essential hypertension   Cellulitis    Assessment and Plan:  48 year old female double/PMH of peripheral vascular disease, rheumatoid arthritis, systemic vasculitis, HTN, phantom limb syndrome, right foot TMA now presented with right foot pain, swelling for few days.  1.  Cellulitis - He does not appear to have an abscess, no drainage - Patient has severe tenderness, redness and puncture point - Patient received vancomycin  and cefepime  in the emergency room. - Cultures were sent - I will place her in observation, continue antibiotic cefepime  and vancomycin  - Follow-up cultures - Pain control  2.  Rheumatoid arthritis/peripheral vascular disease/systemic vasculitis - Continue home medications  3.  Hypertension - Blood pressure control -Continue-continue to monitor - There is no blood pressure medication listed in her home medications. - Will add blood pressure medication if needed  4.  History of DVT - Continue Eliquis      DVT prophylaxis: Eliquis  Code Status: Full Code Family Communication: None available  Disposition Plan: Home  Consults called: None  Admission status:  Inpatient, Med-Surg   Nena Rebel, MD [facility] 05/21/2024, 11:16 AM

## 2024-05-21 NOTE — ED Provider Notes (Signed)
 [facility] Provider Note    Event Date/Time   First MD Initiated Contact with Patient 05/21/24 [phone]     (approximate)   History   Skin Ulcer   HPI  Regina Carpenter is a 48 y.o. 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  Worsening over the last couple of days.  No fevers no chills.  Has been off of her anticoagulation for 3 weeks for unknown reason.  Denies chest pain shortness of breath or any other acute medical complaints.  She does not relate any open wounds on the foot.        Physical Exam   Triage Vital Signs: ED Triage Vitals  Encounter Vitals Group     BP 05/20/24 2240 118/77     Girls Systolic BP Percentile --      Girls Diastolic BP Percentile --      Boys Systolic BP Percentile --      Boys Diastolic BP Percentile --      Pulse Rate 05/20/24 2240 (!) 110     Resp 05/20/24 2240 18     Temp 05/20/24 2240 98 F (36.7 C)     Temp Source 05/20/24 2240 Oral     SpO2 05/20/24 2237 96 %     Weight --      Height 05/20/24 2238 5' 5 (1.651 m)     Head Circumference --      Peak Flow --      Pain Score 05/20/24 2237 10     Pain Loc --      Pain Education --      Exclude from Growth Chart --     Most recent vital signs: Vitals:   05/21/24 0232 05/21/24 0300  BP: 102/78 116/83  Pulse: (!) 117 (!) 103  Resp: 20   Temp: 99.7 F (37.6 C)   SpO2: 98% 99%    General: Awake, no distress.  CV:  Good peripheral perfusion.  Resp:  Normal effort.  Abd:  No distention.  Other:  Warmth and redness to the right foot, does not seem to extend past the ankle.  There is some asymmetric swelling of the foot ankle and lower calf on the right side compared to the left.  There is a old appearing  scabbed over ulceration to the heel that it does not appear actively infected and she has 2 small punctate superficial wounds to the medial ankle.  The limb appears warm and well-perfused.  There is no pain out of proportion or crepitus.   ED Results / Procedures / Treatments   Labs (all labs ordered are listed, but only abnormal results are displayed) Labs Reviewed  LACTIC ACID, PLASMA - Abnormal; Notable for the following components:      Result Value   Lactic Acid, Venous 2.6 (*)    All other components within normal limits  COMPREHENSIVE METABOLIC PANEL WITH GFR - Abnormal; Notable for the following components:   Glucose, Bld 136 (*)    All other components within normal limits  CBC WITH DIFFERENTIAL/PLATELET - Abnormal; Notable for the following components:   WBC 10.9 (*)    Lymphs Abs 4.1 (*)    All other components within normal limits  CULTURE, BLOOD (ROUTINE X 2)  CULTURE, BLOOD (ROUTINE X  2)  LACTIC ACID, PLASMA  URINALYSIS, W/ REFLEX TO CULTURE (INFECTION SUSPECTED)     I ordered and reviewed the above labs they are notable for lactic acidosis and mild leukocytosis.  EKG  ED ECG REPORT I, Ginnie Shams, the attending physician, personally viewed and interpreted this ECG.   Date: 05/21/2024  EKG Time: 0328  Rate: 97  Rhythm: sinus  Axis: nl  Intervals:nl  ST&T Change: no stemi    RADIOLOGY I independently reviewed and interpreted x-ray of the foot and see no bony erosive changes I also reviewed radiologist's formal read.   PROCEDURES:  Critical Care performed: Yes, see critical care procedure note(s)  .Critical Care  Performed by: Shams Ginnie, MD Authorized by: Shams Ginnie, MD   Critical care provider statement:    Critical care time (minutes):  30   Critical care was time spent personally by me on the following activities:  Development of treatment plan with patient or surrogate, discussions with consultants, evaluation of patient's response to  treatment, examination of patient, ordering and review of laboratory studies, ordering and review of radiographic studies, ordering and performing treatments and interventions, pulse oximetry, re-evaluation of patient's condition and review of old charts    MEDICATIONS ORDERED IN ED: Medications  vancomycin  (VANCOCIN ) IVPB 1000 mg/200 mL premix (1,000 mg Intravenous New Bag/Given 05/21/24 0349)    Followed by  vancomycin  LUCRECIA) IVPB 1500 mg/300 mL (has no administration in time range)  ceFEPIme  (MAXIPIME ) 2 g in sodium chloride  0.9 % 100 mL IVPB (0 g Intravenous Stopped 05/21/24 0347)  sodium chloride  0.9 % bolus 1,500 mL (1,500 mLs Intravenous New Bag/Given 05/21/24 0316)  morphine  (PF) 4 MG/ML injection 4 mg (4 mg Intravenous Given 05/21/24 0308)    External physician / consultants:  I spoke with hospital medicine for admission and regarding care plan for this patient.   IMPRESSION / MDM / ASSESSMENT AND PLAN / ED COURSE  I reviewed the triage vital signs and the nursing notes.                                Patient's presentation is most consistent with acute presentation with potential threat to life or bodily function.  Differential diagnosis includes, but is not limited to, foot infection, cellulitis, necrotizing fasciitis, osteomyelitis, DVT, sepsis    MDM:    Redness warmth to the right foot with pain concerning for infection given lactic acidosis mild leukocytosis.  Also consider DVT as she has been off of her anticoagulation, got a DVT ultrasound which looks negative.  There is no open wounds, no pain out of proportion, no bony erosive changes on x-ray so I considered but doubt osteomyelitis, necrotizing fasciitis, but I do worry about sepsis given her lactic acidosis.  I have given her a 30 cc/kg ideal body weight fluid bolus as well as vancomycin  and cefepime .  IV morphine  for pain control.  She has chronic left knee pain that she has been seen in the emergency  department several times recently but she states that that pain is chronic and unchanged today, is able to range fully I doubt septic joint.  She is able to range at the ankle as well so I doubt septic joint of the affected left foot.  Most of the pain is at the amputation site towards the distal metatarsals.. Admission.         FINAL CLINICAL IMPRESSION(S) / ED DIAGNOSES   Final  diagnoses:  Right foot infection  Sepsis, due to unspecified organism, unspecified whether acute organ dysfunction present (HCC)  Lactic acidosis     Rx / DC Orders   ED Discharge Orders     None        Note:  This document was prepared using Dragon voice recognition software and may include unintentional dictation errors.    Cyrena Mylar, MD 05/21/24 [phone]

## 2024-05-21 NOTE — Progress Notes (Signed)
 Pharmacy Antibiotic Note  Regina Carpenter is a 48 y.o. female admitted on 05/21/2024 with sepsis.  Pharmacy has been consulted for Vancomycin  dosing.  Plan: Vancomycin  1 gm IV X 1 given in ED on 8/18 @ 0349.  Additional Vanc 1500 mg IV X 1 ordered to make total loading dose of 2500 mg.  Vancomycin  1750 mg IV Q24H ordered to start on 8/19 @ 0400.  AUC = 477.8 Vanc trough = 9.8   Height: 5' 5 (165.1 cm) IBW/kg (Calculated) : 57  Temp (24hrs), Avg:98.9 F (37.2 C), Min:98 F (36.7 C), Max:99.7 F (37.6 C)  Recent Labs  Lab 05/20/24 2238 05/21/24 0256  WBC 10.9*  --   CREATININE 0.89  --   LATICACIDVEN 2.6* 1.7    Estimated Creatinine Clearance: 99.3 mL/min (by C-G formula based on SCr of 0.89 mg/dL).    Allergies  Allergen Reactions   Ibuprofen Other (See Comments)    Pt states that she sweats profusely after taking ibuprofen Other reaction(s): Other (See Comments) sweating:   Sulfa  Antibiotics Rash   Sulfasalazine Rash    Antimicrobials this admission:   >>    >>   Dose adjustments this admission:   Microbiology results:  BCx:   UCx:    Sputum:    MRSA PCR:   Thank you for allowing pharmacy to be a part of this patient's care.  Rafeef Lau D 05/21/2024 4:52 AM

## 2024-05-22 DIAGNOSIS — T380X5A Adverse effect of glucocorticoids and synthetic analogues, initial encounter: Secondary | ICD-10-CM | POA: Diagnosis present

## 2024-05-22 DIAGNOSIS — E2749 Other adrenocortical insufficiency: Secondary | ICD-10-CM | POA: Diagnosis present

## 2024-05-22 DIAGNOSIS — M069 Rheumatoid arthritis, unspecified: Secondary | ICD-10-CM | POA: Diagnosis present

## 2024-05-22 DIAGNOSIS — Y835 Amputation of limb(s) as the cause of abnormal reaction of the patient, or of later complication, without mention of misadventure at the time of the procedure: Secondary | ICD-10-CM | POA: Diagnosis present

## 2024-05-22 DIAGNOSIS — G546 Phantom limb syndrome with pain: Secondary | ICD-10-CM | POA: Diagnosis present

## 2024-05-22 DIAGNOSIS — M300 Polyarteritis nodosa: Secondary | ICD-10-CM | POA: Diagnosis present

## 2024-05-22 DIAGNOSIS — G894 Chronic pain syndrome: Secondary | ICD-10-CM | POA: Diagnosis present

## 2024-05-22 DIAGNOSIS — R6520 Severe sepsis without septic shock: Secondary | ICD-10-CM | POA: Diagnosis present

## 2024-05-22 DIAGNOSIS — Z888 Allergy status to other drugs, medicaments and biological substances status: Secondary | ICD-10-CM | POA: Diagnosis not present

## 2024-05-22 DIAGNOSIS — I10 Essential (primary) hypertension: Secondary | ICD-10-CM | POA: Diagnosis present

## 2024-05-22 DIAGNOSIS — Z87891 Personal history of nicotine dependence: Secondary | ICD-10-CM | POA: Diagnosis not present

## 2024-05-22 DIAGNOSIS — E872 Acidosis, unspecified: Secondary | ICD-10-CM | POA: Diagnosis present

## 2024-05-22 DIAGNOSIS — Z79899 Other long term (current) drug therapy: Secondary | ICD-10-CM | POA: Diagnosis not present

## 2024-05-22 DIAGNOSIS — T8743 Infection of amputation stump, right lower extremity: Secondary | ICD-10-CM | POA: Diagnosis present

## 2024-05-22 DIAGNOSIS — Z6841 Body Mass Index (BMI) 40.0 and over, adult: Secondary | ICD-10-CM | POA: Diagnosis not present

## 2024-05-22 DIAGNOSIS — M318 Other specified necrotizing vasculopathies: Secondary | ICD-10-CM | POA: Diagnosis present

## 2024-05-22 DIAGNOSIS — L03115 Cellulitis of right lower limb: Secondary | ICD-10-CM | POA: Diagnosis present

## 2024-05-22 DIAGNOSIS — A419 Sepsis, unspecified organism: Secondary | ICD-10-CM

## 2024-05-22 DIAGNOSIS — I739 Peripheral vascular disease, unspecified: Secondary | ICD-10-CM | POA: Diagnosis present

## 2024-05-22 DIAGNOSIS — Z7901 Long term (current) use of anticoagulants: Secondary | ICD-10-CM | POA: Diagnosis not present

## 2024-05-22 DIAGNOSIS — Z882 Allergy status to sulfonamides status: Secondary | ICD-10-CM | POA: Diagnosis not present

## 2024-05-22 DIAGNOSIS — D849 Immunodeficiency, unspecified: Secondary | ICD-10-CM | POA: Diagnosis present

## 2024-05-22 DIAGNOSIS — Z86718 Personal history of other venous thrombosis and embolism: Secondary | ICD-10-CM | POA: Diagnosis not present

## 2024-05-22 DIAGNOSIS — I880 Nonspecific mesenteric lymphadenitis: Secondary | ICD-10-CM | POA: Diagnosis present

## 2024-05-22 LAB — URINALYSIS, W/ REFLEX TO CULTURE (INFECTION SUSPECTED)
Bacteria, UA: NONE SEEN
Bilirubin Urine: NEGATIVE
Glucose, UA: NEGATIVE mg/dL
Hgb urine dipstick: NEGATIVE
Ketones, ur: NEGATIVE mg/dL
Leukocytes,Ua: NEGATIVE
Nitrite: NEGATIVE
Protein, ur: NEGATIVE mg/dL
Specific Gravity, Urine: 1.006 (ref 1.005–1.030)
pH: 5 (ref 5.0–8.0)

## 2024-05-22 LAB — CBC
HCT: 36.3 % (ref 36.0–46.0)
Hemoglobin: 11.9 g/dL — ABNORMAL LOW (ref 12.0–15.0)
MCH: 29.8 pg (ref 26.0–34.0)
MCHC: 32.8 g/dL (ref 30.0–36.0)
MCV: 90.8 fL (ref 80.0–100.0)
Platelets: 228 K/uL (ref 150–400)
RBC: 4 MIL/uL (ref 3.87–5.11)
RDW: 14 % (ref 11.5–15.5)
WBC: 7.5 K/uL (ref 4.0–10.5)
nRBC: 0 % (ref 0.0–0.2)

## 2024-05-22 LAB — PROTIME-INR
INR: 1.2 (ref 0.8–1.2)
Prothrombin Time: 15.6 s — ABNORMAL HIGH (ref 11.4–15.2)

## 2024-05-22 LAB — COMPREHENSIVE METABOLIC PANEL WITH GFR
ALT: 15 U/L (ref 0–44)
AST: 17 U/L (ref 15–41)
Albumin: 2.9 g/dL — ABNORMAL LOW (ref 3.5–5.0)
Alkaline Phosphatase: 56 U/L (ref 38–126)
Anion gap: 8 (ref 5–15)
BUN: 7 mg/dL (ref 6–20)
CO2: 25 mmol/L (ref 22–32)
Calcium: 9.2 mg/dL (ref 8.9–10.3)
Chloride: 106 mmol/L (ref 98–111)
Creatinine, Ser: 0.65 mg/dL (ref 0.44–1.00)
GFR, Estimated: >60 mL/min (ref >60)
Glucose, Bld: 86 mg/dL (ref 70–99)
Potassium: 3.7 mmol/L (ref 3.5–5.1)
Sodium: 139 mmol/L (ref 135–145)
Total Bilirubin: 1.4 mg/dL — ABNORMAL HIGH (ref 0.0–1.2)
Total Protein: 6.9 g/dL (ref 6.5–8.1)

## 2024-05-22 MED ORDER — VANCOMYCIN HCL 1000 MG/200ML IV SOLN
1000.0000 mg | Freq: Two times a day (BID) | INTRAVENOUS | Status: DC
  Administered 2024-05-23: 1000 mg via INTRAVENOUS
  Filled 2024-05-22 (×2): qty 200

## 2024-05-22 MED ORDER — HYDROCODONE-ACETAMINOPHEN 5-325 MG PO TABS
1.0000 | ORAL_TABLET | Freq: Four times a day (QID) | ORAL | Status: DC | PRN
  Administered 2024-05-22 – 2024-05-23 (×3): 1 via ORAL
  Filled 2024-05-22 (×3): qty 1

## 2024-05-22 NOTE — Plan of Care (Signed)
   Problem: Education: Goal: Knowledge of General Education information will improve Description Including pain rating scale, medication(s)/side effects and non-pharmacologic comfort measures Outcome: Progressing

## 2024-05-22 NOTE — Plan of Care (Signed)
   Problem: Fluid Volume: Goal: Hemodynamic stability will improve Outcome: Progressing   Problem: Clinical Measurements: Goal: Diagnostic test results will improve Outcome: Progressing

## 2024-05-22 NOTE — Progress Notes (Signed)
 Pharmacy Antibiotic Note  Regina Carpenter is a 48 y.o. female admitted on 05/21/2024 with sepsis.  Patient has PMH of MRSA infections, dry gangrene, and PVD. Pharmacy has been consulted for Vancomycin  dosing. Patient is also receiving Cefepime .   Scr: 0.89>>0.65  Plan: Will adjust Vancomycin  to 1000 mg IV Q 12 hrs. Goal AUC 400-550. Expected AUC: 511 SCr used: 0.8 Vd: 0.5   Height: 5' 5 (165.1 cm) Weight: 114 kg (251 lb 5.2 oz) IBW/kg (Calculated) : 57  Temp (24hrs), Avg:98.7 F (37.1 C), Min:98.5 F (36.9 C), Max:98.9 F (37.2 C)  Recent Labs  Lab 05/20/24 2238 05/21/24 0256 05/22/24 0548  WBC 10.9*  --  7.5  CREATININE 0.89  --  0.65  LATICACIDVEN 2.6* 1.7  --     Estimated Creatinine Clearance: 108.3 mL/min (by C-G formula based on SCr of 0.65 mg/dL).    Allergies  Allergen Reactions   Ibuprofen Other (See Comments)    Pt states that she sweats profusely after taking ibuprofen Other reaction(s): Other (See Comments) sweating:   Sulfa  Antibiotics Rash   Sulfasalazine Rash    Antimicrobials this admission: 8/18 Cefepime   >>  8/18 Vancomycin  >>   Dose adjustments this admission:   Microbiology results: 8/18  BCx: NDTD    Thank you for allowing pharmacy to be a part of this patient's care.  Estill CHRISTELLA Lutes, PharmD, BCPS Clinical Pharmacist 05/22/2024 8:54 AM

## 2024-05-22 NOTE — Progress Notes (Signed)
 Progress Note    Regina Carpenter  FMW:[id number] DOB: [birth year]  DOA: 05/21/2024 PCP: [facility]      Brief Narrative:    Medical records reviewed and are as summarized below:  Regina Carpenter is a 48 y.o. female with medical history significant for rheumatoid arthritis, chronic pain syndrome, peripheral vascular disease, MRSA infections and dry gangrene, PE on anticoagulation, right transmetatarsal amputation, who presented to the hospital because of painful, swelling and redness of the right foot stump.  Symptoms have progressively worsened.  She was on Eliquis  and prednisone  for PE and rheumatoid arthritis respectively but had not taken these medicines for about 3 weeks prior to admission.      Assessment/Plan:   Principal Problem:   Sepsis (HCC) Active Problems:   Peripheral vascular disease (HCC)   Phantom limb syndrome with pain (HCC) (Right)   Rheumatoid arthritis (HCC)   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HCC)   Essential hypertension   Cellulitis    Body mass index is 41.82 kg/m.  (Morbid obesity)    Severe sepsis secondary to right foot cellulitis in an immunocompromised patient: Continue IV vancomycin  and cefepime .  Analgesics as needed for pain.  Follow-up blood cultures. Lactic acid improved from 2.6-1.7.    Rheumatoid arthritis, PVD, cutaneous polyarteritis nodosa, history of mesenteric adenitis: Continue hydroxychloroquine . Continue prednisone .  Patient said prednisone  had been tapered down to 5 mg daily. She said she had not taken prednisone  for about 3 weeks prior to admission because she had run out of prednisone .    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  Recommended medical adherence to prednisone .   History of DVT: Continue Eliquis    The importance of medical adherence was reiterated.    Diet Order             Diet regular Room service appropriate? Yes; Fluid consistency: Thin  Diet  effective now                                  Consultants: None   Procedures: None    Medications:    apixaban   5 mg Oral BID   colchicine   0.6 mg Oral Daily   gabapentin   300 mg Oral TID   hydroxychloroquine   200 mg Oral BID   predniSONE   20 mg Oral Q breakfast   venlafaxine  XR  75 mg Oral Daily   Continuous Infusions:  ceFEPime  (MAXIPIME ) IV 2 g (05/22/24 1150)   [START ON 05/23/2024] vancomycin        Anti-infectives (From admission, onward)    Start     Dose/Rate Route Frequency Ordered Stop   05/23/24 0500  vancomycin  (VANCOREADY) IVPB 1000 mg/200 mL        1,000 mg 200 mL/hr over 60 Minutes Intravenous Every 12 hours 05/22/24 0855     05/22/24 0400  vancomycin  (VANCOREADY) IVPB 1750 mg/350 mL  Status:  Discontinued        1,750 mg 175 mL/hr over 120 Minutes Intravenous Every 24 hours 05/21/24 0451 05/22/24 0855   05/21/24 1200  ceFEPIme  (MAXIPIME ) 2 g in sodium chloride  0.9 % 100 mL IVPB        2 g 200 mL/hr over 30 Minutes Intravenous Every 8 hours 05/21/24 1108     05/21/24 1100  hydroxychloroquine  (PLAQUENIL ) tablet 200 mg        200 mg Oral 2 times daily 05/21/24 1042  05/21/24 1000  cefTRIAXone  (ROCEPHIN ) 1 g in sodium chloride  0.9 % 100 mL IVPB  Status:  Discontinued        1 g 200 mL/hr over 30 Minutes Intravenous Every 24 hours 05/21/24 0445 05/21/24 1108   05/21/24 0300  vancomycin  (VANCOCIN ) IVPB 1000 mg/200 mL premix       Placed in Followed by Linked Group   1,000 mg 200 mL/hr over 60 Minutes Intravenous  Once 05/21/24 0246 05/21/24 0459   05/21/24 0300  vancomycin  (VANCOREADY) IVPB 1500 mg/300 mL       Placed in Followed by Linked Group   1,500 mg 150 mL/hr over 120 Minutes Intravenous  Once 05/21/24 0246 05/21/24 0717   05/21/24 0245  ceFEPIme  (MAXIPIME ) 2 g in sodium chloride  0.9 % 100 mL IVPB        2 g 200 mL/hr over 30 Minutes Intravenous  Once 05/21/24 0241 05/21/24 0347              Family  Communication/Anticipated D/C date and plan/Code Status   DVT prophylaxis: SCDs Start: 05/21/24 1045 apixaban  (ELIQUIS ) tablet 5 mg     Code Status: Full Code  Family Communication: None Disposition Plan: Plan to discharge home   Status is: Observation The patient will require care spanning > 2 midnights and should be moved to inpatient because: Sepsis secondary to right foot cellulitis       Subjective:   Interval events noted.  Pain in the right foot is improving.  Objective:    Vitals:   05/21/24 1439 05/21/24 2008 05/22/24 0418 05/22/24 0752  BP: 93/62 (!) 133/91 119/75 116/63  Pulse:  83 (!) 101 80  Resp: 18 18 17 16   Temp: 98.9 F (37.2 C) 98.5 F (36.9 C) 98.5 F (36.9 C) 98.9 F (37.2 C)  TempSrc:  Oral Oral Oral  SpO2: 98% 99% 97% 98%  Weight:      Height:       No data found.   Intake/Output Summary (Last 24 hours) at 05/22/2024 1359 Last data filed at 05/22/2024 1049 Gross per 24 hour  Intake 889.99 ml  Output --  Net 889.99 ml   Filed Weights   05/21/24 0535  Weight: 114 kg    Exam:  GEN: NAD SKIN: Warm and dry EYES: EOMI ENT: MMM CV: RRR PULM: CTA B ABD: soft, ND, NT, +BS CNS: AAO x 3, non focal EXT: Right TMA amputee.  Tender punctate small open wound on the first metatarsal stump area.  Swelling and redness of the right foot has improved.        Data Reviewed:   I have personally reviewed following labs and imaging studies:  Labs: Labs show the following:   Basic Metabolic Panel: Recent Labs  Lab 05/20/24 2238 05/22/24 0548  NA 139 139  K 3.6 3.7  CL 103 106  CO2 24 25  GLUCOSE 136* 86  BUN 9 7  CREATININE 0.89 0.65  CALCIUM 9.3 9.2   GFR Estimated Creatinine Clearance: 108.3 mL/min (by C-G formula based on SCr of 0.65 mg/dL). Liver Function Tests: Recent Labs  Lab 05/20/24 2238 05/22/24 0548  AST 24 17  ALT 18 15  ALKPHOS 74 56  BILITOT 0.6 1.4*  PROT 8.0 6.9  ALBUMIN 3.8 2.9*   No results for  input(s): LIPASE, AMYLASE in the last 168 hours. No results for input(s): AMMONIA in the last 168 hours. Coagulation profile Recent Labs  Lab 05/21/24 0550 05/22/24 0548  INR 1.1  1.2    CBC: Recent Labs  Lab 05/20/24 2238 05/22/24 0548  WBC 10.9* 7.5  NEUTROABS 5.6  --   HGB 12.5 11.9*  HCT 38.7 36.3  MCV 91.7 90.8  PLT 255 228   Cardiac Enzymes: No results for input(s): CKTOTAL, CKMB, CKMBINDEX, TROPONINI in the last 168 hours. BNP (last 3 results) No results for input(s): PROBNP in the last 8760 hours. CBG: No results for input(s): GLUCAP in the last 168 hours. D-Dimer: No results for input(s): DDIMER in the last 72 hours. Hgb A1c: No results for input(s): HGBA1C in the last 72 hours. Lipid Profile: No results for input(s): CHOL, HDL, LDLCALC, TRIG, CHOLHDL, LDLDIRECT in the last 72 hours. Thyroid function studies: No results for input(s): TSH, T4TOTAL, T3FREE, THYROIDAB in the last 72 hours.  Invalid input(s): FREET3 Anemia work up: No results for input(s): VITAMINB12, FOLATE, FERRITIN, TIBC, IRON, RETICCTPCT in the last 72 hours. Sepsis Labs: Recent Labs  Lab 05/20/24 2238 05/21/24 0256 05/22/24 0548  WBC 10.9*  --  7.5  LATICACIDVEN 2.6* 1.7  --     Microbiology Recent Results (from the past 240 hours)  Blood Culture (routine x 2)     Status: None (Preliminary result)   Collection Time: 05/21/24  2:50 AM   Specimen: BLOOD RIGHT ARM  Result Value Ref Range Status   Specimen Description BLOOD RIGHT ARM  Final   Special Requests   Final    BOTTLES DRAWN AEROBIC AND ANAEROBIC Blood Culture results may not be optimal due to an inadequate volume of blood received in culture bottles   Culture   Final    NO GROWTH 1 DAY Performed at [facility], [address] [place], [place] [address]    Report Status PENDING  Incomplete  Blood Culture (routine x 2)     Status: None (Preliminary result)    Collection Time: 05/21/24  2:58 AM   Specimen: BLOOD RIGHT ARM  Result Value Ref Range Status   Specimen Description BLOOD RIGHT ARM  Final   Special Requests   Final    BOTTLES DRAWN AEROBIC AND ANAEROBIC Blood Culture results may not be optimal due to an inadequate volume of blood received in culture bottles   Culture   Final    NO GROWTH 1 DAY Performed at [facility], [address] [place], [place] [address]    Report Status PENDING  Incomplete    Procedures and diagnostic studies:  DG Foot Complete Right Result Date: 05/21/2024 CLINICAL DATA:  Redness, warmth, swelling, pain EXAM: RIGHT FOOT COMPLETE - 3+ VIEW COMPARISON:  07/17/2020 FINDINGS: Changes of prior transmetatarsal amputation of all 5 toes. No bone destruction to suggest acute osteomyelitis. No acute bony abnormality. Specifically, no fracture, subluxation, or dislocation. No soft tissue gas. IMPRESSION: No acute bony abnormality. Electronically Signed   By: Franky Crease M.D.   On: 05/21/2024 03:25   US  Venous Img Lower Unilateral Right Result Date: 05/21/2024 CLINICAL DATA:  Right lower extremity pain, redness EXAM: RIGHT LOWER EXTREMITY VENOUS DOPPLER ULTRASOUND TECHNIQUE: Gray-scale sonography with compression, as well as color and duplex ultrasound, were performed to evaluate the deep venous system(s) from the level of the common femoral vein through the popliteal and proximal calf veins. COMPARISON:  01/06/2024 FINDINGS: VENOUS Normal compressibility of the common femoral, superficial femoral, and popliteal veins, as well as the visualized calf veins. Visualized portions of profunda femoral vein and great saphenous vein unremarkable. No filling defects to suggest DVT on grayscale  or color Doppler imaging. Doppler waveforms show normal direction of venous flow, normal respiratory plasticity and response to augmentation. Limited views of the contralateral common femoral vein are unremarkable. OTHER None.  Limitations: none IMPRESSION: Negative. Electronically Signed   By: Franky Crease M.D.   On: 05/21/2024 03:05               LOS: 1 day   Indiah Heyden  [facility]   Pager on [email or web address]. If 7PM-7AM, please contact night-coverage at [email or web address]     05/22/2024, 1:59 PM

## 2024-05-23 ENCOUNTER — Encounter: Payer: Self-pay | Admitting: Oncology

## 2024-05-23 ENCOUNTER — Other Ambulatory Visit: Payer: Self-pay

## 2024-05-23 DIAGNOSIS — A419 Sepsis, unspecified organism: Secondary | ICD-10-CM | POA: Diagnosis not present

## 2024-05-23 DIAGNOSIS — I10 Essential (primary) hypertension: Secondary | ICD-10-CM

## 2024-05-23 DIAGNOSIS — M318 Other specified necrotizing vasculopathies: Secondary | ICD-10-CM

## 2024-05-23 DIAGNOSIS — L03115 Cellulitis of right lower limb: Secondary | ICD-10-CM

## 2024-05-23 DIAGNOSIS — I739 Peripheral vascular disease, unspecified: Secondary | ICD-10-CM

## 2024-05-23 DIAGNOSIS — M069 Rheumatoid arthritis, unspecified: Secondary | ICD-10-CM

## 2024-05-23 DIAGNOSIS — G546 Phantom limb syndrome with pain: Secondary | ICD-10-CM

## 2024-05-23 MED ORDER — PREDNISONE 10 MG PO TABS
10.0000 mg | ORAL_TABLET | Freq: Every day | ORAL | 0 refills | Status: AC
  Filled 2024-05-23: qty 30, 30d supply, fill #0

## 2024-05-23 MED ORDER — DOXYCYCLINE HYCLATE 100 MG PO TABS: 100.0000 mg | ORAL_TABLET | Freq: Two times a day (BID) | ORAL | 0 refills | Status: AC

## 2024-05-23 MED ORDER — AMOXICILLIN-POT CLAVULANATE 875-125 MG PO TABS: 1.0000 | ORAL_TABLET | Freq: Two times a day (BID) | ORAL | 0 refills | Status: AC

## 2024-05-23 NOTE — Plan of Care (Signed)

## 2024-05-23 NOTE — Discharge Summary (Addendum)
 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 female with medical history significant for rheumatoid arthritis, chronic pain syndrome, peripheral vascular disease, MRSA infections and dry gangrene, PE on anticoagulation, right transmetatarsal amputation, who presented to the hospital because of painful, swelling and redness of the right foot stump.  Symptoms have progressively worsened.  She was on Eliquis  and prednisone  for PE and rheumatoid arthritis respectively but had not taken these medicines for about 3 weeks prior to admission.   Patient met severe sepsis criteria with leukocytosis and mild tachycardia and mild lactic acidosis which resolved.  Likely secondary to right foot stump cellulitis, patient received cefepime  and vancomycin  while in the hospital and is being discharged on Augmentin  and doxycycline .  Stamper looks fine with no discharge and no obvious edema or erythema on the day of discharge.  Blood cultures remain negative.  Patient has significant other comorbidities which include rheumatoid arthritis, PVD, cutaneous polyarteritis nodosa and mesenteric adenitis.  She will continue with her outpatient follow-up and medications.  Patient was found to have secondary adrenal insufficiency and cortisol levels were 2.1, likely due to the prednisone  use as she was stopped taking  her home prednisone , she should continue with her prednisone  and follow-up with a rheumatologist for further assistance.  Patient will continue with her home medications and follow-up with her providers for further assistance.   Consultants: None Procedures performed: None Disposition: Home Diet recommendation:  Discharge Diet Orders (From admission, onward)     Start     Ordered   05/23/24 0000  Diet - low sodium heart healthy        05/23/24 1102           Cardiac diet DISCHARGE MEDICATION: Allergies as of 05/23/2024       Reactions   Ibuprofen Other (See Comments)   Pt states that she sweats profusely after taking ibuprofen Other reaction(s): Other (See Comments) sweating:   Sulfa  Antibiotics Rash   Sulfasalazine Rash        Medication List     STOP taking these medications    gabapentin  600 MG tablet Commonly known as: NEURONTIN    HYDROcodone -acetaminophen  5-325 MG tablet Commonly known as: NORCO/VICODIN   oxyCODONE -acetaminophen  5-325 MG tablet Commonly known as: PERCOCET/ROXICET       TAKE these medications    acetaminophen  500 MG tablet Commonly known as: TYLENOL  Take 1,000 mg by mouth daily.   amoxicillin -clavulanate 875-125 MG tablet Commonly known as: AUGMENTIN  Take 1 tablet by mouth 2 (two) times daily for 3 days.   colchicine  0.6 MG tablet Take 0.6 mg by mouth daily.   doxycycline  100 MG tablet Commonly known as: VIBRA -TABS Take 1 tablet (100 mg total) by mouth 2 (two) times daily for 3 days.   Eliquis  5 MG Tabs tablet Generic drug: apixaban  Take 5 mg by mouth 2 (two) times daily.   hydroxychloroquine  200 MG tablet Commonly known  as: PLAQUENIL  Take 200 mg by mouth 2 (two) times daily.   methotrexate  2.5 MG tablet Commonly known as: RHEUMATREX Take 20 mg by mouth once a week.   predniSONE  10 MG tablet Commonly known as: DELTASONE  Take 1 tablet (10 mg total) by mouth daily with breakfast. Start taking on: May 24, 2024 What changed:  medication strength how much to take how to take this when to take this additional instructions   pregabalin 50 MG capsule Commonly known as: LYRICA Take 50 mg by mouth 2 (two) times daily.   saccharomyces boulardii 250 MG capsule Commonly known as: FLORASTOR Take 1 capsule (250 mg total) by mouth 2 (two) times daily.   venlafaxine  XR 75 MG 24 hr capsule Commonly known as: EFFEXOR -XR Take 75 mg by mouth daily.        Follow-up Information     [facility] Go on 05/30/2024.   Why: Appt @ 10:00 am w/ Lamar Manus, PA-C. Contact information: [address] [place] [place] [address] [phone]                Discharge Exam: [name]   05/21/24 0535  Weight: 114 kg   General.  Morbidly obese lady, in no acute distress. Pulmonary.  Lungs clear bilaterally, normal respiratory effort. CV.  Regular rate and rhythm, no JVD, rub or murmur. Abdomen.  Soft, nontender, nondistended, BS positive. CNS.  Alert and oriented .  No focal neurologic deficit. Extremities.  No edema, no cyanosis, pulses intact and symmetrical.  Right foot stamper which looks well-healed Psychiatry.  Judgment and insight appears normal.   Condition at discharge: stable  The results of significant diagnostics from this hospitalization (including imaging, microbiology, ancillary and laboratory) are listed below for reference.   Imaging Studies: DG Foot Complete Right Result Date: 05/21/2024 CLINICAL DATA:  Redness, warmth, swelling, pain EXAM: RIGHT FOOT COMPLETE - 3+ VIEW COMPARISON:  07/17/2020 FINDINGS: Changes of prior transmetatarsal amputation of all 5 toes. No bone destruction to suggest acute osteomyelitis. No acute bony abnormality. Specifically, no fracture, subluxation, or dislocation. No soft tissue gas. IMPRESSION: No acute bony abnormality. Electronically Signed   By: Franky Crease M.D.   On: 05/21/2024 03:25   US  Venous Img Lower Unilateral  Right Result Date: 05/21/2024 CLINICAL DATA:  Right lower extremity pain, redness EXAM: RIGHT LOWER EXTREMITY VENOUS DOPPLER ULTRASOUND TECHNIQUE: Gray-scale sonography with compression, as well as color and duplex ultrasound, were performed to evaluate the deep venous system(s) from the level of the common femoral vein through the popliteal and proximal calf veins. COMPARISON:  01/06/2024 FINDINGS: VENOUS Normal compressibility of the common femoral, superficial femoral, and popliteal veins, as well as the visualized calf veins. Visualized portions of profunda femoral vein and great saphenous vein unremarkable. No filling defects to suggest DVT on grayscale or color Doppler imaging. Doppler waveforms show normal direction of venous flow, normal respiratory plasticity and response to augmentation. Limited views of the contralateral common femoral vein are unremarkable. OTHER None. Limitations: none IMPRESSION: Negative. Electronically Signed   By: Franky Crease M.D.   On: 05/21/2024 03:05   DG Elbow Complete Left Result Date: 05/01/2024 CLINICAL DATA:  pain and swelling EXAM: LEFT ELBOW - COMPLETE 3+ VIEW; LEFT WRIST - COMPLETE 3+ VIEW COMPARISON:  None Available. FINDINGS: Left elbow No acute fracture or dislocation. No joint effusion. There is no evidence of arthropathy or other focal bone abnormality. Soft tissues are unremarkable. Left wrist No acute fracture or dislocation. There is  no evidence of arthropathy or other focal bone abnormality. Soft tissues are unremarkable. IMPRESSION: No acute fracture or dislocation in the left elbow and left wrist. Electronically Signed   By: Rogelia Myers M.D.   On: 05/01/2024 08:43   DG Wrist Complete Left Result Date: 05/01/2024 CLINICAL DATA:  pain and swelling EXAM: LEFT ELBOW - COMPLETE 3+ VIEW; LEFT WRIST - COMPLETE 3+ VIEW COMPARISON:  None Available. FINDINGS: Left elbow No acute fracture or dislocation. No joint effusion. There is no evidence of arthropathy  or other focal bone abnormality. Soft tissues are unremarkable. Left wrist No acute fracture or dislocation. There is no evidence of arthropathy or other focal bone abnormality. Soft tissues are unremarkable. IMPRESSION: No acute fracture or dislocation in the left elbow and left wrist. Electronically Signed   By: Rogelia Myers M.D.   On: 05/01/2024 08:43    Microbiology: Results for orders placed or performed during the hospital encounter of 05/21/24  Blood Culture (routine x 2)     Status: None (Preliminary result)   Collection Time: 05/21/24  2:50 AM   Specimen: BLOOD RIGHT ARM  Result Value Ref Range Status   Specimen Description BLOOD RIGHT ARM  Final   Special Requests   Final    BOTTLES DRAWN AEROBIC AND ANAEROBIC Blood Culture results may not be optimal due to an inadequate volume of blood received in culture bottles   Culture   Final    NO GROWTH 2 DAYS Performed at [facility], [address] [place], [place] [address]    Report Status PENDING  Incomplete  Blood Culture (routine x 2)     Status: None (Preliminary result)   Collection Time: 05/21/24  2:58 AM   Specimen: BLOOD RIGHT ARM  Result Value Ref Range Status   Specimen Description BLOOD RIGHT ARM  Final   Special Requests   Final    BOTTLES DRAWN AEROBIC AND ANAEROBIC Blood Culture results may not be optimal due to an inadequate volume of blood received in culture bottles   Culture   Final    NO GROWTH 2 DAYS Performed at [facility], [address] [place], [place] [address]    Report Status PENDING  Incomplete    Labs: CBC: Recent Labs  Lab 05/20/24 2238 05/22/24 0548  WBC 10.9* 7.5  NEUTROABS 5.6  --   HGB 12.5 11.9*  HCT 38.7 36.3  MCV 91.7 90.8  PLT 255 228   Basic Metabolic Panel: Recent Labs  Lab 05/20/24 2238 05/22/24 0548  NA 139 139  K 3.6 3.7  CL 103 106  CO2 24 25  GLUCOSE 136* 86  BUN 9 7  CREATININE 0.89 0.65  CALCIUM 9.3 9.2   Liver Function  Tests: Recent Labs  Lab 05/20/24 2238 05/22/24 0548  AST 24 17  ALT 18 15  ALKPHOS 74 56  BILITOT 0.6 1.4*  PROT 8.0 6.9  ALBUMIN 3.8 2.9*   CBG: No results for input(s): GLUCAP in the last 168 hours.  Discharge time spent: greater than 30 minutes.  This record has been created using Conservation officer, historic buildings. Errors have been sought and corrected,but may not always be located. Such creation errors do not reflect on the standard of care.   Signed: Amaryllis Dare, MD [facility] 05/23/2024

## 2024-05-23 NOTE — Plan of Care (Signed)
  Problem: Clinical Measurements: Goal: Diagnostic test results will improve Outcome: Progressing   Problem: Respiratory: Goal: Ability to maintain adequate ventilation will improve Outcome: Progressing   Problem: Pain Managment: Goal: General experience of comfort will improve and/or be controlled Outcome: Progressing   Problem: Skin Integrity: Goal: Skin integrity will improve Outcome: Progressing

## 2024-05-26 LAB — CULTURE, BLOOD (ROUTINE X 2)
Culture: NO GROWTH
Culture: NO GROWTH

## 2024-06-01 ENCOUNTER — Other Ambulatory Visit: Payer: Self-pay

## 2024-06-05 ENCOUNTER — Other Ambulatory Visit: Payer: Self-pay

## 2024-06-19 NOTE — Progress Notes (Signed)
 Chief Complaint:   Chief Complaint  Patient presents with  . Left Foot - Pain    Whole foot hurts     Subjective  HPI  Regina Carpenter is a 48 y.o. female who presents for Pain of the Left Foot (Whole foot hurts ) History of Present Illness Regina Carpenter is a 48 year old female with rheumatoid arthritis who presents with chronic foot and ankle pain.  She experiences chronic pain in her feet and ankles, described as a persistent ache with sharp pains in her legs when walking. This pain leads to fatigue from walking. She has not previously sought treatment for this pain or discussed it with her arthritis doctors.  She is currently on Plaquenil  for rheumatoid arthritis. She has not tried specific treatments for her foot and ankle pain and mentions that she stopped visiting doctors for a while due to feeling overwhelmed.  She has a history of skin ulcers but currently has no open wounds or sores on her feet. She monitors her feet closely, washing them with antibacterial soap, and notes some discolored areas that turn into small spots.  She has a history of polyarteritis nodosa. She recently stopped smoking.  Review of Systems  Patient Active Problem List  Diagnosis  . Bilateral lower leg cellulitis  . Tobacco use  . Right hand pain  . Arthralgia of right knee  . Lymphadenopathy, mesenteric  . Open wound of both lower extremities with complication  . Vasculitis ()  . Inflammatory polyarthritis (CMS/HHS-HCC)  . Rheumatoid arthritis (CMS/HHS-HCC)  . Scleritis and episcleritis of right eye  . PE (pulmonary thromboembolism) (CMS/HHS-HCC)  . 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 Hypokalemia  . Hyponatremia  . Chronic pain  . Generalized pain  . Essential hypertension  . Chronic anemia  . Cutaneous polyarteritis nodosa (CMS/HHS-HCC)  . Infection of wound due to methicillin resistant Staphylococcus aureus (MRSA)    Outpatient Medications Prior to Visit  Medication  Sig Dispense Refill  . acetaminophen  (TYLENOL ) 325 MG tablet Take 2 tablets (650 mg total) by mouth every 4 (four) hours as needed for Pain    . colchicine  (COLCRYS ) 0.6 mg tablet Take 1 tablet (0.6 mg total) by mouth once daily 90 tablet 1  . ELIQUIS  5 mg tablet TAKE 1 TABLET BY MOUTH TWICE DAILY . APPOINTMENT REQUIRED FOR FUTURE REFILLS 60 tablet 0  . gabapentin  (NEURONTIN ) 300 MG capsule Take 2 capsules (600 mg total) by mouth 3 (three) times daily    . hydroxychloroquine  (PLAQUENIL ) 200 mg tablet Take 1 tablet (200 mg total) by mouth 2 (two) times daily 60 tablet 5  . methotrexate  (RHEUMATREX) 2.5 MG tablet Take 8 tablets (20 mg total) by mouth every 7 (seven) days 32 tablet 5  . predniSONE  (DELTASONE ) 10 MG tablet Prednisone  40 mg/day x 2 weeks; 30 mg/day x 2 weeks; 20 mg/day till seen in clinic 120 tablet 2  . pregabalin (LYRICA) 50 MG capsule Take 1 capsule (50 mg total) by mouth 2 (two) times daily 60 capsule 0  . venlafaxine  (EFFEXOR -XR) 75 MG XR capsule Take 1 capsule by mouth once daily 90 capsule 0   No facility-administered medications prior to visit.      Objective  Vitals:   06/19/24 1208  BP: (!) 138/94  Pulse: 70  PainSc:   9  PainLoc: Foot   There is no height or weight on file to calculate BMI.  Home Vitals:     Physical Exam EXTREMITIES: No open  wound, sore, drainage, or infection on the toe. Toe is inflamed with discolored areas, likely due to arthritis.  General/Constitutional: No apparent distress: well-nourished and well developed.  Vascular:Left foot:  Dorsalis Pedis:  present Posterior Tibial:  present      Right foot:  Dorsalis Pedis:  present Posterior Tibial:  present  Neuro: Full sensation to light touch, vibratory, protective sensation  Derm: Just mild erythema at the distal fourth toe.  No open wounds or ulcerations.  No signs of infection.  Ortho/MS: Right foot status post transmetatarsal amputation.  Also status post left fifth toe  amputation.  Mild edema.   The following labs were personally reviewed: - Lab Results  Component Value Date   WBC 9.6 12/20/2022   HGB 13.9 12/20/2022   HCT 41.8 12/20/2022   PLT 253 12/20/2022   - Lab Results  Component Value Date   NA 136 12/20/2022   K 3.0 (L) 12/20/2022   CL 101 12/20/2022   CO2 25.7 12/20/2022   BUN 12 12/20/2022   CREATININE 0.9 12/20/2022   GLUCOSE 112 (H) 12/20/2022   - Lab Results  Component Value Date   NA 136 12/20/2022   K 3.0 (L) 12/20/2022   CL 101 12/20/2022   CO2 25.7 12/20/2022   BUN 12 12/20/2022   CREATININE 0.9 12/20/2022   CALCIUM 9.6 12/20/2022   TP 8.3 (H) 09/15/2012   ALB 4.5 12/20/2022   TBILI 0.9 12/20/2022   ALKPHOS 55 12/20/2022   AST 20 12/20/2022   ALT 17 12/20/2022   GLUCOSE 112 (H) 12/20/2022   GFR 79 12/20/2022   - Lab Results  Component Value Date   HGBA1C 5.7 (H) 09/08/2022      Results RADIOLOGY Left toe X-ray: No fractures, no breaks, no erosions     Assessment/Plan:   Assessment & Plan Chronic foot and ankle pain Chronic pain in feet and ankles, likely related to underlying rheumatoid arthritis and polyarteritis nodosa. Pain is described as aching and sharp, exacerbated by walking. No prior treatment sought for this specific issue. Smoking cessation may improve pain by enhancing microcirculation. - Discuss chronic pain management with Dr. Tobie - Consider referral to a chronic pain management specialist - Encourage smoking cessation to improve microcirculation and reduce pain  Inflammation of left toe Inflammation in the left toe, with no open wound, sore, or infection. X-ray shows no cracks, breaks, or erosions. Inflammation likely due to underlying arthritis affecting microcirculation. No immediate intervention required unless microcirculation issues worsen. - Keep the toe protected and pressure off - Maintain hygiene with antibacterial soap - Consider referral to a vascular surgeon if  microcirculation issues worsen  Rheumatoid arthritis and polyarteritis nodosa affecting lower extremities Rheumatoid arthritis and polyarteritis nodosa affecting microcirculation in the lower extremities, contributing to chronic pain and inflammation. Currently managed with Plaquenil . - Continue Plaquenil  as prescribed  Diagnoses and all orders for this visit:  Cutaneous polyarteritis nodosa (CMS/HHS-HCC)  Rheumatoid arthritis of multiple sites with negative rheumatoid factor (CMS/HHS-HCC)  History of transmetatarsal amputation of right foot (CMS/HHS-HCC)          Future Appointments     Date/Time Provider Department Center Visit Type   07/25/2024 10:45 AM Tobie Lady Plumb, MD [facility] C RETURN VISIT   01/15/2025 10:15 AM Sherial Bail, MD [facility] C PHYSICAL       There are no Patient Instructions on file for this visit.   This note has been created using automated tools and reviewed for  accuracy by JUSTIN Surace FOWLER.

## 2024-07-17 ENCOUNTER — Emergency Department

## 2024-07-17 ENCOUNTER — Other Ambulatory Visit: Payer: Self-pay

## 2024-07-17 ENCOUNTER — Encounter: Payer: Self-pay | Admitting: Emergency Medicine

## 2024-07-17 ENCOUNTER — Inpatient Hospital Stay
Admission: EM | Admit: 2024-07-17 | Discharge: 2024-07-19 | DRG: 603 | Disposition: A | Discharge status: Home / Self Care | Attending: Internal Medicine | Admitting: Internal Medicine

## 2024-07-17 DIAGNOSIS — L03116 Cellulitis of left lower limb: Secondary | ICD-10-CM | POA: Diagnosis present

## 2024-07-17 DIAGNOSIS — Z7901 Long term (current) use of anticoagulants: Secondary | ICD-10-CM

## 2024-07-17 DIAGNOSIS — I10 Essential (primary) hypertension: Secondary | ICD-10-CM | POA: Diagnosis present

## 2024-07-17 DIAGNOSIS — Z8679 Personal history of other diseases of the circulatory system: Secondary | ICD-10-CM | POA: Diagnosis not present

## 2024-07-17 DIAGNOSIS — L97529 Non-pressure chronic ulcer of other part of left foot with unspecified severity: Secondary | ICD-10-CM | POA: Diagnosis not present

## 2024-07-17 DIAGNOSIS — Z86711 Personal history of pulmonary embolism: Secondary | ICD-10-CM

## 2024-07-17 DIAGNOSIS — Z59868 Other specified financial insecurity: Secondary | ICD-10-CM

## 2024-07-17 DIAGNOSIS — G894 Chronic pain syndrome: Secondary | ICD-10-CM | POA: Diagnosis present

## 2024-07-17 DIAGNOSIS — F17210 Nicotine dependence, cigarettes, uncomplicated: Secondary | ICD-10-CM | POA: Diagnosis present

## 2024-07-17 DIAGNOSIS — Z888 Allergy status to other drugs, medicaments and biological substances status: Secondary | ICD-10-CM | POA: Diagnosis not present

## 2024-07-17 DIAGNOSIS — M069 Rheumatoid arthritis, unspecified: Secondary | ICD-10-CM | POA: Diagnosis present

## 2024-07-17 DIAGNOSIS — Z811 Family history of alcohol abuse and dependence: Secondary | ICD-10-CM

## 2024-07-17 DIAGNOSIS — Z806 Family history of leukemia: Secondary | ICD-10-CM | POA: Diagnosis not present

## 2024-07-17 DIAGNOSIS — I878 Other specified disorders of veins: Secondary | ICD-10-CM | POA: Diagnosis present

## 2024-07-17 DIAGNOSIS — I739 Peripheral vascular disease, unspecified: Secondary | ICD-10-CM | POA: Diagnosis present

## 2024-07-17 DIAGNOSIS — G546 Phantom limb syndrome with pain: Secondary | ICD-10-CM | POA: Diagnosis present

## 2024-07-17 DIAGNOSIS — I776 Arteritis, unspecified: Secondary | ICD-10-CM | POA: Diagnosis present

## 2024-07-17 DIAGNOSIS — Z8614 Personal history of Methicillin resistant Staphylococcus aureus infection: Secondary | ICD-10-CM

## 2024-07-17 DIAGNOSIS — Z882 Allergy status to sulfonamides status: Secondary | ICD-10-CM

## 2024-07-17 LAB — COMPREHENSIVE METABOLIC PANEL WITH GFR
ALT: 20 U/L (ref 0–44)
AST: 19 U/L (ref 15–41)
Albumin: 3.8 g/dL (ref 3.5–5.0)
Alkaline Phosphatase: 66 U/L (ref 38–126)
Anion gap: 11 (ref 5–15)
BUN: 14 mg/dL (ref 6–20)
CO2: 24 mmol/L (ref 22–32)
Calcium: 9.1 mg/dL (ref 8.9–10.3)
Chloride: 105 mmol/L (ref 98–111)
Creatinine, Ser: 0.82 mg/dL (ref 0.44–1.00)
GFR, Estimated: >60 mL/min (ref >60)
Glucose, Bld: 101 mg/dL — ABNORMAL HIGH (ref 70–99)
Potassium: 3.9 mmol/L (ref 3.5–5.1)
Sodium: 140 mmol/L (ref 135–145)
Total Bilirubin: 0.9 mg/dL (ref 0.0–1.2)
Total Protein: 7.4 g/dL (ref 6.5–8.1)

## 2024-07-17 LAB — CBC WITH DIFFERENTIAL/PLATELET
Abs Immature Granulocytes: 0.05 K/uL (ref 0.00–0.07)
Basophils Absolute: 0.1 K/uL (ref 0.0–0.1)
Basophils Relative: 0 %
Eosinophils Absolute: 0.3 K/uL (ref 0.0–0.5)
Eosinophils Relative: 2 %
HCT: 43.2 % (ref 36.0–46.0)
Hemoglobin: 13.3 g/dL (ref 12.0–15.0)
Immature Granulocytes: 0 %
Lymphocytes Relative: 30 %
Lymphs Abs: 3.7 K/uL (ref 0.7–4.0)
MCH: 28.5 pg (ref 26.0–34.0)
MCHC: 30.8 g/dL (ref 30.0–36.0)
MCV: 92.5 fL (ref 80.0–100.0)
Monocytes Absolute: 1 K/uL (ref 0.1–1.0)
Monocytes Relative: 8 %
Neutro Abs: 7 K/uL (ref 1.7–7.7)
Neutrophils Relative %: 60 %
Platelets: 238 K/uL (ref 150–400)
RBC: 4.67 MIL/uL (ref 3.87–5.11)
RDW: 14.8 % (ref 11.5–15.5)
WBC: 12 K/uL — ABNORMAL HIGH (ref 4.0–10.5)
nRBC: 0 % (ref 0.0–0.2)

## 2024-07-17 LAB — LACTIC ACID, PLASMA
Lactic Acid, Venous: 1 mmol/L (ref 0.5–1.9)
Lactic Acid, Venous: 1.1 mmol/L (ref 0.5–1.9)

## 2024-07-17 MED ORDER — PREGABALIN 50 MG PO CAPS
50.0000 mg | ORAL_CAPSULE | Freq: Two times a day (BID) | ORAL | Status: DC
  Administered 2024-07-17 – 2024-07-19 (×4): 50 mg via ORAL
  Filled 2024-07-17 (×4): qty 1

## 2024-07-17 MED ORDER — FENTANYL CITRATE (PF) 50 MCG/ML IJ SOSY
50.0000 ug | PREFILLED_SYRINGE | Freq: Once | INTRAMUSCULAR | Status: AC
  Administered 2024-07-17: 50 ug via INTRAVENOUS
  Filled 2024-07-17: qty 1

## 2024-07-17 MED ORDER — SODIUM CHLORIDE 0.9 % IV SOLN
3.0000 g | Freq: Once | INTRAVENOUS | Status: AC
  Administered 2024-07-17: 3 g via INTRAVENOUS
  Filled 2024-07-17: qty 8

## 2024-07-17 MED ORDER — OXYCODONE HCL 5 MG PO TABS
5.0000 mg | ORAL_TABLET | Freq: Three times a day (TID) | ORAL | Status: DC | PRN
  Administered 2024-07-17 – 2024-07-19 (×4): 5 mg via ORAL
  Filled 2024-07-17 (×4): qty 1

## 2024-07-17 MED ORDER — ACETAMINOPHEN 325 MG PO TABS
650.0000 mg | ORAL_TABLET | Freq: Four times a day (QID) | ORAL | Status: DC | PRN
  Filled 2024-07-17: qty 2

## 2024-07-17 MED ORDER — HYDROXYCHLOROQUINE SULFATE 200 MG PO TABS
200.0000 mg | ORAL_TABLET | Freq: Two times a day (BID) | ORAL | Status: DC
  Administered 2024-07-17 – 2024-07-19 (×4): 200 mg via ORAL
  Filled 2024-07-17 (×4): qty 1

## 2024-07-17 MED ORDER — VENLAFAXINE HCL ER 75 MG PO CP24
75.0000 mg | ORAL_CAPSULE | Freq: Every day | ORAL | Status: DC
  Administered 2024-07-18 – 2024-07-19 (×2): 75 mg via ORAL
  Filled 2024-07-17 (×2): qty 1

## 2024-07-17 MED ORDER — PREDNISONE 10 MG PO TABS
10.0000 mg | ORAL_TABLET | Freq: Every day | ORAL | Status: DC
Start: 2024-07-18 — End: 2024-07-19
  Administered 2024-07-18 – 2024-07-19 (×2): 10 mg via ORAL
  Filled 2024-07-17 (×2): qty 1

## 2024-07-17 MED ORDER — APIXABAN 5 MG PO TABS
5.0000 mg | ORAL_TABLET | Freq: Two times a day (BID) | ORAL | Status: DC
  Administered 2024-07-17 – 2024-07-19 (×4): 5 mg via ORAL
  Filled 2024-07-17 (×4): qty 1

## 2024-07-17 MED ORDER — VANCOMYCIN HCL IN DEXTROSE 1-5 GM/200ML-% IV SOLN
1000.0000 mg | Freq: Once | INTRAVENOUS | Status: AC
  Administered 2024-07-17: 1000 mg via INTRAVENOUS
  Filled 2024-07-17: qty 200

## 2024-07-17 NOTE — H&P (View-Only) (Signed)
 Hospital Consult    Reason for Consult:  Left Foot/toe pain.  Requesting Physician:  Dr Eva Gay MD  MRN #:  [id number]  History of Present Illness: This is a 48 y.o. female with a history of rheumatoid arthritis, venous stasis ulcers of the lower limb, transmetatarsal amputation who presents with complaints of left toe pain redness and swelling and fevers She has a history of peripheral vascular disease, venous stasis has had several amputations already who presents with left fourth toe pain, fever, redness. Her exam is consistent with cellulitis and infection. Patient was seen by Dr Gay about a month ago with similar complaints. At that time noted diffuse darkened discoloration with pain to the area. She states over the last few days the pain has become worse of recent and was brought into the ER due to the worsening pain. She has had previous toe amputations to her 2nd and 5th toe. Vascular Surgery was consulted to evaluate.    Past Medical History:  Diagnosis Date   Arthralgia of right knee    Arthritis    Cellulitis of left leg    Collagen vascular disease    Lymphadenopathy, mesenteric    Right hand pain    Tobacco abuse     Past Surgical History:  Procedure Laterality Date   denies     LOWER EXTREMITY ANGIOGRAPHY Bilateral 05/19/2020   Procedure: Lower Extremity Angiography;  Surgeon: Marea Selinda RAMAN, MD;  Location: [facility] INVASIVE CV LAB;  Service: Cardiovascular;  Laterality: Bilateral;   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   Procedure: TRANSMETATARSAL AMPUTATION;  Surgeon: Neill Boas, DPM;  Location: [facility] ORS;  Service: Podiatry;  Laterality: Right;    Allergies  Allergen Reactions   Ibuprofen Other (See Comments)    Pt states that she sweats profusely after taking ibuprofen Other reaction(s): Other (See Comments) sweating:   Sulfa  Antibiotics Rash   Sulfasalazine Rash    Prior to Admission medications   Medication Sig Start Date End Date Taking?  Authorizing Provider  acetaminophen  (TYLENOL ) 500 MG tablet Take 1,000 mg by mouth daily. 05/09/21   [provider]  colchicine  0.6 MG tablet Take 0.6 mg by mouth daily. 03/02/24   [provider]  ELIQUIS  5 MG TABS tablet Take 5 mg by mouth 2 (two) times daily. 03/25/21   [provider]  hydroxychloroquine  (PLAQUENIL ) 200 MG tablet Take 200 mg by mouth 2 (two) times daily.    [provider]  methotrexate  (RHEUMATREX) 2.5 MG tablet Take 20 mg by mouth once a week.    [provider]  predniSONE  (DELTASONE ) 10 MG tablet Take 1 tablet (10 mg total) by mouth daily with breakfast. 05/24/24   Amin, Sumayya, MD  pregabalin (LYRICA) 50 MG capsule Take 50 mg by mouth 2 (two) times daily. 02/17/24 02/16/25  [provider]  saccharomyces boulardii (FLORASTOR) 250 MG capsule Take 1 capsule (250 mg total) by mouth 2 (two) times daily. Patient not taking: Reported on 05/21/2024 07/25/20   Tobie Yetta HERO, MD  venlafaxine  XR (EFFEXOR -XR) 75 MG 24 hr capsule Take 75 mg by mouth daily.    [provider]    Social History   Socioeconomic History   Marital status: Single    Spouse name: Not on file   Number of children: Not on file   Years of education: Not on file   Highest education level: Not on file  Occupational History   Not on file  Tobacco Use   Smoking status: Every  Day    Current packs/day: 0.50    Types: Cigarettes   Smokeless tobacco: Never  Vaping Use   Vaping status: Never Used  Substance and Sexual Activity   Alcohol use: No   Drug use: No   Sexual activity: Not Currently  Other Topics Concern   Not on file  Social History Narrative   Lives at home with father   Social Drivers of Health   Financial Resource Strain: High Risk (02/15/2024)   Received from [facility]   Overall Financial Resource Strain (CARDIA)    Difficulty of Paying Living Expenses: Very hard  Food Insecurity: No Food Insecurity  (05/21/2024)   Hunger Vital Sign    Worried About Running Out of Food in the Last Year: Never true    Ran Out of Food in the Last Year: Never true  Transportation Needs: No Transportation Needs (05/21/2024)   PRAPARE - Administrator, Civil Service (Medical): No    Lack of Transportation (Non-Medical): No  Physical Activity: Inactive (02/15/2024)   Received from [facility]   Exercise Vital Sign    On average, how many days per week do you engage in moderate to strenuous exercise (like a brisk walk)?: 0 days    On average, how many minutes do you engage in exercise at this level?: 0 min  Stress: Stress Concern Present (02/15/2024)   Received from [facility] Occupational [facility] - Occupational Stress Questionnaire    Feeling of Stress : To some extent  Social Connections: Unknown (02/15/2024)   Received from [facility]   Social Connection and Isolation Panel    In a typical week, how many times do you talk on the phone with family, friends, or neighbors?: Once a week    How often do you get together with friends or relatives?: Patient unable to answer    How often do you attend church or religious services?: Never    Do you belong to any clubs or organizations such as church groups, unions, fraternal or athletic groups, or school groups?: No    How often do you attend meetings of the clubs or organizations you belong to?: Never    Are you married, widowed, divorced, separated, never married, or living with a partner?: Never married  Intimate Partner Violence: Not At Risk (05/21/2024)   Humiliation, Afraid, Rape, and Kick questionnaire    Fear of Current or Ex-Partner: No    Emotionally Abused: No    Physically Abused: No    Sexually Abused: No     Family History  Problem Relation Age of Onset   Leukemia Maternal Grandfather    Alcohol abuse Father     ROS: Otherwise negative unless mentioned in  HPI  Physical Examination  Vitals:   07/17/24 1033 07/17/24 1531  BP: 123/87 122/79  Pulse: (!) 106 84  Resp: 17 16  Temp: 99.6 F (37.6 C) 98.4 F (36.9 C)  SpO2: 100% 99%   Body mass index is 40.94 kg/m.  General:  WDWN in NAD Gait: Not observed HENT: WNL, normocephalic Pulmonary: normal non-labored breathing, without Rales, rhonchi,  wheezing Cardiac: regular, without  Murmurs, rubs or gallops; without carotid bruits Abdomen: Positive bowel sounds throughout,  soft, NT/ND, no masses Skin: without rashes Vascular Exam/Pulses: Palpable pulses throughout and all extremities are warm to touch. Palpable DP/PT in left foot.  Extremities: without ischemic changes, without Gangrene , with cellulitis;  with open wounds;  Musculoskeletal: no muscle wasting or atrophy  Neurologic: A&O X 3;  No focal weakness or paresthesias are detected; speech is fluent/normal Psychiatric:  The pt has Normal affect. Lymph:  Unremarkable  CBC    Component Value Date/Time   WBC 12.0 (H) 07/17/2024 1034   RBC 4.67 07/17/2024 1034   HGB 13.3 07/17/2024 1034   HCT 43.2 07/17/2024 1034   PLT 238 07/17/2024 1034   MCV 92.5 07/17/2024 1034   MCH 28.5 07/17/2024 1034   MCHC 30.8 07/17/2024 1034   RDW 14.8 07/17/2024 1034   LYMPHSABS 3.7 07/17/2024 1034   MONOABS 1.0 07/17/2024 1034   EOSABS 0.3 07/17/2024 1034   BASOSABS 0.1 07/17/2024 1034    BMET    Component Value Date/Time   NA 140 07/17/2024 1034   K 3.9 07/17/2024 1034   CL 105 07/17/2024 1034   CO2 24 07/17/2024 1034   GLUCOSE 101 (H) 07/17/2024 1034   BUN 14 07/17/2024 1034   CREATININE 0.82 07/17/2024 1034   CALCIUM 9.1 07/17/2024 1034   GFRNONAA >60 07/17/2024 1034   GFRAA >60 05/23/2020 0552    COAGS: Lab Results  Component Value Date   INR 1.2 05/22/2024   INR 1.1 05/21/2024   INR 1.1 07/18/2020     Non-Invasive Vascular Imaging:   EXAM:05/21/2024 RIGHT LOWER EXTREMITY VENOUS DOPPLER ULTRASOUND    TECHNIQUE: Gray-scale sonography with compression, as well as color and duplex ultrasound, were performed to evaluate the deep venous system(s) from the level of the common femoral vein through the popliteal and proximal calf veins.   COMPARISON:  01/06/2024   FINDINGS: VENOUS   Normal compressibility of the common femoral, superficial femoral, and popliteal veins, as well as the visualized calf veins. Visualized portions of profunda femoral vein and great saphenous vein unremarkable. No filling defects to suggest DVT on grayscale or color Doppler imaging. Doppler waveforms show normal direction of venous flow, normal respiratory plasticity and response to augmentation.   Limited views of the contralateral common femoral vein are unremarkable.   OTHER   None.   Limitations: none   IMPRESSION: Negative.  Statin:  No. Beta Blocker:  No. Aspirin:  No. ACEI:  No. ARB:  No. CCB use:  No Other antiplatelets/anticoagulants:  Yes.   Eliquis  5 mg BID   ASSESSMENT/PLAN: This is a 48 y.o. female history of rheumatoid arthritis, venous stasis ulcers of the lower limb, transmetatarsal amputation who presents with complaints of left toe pain redness and swelling and fevers She has a history of peripheral vascular disease, venous stasis has had several amputations already who presents with left fourth toe pain, fever, redness. Her exam is consistent with cellulitis and infection.  Vascular Surgery plans on taking the patient to the vascular lab today for a left lower extremity angiogram with possible intervention. I discussed in detail with the patient the procedure, benefits, risks and complications. Patient verbalizes her understanding and wishes to proceed. I answered all of her questions. Patient has been NPO since midnight. All patient Blood work is WNL.   I discussed the case with Dr Selinda Gu MD and he agrees with the plan.     [name] [facility] 07/17/2024 3:59 PM

## 2024-07-17 NOTE — Consult Note (Signed)
 Hospital Consult    Reason for Consult:  Left Foot/toe pain.  Requesting Physician:  Dr Eva Gay MD  MRN #:  [id number]  History of Present Illness: This is a 48 y.o. female with a history of rheumatoid arthritis, venous stasis ulcers of the lower limb, transmetatarsal amputation who presents with complaints of left toe pain redness and swelling and fevers She has a history of peripheral vascular disease, venous stasis has had several amputations already who presents with left fourth toe pain, fever, redness. Her exam is consistent with cellulitis and infection. Patient was seen by Dr Gay about a month ago with similar complaints. At that time noted diffuse darkened discoloration with pain to the area. She states over the last few days the pain has become worse of recent and was brought into the ER due to the worsening pain. She has had previous toe amputations to her 2nd and 5th toe. Vascular Surgery was consulted to evaluate.    Past Medical History:  Diagnosis Date   Arthralgia of right knee    Arthritis    Cellulitis of left leg    Collagen vascular disease    Lymphadenopathy, mesenteric    Right hand pain    Tobacco abuse     Past Surgical History:  Procedure Laterality Date   denies     LOWER EXTREMITY ANGIOGRAPHY Bilateral 05/19/2020   Procedure: Lower Extremity Angiography;  Surgeon: Marea Selinda RAMAN, MD;  Location: [facility];  Service: Cardiovascular;  Laterality: Bilateral;   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   Procedure: TRANSMETATARSAL AMPUTATION;  Surgeon: Neill Boas, DPM;  Location: [facility] OR[facility];  Service: Podiatry;  Laterality: Right;    Allergies  Allergen Reactions   Ibuprofen Other (See Comments)    Pt states that she sweats profusely after taking ibuprofen Other reaction(s): Other (See Comments) sweating:   Sulfa  Antibiotics Rash   Sulfasalazine Rash    Prior to Admission medications   Medication Sig Start Date End Date Taking?  Authorizing Provider  acetaminophen  (TYLENOL ) 500 MG tablet Take 1,000 mg by mouth daily. 05/09/21   [provider]  colchicine  0.6 MG tablet Take 0.6 mg by mouth daily. 03/02/24   [provider]  ELIQUIS  5 MG TABS tablet Take 5 mg by mouth 2 (two) times daily. 03/25/21   [provider]  hydroxychloroquine  (PLAQUENIL ) 200 MG tablet Take 200 mg by mouth 2 (two) times daily.    [provider]  methotrexate  (RHEUMATREX) 2.5 MG tablet Take 20 mg by mouth once a week.    [provider]  predniSONE  (DELTASONE ) 10 MG tablet Take 1 tablet (10 mg total) by mouth daily with breakfast. 05/24/24   Amin, Sumayya, MD  pregabalin (LYRICA) 50 MG capsule Take 50 mg by mouth 2 (two) times daily. 02/17/24 02/16/25  [provider]  saccharomyces boulardii (FLORASTOR) 250 MG capsule Take 1 capsule (250 mg total) by mouth 2 (two) times daily. Patient not taking: Reported on 05/21/2024 07/25/20   Tobie Yetta HERO, MD  venlafaxine  XR (EFFEXOR -XR) 75 MG 24 hr capsule Take 75 mg by mouth daily.    [provider]    Social History   Socioeconomic History   Marital status: Single    Spouse name: Not on file   Number of children: Not on file   Years of education: Not on file   Highest education level: Not on file  Occupational History   Not on file  Tobacco Use   Smoking status: Every  Day    Current packs/day: 0.50    Types: Cigarettes   Smokeless tobacco: Never  Vaping Use   Vaping status: Never Used  Substance and Sexual Activity   Alcohol use: No   Drug use: No   Sexual activity: Not Currently  Other Topics Concern   Not on file  Social History Narrative   Lives at home with father   Social Drivers of Health   Financial Resource Strain: High Risk (02/15/2024)   Received from [facility]   Overall Financial Resource Strain (CARDIA)    Difficulty of Paying Living Expenses: Very hard  Food Insecurity: No Food Insecurity  (05/21/2024)   Hunger Vital Sign    Worried About Running Out of Food in the Last Year: Never true    Ran Out of Food in the Last Year: Never true  Transportation Needs: No Transportation Needs (05/21/2024)   PRAPARE - Administrator, Civil Service (Medical): No    Lack of Transportation (Non-Medical): No  Physical Activity: Inactive (02/15/2024)   Received from [facility]   Exercise Vital Sign    On average, how many days per week do you engage in moderate to strenuous exercise (like a brisk walk)?: 0 days    On average, how many minutes do you engage in exercise at this level?: 0 min  Stress: Stress Concern Present (02/15/2024)   Received from [facility] of Occupational Health - Occupational Stress Questionnaire    Feeling of Stress : To some extent  Social Connections: Unknown (02/15/2024)   Received from [facility]   Social Connection and Isolation Panel    In a typical week, how many times do you talk on the phone with family, friends, or neighbors?: Once a week    How often do you get together with friends or relatives?: Patient unable to answer    How often do you attend church or religious services?: Never    Do you belong to any clubs or organizations such as church groups, unions, fraternal or athletic groups, or school groups?: No    How often do you attend meetings of the clubs or organizations you belong to?: Never    Are you married, widowed, divorced, separated, never married, or living with a partner?: Never married  Intimate Partner Violence: Not At Risk (05/21/2024)   Humiliation, Afraid, Rape, and Kick questionnaire    Fear of Current or Ex-Partner: No    Emotionally Abused: No    Physically Abused: No    Sexually Abused: No     Family History  Problem Relation Age of Onset   Leukemia Maternal Grandfather    Alcohol abuse Father     ROS: Otherwise negative unless mentioned in  HPI  Physical Examination  Vitals:   07/17/24 1033 07/17/24 1531  BP: 123/87 122/79  Pulse: (!) 106 84  Resp: 17 16  Temp: 99.6 F (37.6 C) 98.4 F (36.9 C)  SpO2: 100% 99%   Body mass index is 40.94 kg/m.  General:  WDWN in NAD Gait: Not observed HENT: WNL, normocephalic Pulmonary: normal non-labored breathing, without Rales, rhonchi,  wheezing Cardiac: regular, without  Murmurs, rubs or gallops; without carotid bruits Abdomen: Positive bowel sounds throughout,  soft, NT/ND, no masses Skin: without rashes Vascular Exam/Pulses: Palpable pulses throughout and all extremities are warm to touch. Palpable DP/PT in left foot.  Extremities: without ischemic changes, without Gangrene , with cellulitis;  with open wounds;  Musculoskeletal: no muscle wasting or atrophy  Neurologic: A&O X 3;  No focal weakness or paresthesias are detected; speech is fluent/normal Psychiatric:  The pt has Normal affect. Lymph:  Unremarkable  CBC    Component Value Date/Time   WBC 12.0 (H) 07/17/2024 1034   RBC 4.67 07/17/2024 1034   HGB 13.3 07/17/2024 1034   HCT 43.2 07/17/2024 1034   PLT 238 07/17/2024 1034   MCV 92.5 07/17/2024 1034   MCH 28.5 07/17/2024 1034   MCHC 30.8 07/17/2024 1034   RDW 14.8 07/17/2024 1034   LYMPHSABS 3.7 07/17/2024 1034   MONOABS 1.0 07/17/2024 1034   EOSABS 0.3 07/17/2024 1034   BASOSABS 0.1 07/17/2024 1034    BMET    Component Value Date/Time   NA 140 07/17/2024 1034   K 3.9 07/17/2024 1034   CL 105 07/17/2024 1034   CO2 24 07/17/2024 1034   GLUCOSE 101 (H) 07/17/2024 1034   BUN 14 07/17/2024 1034   CREATININE 0.82 07/17/2024 1034   CALCIUM 9.1 07/17/2024 1034   GFRNONAA >60 07/17/2024 1034   GFRAA >60 05/23/2020 0552    COAGS: Lab Results  Component Value Date   INR 1.2 05/22/2024   INR 1.1 05/21/2024   INR 1.1 07/18/2020     Non-Invasive Vascular Imaging:   EXAM:05/21/2024 RIGHT LOWER EXTREMITY VENOUS DOPPLER ULTRASOUND    TECHNIQUE: Gray-scale sonography with compression, as well as color and duplex ultrasound, were performed to evaluate the deep venous system(s) from the level of the common femoral vein through the popliteal and proximal calf veins.   COMPARISON:  01/06/2024   FINDINGS: VENOUS   Normal compressibility of the common femoral, superficial femoral, and popliteal veins, as well as the visualized calf veins. Visualized portions of profunda femoral vein and great saphenous vein unremarkable. No filling defects to suggest DVT on grayscale or color Doppler imaging. Doppler waveforms show normal direction of venous flow, normal respiratory plasticity and response to augmentation.   Limited views of the contralateral common femoral vein are unremarkable.   OTHER   None.   Limitations: none   IMPRESSION: Negative.  Statin:  No. Beta Blocker:  No. Aspirin:  No. ACEI:  No. ARB:  No. CCB use:  No Other antiplatelets/anticoagulants:  Yes.   Eliquis  5 mg BID   ASSESSMENT/PLAN: This is a 48 y.o. female history of rheumatoid arthritis, venous stasis ulcers of the lower limb, transmetatarsal amputation who presents with complaints of left toe pain redness and swelling and fevers She has a history of peripheral vascular disease, venous stasis has had several amputations already who presents with left fourth toe pain, fever, redness. Her exam is consistent with cellulitis and infection.  Vascular Surgery plans on taking the patient to the vascular lab today for a left lower extremity angiogram with possible intervention. I discussed in detail with the patient the procedure, benefits, risks and complications. Patient verbalizes her understanding and wishes to proceed. I answered all of her questions. Patient has been NPO since midnight. All patient Blood work is WNL.   I discussed the case with Dr Selinda Gu MD and he agrees with the plan.     [name] [facility] 07/17/2024 3:59 PM

## 2024-07-17 NOTE — Consult Note (Signed)
 ORTHOPAEDIC CONSULTATION  REQUESTING PHYSICIAN: Arlander Charleston, MD  Chief Complaint: Painful left fourth toe  HPI: Regina Carpenter is a 48 y.o. female who complains of worsening pain to her left fourth toe.  She was concerned with this for some time.  Actually seen in outpatient clinic by myself about a month ago with similar complaints.  At that time noted diffuse darkened discoloration with pain to the area.  She states over the last few days the pain has become worse of recent and was brought into the ER due to the worsening pain.  Denies any trauma to the area.  She is undergoing previous 2nd and 5th toe amputation for ulcerations in the past.  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    Past Medical History:  Diagnosis Date   Arthralgia of right knee    Arthritis    Cellulitis of left leg    Collagen vascular disease    Lymphadenopathy, mesenteric    Right hand pain    Tobacco abuse    Past Surgical History:  Procedure Laterality Date   denies     LOWER EXTREMITY ANGIOGRAPHY Bilateral 05/19/2020   Procedure: Lower Extremity Angiography;  Surgeon: Marea Selinda RAMAN, MD;  Location: [facility] INV[facility] CV [facility];  Service: Cardiovascular;  Laterality: Bilateral;   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   Procedure: TRANSMETATARSAL AMPUTATION;  Surgeon: Neill Boas, DPM;  Location: [facility] ORS;  Service: Podiatry;  Laterality: Right;   Social History   Socioeconomic History   Marital status: Single    Spouse name: Not on file   Number of children: Not on file   Years of education: Not on file   Highest education level: Not on file  Occupational History   Not on file  Tobacco Use   Smoking status: Every Day    Current packs/day: 0.50    Types: Cigarettes   Smokeless tobacco: Never  Vaping Use   Vaping status: Never Used  Substance and Sexual Activity   Alcohol use: No   Drug use: No   Sexual activity: Not Currently  Other Topics Concern    Not on file  Social History Narrative   Lives at home with father   Social Drivers of Health   Financial Resource Strain: High Risk (02/15/2024)   Received from [facility]   Overall Financial Resource Strain (CARDIA)    Difficulty of Paying Living Expenses: Very hard  Food Insecurity: No Food Insecurity (05/21/2024)   Hunger Vital Sign    Worried About Running Out of Food in the Last Year: Never true    Ran Out of Food in the Last Year: Never true  Transportation Needs: No Transportation Needs (05/21/2024)   PRAPARE - Administrator, Civil Service (Medical): No    Lack of Transportation (Non-Medical): No  Physical Activity: Inactive (02/15/2024)   Received from [facility]   Exercise Vital Sign    On average, how many days per week do you engage in moderate to strenuous exercise (like a brisk walk)?: 0 days    On average, how many minutes do you engage in exercise at this level?: 0 min  Stress: Stress Concern Present (02/15/2024)   Received from [facility] of Occupational Health - Occupational Stress Questionnaire    Feeling of Stress : To some extent  Social Connections: Unknown (02/15/2024)   Received from [facility]   Social Connection and  Isolation Panel    In a typical week, how many times do you talk on the phone with family, friends, or neighbors?: Once a week    How often do you get together with friends or relatives?: Patient unable to answer    How often do you attend church or religious services?: Never    Do you belong to any clubs or organizations such as church groups, unions, fraternal or athletic groups, or school groups?: No    How often do you attend meetings of the clubs or organizations you belong to?: Never    Are you married, widowed, divorced, separated, never married, or living with a partner?: Never married   Family History  Problem Relation Age of Onset    Leukemia Maternal Grandfather    Alcohol abuse Father    Allergies  Allergen Reactions   Ibuprofen Other (See Comments)    Pt states that she sweats profusely after taking ibuprofen Other reaction(s): Other (See Comments) sweating:   Sulfa  Antibiotics Rash   Sulfasalazine Rash   Prior to Admission medications   Medication Sig Start Date End Date Taking? Authorizing Provider  acetaminophen  (TYLENOL ) 500 MG tablet Take 1,000 mg by mouth daily. 05/09/21   [provider]  colchicine  0.6 MG tablet Take 0.6 mg by mouth daily. 03/02/24   [provider]  ELIQUIS  5 MG TABS tablet Take 5 mg by mouth 2 (two) times daily. 03/25/21   [provider]  hydroxychloroquine  (PLAQUENIL ) 200 MG tablet Take 200 mg by mouth 2 (two) times daily.    [provider]  methotrexate  (RHEUMATREX) 2.5 MG tablet Take 20 mg by mouth once a week.    [provider]  predniSONE  (DELTASONE ) 10 MG tablet Take 1 tablet (10 mg total) by mouth daily with breakfast. 05/24/24   Amin, Sumayya, MD  pregabalin (LYRICA) 50 MG capsule Take 50 mg by mouth 2 (two) times daily. 02/17/24 02/16/25  [provider]  saccharomyces boulardii (FLORASTOR) 250 MG capsule Take 1 capsule (250 mg total) by mouth 2 (two) times daily. Patient not taking: Reported on 05/21/2024 07/25/20   Tobie Yetta HERO, MD  venlafaxine  XR (EFFEXOR -XR) 75 MG 24 hr capsule Take 75 mg by mouth daily.    [provider]   DG Foot Complete Left Result Date: 07/17/2024 CLINICAL DATA:  Toe infection EXAM: LEFT FOOT - COMPLETE 3 VIEW COMPARISON:  None Available. FINDINGS: Surgical changes of prior partial amputation of the second and fifth digits. No conventional radiographic evidence of active osteomyelitis. No acute fracture or malalignment. Soft tissue swelling is present over the dorsal forefoot concerning for cellulitis. IMPRESSION: Soft tissue swelling over the distal forefoot concerning for possible  cellulitis. Surgical changes of prior partial amputation of the second and fifth digits. No evidence of fracture, malalignment or active osteomyelitis. Electronically Signed   By: Wilkie Lent M.D.   On: 07/17/2024 12:24    Positive ROS: All other systems have been reviewed and were otherwise negative with the exception of those mentioned in the HPI and as above.  12 point ROS was performed.  Physical Exam: General: Alert and oriented.  No apparent distress.  Vascular:  Left foot:Dorsalis Pedis:  present Posterior Tibial:  present  Neuro: Sensation is grossly intact  Derm: The left fourth toe is slightly darkened and color.  There is no active draining areas.  There was a little bit of superficial fluctuance of the skin.  I performed a superficial puncture of the skin to  assess for purulence and no drainage was noted.  No lymphangitic streaking.  Ortho/MS: She status post left 2nd and 5th toe amputation.  Status post transmetatarsal amputation of the right foot in 2021  I personally reviewed x-rays and these were negative for fracture or gas in the left fourth toe.  Assessment: Painful left fourth toe with history of inflammatory arthritis and vasculitis History of previous left 2nd and 5th toe amputations for ulcerations History of transmetatarsal amputation contralateral foot   Plan: I suspect this is due to her vasculitis causing worsening symptoms of the microcirculation to the area.  She has strongly palpable pulses to this foot at the [facility] and PT region.  Her foot is nice and warm.  There is no open wounds or drainable abscess.  Could consider vascular consult for their opinion in regards to microcirculation to this lower extremity.  She is currently on Eliquis .  Close follow-up with rheumatology would be warranted as well.    Ashley Eva LABOR, DPM Cell [phone]   07/17/2024 12:55 PM

## 2024-07-17 NOTE — ED Notes (Signed)
 Patient to bedside commode and back to bed without incident.

## 2024-07-17 NOTE — ED Provider Notes (Signed)
 [facility] Provider Note    Event Date/Time   First MD Initiated Contact with Patient 07/17/24 1055     (approximate)   History   Wound Check  HPI  Regina Carpenter is a 48 y.o. female with a history of rheumatoid arthritis, venous stasis ulcers of the lower limb, transmetatarsal amputation who presents with complaints of left toe pain redness and swelling and fevers     Physical Exam   Triage Vital Signs: ED Triage Vitals  Encounter Vitals Group     BP 07/17/24 1033 123/87     Girls Systolic BP Percentile --      Girls Diastolic BP Percentile --      Boys Systolic BP Percentile --      Boys Diastolic BP Percentile --      Pulse Rate 07/17/24 1033 (!) 106     Resp 07/17/24 1033 17     Temp 07/17/24 1033 99.6 F (37.6 C)     Temp Source 07/17/24 1033 Oral     SpO2 07/17/24 1033 100 %     Weight 07/17/24 1031 111.6 kg (246 lb)     Height 07/17/24 1031 1.651 m (5' 5)     Head Circumference --      Peak Flow --      Pain Score 07/17/24 1031 10     Pain Loc --      Pain Education --      Exclude from Growth Chart --     Most recent vital signs: Vitals:   07/17/24 1033  BP: 123/87  Pulse: (!) 106  Resp: 17  Temp: 99.6 F (37.6 C)  SpO2: 100%     General: Awake,  CV:  Good peripheral perfusion.  Resp:  Normal effort.  Abd:  No distention.  Other:  Left fourth toe swollen erythematous, tender to touch with mild surrounding erythema   ED Results / Procedures / Treatments   Labs (all labs ordered are listed, but only abnormal results are displayed) Labs Reviewed  COMPREHENSIVE METABOLIC PANEL WITH GFR - Abnormal; Notable for the following components:      Result Value   Glucose, Bld 101 (*)    All other components within normal limits  CBC WITH DIFFERENTIAL/PLATELET - Abnormal; Notable for the following components:   WBC 12.0 (*)    All other components within normal limits  CULTURE, BLOOD (ROUTINE X 2)  CULTURE, BLOOD  (ROUTINE X 2)  LACTIC ACID, PLASMA  LACTIC ACID, PLASMA     EKG     RADIOLOGY X-ray left foot pending    PROCEDURES:  Critical Care performed:   Procedures   MEDICATIONS ORDERED IN ED: Medications  Ampicillin-Sulbactam (UNASYN) 3 g in sodium chloride  0.9 % 100 mL IVPB (0 g Intravenous Stopped 07/17/24 1212)    And  vancomycin  (VANCOCIN ) IVPB 1000 mg/200 mL premix (1,000 mg Intravenous New Bag/Given 07/17/24 1216)  fentaNYL  (SUBLIMAZE ) injection 50 mcg (50 mcg Intravenous Given 07/17/24 1132)     IMPRESSION / MDM / ASSESSMENT AND PLAN / ED COURSE  I reviewed the triage vital signs and the nursing notes. Patient's presentation is most consistent with severe exacerbation of chronic illness.  Patient with history of peripheral vascular disease, venous stasis has had several amputations already who presents with left fourth toe pain, fever, redness  Her temperature is elevated here, she is tachycardic and has an elevated white blood cell count.  Exam is consistent with likely cellulitis/infection  I will  treat with broad-spectrum IV antibiotics, consult podiatry and admit the patient to the hospitalist team  Consulted  Dr. Lennie of podiatry      FINAL CLINICAL IMPRESSION(S) / ED DIAGNOSES   Final diagnoses:  Cellulitis of left foot     Rx / DC Orders   ED Discharge Orders     None        Note:  This document was prepared using Dragon voice recognition software and may include unintentional dictation errors.   Arlander Charleston, MD 07/17/24 1225

## 2024-07-17 NOTE — H&P (Signed)
 History and Physical    Regina Carpenter FMW:[id number] DOB: Feb 07, 1976 DOA: 07/17/2024  DOS: the patient was seen and examined on 07/17/2024  PCP: [facility]   Patient coming from: Home  I have personally briefly reviewed patient's old medical records in [facility] and CareEverywhere  HPI:   Regina Carpenter is a 48 y.o. year old female with past medical history of RA, chronic pain syndrome, PVD, hx of MRSA infections, previous PE on anticoagulation and right transmetatarsal amputation came to the ED with fevers, chill and left foot pain.   Pt states she developed pain in that worsened over the last 2-3 days. She has chronic pain in her legs. States she's been told she has bad blood flow. Felt feverish but didn't check temp.   ED Course: On arrival to the ED patient was noted to be HDS stable. Lab work concerning for infectious etiology, pt started on broad spectrum abx and podiatry consulted by EDP. Foot x ray is obtained and is pending.   TRH contacted for admission.  Review of Systems: As mentioned in the history of present illness. All other systems reviewed and are negative.   Past Medical History:  Diagnosis Date   Arthralgia of right knee    Arthritis    Cellulitis of left leg    Collagen vascular disease    Lymphadenopathy, mesenteric    Right hand pain    Tobacco abuse     Past Surgical History:  Procedure Laterality Date   denies     LOWER EXTREMITY ANGIOGRAPHY Bilateral 05/19/2020   Procedure: Lower Extremity Angiography;  Surgeon: Marea Selinda RAMAN, MD;  Location: [facility] INVASIVE CV LAB;  Service: Cardiovascular;  Laterality: Bilateral;   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   Procedure: TRANSMETATARSAL AMPUTATION;  Surgeon: Neill Boas, DPM;  Location: [facility] ORS;  Service: Podiatry;  Laterality: Right;     Allergies  Allergen Reactions   Ibuprofen Other (See Comments)    Pt states that she sweats profusely after taking ibuprofen Other  reaction(s): Other (See Comments) sweating:   Sulfa  Antibiotics Rash   Sulfasalazine Rash    Family History  Problem Relation Age of Onset   Leukemia Maternal Grandfather    Alcohol abuse Father     Prior to Admission medications   Medication Sig Start Date End Date Taking? Authorizing Provider  acetaminophen  (TYLENOL ) 500 MG tablet Take 1,000 mg by mouth daily. 05/09/21   [provider]  colchicine  0.6 MG tablet Take 0.6 mg by mouth daily. 03/02/24   [provider]  ELIQUIS  5 MG TABS tablet Take 5 mg by mouth 2 (two) times daily. 03/25/21   [provider]  hydroxychloroquine  (PLAQUENIL ) 200 MG tablet Take 200 mg by mouth 2 (two) times daily.    [provider]  methotrexate  (RHEUMATREX) 2.5 MG tablet Take 20 mg by mouth once a week.    [provider]  predniSONE  (DELTASONE ) 10 MG tablet Take 1 tablet (10 mg total) by mouth daily with breakfast. 05/24/24   Amin, Sumayya, MD  pregabalin (LYRICA) 50 MG capsule Take 50 mg by mouth 2 (two) times daily. 02/17/24 02/16/25  [provider]  saccharomyces boulardii (FLORASTOR) 250 MG capsule Take 1 capsule (250 mg total) by mouth 2 (two) times daily. Patient not taking: Reported on 05/21/2024 07/25/20   Tobie Yetta HERO, MD  venlafaxine  XR (EFFEXOR -XR) 75 MG 24 hr capsule Take 75 mg by mouth daily.    [provider]  reports that she has been smoking cigarettes. She has never used smokeless tobacco. She reports that she does not drink alcohol and does not use drugs. Lives with room mate Tobacco- 1/2 ppd for 20 years  EtOH- Denies use.  Illicit drug use- denies use.  IADLs/ADLs- can person independently at baseline    Physical Exam: Vitals:   07/17/24 1031 07/17/24 1033 07/17/24 1531 07/17/24 1728  BP:  123/87 122/79 134/86  Pulse:  (!) 106 84 100  Resp:  17 16 18   Temp:  99.6 F (37.6 C) 98.4 F (36.9 C) 98.4 F (36.9 C)  TempSrc:  Oral Oral   SpO2:  100% 99% 96%   Weight: 111.6 kg     Height: 5' 5 (1.651 m)      Gen: NAD HENT: NCAT CV: sinus tachycardia Resp: CTAB Abd: No TTP MSK: pt with left toe wrapped. 2 and 5th digit with amputation.  Skin: slight erythema in left foot Neuro: alert and oriented x4 Psych: pleasant mood   Labs on Admission: I have personally reviewed following labs and imaging studies  CBC: Recent Labs  Lab 07/17/24 1034  WBC 12.0*  NEUTROABS 7.0  HGB 13.3  HCT 43.2  MCV 92.5  PLT 238   Basic Metabolic Panel: Recent Labs  Lab 07/17/24 1034  NA 140  K 3.9  CL 105  CO2 24  GLUCOSE 101*  BUN 14  CREATININE 0.82  CALCIUM 9.1   GFR: Estimated Creatinine Clearance: 103.2 mL/min (by C-G formula based on SCr of 0.82 mg/dL). Liver Function Tests: Recent Labs  Lab 07/17/24 1034  AST 19  ALT 20  ALKPHOS 66  BILITOT 0.9  PROT 7.4  ALBUMIN 3.8   No results for input(s): LIPASE, AMYLASE in the last 168 hours. No results for input(s): AMMONIA in the last 168 hours. Coagulation Profile: No results for input(s): INR, PROTIME in the last 168 hours. Cardiac Enzymes: No results for input(s): CKTOTAL, CKMB, CKMBINDEX, TROPONINI, TROPONINIHS in the last 168 hours. BNP (last 3 results) Recent Labs    01/06/24 1353  BNP 15.5   HbA1C: No results for input(s): HGBA1C in the last 72 hours. CBG: No results for input(s): GLUCAP in the last 168 hours. Lipid Profile: No results for input(s): CHOL, HDL, LDLCALC, TRIG, CHOLHDL, LDLDIRECT in the last 72 hours. Thyroid Function Tests: No results for input(s): TSH, T4TOTAL, FREET4, T3FREE, THYROIDAB in the last 72 hours. Anemia Panel: No results for input(s): VITAMINB12, FOLATE, FERRITIN, TIBC, IRON, RETICCTPCT in the last 72 hours. Urine analysis:    Component Value Date/Time   COLORURINE STRAW (A) 05/22/2024 0515   APPEARANCEUR CLEAR (A) 05/22/2024 0515   LABSPEC 1.006 05/22/2024 0515   PHURINE 5.0  05/22/2024 0515   GLUCOSEU NEGATIVE 05/22/2024 0515   HGBUR NEGATIVE 05/22/2024 0515   BILIRUBINUR NEGATIVE 05/22/2024 0515   KETONESUR NEGATIVE 05/22/2024 0515   PROTEINUR NEGATIVE 05/22/2024 0515   NITRITE NEGATIVE 05/22/2024 0515   LEUKOCYTESUR NEGATIVE 05/22/2024 0515    Radiological Exams on Admission: I have personally reviewed images DG Foot Complete Left Result Date: 07/17/2024 CLINICAL DATA:  Toe infection EXAM: LEFT FOOT - COMPLETE 3 VIEW COMPARISON:  None Available. FINDINGS: Surgical changes of prior partial amputation of the second and fifth digits. No conventional radiographic evidence of active osteomyelitis. No acute fracture or malalignment. Soft tissue swelling is present over the dorsal forefoot concerning for cellulitis. IMPRESSION: Soft tissue swelling over the distal forefoot concerning for possible cellulitis. Surgical changes of prior partial amputation  of the second and fifth digits. No evidence of fracture, malalignment or active osteomyelitis. Electronically Signed   By: Wilkie Lent M.D.   On: 07/17/2024 12:24     Assessment/Plan Principal Problem:   Cellulitis of left foot Active Problems:   Phantom limb syndrome with pain (HCC) (Right)   Rheumatoid arthritis (HCC)   Essential hypertension   Cellulitis Patient with foot infection given erythema and imaging finding of foot pain. Pt follows with podiatry and they were consulted by EDP. Appreciate recommendations.  Patient started on Vanc/Unasyn by EDP, given non-pururlent nature will change to IV cefazolin .   Will trend CBC, monitor fever curve.  Blood cultures ordered and pending. NPO after midnight order placed in case the needs surgical intervention tomorrow.   Chronic problem: Rheumatoid arthritis: Continue home regimen. History of PE: Continue home Eliquis  Phantom limb pain: Continue home Lyrica and Effexor  Essential hypertension: Continue to monitor.  Not on any pharmacotherapy.  VTE  prophylaxis:  Eliquis   Diet: HH Code Status:  Full Code Telemetry:  Admission status: Inpatient, Med-Surg Patient is from: Home  Anticipated d/c is to: Home  Anticipated d/c is in: 3-4 days   Family Communication: Updated at bedside   Consults called: Podiatry    Severity of Illness: The appropriate patient status for this patient is INPATIENT. Inpatient status is judged to be reasonable and necessary in order to provide the required intensity of service to ensure the patient's safety. The patient's presenting symptoms, physical exam findings, and initial radiographic and laboratory data in the context of their chronic comorbidities is felt to place them at high risk for further clinical deterioration. Furthermore, it is not anticipated that the patient will be medically stable for discharge from the hospital within 2 midnights of admission.   * I certify that at the point of admission it is my clinical judgment that the patient will require inpatient hospital care spanning beyond 2 midnights from the point of admission due to high intensity of service, high risk for further deterioration and high frequency of surveillance required.DEWAINE Morene Bathe, MD [facility] DEL. [facility]

## 2024-07-17 NOTE — ED Triage Notes (Signed)
 Patient to ED via POV for wounds on bilateral legs. States ongoing for months but pain has been worsening.

## 2024-07-18 ENCOUNTER — Inpatient Hospital Stay

## 2024-07-18 ENCOUNTER — Encounter: Payer: Self-pay | Admitting: Vascular Surgery

## 2024-07-18 ENCOUNTER — Encounter: Admission: EM | Disposition: A | Payer: Self-pay | Source: Home / Self Care | Attending: Internal Medicine

## 2024-07-18 DIAGNOSIS — Z8679 Personal history of other diseases of the circulatory system: Secondary | ICD-10-CM | POA: Diagnosis not present

## 2024-07-18 DIAGNOSIS — L97529 Non-pressure chronic ulcer of other part of left foot with unspecified severity: Secondary | ICD-10-CM | POA: Diagnosis not present

## 2024-07-18 DIAGNOSIS — L03116 Cellulitis of left lower limb: Secondary | ICD-10-CM | POA: Diagnosis not present

## 2024-07-18 HISTORY — PX: LOWER EXTREMITY ANGIOGRAPHY: CATH118251

## 2024-07-18 SURGERY — LOWER EXTREMITY ANGIOGRAPHY
Anesthesia: Moderate Sedation | Laterality: Left

## 2024-07-18 MED ORDER — CEFAZOLIN SODIUM-DEXTROSE 1-4 GM/50ML-% IV SOLN
1.0000 g | Freq: Three times a day (TID) | INTRAVENOUS | Status: DC
  Administered 2024-07-18 – 2024-07-19 (×3): 1 g via INTRAVENOUS
  Filled 2024-07-18 (×5): qty 50

## 2024-07-18 MED ORDER — IODIXANOL 320 MG/ML IV SOLN
INTRAVENOUS | Status: DC | PRN
  Administered 2024-07-18: 35 mL

## 2024-07-18 MED ORDER — CEFAZOLIN SODIUM-DEXTROSE 2-4 GM/100ML-% IV SOLN: 2.0000 g | INTRAVENOUS | Status: DC

## 2024-07-18 MED ORDER — FENTANYL CITRATE (PF) 100 MCG/2ML IJ SOLN
INTRAMUSCULAR | Status: AC
  Filled 2024-07-18: qty 2

## 2024-07-18 MED ORDER — SODIUM CHLORIDE 0.9 % IV SOLN: INTRAVENOUS | Status: DC

## 2024-07-18 MED ORDER — MIDAZOLAM HCL 5 MG/5ML IJ SOLN
INTRAMUSCULAR | Status: AC
  Filled 2024-07-18: qty 5

## 2024-07-18 MED ORDER — MIDAZOLAM HCL 2 MG/ML PO SYRP: 8.0000 mg | ORAL_SOLUTION | Freq: Once | ORAL | Status: DC | PRN

## 2024-07-18 MED ORDER — FAMOTIDINE 20 MG PO TABS: 40.0000 mg | ORAL_TABLET | Freq: Once | ORAL | Status: DC | PRN

## 2024-07-18 MED ORDER — LIDOCAINE-EPINEPHRINE (PF) 1 %-1:200000 IJ SOLN
INTRAMUSCULAR | Status: DC | PRN
  Administered 2024-07-18: 10 mL

## 2024-07-18 MED ORDER — HEPARIN (PORCINE) IN NACL 2000-0.9 UNIT/L-% IV SOLN
INTRAVENOUS | Status: DC | PRN
  Administered 2024-07-18: 1000 mL

## 2024-07-18 MED ORDER — DIPHENHYDRAMINE HCL 50 MG/ML IJ SOLN: 50.0000 mg | Freq: Once | INTRAMUSCULAR | Status: DC | PRN

## 2024-07-18 MED ORDER — FENTANYL CITRATE (PF) 100 MCG/2ML IJ SOLN
INTRAMUSCULAR | Status: DC | PRN
  Administered 2024-07-18: 25 ug via INTRAVENOUS
  Administered 2024-07-18: 50 ug via INTRAVENOUS

## 2024-07-18 MED ORDER — MIDAZOLAM HCL 2 MG/2ML IJ SOLN
INTRAMUSCULAR | Status: DC | PRN
  Administered 2024-07-18: 2 mg via INTRAVENOUS
  Administered 2024-07-18: 1 mg via INTRAVENOUS

## 2024-07-18 MED ORDER — METHYLPREDNISOLONE SODIUM SUCC 125 MG IJ SOLR: 125.0000 mg | Freq: Once | INTRAMUSCULAR | Status: DC | PRN

## 2024-07-18 MED ORDER — HEPARIN SODIUM (PORCINE) 1000 UNIT/ML IJ SOLN
INTRAMUSCULAR | Status: AC
  Filled 2024-07-18: qty 10

## 2024-07-18 SURGICAL SUPPLY — 9 items
CATH ANGIO 5F PIGTAIL 65CM (CATHETERS) IMPLANT
COVER PROBE ULTRASOUND 5X96 (MISCELLANEOUS) IMPLANT
DEVICE STARCLOSE SE CLOSURE (Vascular Products) IMPLANT
GLIDEWIRE ADV .035X260CM (WIRE) IMPLANT
PACK ANGIOGRAPHY (CUSTOM PROCEDURE TRAY) ×1 IMPLANT
SHEATH BRITE TIP 5FRX11 (SHEATH) IMPLANT
SYR MEDRAD MARK 7 150ML (SYRINGE) IMPLANT
TUBING CONTRAST HIGH PRESS 72 (TUBING) IMPLANT
WIRE J 3MM .035X145CM (WIRE) IMPLANT

## 2024-07-18 NOTE — Op Note (Signed)
 [facility]  Percutaneous Study/Intervention Procedural Note   Date of Surgery: 07/18/2024  Surgeon(s):Ivelisse Culverhouse    Assistants:none  Pre-operative Diagnosis: Ulceration left foot  Post-operative diagnosis:  Same  Procedure(s) Performed:             1.  Ultrasound guidance for vascular access right femoral artery             2.  Catheter placement into left SFA from right femoral approach             3.  Aortogram and selective left lower extremity angiogram             4.  StarClose closure device right femoral artery  EBL: 3 cc  Contrast: 35 cc  Fluoro Time: 1.5 minutes  Moderate Conscious Sedation Time: approximately 26 minutes using 3 mg of Versed  and 75 mcg of Fentanyl               Indications:  Patient is a 48 y.o.female with non-healing ulcerations of the foot on the left. She has a history of vasculitis. The patient is brought in for angiography for further evaluation and potential treatment.  Due to the limb threatening nature of the situation, angiogram was performed for attempted limb salvage. The patient is aware that if the procedure fails, amputation would be expected.  The patient also understands that even with successful revascularization, amputation may still be required due to the severity of the situation.  Risks and benefits are discussed and informed consent is obtained.   Procedure:  The patient was identified and appropriate procedural time out was performed.  The patient was then placed supine on the table and prepped and draped in the usual sterile fashion. Moderate conscious sedation was administered during a face to face encounter with the patient throughout the procedure with my supervision of the RN administering medicines and monitoring the patient's vital signs, pulse oximetry, telemetry and mental status throughout from the start of the procedure until the patient was taken to the recovery room. Ultrasound was used to evaluate the  right common femoral artery.  It was patent .  A digital ultrasound image was acquired.  A Seldinger needle was used to access the right common femoral artery under direct ultrasound guidance and a permanent image was performed.  A 0.035 J wire was advanced without resistance and a 5Fr sheath was placed.  Pigtail catheter was placed into the aorta and an AP aortogram was performed. This demonstrated normal renal arteries and normal aorta and iliac segments without significant stenosis. I then crossed the aortic bifurcation and advanced to the left femoral head and then into the left SFA to help opacify distally. Selective left lower extremity angiogram was then performed. This demonstrated normal left common femoral artery, profunda femoris artery, superficial femoral and popliteal arteries.  The anterior tibial artery had a very high takeoff just above the knee and medial but was large and continuous into the foot.  The posterior tibial artery was also large and continuous into the foot.  The peroneal artery was small.  There were no obvious focal stenosis in the tibial vessels. I elected to terminate the procedure. The sheath was removed and StarClose closure device was deployed in the right femoral artery with excellent hemostatic result. The patient was taken to the recovery room in stable condition having tolerated the procedure well.  Findings:               Aortogram:  This  demonstrated normal renal arteries and normal aorta and iliac segments without significant stenosis.             Left Lower Extremity:  This demonstrated normal left common femoral artery, profunda femoris artery, superficial femoral and popliteal arteries.  The anterior tibial artery had a very high takeoff just above the knee and medial but was large and continuous into the foot.  The posterior tibial artery was also large and continuous into the foot.  The peroneal artery was small.  There were no obvious focal stenosis in the  tibial vessels.   Disposition: Patient was taken to the recovery room in stable condition having tolerated the procedure well.  Complications: None  Selinda Gu 07/18/2024 1:00 PM   This note was created with Dragon Medical transcription system. Any errors in dictation are purely unintentional.

## 2024-07-18 NOTE — Interval H&P Note (Signed)
 History and Physical Interval Note:  07/18/2024 12:09 PM  Regina Carpenter  has presented today for surgery, with the diagnosis of left foot ischemia.  The various methods of treatment have been discussed with the patient and family. After consideration of risks, benefits and other options for treatment, the patient has consented to  Procedure(s): Lower Extremity Angiography (Left) as a surgical intervention.  The patient's history has been reviewed, patient examined, no change in status, stable for surgery.  I have reviewed the patient's chart and labs.  Questions were answered to the patient's satisfaction.     [name]

## 2024-07-18 NOTE — Progress Notes (Signed)
  Progress Note   Patient: Regina Carpenter FMW:[id number] DOB: Jul 14, 1976 DOA: 07/17/2024     1 DOS: the patient was seen and examined on 07/18/2024   Brief hospital course: 48 y.o. year old female with past medical history of RA, chronic pain syndrome, PVD, hx of MRSA infections, previous PE on anticoagulation and right transmetatarsal amputation came to the ED with fevers, chill and left foot pain.    Pt states she developed pain in that worsened over the last 2-3 days. She has chronic pain in her legs  On arrival to the ED patient was noted to be HDS stable. Lab work concerning for infectious etiology, pt started on broad spectrum abx and podiatry consulted by EDP   Assessment and Plan: Cellulitis Patient with foot infection given erythema and imaging finding of foot pain.  -Pt follows with podiatry and they were consulted --Patient started on Vanc/Unasyn by EDP, given non-pururlent nature will change to IV cefazolin .    -Vascular Surgeon consulted per Podiatry recs -Pt for arteriogram today    Chronic problem: Rheumatoid arthritis:  -on methotrexate  qweekly -continued plaquenil  and prednisone   History of PE:  -Continue home Eliquis   Phantom limb pain:  -Continue home Lyrica and Effexor   Hx Essential hypertension:  -not on bp meds PTA -bp very well controlled      Subjective: Without complaints this AM  Physical Exam: Vitals:   07/18/24 1315 07/18/24 1345 07/18/24 1400 07/18/24 1415  BP: (!) (P) 156/96 (!) 146/86 128/82 108/77  Pulse: (P) 99 100 89 92  Resp: (P) 18 19 16 16   Temp:    98.2 F (36.8 C)  TempSrc:      SpO2: (P) 93% 93% 96% 100%  Weight:      Height:       General exam: Awake, laying in bed, in nad Respiratory system: Normal respiratory effort, no wheezing Cardiovascular system: regular rate, s1, s2 Gastrointestinal system: Soft, nondistended, positive BS Central nervous system: CN2-12 grossly intact, strength intact Extremities: Perfused, no  clubbing Skin: Normal skin turgor, no notable skin lesions seen Psychiatry: Mood normal // no visual hallucinations   Data Reviewed:  There are no new results to review at this time.  Family Communication: Pt in room, family not at bedside  Disposition: Status is: Inpatient Remains inpatient appropriate because: severity of illness  Planned Discharge Destination: Home    Author: Garnette Pelt, MD 07/18/2024 2:46 PM  For on call review [email or web address].

## 2024-07-18 NOTE — Consult Note (Addendum)
 WOC Nurse Consult Note: patient with history of PVD  Reason for Consult: R leg and L heel  Wound type: 1. Full thickness R lower leg ? R/t venous insufficiency vs vasculitis  2.  R heel callus/healing blister  Pressure Injury POA: NA  Measurement: see nursing flowsheet  Wound bed: R lower leg 100% dry brown tissue; R heel dark callus  Drainage (amount, consistency, odor) see nursing flowsheet  Periwound: legs with evidence of old healing wounds  Dressing procedure/placement/frequency: Paint R leg wound with Betadine  then apply Xeroform gauze Soila [phone]) to wound bed daily and secure with silicone foam.  Paint R heel callus/blister with Betadine  daily and leave open to air or cover with Silicone foam whichever is preferred.    Podiatry is managing L 4th toe wound.    POC discussed with bedside nurse. WOC team will not follow. Re-consult if further needs arise.    Thank you,    Powell Bar MSN, RN-BC, [employer]

## 2024-07-18 NOTE — Plan of Care (Signed)
  Problem: Education: Goal: Knowledge of General Education information will improve Description: Including pain rating scale, medication(s)/side effects and non-pharmacologic comfort measures Outcome: Progressing   Problem: Clinical Measurements: Goal: Diagnostic test results will improve Outcome: Progressing   Problem: Activity: Goal: Risk for activity intolerance will decrease Outcome: Progressing   Problem: Coping: Goal: Level of anxiety will decrease Outcome: Progressing   Problem: Pain Managment: Goal: General experience of comfort will improve and/or be controlled Outcome: Progressing   Problem: Safety: Goal: Ability to remain free from injury will improve Outcome: Progressing

## 2024-07-18 NOTE — Hospital Course (Signed)
 48 y.o. year old female with past medical history of RA, chronic pain syndrome, PVD, hx of MRSA infections, previous PE on anticoagulation and right transmetatarsal amputation came to the ED with fevers, chill and left foot pain.    Pt states she developed pain in that worsened over the last 2-3 days. She has chronic pain in her legs  On arrival to the ED patient was noted to be HDS stable. Lab work concerning for infectious etiology, pt started on broad spectrum abx and podiatry consulted by EDP

## 2024-07-19 ENCOUNTER — Other Ambulatory Visit: Payer: Self-pay

## 2024-07-19 DIAGNOSIS — G546 Phantom limb syndrome with pain: Secondary | ICD-10-CM

## 2024-07-19 DIAGNOSIS — L03116 Cellulitis of left lower limb: Secondary | ICD-10-CM | POA: Diagnosis not present

## 2024-07-19 LAB — COMPREHENSIVE METABOLIC PANEL WITH GFR
ALT: 20 U/L (ref 0–44)
AST: 17 U/L (ref 15–41)
Albumin: 3.4 g/dL — ABNORMAL LOW (ref 3.5–5.0)
Alkaline Phosphatase: 62 U/L (ref 38–126)
Anion gap: 12 (ref 5–15)
BUN: 15 mg/dL (ref 6–20)
CO2: 23 mmol/L (ref 22–32)
Calcium: 9 mg/dL (ref 8.9–10.3)
Chloride: 104 mmol/L (ref 98–111)
Creatinine, Ser: 0.78 mg/dL (ref 0.44–1.00)
GFR, Estimated: >60 mL/min (ref >60)
Glucose, Bld: 91 mg/dL (ref 70–99)
Potassium: 3.7 mmol/L (ref 3.5–5.1)
Sodium: 139 mmol/L (ref 135–145)
Total Bilirubin: 1.1 mg/dL (ref 0.0–1.2)
Total Protein: 7.5 g/dL (ref 6.5–8.1)

## 2024-07-19 LAB — CBC
HCT: 42.1 % (ref 36.0–46.0)
Hemoglobin: 13.4 g/dL (ref 12.0–15.0)
MCH: 29 pg (ref 26.0–34.0)
MCHC: 31.8 g/dL (ref 30.0–36.0)
MCV: 91.1 fL (ref 80.0–100.0)
Platelets: 219 K/uL (ref 150–400)
RBC: 4.62 MIL/uL (ref 3.87–5.11)
RDW: 14.5 % (ref 11.5–15.5)
WBC: 10 K/uL (ref 4.0–10.5)
nRBC: 0 % (ref 0.0–0.2)

## 2024-07-19 MED ORDER — CEPHALEXIN 500 MG PO CAPS
500.0000 mg | ORAL_CAPSULE | Freq: Two times a day (BID) | ORAL | 0 refills | Status: AC
  Filled 2024-07-19: qty 10, 5d supply, fill #0

## 2024-07-19 MED ORDER — OXYCODONE HCL 5 MG PO TABS
5.0000 mg | ORAL_TABLET | Freq: Four times a day (QID) | ORAL | 0 refills | Status: DC | PRN
  Filled 2024-07-19: qty 20, 5d supply, fill #0

## 2024-07-19 NOTE — Discharge Summary (Signed)
 Physician Discharge Summary   Patient: Regina Carpenter MRN: [id number] DOB: [birth year]  Admit date:     07/17/2024  Discharge date: 07/19/24  Discharge Physician: Regina Carpenter   PCP: Regina Carpenter   Recommendations at discharge:    Follow up with PCP in 1-2 weeks Follow up with Rheumatology as already scheduled  Discharge Diagnoses: Principal Problem:   Cellulitis of left foot Active Problems:   Phantom limb syndrome with pain (HCC) (Right)   Rheumatoid arthritis (HCC)   Essential hypertension  Resolved Problems:   * No resolved hospital problems. *  Hospital Course: 48 y.o. year old female with past medical history of RA, chronic pain syndrome, PVD, hx of MRSA infections, previous PE on anticoagulation and right transmetatarsal amputation came to the ED with fevers, chill and left foot pain.    Pt states she developed pain in that worsened over the last 2-3 days. She has chronic pain in her legs  On arrival to the ED patient was noted to be HDS stable. Lab work concerning for infectious etiology, pt started on broad spectrum abx and podiatry consulted by EDP   Assessment and Plan: Cellulitis Patient with foot infection given erythema and imaging finding of foot pain.  -Pt follows with podiatry and they were consulted --Patient started on Vanc/Unasyn by EDP, given non-pururlent nature pt was changed to IV cefazolin . To complete keflex  on d/c -Vascular Surgeon consulted per Podiatry recs -Pt underwent angiogram 10/15 which was completely diagnostic, no intervention needed -Per Vascular Surgery, OK for pt to d/c back to rheumatology and f/u with podiatry   Chronic problem: Rheumatoid arthritis:  -on methotrexate  qweekly -continued plaquenil  and prednisone    History of PE:  -Continue home Eliquis    Phantom limb pain:  -Continue home Lyrica and Effexor    Hx Essential hypertension:  -not on bp meds PTA -bp very well controlled    Consultants: Podiatry,  Vascular Surgery Procedures performed: Angiogram  Disposition: Home Diet recommendation:  Regular diet DISCHARGE MEDICATION: Allergies as of 07/19/2024       Reactions   Ibuprofen Other (See Comments)   Pt states that she sweats profusely after taking ibuprofen Other reaction(s): Other (See Comments) sweating:   Sulfa  Antibiotics Rash        Medication List     STOP taking these medications    pregabalin 50 MG capsule Commonly known as: LYRICA   saccharomyces boulardii 250 MG capsule Commonly known as: FLORASTOR       TAKE these medications    acetaminophen  500 MG tablet Commonly known as: TYLENOL  Take 1,000 mg by mouth daily.   cephALEXin  500 MG capsule Commonly known as: KEFLEX  Take 1 capsule (500 mg total) by mouth 2 (two) times daily for 5 days.   colchicine  0.6 MG tablet Take 0.6 mg by mouth daily.   Eliquis  5 MG Tabs tablet Generic drug: apixaban  Take 5 mg by mouth 2 (two) times daily.   gabapentin  300 MG capsule Commonly known as: NEURONTIN  Take 300 mg by mouth 2 (two) times daily.   hydroxychloroquine  200 MG tablet Commonly known as: PLAQUENIL  Take 200 mg by mouth 2 (two) times daily.   methotrexate  2.5 MG tablet Commonly known as: RHEUMATREX Take 20 mg by mouth once a week.   predniSONE  10 MG tablet Commonly known as: DELTASONE  Take 1 tablet (10 mg total) by mouth daily with breakfast.   venlafaxine  XR 75 MG 24 hr capsule Commonly known as: EFFEXOR -XR Take 75 mg by mouth daily.  Follow-up Information     [facility], [place] Follow up in 2 week(s).   Why: Hospital follow up Contact information: [address] [place] [address] [phone]         Regina Lady POUR, MD Follow up.   Specialty: Rheumatology Why: as already scheduled Contact information: [address] [place] [address] [phone]                Discharge Exam: Regina Carpenter   07/17/24 1031  Weight: 111.6 kg    General exam: Awake, laying in bed, in nad Respiratory system: Normal respiratory effort, no wheezing Cardiovascular system: regular rate, s1, s2 Gastrointestinal system: Soft, nondistended, positive BS Central nervous system: CN2-12 grossly intact, strength intact Extremities: Perfused, no clubbing Skin: Normal skin turgor, no notable skin lesions seen Psychiatry: Mood normal // no visual hallucinations   Condition at discharge: fair  The results of significant diagnostics from this hospitalization (including imaging, microbiology, ancillary and laboratory) are listed below for reference.   Imaging Studies: PERIPHERAL VASCULAR CATHETERIZATION Result Date: 07/18/2024 See surgical note for result.  DG Tibia/Fibula Left Result Date: 07/18/2024 CLINICAL DATA:  Left shin pain EXAM: DG TIBIA/FIBULA 2V*L* COMPARISON:  None Available. FINDINGS: There is no evidence of fracture or other focal bone lesions. Soft tissues are unremarkable. IMPRESSION: Negative. Electronically Signed   By: Wilkie Lent M.D.   On: 07/18/2024 12:59   DG Foot Complete Left Result Date: 07/17/2024 CLINICAL DATA:  Toe infection EXAM: LEFT FOOT - COMPLETE 3 VIEW COMPARISON:  None Available. FINDINGS: Surgical changes of prior partial amputation of the second and fifth digits. No conventional radiographic evidence of active osteomyelitis. No acute fracture or malalignment. Soft tissue swelling is present over the dorsal forefoot concerning for cellulitis. IMPRESSION: Soft tissue swelling over the distal forefoot concerning for possible cellulitis. Surgical changes of prior partial amputation of the second and fifth digits. No evidence of fracture, malalignment or active osteomyelitis. Electronically Signed   By: Wilkie Lent M.D.   On: 07/17/2024 12:24    Microbiology: Results for orders placed or performed during the hospital encounter of 07/17/24  Blood culture (routine x 2)     Status: None (Preliminary  result)   Collection Time: 07/17/24 11:21 AM   Specimen: BLOOD  Result Value Ref Range Status   Specimen Description BLOOD RIGHT [facility]  Final   Special Requests   Final    BOTTLES DRAWN AEROBIC AND ANAEROBIC Blood Culture adequate volume   Culture   Final    NO GROWTH 2 DAYS Performed at [facility], [address] [place], [place] [address]    Report Status PENDING  Incomplete  Blood culture (routine x 2)     Status: None (Preliminary result)   Collection Time: 07/17/24 11:29 AM   Specimen: BLOOD LEFT ARM  Result Value Ref Range Status   Specimen Description BLOOD LEFT ARM  Final   Special Requests   Final    BOTTLES DRAWN AEROBIC AND ANAEROBIC Blood Culture results may not be optimal due to an inadequate volume of blood received in culture bottles   Culture   Final    NO GROWTH 2 DAYS Performed at [facility], [address] [place], [place] [address]    Report Status PENDING  Incomplete    Labs: CBC: Recent Labs  Lab 07/17/24 1034 07/19/24 0453  WBC 12.0* 10.0  NEUTROABS 7.0  --   HGB 13.3 13.4  HCT 43.2 42.1  MCV 92.5  91.1  PLT 238 219   Basic Metabolic Panel: Recent Labs  Lab 07/17/24 1034 07/19/24 0453  NA 140 139  K 3.9 3.7  CL 105 104  CO2 24 23  GLUCOSE 101* 91  BUN 14 15  CREATININE 0.82 0.78  CALCIUM 9.1 9.0   Liver Function Tests: Recent Labs  Lab 07/17/24 1034 07/19/24 0453  AST 19 17  ALT 20 20  ALKPHOS 66 62  BILITOT 0.9 1.1  PROT 7.4 7.5  ALBUMIN 3.8 3.4*   CBG: No results for input(s): GLUCAP in the last 168 hours.  Discharge time spent: less than 30 minutes.  Signed: Garnette Pelt, MD [facility] 07/19/2024

## 2024-07-19 NOTE — Progress Notes (Signed)
  Progress Note    07/19/2024 9:39 AM 1 Day Post-Op  Subjective:  Regina Carpenter is a 48 yo female now POD #1 from:  Procedure(s) Performed:             1.  Ultrasound guidance for vascular access right femoral artery             2.  Catheter placement into left SFA from right femoral approach             3.  Aortogram and selective left lower extremity angiogram             4.  StarClose closure device right femoral artery  Patient is resting comfortably this morning sitting up in bed eating breakfast.  No complications to note.  Vitals are remained stable.   Vitals:   07/19/24 0459 07/19/24 0825  BP: 114/68 120/74  Pulse: 79 (!) 101  Resp: 18 17  Temp: 98.3 F (36.8 C) 98 F (36.7 C)  SpO2: 94% 99%   Physical Exam: Cardiac:  RRR, normal S1 and S2, no murmurs. Lungs: Lungs clear on auscultation throughout, no rales rhonchi or wheezing. Incisions: Right groin puncture site with dressing clean dry and intact.  No hematoma seroma noted. Extremities: All extremities warm to touch with palpable pulses. Abdomen: Positive bowel sounds throughout, soft, nontender nondistended. Neurologic: Alert and oriented x 3 answers all questions and follows commands appropriately.  CBC    Component Value Date/Time   WBC 10.0 07/19/2024 0453   RBC 4.62 07/19/2024 0453   HGB 13.4 07/19/2024 0453   HCT 42.1 07/19/2024 0453   PLT 219 07/19/2024 0453   MCV 91.1 07/19/2024 0453   MCH 29.0 07/19/2024 0453   MCHC 31.8 07/19/2024 0453   RDW 14.5 07/19/2024 0453   LYMPHSABS 3.7 07/17/2024 1034   MONOABS 1.0 07/17/2024 1034   EOSABS 0.3 07/17/2024 1034   BASOSABS 0.1 07/17/2024 1034    BMET    Component Value Date/Time   NA 139 07/19/2024 0453   K 3.7 07/19/2024 0453   CL 104 07/19/2024 0453   CO2 23 07/19/2024 0453   GLUCOSE 91 07/19/2024 0453   BUN 15 07/19/2024 0453   CREATININE 0.78 07/19/2024 0453   CALCIUM 9.0 07/19/2024 0453   GFRNONAA >60 07/19/2024 0453   GFRAA >60  05/23/2020 0552    INR    Component Value Date/Time   INR 1.2 05/22/2024 0548     Intake/Output Summary (Last 24 hours) at 07/19/2024 0939 Last data filed at 07/18/2024 1900 Gross per 24 hour  Intake 240 ml  Output --  Net 240 ml     Assessment/Plan:  48 y.o. female is s/p SEE ABOVE 1 Day Post-Op   PLAN Patient's angiogram yesterday was completely diagnostic.  No intervention such as stent placement is needed.  Okay for patient to discharge today back to rheumatology and follow-up with podiatry for toe pain. No follow-up with vascular surgery needed at this time.  DVT prophylaxis: Eliquis  5 mg twice daily.   Regina Carpenter [facility] 07/19/2024 9:39 AM

## 2024-07-19 NOTE — Plan of Care (Signed)
  Problem: Education: Goal: Knowledge of General Education information will improve Description: Including pain rating scale, medication(s)/side effects and non-pharmacologic comfort measures Outcome: Progressing   Problem: Clinical Measurements: Goal: Diagnostic test results will improve Outcome: Progressing   Problem: Activity: Goal: Risk for activity intolerance will decrease Outcome: Progressing   Problem: Coping: Goal: Level of anxiety will decrease Outcome: Progressing   Problem: Pain Managment: Goal: General experience of comfort will improve and/or be controlled Outcome: Progressing   Problem: Safety: Goal: Ability to remain free from injury will improve Outcome: Progressing

## 2024-07-22 LAB — CULTURE, BLOOD (ROUTINE X 2)
Culture: NO GROWTH
Culture: NO GROWTH
Special Requests: ADEQUATE

## 2024-08-06 NOTE — Telephone Encounter (Signed)
 Done

## 2024-08-13 ENCOUNTER — Other Ambulatory Visit: Payer: Self-pay

## 2024-08-13 ENCOUNTER — Emergency Department
Admission: EM | Admit: 2024-08-13 | Discharge: 2024-08-13 | Disposition: A | Discharge status: Home / Self Care | Attending: Emergency Medicine | Admitting: Emergency Medicine

## 2024-08-13 DIAGNOSIS — M79671 Pain in right foot: Secondary | ICD-10-CM | POA: Diagnosis present

## 2024-08-13 DIAGNOSIS — L97519 Non-pressure chronic ulcer of other part of right foot with unspecified severity: Secondary | ICD-10-CM | POA: Diagnosis not present

## 2024-08-13 MED ORDER — HYDROCODONE-ACETAMINOPHEN 5-325 MG PO TABS: 1.0000 | ORAL_TABLET | Freq: Four times a day (QID) | ORAL | 0 refills | Status: DC | PRN

## 2024-08-13 MED ORDER — DOXYCYCLINE HYCLATE 100 MG PO TABS
100.0000 mg | ORAL_TABLET | Freq: Once | ORAL | Status: AC
  Administered 2024-08-13: 100 mg via ORAL
  Filled 2024-08-13: qty 1

## 2024-08-13 MED ORDER — DOXYCYCLINE HYCLATE 100 MG PO TABS: 100.0000 mg | ORAL_TABLET | Freq: Two times a day (BID) | ORAL | 0 refills | Status: AC

## 2024-08-13 MED ORDER — HYDROCODONE-ACETAMINOPHEN 5-325 MG PO TABS: 1.0000 | ORAL_TABLET | Freq: Four times a day (QID) | ORAL | 0 refills | Status: AC | PRN

## 2024-08-13 MED ORDER — HYDROCODONE-ACETAMINOPHEN 5-325 MG PO TABS
1.0000 | ORAL_TABLET | Freq: Once | ORAL | Status: AC
  Administered 2024-08-13: 1 via ORAL
  Filled 2024-08-13: qty 1

## 2024-08-13 NOTE — ED Triage Notes (Signed)
 Pt reports ulcers to right leg for the past month

## 2024-08-13 NOTE — ED Provider Notes (Signed)
 [facility] Provider Note    Event Date/Time   First MD Initiated Contact with Patient 08/13/24 0601     (approximate)   History   Skin Ulcer   HPI  Regina Carpenter is a 48 y.o. female with a history of rheumatoid arthritis, cutaneous polyarteritis nodosa, chronic pain, multiple amputations who presents with complaints of painful ulcerations in the right foot.  Patient reports the pain has been worsening her gabapentin  is not helping.  She denies fevers.  Review of records demonstrates admission on October 14 for left lower extremity cellulitis.  She complains only of right-sided pain today     Physical Exam   Triage Vital Signs: ED Triage Vitals  Encounter Vitals Group     BP 08/13/24 0600 (!) 149/102     Girls Systolic BP Percentile --      Girls Diastolic BP Percentile --      Boys Systolic BP Percentile --      Boys Diastolic BP Percentile --      Pulse Rate 08/13/24 0600 (!) 112     Resp 08/13/24 0600 20     Temp 08/13/24 0600 98.6 F (37 C)     Temp src --      SpO2 08/13/24 0600 100 %     Weight 08/13/24 0559 117.5 kg (259 lb)     Height 08/13/24 0559 1.651 m (5' 5)     Head Circumference --      Peak Flow --      Pain Score 08/13/24 0559 10     Pain Loc --      Pain Education --      Exclude from Growth Chart --     Most recent vital signs: Vitals:   08/13/24 0600  BP: (!) 149/102  Pulse: (!) 112  Resp: 20  Temp: 98.6 F (37 C)  SpO2: 100%     General: Awake, no distress.  CV:  Good peripheral perfusion.  Resp:  Normal effort.  Abd:  No distention.  Other:  Patient has deeper ulcerations to the right ower leg on the medial aspect and lateral aspect without clear surrounding erythema or discharge, stump is warm and well-perfused   ED Results / Procedures / Treatments   Labs (all labs ordered are listed, but only abnormal results are displayed) Labs Reviewed - No data to  display   EKG     RADIOLOGY     PROCEDURES:  Critical Care performed:   Procedures   MEDICATIONS ORDERED IN ED: Medications  HYDROcodone -acetaminophen  (NORCO/VICODIN) 5-325 MG per tablet 1 tablet (has no administration in time range)  doxycycline  (VIBRA -TABS) tablet 100 mg (has no administration in time range)     IMPRESSION / MDM / ASSESSMENT AND PLAN / ED COURSE  I reviewed the triage vital signs and the nursing notes. Patient's presentation is most consistent with exacerbation of chronic illness.  Patient with history of polyarteritis nodosa, followed by rheumatology who presents with increased pain and ulcerations of the right lower extremity.  She is tachycardic likely because she is upset and tearful secondary to the near chronic pain that she has from this.  Is not getting relief from her current pain medication  Reviewed medical records from last admission, had angiogram which was overall reassuring  Her foot is warm and well-perfused, question possible wound infection causing increased pain but unclear.  Will trial a course of doxycycline , switch her analgesic to Vicodin to see if this helps better,  she will follow-up with wound clinic and podiatry        FINAL CLINICAL IMPRESSION(S) / ED DIAGNOSES   Final diagnoses:  Ulcer of right foot, unspecified ulcer stage (HCC)     Rx / DC Orders   ED Discharge Orders          Ordered    doxycycline  (VIBRA -TABS) 100 MG tablet  2 times daily        08/13/24 0619    HYDROcodone -acetaminophen  (NORCO/VICODIN) 5-325 MG tablet  Every 6 hours PRN,   Status:  Discontinued        08/13/24 0619    HYDROcodone -acetaminophen  (NORCO/VICODIN) 5-325 MG tablet  Every 6 hours PRN        08/13/24 0620             Note:  This document was prepared using Dragon voice recognition software and may include unintentional dictation errors.   Arlander Charleston, MD 08/13/24 [phone]

## 2024-08-19 ENCOUNTER — Other Ambulatory Visit: Payer: Self-pay

## 2024-08-19 ENCOUNTER — Emergency Department

## 2024-08-19 ENCOUNTER — Inpatient Hospital Stay
Admission: EM | Admit: 2024-08-19 | Discharge: 2024-08-21 | DRG: 603 | Disposition: A | Discharge status: Home / Self Care | Attending: Emergency Medicine | Admitting: Emergency Medicine

## 2024-08-19 DIAGNOSIS — Z89422 Acquired absence of other left toe(s): Secondary | ICD-10-CM

## 2024-08-19 DIAGNOSIS — F17210 Nicotine dependence, cigarettes, uncomplicated: Secondary | ICD-10-CM | POA: Diagnosis present

## 2024-08-19 DIAGNOSIS — M300 Polyarteritis nodosa: Secondary | ICD-10-CM | POA: Diagnosis present

## 2024-08-19 DIAGNOSIS — Z6841 Body Mass Index (BMI) 40.0 and over, adult: Secondary | ICD-10-CM | POA: Diagnosis not present

## 2024-08-19 DIAGNOSIS — I83029 Varicose veins of left lower extremity with ulcer of unspecified site: Secondary | ICD-10-CM | POA: Diagnosis not present

## 2024-08-19 DIAGNOSIS — L97309 Non-pressure chronic ulcer of unspecified ankle with unspecified severity: Principal | ICD-10-CM

## 2024-08-19 DIAGNOSIS — L03115 Cellulitis of right lower limb: Principal | ICD-10-CM | POA: Diagnosis present

## 2024-08-19 DIAGNOSIS — L97929 Non-pressure chronic ulcer of unspecified part of left lower leg with unspecified severity: Secondary | ICD-10-CM | POA: Diagnosis present

## 2024-08-19 DIAGNOSIS — Z7952 Long term (current) use of systemic steroids: Secondary | ICD-10-CM | POA: Diagnosis not present

## 2024-08-19 DIAGNOSIS — Z806 Family history of leukemia: Secondary | ICD-10-CM

## 2024-08-19 DIAGNOSIS — L97919 Non-pressure chronic ulcer of unspecified part of right lower leg with unspecified severity: Secondary | ICD-10-CM | POA: Diagnosis present

## 2024-08-19 DIAGNOSIS — A419 Sepsis, unspecified organism: Secondary | ICD-10-CM | POA: Diagnosis present

## 2024-08-19 DIAGNOSIS — Z79631 Long term (current) use of antimetabolite agent: Secondary | ICD-10-CM | POA: Diagnosis not present

## 2024-08-19 DIAGNOSIS — Z7901 Long term (current) use of anticoagulants: Secondary | ICD-10-CM

## 2024-08-19 DIAGNOSIS — I776 Arteritis, unspecified: Secondary | ICD-10-CM | POA: Diagnosis present

## 2024-08-19 DIAGNOSIS — G8929 Other chronic pain: Secondary | ICD-10-CM | POA: Diagnosis present

## 2024-08-19 DIAGNOSIS — L03116 Cellulitis of left lower limb: Secondary | ICD-10-CM | POA: Diagnosis present

## 2024-08-19 DIAGNOSIS — M069 Rheumatoid arthritis, unspecified: Secondary | ICD-10-CM | POA: Diagnosis present

## 2024-08-19 DIAGNOSIS — L0390 Cellulitis, unspecified: Secondary | ICD-10-CM | POA: Diagnosis present

## 2024-08-19 DIAGNOSIS — I83019 Varicose veins of right lower extremity with ulcer of unspecified site: Secondary | ICD-10-CM | POA: Diagnosis not present

## 2024-08-19 DIAGNOSIS — Z86711 Personal history of pulmonary embolism: Secondary | ICD-10-CM

## 2024-08-19 DIAGNOSIS — Z811 Family history of alcohol abuse and dependence: Secondary | ICD-10-CM

## 2024-08-19 DIAGNOSIS — Z89421 Acquired absence of other right toe(s): Secondary | ICD-10-CM | POA: Diagnosis not present

## 2024-08-19 DIAGNOSIS — Z89411 Acquired absence of right great toe: Secondary | ICD-10-CM | POA: Diagnosis not present

## 2024-08-19 DIAGNOSIS — Z79899 Other long term (current) drug therapy: Secondary | ICD-10-CM

## 2024-08-19 DIAGNOSIS — Z886 Allergy status to analgesic agent status: Secondary | ICD-10-CM | POA: Diagnosis not present

## 2024-08-19 DIAGNOSIS — Z882 Allergy status to sulfonamides status: Secondary | ICD-10-CM | POA: Diagnosis not present

## 2024-08-19 DIAGNOSIS — L97519 Non-pressure chronic ulcer of other part of right foot with unspecified severity: Secondary | ICD-10-CM | POA: Diagnosis present

## 2024-08-19 DIAGNOSIS — L97319 Non-pressure chronic ulcer of right ankle with unspecified severity: Secondary | ICD-10-CM | POA: Diagnosis present

## 2024-08-19 LAB — CBC WITH DIFFERENTIAL/PLATELET
Abs Immature Granulocytes: 0.09 K/uL — ABNORMAL HIGH (ref 0.00–0.07)
Basophils Absolute: 0 K/uL (ref 0.0–0.1)
Basophils Relative: 0 %
Eosinophils Absolute: 0.3 K/uL (ref 0.0–0.5)
Eosinophils Relative: 2 %
HCT: 41.8 % (ref 36.0–46.0)
Hemoglobin: 13.9 g/dL (ref 12.0–15.0)
Immature Granulocytes: 1 %
Lymphocytes Relative: 30 %
Lymphs Abs: 3.5 K/uL (ref 0.7–4.0)
MCH: 30 pg (ref 26.0–34.0)
MCHC: 33.3 g/dL (ref 30.0–36.0)
MCV: 90.3 fL (ref 80.0–100.0)
Monocytes Absolute: 1 K/uL (ref 0.1–1.0)
Monocytes Relative: 8 %
Neutro Abs: 6.8 K/uL (ref 1.7–7.7)
Neutrophils Relative %: 59 %
Platelets: 274 K/uL (ref 150–400)
RBC: 4.63 MIL/uL (ref 3.87–5.11)
RDW: 15.2 % (ref 11.5–15.5)
WBC: 11.7 K/uL — ABNORMAL HIGH (ref 4.0–10.5)
nRBC: 0 % (ref 0.0–0.2)

## 2024-08-19 LAB — LACTIC ACID, PLASMA
Lactic Acid, Venous: 2.6 mmol/L (ref 0.5–1.9)
Lactic Acid, Venous: 1 mmol/L (ref 0.5–1.9)

## 2024-08-19 LAB — COMPREHENSIVE METABOLIC PANEL WITH GFR
ALT: 13 U/L (ref 0–44)
AST: 27 U/L (ref 15–41)
Albumin: 3.8 g/dL (ref 3.5–5.0)
Alkaline Phosphatase: 77 U/L (ref 38–126)
Anion gap: 13 (ref 5–15)
BUN: 11 mg/dL (ref 6–20)
CO2: 20 mmol/L — ABNORMAL LOW (ref 22–32)
Calcium: 9.4 mg/dL (ref 8.9–10.3)
Chloride: 104 mmol/L (ref 98–111)
Creatinine, Ser: 0.85 mg/dL (ref 0.44–1.00)
GFR, Estimated: >60 mL/min (ref >60)
Glucose, Bld: 167 mg/dL — ABNORMAL HIGH (ref 70–99)
Potassium: 4 mmol/L (ref 3.5–5.1)
Sodium: 137 mmol/L (ref 135–145)
Total Bilirubin: 0.7 mg/dL (ref 0.0–1.2)
Total Protein: 7.3 g/dL (ref 6.5–8.1)

## 2024-08-19 LAB — MRSA NEXT GEN BY PCR, NASAL: MRSA by PCR Next Gen: NOT DETECTED

## 2024-08-19 MED ORDER — LACTATED RINGERS IV BOLUS
1000.0000 mL | Freq: Once | INTRAVENOUS | Status: AC
  Administered 2024-08-19: 1000 mL via INTRAVENOUS

## 2024-08-19 MED ORDER — PIPERACILLIN-TAZOBACTAM 3.375 G IVPB
3.3750 g | Freq: Once | INTRAVENOUS | Status: AC
  Administered 2024-08-19: 3.375 g via INTRAVENOUS
  Filled 2024-08-19: qty 50

## 2024-08-19 MED ORDER — HYDROMORPHONE HCL 2 MG PO TABS
1.0000 mg | ORAL_TABLET | ORAL | Status: DC | PRN
  Administered 2024-08-19 – 2024-08-21 (×5): 1 mg via ORAL
  Filled 2024-08-19 (×5): qty 1

## 2024-08-19 MED ORDER — HYDROCODONE-ACETAMINOPHEN 5-325 MG PO TABS
1.0000 | ORAL_TABLET | Freq: Four times a day (QID) | ORAL | Status: DC | PRN
  Administered 2024-08-19 – 2024-08-21 (×5): 1 via ORAL
  Filled 2024-08-19 (×5): qty 1

## 2024-08-19 MED ORDER — APIXABAN 5 MG PO TABS
5.0000 mg | ORAL_TABLET | Freq: Two times a day (BID) | ORAL | Status: DC
  Administered 2024-08-19 – 2024-08-21 (×4): 5 mg via ORAL
  Filled 2024-08-19 (×4): qty 1

## 2024-08-19 MED ORDER — ONDANSETRON HCL 4 MG PO TABS: 4.0000 mg | ORAL_TABLET | Freq: Four times a day (QID) | ORAL | Status: DC | PRN

## 2024-08-19 MED ORDER — HYDROXYCHLOROQUINE SULFATE 200 MG PO TABS
200.0000 mg | ORAL_TABLET | Freq: Two times a day (BID) | ORAL | Status: DC
  Administered 2024-08-19 – 2024-08-21 (×4): 200 mg via ORAL
  Filled 2024-08-19 (×4): qty 1

## 2024-08-19 MED ORDER — VANCOMYCIN HCL 1500 MG/300ML IV SOLN
1500.0000 mg | Freq: Once | INTRAVENOUS | Status: AC
  Administered 2024-08-19: 1500 mg via INTRAVENOUS
  Filled 2024-08-19: qty 300

## 2024-08-19 MED ORDER — DAPSONE 100 MG PO TABS
100.0000 mg | ORAL_TABLET | Freq: Every day | ORAL | Status: DC
  Administered 2024-08-20 – 2024-08-21 (×2): 100 mg via ORAL
  Filled 2024-08-19 (×2): qty 1

## 2024-08-19 MED ORDER — SODIUM CHLORIDE 0.9 % IV SOLN
2.0000 g | Freq: Three times a day (TID) | INTRAVENOUS | Status: DC
  Administered 2024-08-19 – 2024-08-21 (×5): 2 g via INTRAVENOUS
  Filled 2024-08-19 (×6): qty 12.5

## 2024-08-19 MED ORDER — OXYCODONE HCL 5 MG PO TABS
5.0000 mg | ORAL_TABLET | Freq: Once | ORAL | Status: AC
  Administered 2024-08-19: 5 mg via ORAL
  Filled 2024-08-19: qty 1

## 2024-08-19 MED ORDER — VANCOMYCIN HCL IN DEXTROSE 1-5 GM/200ML-% IV SOLN
1000.0000 mg | Freq: Once | INTRAVENOUS | Status: AC
  Administered 2024-08-19: 1000 mg via INTRAVENOUS
  Filled 2024-08-19: qty 200

## 2024-08-19 MED ORDER — COLCHICINE 0.6 MG PO TABS
0.6000 mg | ORAL_TABLET | Freq: Every day | ORAL | Status: DC
  Administered 2024-08-20 – 2024-08-21 (×2): 0.6 mg via ORAL
  Filled 2024-08-19 (×2): qty 1

## 2024-08-19 MED ORDER — CYCLOPHOSPHAMIDE 50 MG PO CAPS: 100.0000 mg | ORAL_CAPSULE | Freq: Two times a day (BID) | ORAL | Status: DC

## 2024-08-19 MED ORDER — GABAPENTIN 300 MG PO CAPS
300.0000 mg | ORAL_CAPSULE | Freq: Two times a day (BID) | ORAL | Status: DC
  Administered 2024-08-19 – 2024-08-21 (×4): 300 mg via ORAL
  Filled 2024-08-19 (×4): qty 1

## 2024-08-19 MED ORDER — VENLAFAXINE HCL ER 75 MG PO CP24
75.0000 mg | ORAL_CAPSULE | Freq: Every day | ORAL | Status: DC
  Administered 2024-08-20 – 2024-08-21 (×2): 75 mg via ORAL
  Filled 2024-08-19 (×2): qty 1

## 2024-08-19 MED ORDER — ONDANSETRON HCL 4 MG/2ML IJ SOLN: 4.0000 mg | Freq: Four times a day (QID) | INTRAMUSCULAR | Status: DC | PRN

## 2024-08-19 MED ORDER — PREDNISONE 20 MG PO TABS
40.0000 mg | ORAL_TABLET | Freq: Every day | ORAL | Status: DC
  Administered 2024-08-20 – 2024-08-21 (×2): 40 mg via ORAL
  Filled 2024-08-19 (×2): qty 2

## 2024-08-19 NOTE — Progress Notes (Signed)
   08/19/24 1606  Vitals  Temp 98.7 F (37.1 C)  Temp Source Oral  BP 127/89  MAP (mmHg) 101  BP Location Right Arm  BP Method Automatic  Patient Position (if appropriate) Lying  Pulse Rate (!) 113  Pulse Rate Source Monitor  Resp 16  MEWS COLOR  MEWS Score Color Yellow  Oxygen Therapy  SpO2 100 %  O2 Device Room Air  MEWS Score  MEWS Temp 0  MEWS Systolic 0  MEWS Pulse 2  MEWS RR 0  MEWS LOC 0  MEWS Score 2

## 2024-08-19 NOTE — Consult Note (Signed)
 CODE SEPSIS - PHARMACY COMMUNICATION  **Broad Spectrum Antibiotics should be administered within 1 hour of Sepsis diagnosis**  Time Code Sepsis Called/Page Received: 1338  Antibiotics Ordered: Vancomycin  2,500 mg x1, Zosyn  3.375G x1  Time of 1st antibiotic administration: 1412  Additional action taken by pharmacy: none  If necessary, Name of Provider/Nurse Contacted: n/a    Annabella LOISE Banks ,PharmD Clinical Pharmacist  08/19/2024  2:02 PM

## 2024-08-19 NOTE — Consult Note (Signed)
 WOC Nurse Consult Note: patient is known to [facility] team from previous admissions with vasculitis; followed by rheumatology and podiatry; has been referred to [facility] and vascular per most recent rheumatology notes  Has history of R TMA and 2nd and 5th digit amputations on L foot  Reason for Consult:B lower leg wounds  Wound type: full thickness r/t vasculitis  Pressure Injury POA: NA  Measurement: see nursing flowsheet  Wound azi:drjuuzmzi ulcerations on B lower leg; R medial foot  and ankle 75% tan 25% red; lower leg wounds with eschar  Drainage (amount, consistency, odor) see nursing flowsheet  Periwound: areas of blistering noted and old healed wound beds  Dressing procedure/placement/frequency: Cleanse B lower leg/foot/ankle wounds with Vashe, do not rinse and allow to air dry.  Apply Xeroform gauze ([place] [phone]) to wound beds daily and secure with silicone foam or Kerlix roll gauze whichever patient can tolerate/permit.   WOC team will not follow. Patient should continue with podiatry, rheumatology and [facility] wound care/vascular center.    Thank you,    Powell Bar MSN, RN-BC, [employer]

## 2024-08-19 NOTE — ED Notes (Signed)
RN aware of bed assignment 

## 2024-08-19 NOTE — Consult Note (Signed)
 ED Pharmacy Antibiotic Sign Off An antibiotic consult was received from an ED provider for sepsis per pharmacy dosing for Vancomycin . A chart review was completed to assess appropriateness.   The following one time order(s) were placed:  Vancomycin  2,500 mg x1  Further antibiotic and/or antibiotic pharmacy consults should be ordered by the admitting provider if indicated.   Thank you for allowing pharmacy to be a part of this patient's care.   Annabella LOISE Banks, [facility]  Clinical Pharmacist 08/19/24 2:01 PM

## 2024-08-19 NOTE — ED Provider Notes (Signed)
 [facility] Provider Note    Event Date/Time   First MD Initiated Contact with Patient 08/19/24 1329     (approximate)   History   Skin Ulcer   HPI  Regina Carpenter is a 48 y.o. female with rheumatoid arthritis, polyarteritis nodosa, chronic pain who comes in with ulceration of the right foot.  I reviewed the note where patient was seen on 11/10 and treated with hydrocodone , doxycycline .    I reviewed a note from October 14 where patient was treated for left lower extremity cellulitis.  I also reviewed a note from 8/18 where patient was treated for painful swelling and redness of the right foot stump.  Met sepsis criteria at that time and started on antibiotics and was discharged on Augmentin  and doxycycline    Patient reports having skin ulcer on the right leg which she thinks is infected she reports that she has been compliant with the antibiotic.  Patient reports increasing pain and redness and warmth as well as some drainage from the ulcers.  She reports been compliant with antibiotics but given worsening symptoms she presented to the emergency room.  She reports fevers as well at home.  Physical Exam   Triage Vital Signs: ED Triage Vitals  Encounter Vitals Group     BP 08/19/24 1155 (!) 123/102     Girls Systolic BP Percentile --      Girls Diastolic BP Percentile --      Boys Systolic BP Percentile --      Boys Diastolic BP Percentile --      Pulse Rate 08/19/24 1155 (!) 125     Resp 08/19/24 1155 20     Temp 08/19/24 1155 99.8 F (37.7 C)     Temp Source 08/19/24 1155 Oral     SpO2 08/19/24 1155 99 %     Weight 08/19/24 1156 259 lb 0.7 oz (117.5 kg)     Height 08/19/24 1156 5' 5 (1.651 m)     Head Circumference --      Peak Flow --      Pain Score 08/19/24 1155 10     Pain Loc --      Pain Education --      Exclude from Growth Chart --     Most recent vital signs: Vitals:   08/19/24 1155 08/19/24 1316  BP: (!) 123/102 121/75   Pulse: (!) 125 (!) 112  Resp: 20 18  Temp: 99.8 F (37.7 C)   SpO2: 99% 99%     General: Awake, no distress.  CV:  Good peripheral perfusion.  Resp:  Normal effort.  Abd:  No distention.  Other:  Patient is able to flex and extend the ankle good distal pulse but she has got some ulcerations noted on the medial aspect with no active drainage coming from them but leg does appear warm and slightly red compared to the other side.  ED Results / Procedures / Treatments   Labs (all labs ordered are listed, but only abnormal results are displayed) Labs Reviewed  LACTIC ACID, PLASMA - Abnormal; Notable for the following components:      Result Value   Lactic Acid, Venous 2.6 (*)    All other components within normal limits  COMPREHENSIVE METABOLIC PANEL WITH GFR - Abnormal; Notable for the following components:   CO2 20 (*)    Glucose, Bld 167 (*)    All other components within normal limits  CBC WITH DIFFERENTIAL/PLATELET - Abnormal; Notable for  the following components:   WBC 11.7 (*)    Abs Immature Granulocytes 0.09 (*)    All other components within normal limits  CULTURE, BLOOD (SINGLE)  LACTIC ACID, PLASMA     EKG  My interpretation of EKG:    RADIOLOGY I have reviewed the xray personally and interpreted    PROCEDURES:  Critical Care performed: no   .1-3 Lead EKG Interpretation  Performed by: Ernest Ronal BRAVO, MD Authorized by: Ernest Ronal BRAVO, MD     Interpretation: abnormal     ECG rate:  105   ECG rate assessment: tachycardic     Rhythm: sinus tachycardia     Ectopy: none     Conduction: normal      MEDICATIONS ORDERED IN ED: Medications  vancomycin  (VANCOCIN ) IVPB 1000 mg/200 mL premix (1,000 mg Intravenous New Bag/Given 08/19/24 1426)    Followed by  vancomycin  (VANCOREADY) IVPB 1500 mg/300 mL (1,500 mg Intravenous New Bag/Given 08/19/24 1428)  lactated ringers  bolus 1,000 mL ( Intravenous Restarted 08/19/24 1428)  piperacillin -tazobactam (ZOSYN )  IVPB 3.375 g (0 g Intravenous Stopped 08/19/24 1425)  oxyCODONE  (Oxy IR/ROXICODONE ) immediate release tablet 5 mg (5 mg Oral Given 08/19/24 1420)     IMPRESSION / MDM / ASSESSMENT AND PLAN / ED COURSE  I reviewed the triage vital signs and the nursing notes.   Patient's presentation is most consistent with acute presentation with potential threat to life or bodily function.   Patient comes in with redness, warmth to the right foot.  Good distal pulse unlikely arterial issue concern for cellulitis.  History of MRSA will cover broadly with broad-spectrum antibiotics, Vanco and Zosyn  treated pain with oxycodone .  Patient does have low-grade temperatures, tachycardia elevated lactate  CBC shows elevated white count 11.7.  Lactate elevated 2.6.  CMP reassuring  Given patient's tachycardia, worsening symptoms while on doxycycline  will discuss possible team for admission for cellulitis.  X-ray ordered to ensure no osteo no evidence of septic joint based upon examination  The patient is on the cardiac monitor to evaluate for evidence of arrhythmia and/or significant heart rate changes.      FINAL CLINICAL IMPRESSION(S) / ED DIAGNOSES   Final diagnoses:  Ulcer of ankle, unspecified laterality, unspecified ulcer stage (HCC)  Cellulitis, unspecified cellulitis site     Rx / DC Orders   ED Discharge Orders     None        Note:  This document was prepared using Dragon voice recognition software and may include unintentional dictation errors.   Ernest Ronal BRAVO, MD 08/19/24 [phone]

## 2024-08-19 NOTE — ED Triage Notes (Signed)
 Pt c/o of skin ulcers on right leg and she thinks they are infected because they are changing colors. Pt states they were clear but now they have a yellow film over them. Pt states she does have drainage from the ulcers, but cannot tell me the color. Seen on the 10th for the same, states antibiotics were sent in and she has been taking them. She cannot tell me the name of the antibiotics. Initially states nothing was done for her on the 10th.

## 2024-08-19 NOTE — H&P (Addendum)
 History and Physical    Regina Carpenter FMW:[id number] DOB: Nov 21, 1975 DOA: 08/19/2024  PCP: [facility] (Confirm with patient/family/NH records and if not entered, this has to be entered at [facility] point of entry) Patient coming from: Home   I have personally briefly reviewed patient's old medical records in [facility]  Chief Complaint: Leg pain, worsening of infection  HPI: Regina Carpenter is a 48 y.o. 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  Symptoms started about 1 week ago, patient started to develop rash and pain associated with bilateral lower extremity wounds, came to ED, when she was diagnosed with bilateral lower extremity cellulitis and sent home with pain medications and doxycycline .  She took doxycycline  for 6 days however she felt that the rash and pain have not improved much and decided to come back to the hospital.  She reported episodes of subjective fever chills.  ED Course: Afebrile,: Tachycardia heart rate 100-1 tens, blood pressure 120/70 all exertion 99% on room air.  Blood work showed WBC 11.7 hemoglobin 13.9 platelet 274 BUN 11 creatinine 0.8 glucose 167.  Patient was given Zosyn  in the ED.  Review of Systems: As per HPI otherwise 14 point review of systems negative.    Past Medical History:  Diagnosis Date   Arthralgia of right knee    Arthritis    Cellulitis of left leg    Collagen vascular disease    Lymphadenopathy, mesenteric    Right hand pain    Tobacco abuse     Past Surgical History:  Procedure Laterality Date   denies     LOWER EXTREMITY ANGIOGRAPHY Bilateral 05/19/2020   Procedure: Lower Extremity Angiography;  Surgeon: Marea Selinda RAMAN, MD;  Location: [facility] INVASIVE CV LAB;  Service: Cardiovascular;  Laterality: Bilateral;   LOWER EXTREMITY ANGIOGRAPHY Left 07/18/2024   Procedure: Lower Extremity Angiography;  Surgeon: Marea Selinda RAMAN, MD;  Location: [facility] INVASIVE CV LAB;  Service: Cardiovascular;  Laterality: Left;   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   Procedure: TRANSMETATARSAL AMPUTATION;  Surgeon: Neill Boas, DPM;  Location: [facility] ORS;  Service: Podiatry;  Laterality: Right;     reports that she has been smoking cigarettes. She has never used smokeless tobacco. She reports that she does not drink alcohol and does not use drugs.  Allergies  Allergen Reactions   Ibuprofen Other (See Comments)    Pt states that she sweats profusely after taking ibuprofen Other reaction(s): Other (See Comments) sweating:   Sulfa  Antibiotics Rash    Family History  Problem Relation Age of Onset   Leukemia Maternal Grandfather    Alcohol abuse Father      Prior to Admission medications   Medication Sig Start Date End Date Taking? Authorizing Provider  acetaminophen  (TYLENOL ) 500 MG tablet Take 1,000 mg by mouth daily. 05/09/21   [provider]  colchicine  0.6 MG tablet Take 0.6 mg by mouth daily. 03/02/24   [provider]  doxycycline  (VIBRA -TABS) 100 MG tablet Take 1 tablet (100 mg total) by mouth 2 (two) times daily. 08/13/24   Arlander Charleston, MD  ELIQUIS  5 MG TABS tablet Take 5 mg by mouth 2 (two) times daily. 03/25/21   [provider]  gabapentin  (NEURONTIN ) 300 MG capsule Take 300 mg by  mouth 2 (two) times daily.    [provider]  HYDROcodone -acetaminophen  (NORCO/VICODIN) 5-325 MG tablet Take 1 tablet by mouth every 6 (six) hours as needed for moderate pain (pain score 4-6). 08/13/24   Arlander Charleston, MD  hydroxychloroquine  (PLAQUENIL ) 200 MG tablet Take 200 mg by mouth 2 (two) times daily.    [provider]  methotrexate  (RHEUMATREX) 2.5 MG tablet Take 20 mg by  mouth once a week.    [provider]  oxyCODONE  (OXY IR/ROXICODONE ) 5 MG immediate release tablet Take 1 tablet (5 mg total) by mouth every 6 (six) hours as needed for breakthrough pain or severe pain (pain score 7-10). 07/19/24   Cindy Garnette POUR, MD  predniSONE  (DELTASONE ) 10 MG tablet Take 1 tablet (10 mg total) by mouth daily with breakfast. 05/24/24   Caleen Qualia, MD  venlafaxine  XR (EFFEXOR -XR) 75 MG 24 hr capsule Take 75 mg by mouth daily.    [provider]    Physical Exam: Vitals:   08/19/24 1316 08/19/24 1330 08/19/24 1350 08/19/24 1445  BP: 121/75 129/73    Pulse: (!) 112 (!) 109  (!) 104  Resp: 18     Temp:      TempSrc:      SpO2: 99% 96% 98% 100%  Weight:      Height:        Constitutional: NAD, calm, comfortable Vitals:   08/19/24 1316 08/19/24 1330 08/19/24 1350 08/19/24 1445  BP: 121/75 129/73    Pulse: (!) 112 (!) 109  (!) 104  Resp: 18     Temp:      TempSrc:      SpO2: 99% 96% 98% 100%  Weight:      Height:       Eyes: PERRL, lids and conjunctivae normal ENMT: Mucous membranes are moist. Posterior pharynx clear of any exudate or lesions.Normal dentition.  Neck: normal, supple, no masses, no thyromegaly Respiratory: clear to auscultation bilaterally, no wheezing, no crackles. Normal respiratory effort. No accessory muscle use.  Cardiovascular: Regular rate and rhythm, no murmurs / rubs / gallops. No extremity edema. 2+ pedal pulses. No carotid bruits.  Abdomen: no tenderness, no masses palpated. No hepatosplenomegaly. Bowel sounds positive.  Musculoskeletal: no clubbing / cyanosis. No joint deformity upper and lower extremities. Good ROM, no contractures. Normal muscle tone.  Skin: Multiple nonhealing ulcers of bilateral lower extremity below the knees, more significant on the right lateral calf area, with eschar covering and severe tenderness surrounding the ulcer and some bleeding at the bottoms. Neurologic: CN 2-12 grossly intact.  Sensation intact, DTR normal. Strength 5/5 in all 4.  Psychiatric: Normal judgment and insight. Alert and oriented x 3. Normal mood.          Labs on Admission: I have personally reviewed following labs and imaging studies  CBC: Recent Labs  Lab 08/19/24 1202  WBC 11.7*  NEUTROABS 6.8  HGB 13.9  HCT 41.8  MCV 90.3  PLT 274   Basic Metabolic Panel: Recent Labs  Lab 08/19/24 1202  NA 137  K 4.0  CL 104  CO2 20*  GLUCOSE 167*  BUN 11  CREATININE 0.85  CALCIUM 9.4   GFR: Estimated Creatinine Clearance: 102.6 mL/min (by C-G formula based on SCr of 0.85 mg/dL). Liver Function Tests: Recent Labs  Lab 08/19/24 1202  AST 27  ALT 13  ALKPHOS 77  BILITOT 0.7  PROT 7.3  ALBUMIN 3.8   No results for input(s): LIPASE, AMYLASE in the last  168 hours. No results for input(s): AMMONIA in the last 168 hours. Coagulation Profile: No results for input(s): INR, PROTIME in the last 168 hours. Cardiac Enzymes: No results for input(s): CKTOTAL, CKMB, CKMBINDEX, TROPONINI in the last 168 hours. BNP (last 3 results) No results for input(s): PROBNP in the last 8760 hours. HbA1C: No results for input(s): HGBA1C in the last 72 hours. CBG: No results for input(s): GLUCAP in the last 168 hours. Lipid Profile: No results for input(s): CHOL, HDL, LDLCALC, TRIG, CHOLHDL, LDLDIRECT in the last 72 hours. Thyroid Function Tests: No results for input(s): TSH, T4TOTAL, FREET4, T3FREE, THYROIDAB in the last 72 hours. Anemia Panel: No results for input(s): VITAMINB12, FOLATE, FERRITIN, TIBC, IRON, RETICCTPCT in the last 72 hours. Urine analysis:    Component Value Date/Time   COLORURINE STRAW (A) 05/22/2024 0515   APPEARANCEUR CLEAR (A) 05/22/2024 0515   LABSPEC 1.006 05/22/2024 0515   PHURINE 5.0 05/22/2024 0515   GLUCOSEU NEGATIVE 05/22/2024 0515   HGBUR NEGATIVE 05/22/2024 0515   BILIRUBINUR NEGATIVE 05/22/2024 0515    KETONESUR NEGATIVE 05/22/2024 0515   PROTEINUR NEGATIVE 05/22/2024 0515   NITRITE NEGATIVE 05/22/2024 0515   LEUKOCYTESUR NEGATIVE 05/22/2024 0515    Radiological Exams on Admission: DG Ankle Complete Right Result Date: 08/19/2024 CLINICAL DATA:  Possible soft tissue infection right lower leg. EXAM: RIGHT ANKLE - COMPLETE 3+ VIEW COMPARISON:  Right ankle 05/21/2024 FINDINGS: Evidence of patient's transmetatarsal amputations unchanged. Ankle mortise is normal. No acute fracture or dislocation. No bone destruction to suggest osteomyelitis. Suggestion of patient's soft tissue ulcerations over the mid to distal lower leg as well as at the level of the ankle laterally. IMPRESSION: 1. No acute fracture or dislocation. No evidence of osteomyelitis. 2. Soft tissue changes over the mid to distal lower leg and ankle laterally likely corresponding to patient's reported ulcerations. Electronically Signed   By: Toribio Agreste M.D.   On: 08/19/2024 15:13    EKG: Independently reviewed.  Sinus tachycardia, no acute ST changes.  Assessment/Plan Principal Problem:   Cellulitis Active Problems:   Bilateral lower leg cellulitis  (please populate well all problems here in Problem List. (For example, if patient is on BP meds at home and you resume or decide to hold them, it is a problem that needs to be her. Same for CAD, COPD, HLD and so on)  Sepsis without acute endorgan damage Worsening of bilateral lower extremity cellulitis and infected ulcers, failed outpatient management - Sepsis as evidenced by tachycardia, leukocytosis, source of infection is worsening of bilateral lower extremity cellulitis and infected ulcers. - Given and patient is immune suppressed, there is concern about resistant organism infection, will continue cefepime , low suspicion for MRSA infection, hold off vancomycin . -Received initial ED IV bolus resuscitation, appears to be euvolemic at this point, will not continue maintenance IV  fluid. - Consult wound care - Went through patient record, patient has a bilateral lower extremity ulcers as early as 2021 underwent extensive workup including arteriogram, eventually underwent right foot transmetatarsal imitation.  The wound is strongly associated with PAN, for which treatment followed by rheumatology at [facility] and the most recent visit was less than 1 month ago.  Recommend that patient go back to outpatient rheumatology to continue follow-up and set up [facility] wound care center care as soon as possible.  Polyarteritis nodosa History of ischemic digitalis status post left 2nd and 5th toes amputation and right-sided transmetatarsal potation Rheumatoid arthritis - According to recent office note from [facility] rheumatology, her medication  DMARDs, were adjusted, patient was started on cyclophosphamide  and dapsone, continued on hydroxychloroquine , and prednisone  40 mg daily. Weekly methotrexate  is on hold while on cyclophosphamide .  History of PE - On Eliquis   Cigarette smoking - Cessation education performed at bedside - Patient appears to have no interest in quitting smoking  DVT prophylaxis: Eliquis  Code Status: Full code Family Communication: None at bedside Disposition Plan: Expect more than 2 midnight hospital stay as patient has a worsening of bilateral lower extremity cellulitis failed outpatient management now requiring IV antibiotics. Consults called: None Admission status: MedSurg admission   Cort ONEIDA Mana MD [facility] Pager [phone]  08/19/2024, 3:33 PM

## 2024-08-20 DIAGNOSIS — L0390 Cellulitis, unspecified: Secondary | ICD-10-CM | POA: Diagnosis not present

## 2024-08-20 DIAGNOSIS — M300 Polyarteritis nodosa: Secondary | ICD-10-CM | POA: Diagnosis not present

## 2024-08-20 DIAGNOSIS — F17210 Nicotine dependence, cigarettes, uncomplicated: Secondary | ICD-10-CM | POA: Diagnosis not present

## 2024-08-20 DIAGNOSIS — L03116 Cellulitis of left lower limb: Secondary | ICD-10-CM

## 2024-08-20 DIAGNOSIS — Z86711 Personal history of pulmonary embolism: Secondary | ICD-10-CM

## 2024-08-20 DIAGNOSIS — A419 Sepsis, unspecified organism: Secondary | ICD-10-CM

## 2024-08-20 MED ORDER — CYCLOPHOSPHAMIDE 25 MG PO CAPS
100.0000 mg | ORAL_CAPSULE | Freq: Two times a day (BID) | ORAL | Status: DC
  Administered 2024-08-20 – 2024-08-21 (×2): 100 mg via ORAL
  Filled 2024-08-20 (×3): qty 4

## 2024-08-20 NOTE — Assessment & Plan Note (Signed)
 Patient met sepsis criteria with tachycardia and leukocytosis, likely due to bilateral lower extremity cellulitis with chronic wounds.  No obvious discharge. Wound care was consulted. Patient did receive IV fluid in ED. - Continue with cefepime  -Continue with wound care

## 2024-08-20 NOTE — Progress Notes (Signed)
 Progress Note   Patient: Regina Carpenter FMW:[id number] DOB: April 29, 1976 DOA: 08/19/2024     1 DOS: the patient was seen and examined on 08/20/2024   Brief hospital course: Partly taken from H&P.  Regina Carpenter is a 48 y.o. 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   She presented to ED about a week ago with similar symptoms and was given doxycycline , she took doxycycline  for 6 days with no improvement.  On presentation she was mildly tachycardic, otherwise stable vitals.  Labs with WBC 11.7, rest stable.  X-ray of right ankle was negative for any acute fracture or osteomyelitis.  Per chart review patient has a bilateral lower extremity ulcers as early as 2021 underwent extensive workup including arteriogram, eventually underwent right foot transmetatarsal imitation. The wound is strongly associated with PAN, for which treatment followed by rheumatology at [facility] and the most recent visit was less than 1 month ago. Recommend that patient go back to outpatient rheumatology to continue follow-up and set up [facility] wound care center care as soon as possible.   She was started on cefepime .  Wound care was consulted.  11/17: Vital stable, MRSA PCR negative, Preliminary blood cultures negative, no definitive sign of ongoing infection, likely some exacerbation of vasculitis.  PT and OT evaluation ordered  Assessment and Plan: * Sepsis due to cellulitis [facility]) Patient met sepsis criteria with tachycardia and leukocytosis, likely due to bilateral lower extremity cellulitis with chronic wounds.  No obvious discharge. Wound care was consulted. Patient did receive IV fluid in ED. - Continue with  cefepime  -Continue with wound care  Polyarteritis nodosa (HCC) Patient has history of collagen vascular disease and polyarteritis nodosa with chronic lower extremity wound secondary to vasculitis.  S/p left 2nd and 5th toes amputation and right-sided TMA.  According to recent office note from [facility] rheumatology, her medication DMARDs, were adjusted, patient was started on cyclophosphamide  and dapsone, continued on hydroxychloroquine , and prednisone  40 mg daily. Weekly methotrexate  is on hold while on cyclophosphamide .   -Continue home medications - Needed close follow-up with her rheumatologist for further assistance  History of pulmonary embolism - Continue home Eliquis   Continuous dependence on cigarette smoking Counseling was provided. -Nicotine  patch as needed   Subjective: Patient continued to have lower extremity pain.  No recent change in her wounds.  Physical Exam: Vitals:   08/19/24 1606 08/19/24 2004 08/20/24 0425 08/20/24 0812  BP: 127/89 129/82 (!) 119/90 97/66  Pulse: (!) 113 (!) 102 (!) 110 64  Resp: 16 16 16 18   Temp: 98.7 F (37.1 C) 98.7 F (37.1 C) 99.7 F (37.6 C) 99.3 F (37.4 C)  TempSrc: Oral   Oral  SpO2: 100% 100% 100% (!) 89%  Weight:      Height:       General.  Obese lady, in no acute distress. Pulmonary.  Lungs clear bilaterally, normal respiratory effort. CV.  Regular rate and rhythm, no JVD, rub or murmur. Abdomen.  Soft, nontender, nondistended, BS positive. CNS.  Alert and oriented .  No focal neurologic deficit. Extremities.  Right TMA, missing couple of toes on right, no edema or erythema, multiple wounds in different stages. Psychiatry.  Judgment and insight appears normal.   Data Reviewed: Prior data reviewed.  Family Communication:  Discussed with patient  Disposition: Status is: Inpatient Remains inpatient appropriate because: Severity of illness  Planned Discharge Destination: Home  DVT prophylaxis.  Eliquis  Time spent: 50  minutes  This record has been created using Conservation officer, historic buildings. Errors have been sought and corrected,but may not always be located. Such creation errors do not reflect on the standard of care.   Author: Amaryllis Dare, MD 08/20/2024 2:22 PM  For on call review [email or web address].

## 2024-08-20 NOTE — Assessment & Plan Note (Signed)
 Continue home Eliquis

## 2024-08-20 NOTE — Plan of Care (Signed)

## 2024-08-20 NOTE — Assessment & Plan Note (Signed)
Counseling was provided. -Nicotine patch as needed 

## 2024-08-20 NOTE — Assessment & Plan Note (Addendum)
 Patient has history of collagen vascular disease and polyarteritis nodosa with chronic lower extremity wound secondary to vasculitis.  S/p left 2nd and 5th toes amputation and right-sided TMA.  According to recent office note from [facility] rheumatology, her medication DMARDs, were adjusted, patient was started on cyclophosphamide  and dapsone, continued on hydroxychloroquine , and prednisone  40 mg daily. Weekly methotrexate  is on hold while on cyclophosphamide .   -Continue home medications - Needed close follow-up with her rheumatologist for further assistance

## 2024-08-20 NOTE — Evaluation (Signed)
 Physical Therapy Evaluation Patient Details Name: Regina Carpenter MRN: [id number] DOB: 18-Nov-1975 Today's Date: 08/20/2024  History of Present Illness  Regina Carpenter is a 48 y.o. 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  Clinical Impression  Patient noted to be in supine position at PT arrival in room, for an initial PT evaluation due to a decline in functional status, with baseline mobility reported as modI, and currently requiring supervision/modI for all mobility. The patient is A&O x 4, presenting with good willingness to work with PT and goals of returning home. The patient resides in a house and lives house with family/friend support. There are 2 small steps to enter and inside the residence. Gait was assessed with no AD. The overall clinical impression is that the patient presents with minimal mobility limitations secondary to recent medical complication. PT recommendation to d/c home with no PT follow up upon medical clearance. Pt has no need for skilled therapy.        If plan is discharge home, recommend the following: A little help with walking and/or transfers;A little help with bathing/dressing/bathroom;Help with stairs or ramp for entrance   Can travel by private vehicle        Equipment Recommendations BSC/3in1  Recommendations for Other Services       Functional Status Assessment Patient has not had a recent decline in their functional status     Precautions / Restrictions Precautions Precautions: None Restrictions Weight Bearing Restrictions Per Provider Order: No      Mobility  Bed Mobility Overal bed mobility: Needs Assistance Bed Mobility: Supine to  Sit     Supine to sit: Supervision, Modified independent (Device/Increase time)          Transfers Overall transfer level: Needs assistance Equipment used: 1 person hand held assist Transfers: Sit to/from Stand Sit to Stand: Supervision, Modified independent (Device/Increase time)                Ambulation/Gait Ambulation/Gait assistance: Supervision, Modified independent (Device/Increase time) Gait Distance (Feet): 40 Feet Assistive device: 1 person hand held assist Gait Pattern/deviations: WFL(Within Functional Limits)          Stairs            Wheelchair Mobility     Tilt Bed    Modified Rankin (Stroke Patients Only)       Balance Overall balance assessment: Needs assistance   Sitting balance-Leahy Scale: Normal     Standing balance support: During functional activity Standing balance-Leahy Scale: Good                               Pertinent Vitals/Pain Pain Assessment Pain Assessment: No/denies pain    Home Living Family/patient expects to be discharged to:: Private residence Living Arrangements: Non-relatives/Friends Available Help at Discharge: Family;Available PRN/intermittently Type of Home: House Home Access: Level entry;Stairs to enter Entrance Stairs-Rails: Can reach both Entrance Stairs-Number of Steps: 2   Home Layout: Two level Home Equipment: None      Prior Function Prior Level of Function : Independent/Modified Independent                     Extremity/Trunk Assessment  Lower Extremity Assessment Lower Extremity Assessment: Overall WFL for tasks assessed;Generalized weakness    Cervical / Trunk Assessment Cervical / Trunk Assessment: Kyphotic  Communication   Communication Communication: No apparent difficulties    Cognition Arousal: Alert Behavior During Therapy: WFL for tasks assessed/performed   PT - Cognitive impairments: No apparent impairments                          Following commands: Intact       Cueing Cueing Techniques: Verbal cues     General Comments      Exercises     Assessment/Plan    PT Assessment Patient does not need any further PT services  PT Problem List         PT Treatment Interventions      PT Goals (Current goals can be found in the Care Plan section)  Acute Rehab PT Goals Patient Stated Goal: Pt wants to go home PT Goal Formulation: With patient Time For Goal Achievement: 09/17/24    Frequency       Co-evaluation               AM-PAC PT 6 Clicks Mobility  Outcome Measure Help needed turning from your back to your side while in a flat bed without using bedrails?: None Help needed moving from lying on your back to sitting on the side of a flat bed without using bedrails?: None Help needed moving to and from a bed to a chair (including a wheelchair)?: None Help needed standing up from a chair using your arms (e.g., wheelchair or bedside chair)?: None Help needed to walk in hospital room?: None Help needed climbing 3-5 steps with a railing? : None 6 Click Score: 24    End of Session   Activity Tolerance: Patient tolerated treatment well Patient left: with nursing/sitter in room;with call bell/phone within reach Nurse Communication: Mobility status PT Visit Diagnosis: Other abnormalities of gait and mobility (R26.89)    Time: 9654-9644 PT Time Calculation (min) (ACUTE ONLY): 10 min   Charges:   PT Evaluation $PT Eval Low Complexity: 1 Low   PT General Charges $$ ACUTE PT VISIT: 1 Visit         Sherlean Lesches DPT, PT    Teyah Rossy A Rudine Rieger 08/20/2024, 4:05 PM

## 2024-08-20 NOTE — Hospital Course (Addendum)
 Partly taken from H&P.  Regina Carpenter is a 48 y.o. 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   She presented to ED about a week ago with similar symptoms and was given doxycycline , she took doxycycline  for 6 days with no improvement.  On presentation she was mildly tachycardic, otherwise stable vitals.  Labs with WBC 11.7, rest stable.  X-ray of right ankle was negative for any acute fracture or osteomyelitis.  Per chart review patient has a bilateral lower extremity ulcers as early as 2021 underwent extensive workup including arteriogram, eventually underwent right foot transmetatarsal imitation. The wound is strongly associated with PAN, for which treatment followed by rheumatology at [facility] and the most recent visit was less than 1 month ago. Recommend that patient go back to outpatient rheumatology to continue follow-up and set up [facility] wound care center care as soon as possible.   She was started on cefepime .  Wound care was consulted.  11/17: Vital stable, MRSA PCR negative, Preliminary blood cultures negative, no definitive sign of ongoing infection, likely some exacerbation of vasculitis.  PT and OT with no F/U recommendations.  11/18: Remained hemodynamically stable, leukocytosis improving.  Patient received cefepime , less likely an infectious cause but we decided to complete the course of Augmentin  and doxycycline  which was already given to her and she has some pills left.  We increase the dose of prednisone  to 40 mg for the next 5 days and then she will resume her home dose.  She was given a referral for local wound care clinic at her request.  She was also instructed to have a close  follow-up with her rheumatologist for further assistance.  She will continue with her home medications and follow-up with her providers for further management.

## 2024-08-21 DIAGNOSIS — L0390 Cellulitis, unspecified: Secondary | ICD-10-CM | POA: Diagnosis not present

## 2024-08-21 DIAGNOSIS — I83029 Varicose veins of left lower extremity with ulcer of unspecified site: Secondary | ICD-10-CM

## 2024-08-21 DIAGNOSIS — L97309 Non-pressure chronic ulcer of unspecified ankle with unspecified severity: Secondary | ICD-10-CM

## 2024-08-21 DIAGNOSIS — I83019 Varicose veins of right lower extremity with ulcer of unspecified site: Secondary | ICD-10-CM

## 2024-08-21 DIAGNOSIS — M300 Polyarteritis nodosa: Secondary | ICD-10-CM | POA: Diagnosis not present

## 2024-08-21 DIAGNOSIS — L97929 Non-pressure chronic ulcer of unspecified part of left lower leg with unspecified severity: Secondary | ICD-10-CM

## 2024-08-21 DIAGNOSIS — L97919 Non-pressure chronic ulcer of unspecified part of right lower leg with unspecified severity: Secondary | ICD-10-CM

## 2024-08-21 LAB — BASIC METABOLIC PANEL WITH GFR
Anion gap: 10 (ref 5–15)
BUN: 13 mg/dL (ref 6–20)
CO2: 24 mmol/L (ref 22–32)
Calcium: 9.3 mg/dL (ref 8.9–10.3)
Chloride: 105 mmol/L (ref 98–111)
Creatinine, Ser: 0.69 mg/dL (ref 0.44–1.00)
GFR, Estimated: >60 mL/min (ref >60)
Glucose, Bld: 90 mg/dL (ref 70–99)
Potassium: 3.8 mmol/L (ref 3.5–5.1)
Sodium: 139 mmol/L (ref 135–145)

## 2024-08-21 LAB — CBC
HCT: 39.9 % (ref 36.0–46.0)
Hemoglobin: 12.7 g/dL (ref 12.0–15.0)
MCH: 28.3 pg (ref 26.0–34.0)
MCHC: 31.8 g/dL (ref 30.0–36.0)
MCV: 89.1 fL (ref 80.0–100.0)
Platelets: 255 K/uL (ref 150–400)
RBC: 4.48 MIL/uL (ref 3.87–5.11)
RDW: 14.9 % (ref 11.5–15.5)
WBC: 10.9 K/uL — ABNORMAL HIGH (ref 4.0–10.5)
nRBC: 0 % (ref 0.0–0.2)

## 2024-08-21 MED ORDER — PREDNISONE 20 MG PO TABS: 40.0000 mg | ORAL_TABLET | Freq: Every day | ORAL | 0 refills | Status: AC

## 2024-08-21 NOTE — Discharge Instructions (Signed)
 Food Resources  Agency Name: [facility] Agency Address: [address] [place], [place] [address] Phone: [phone] Website: [email or web address] Service(s) Offered: Housing services, self-sufficiency, congregate meal program, weatherization program, event organiser program, emergency food assistance,  housing counseling, home ownership program, wheels - to work program.  [employer] free for 60 and older at various locations from usaa, Monday-Friday:  [employer], [address] [place], [phone] -[facility], [address], [place] [phone]  -[facility], [address] [place] [phone]  -[address], [address], [place], [phone]  Agency Name: [facility] Address: [phone] [address], [place], [place] [address] Phone: [phone] Website: www.[email or web address] Service(s) Offered: Home delivered hot, frozen, and emergency  meals. Grocery assistance program which matches  volunteers one-on-one with seniors unable to grocery shop  for themselves. Must be 60 years and older; less than 20  hours of in-home aide service, limited or no driving ability;  live alone or with someone with a disability; live in  [place].  Agency Name: [facility]) Address: [address] [place], [place] [address] Phone: [phone] Service(s) Offered: Food is served to shut-ins, homeless, elderly, and low income people in the community every Saturday (11:30 am-12:30 pm) and Sunday (12:30 pm-1:30pm). Volunteers also offer help and encouragement in seeking employment,  and spiritual guidance.  Agency Name: Department of Social Services Address: [address] [place], [place] [address] Phone: [phone] Service(s) Offered: Child support services; child welfare services; food stamps; Medicaid; work first family assistance; and aid  with fuel,  rent, food and medicine.  Agency Name: [employer]itian Address: [address] [place], [place] Phone: [phone] Website: [email or web address] Services Offered: Monday 10:00am-12:00, 8:00pm-9:00pm, and Friday 10:00am-12:00.  Agency Name: [employer] [facility] [place] Address: [address] [place], [place] [address] Phone: [phone] Website: [email or web address] Service(s) Offered: Serves weekday meals, open from 11:30 am- 1:00 pm., and 6:30-7:30pm, Monday-Wednesday-Friday distributes food 3:30-6pm, Monday-Wednesday-Friday.  Agency Name: [facility] Address: [address] [place], [place] Phone: [phone] Website: [email or web address] Services Offered: Distributes food the 4th Saturday of the month, starting at 8:00 am  Agency Name: [facility] Address: [phone] [address] [place], [place] [address] Phone: [phone] Website: [email or web address] Service(s) Offered: Bread of life, weekly food pantry. Open Wednesdays from 10:00am-noon.  Agency Name: [facility] [employer] Address: [address] [place], [place], [place] Phone: [phone] Services Offered: Distributes food 9am-1pm, Monday-Thursday. Call for details.  Agency Name: [facility] Address: [address] [place], [place] [address] Phone: [phone] Website: [email or web address] Service(s) Offered: Games Developer. Call for assistance.  Agency Name: [facility] Address: [address] [place], [place] [address] Phone: [phone] Service Offered: Emergency Food Pantry. Call for appointment.  Agency Name: [facility] Address: [address] [place], [place] [address] Phone: [phone] Website: [email or web address] Services Offered: Games Developer. Call for details  Agency Name: [facility] Address: [address] [place], [place] Phone:  [phone] Website: [email or web address] Service(s) Offered: Emergency Food Pantry. Call for details.  Agency Name: Holiday Representative Address: [address] [place], [place] [address] Phone: [phone] or [phone] Website: [email or web address] Service(s) Offered: Distribute food 9am-11:30 am, Tuesday-Friday, and 1-3:30pm, Monday-Friday. Food pantry Monday-Friday 1pm-3pm, fresh items, Mon.-Wed.-Fri.  Agency Name: [facility] Address: [address] [place], [place] [address] Phone: [phone] Website: [email or web address] Services Offered: Distribute food Tues and Sats from 9:00am-noon.  Closed 1st Saturday of each month. Call for details  Agency Name: [employer] Address: Fayrene Boatman [facility] [address], [place] [address] Phone: [phone]  Services Offered: Delivers meals every Thursday   Rent/Utility/Housing  Agency Name: [facility] Agency Address: [address] [place], [place] [address] Phone: [phone] Email: [email or web address] Website: [email or web address] Service(s) Offered: Housing services, self-sufficiency, congregate meal program, weatherization program, field seismologist program, emergency food assistance,  housing counseling, home ownership program, wheels -towork program.  Agency Name: [facility] Address: [address], [place], [place] [address] Phone: [phone] (8a-4p) [phone] (8p- 10p) Email: [email or web address] Website: [email or web address] Service(s) Offered: A program for homeless and/or needy men that includes one-on-one counseling, life skills training and job rehabilitation.  Agency Name: [employer] [facility] [place] Address: [address] [place], [place] [address] Phone: [phone] Website: www.[email or web address] Service(s) Offered: Assistance to needy in emergency with  utility bills, heating fuel, and prescriptions. Shelter for homeless 7pm-7am. January 27, 2017 15  Agency Name: [facility] [place] (Developmentally Disabled) Address: [address], [place], [place] [address] Phone: [phone] Contact Person: Lemond Cart Email: [email or web address] Website: [email or web address] Service(s) Offered: Helps individuals with developmental disabilities move from housing that is more restrictive to homes where they  can achieve greater independence and have more  opportunities.  Agency Name: [facility] Address: [address], [place], [place] [address] Phone: [phone] Email: [email or web address] Website: [email or web address].org Service(s) Offered: Provides affordable housing for low-income families, elderly, and disabled individuals. Offer a wide range of  programs and services, from financial planning to afterschool and summer programs.  Agency Name: Department of Social Services Address: [address] [place], [place] [address] Phone: [phone] Service(s) Offered: Child support services; child welfare services; food stamps; Medicaid; work first family assistance; and aid with fuel,  rent, food and medicine.  Agency Name: [facility] [place], [employer]. Address: [facility] [address], [place], [place]  [address] Phone: [phone] Website: www.familyabuseservices.org Service(s) Offered: 24 hour Crisis Line: 4452523443; 24 hour Emergency Shelter; Transitional Housing; Support Groups; Scientist, Physiological; [employer]; Hispanic Outreach: [phone];  Visitation Center: [phone].  Agency Name: [facility] [place]. Address: [address] [place], [place] [address] Phone: [phone] Service(s) Offered: CAP Services; Home and [employer]; Individual or Group Supports; Respite Care Non-Institutional Nursing;  Residential Supports; Respite Care and Personal Care  Services; Transportation; Family and Friends Night; Recreational Activities; Three Nutritious Meals/Snacks; Consultation with Registered Dietician; Twenty-four hour Registered Nurse Access; Daily and [employer]; Camp Green Leaves; [place] for the [employer] (During Summer Months) Bingo Night (Every  Wednesday Night); Special Populations Dance Night  (Every Tuesday Night); Professional Hair Care Services.  Agency Name: [facility] Address: [address], [place], [place] [address] Phone: [phone] Contact Person: Meade High Website: [email or web address].Physicist, Medical) Offered: Residential treatment facility for women; food and  clothing, educational & employment development and  transportation to work; counsellor of financial skills;  parenting and family reunification; emotional and spiritual  support; transitional housing for program graduates.  Agency Name: [facility] Address: [address] [place], [place] [address] Phone: [phone] Email: [email or web address] Website: [email or web address] Service(s) Offered: Public housing units for elderly, disabled, and low income people; housing choice vouchers for income eligible  applicants; shelter plus care vouchers; and Psychologist, Clinical.  Agency Name: [facility] [employer] Address: [address] [place], [place] [address] Phone: [phone] Email: [email or web address] Website: www.habitatalamance.org  Service(s) Offered: Build houses for families in need of decent housing. Each adult in the family must invest 200 hours of labor on  someone else's house, work with volunteers to build their own house, attend classes on budgeting, home maintenance, yard care, and attend homeowner association meetings.  Agency Name: [facility] Address: [address], [place], [place] [address] Phone: [phone] Website:  www.rsli.org Service(s) Offered: Intermediate care facilities for intellectually delayed, Supervised Living in group homes for adults with developmental disabilities, Supervised Living for people who have dual diagnoses (MRMI), Independent Living, Supported Living, respite and a variety of CAP services, pre-vocational services, day supports, and [employer].  Agency Name: [place] [employer] [facility] Phone: [phone] Website: www.NCForeclosurePrevention.gov Service(s) Offered: Zero-interest, deferred loans to homeowners struggling to pay their mortgage. Call for more information.     Transportation Resources for [employer]  Agency Name: [facility] Agency Address: [address] [place], [place] [address] Phone: [phone] Email: [email or web address] Website: www.[email or web address].org Service(s) Offered: Housing services, self-sufficiency, congregate meal program, weatherization program, field seismologist program, emergency food assistance,  housing counseling, home ownership program, wheels-towork program.  Agency Name: [facility] [employer] [phone]) Address: [address], [place], [place] [address] Phone: [phone] Website: www.acta-[email or web address] Service(s) Offered: Transportation for bluelinx, subscription and demand response; Dial-a-Ride for citizens 34 years of age or older.  Agency Name: Department of Social Services Address: [address] [place], [place] [address] Phone: [phone] Service(s) Offered: Child support services; child welfare services; food stamps; Medicaid; work first family assistance; and aid with fuel,  rent, food and medicine, transportation assistance.  Agency Name: Disabled [employer] (DAV) Transportation  Network Phone: [phone] Service(s) Offered: Transports veterans to the [facility] medical center. Call  forty-eight hours in  advance and leave the name, telephone  number, date, and time of appointment. Veteran will be  contacted by the driver the day before the appointment to  arrange a pick up point    [employer] ACTA currently provides door to door services. ACTA connects with PART daily for services to [facility]. ACTA also performs contract services to [employer] operates 27 vehicles, all but 3 mini-vans are equipped with lifts for special needs as well as the general public. ACTA drivers are each CDL certified and trained in First Aid and CPR. ACTA was established in 2002 by [employer]. An independent Industrial/product Designer. ACTA operates via cytogeneticist with required local 10% match funding from [place]. ACTA provides over 80,000 passenger trips each year, including [facility] and [employer] sites.  Call at least by 11 AM one business day prior to needing transportation  [employer].                      [place], [place] [address]     Office Hours: Monday-Friday  8 AM - 5 PM   Do you feel isolated?  The Institute on Aging offers a [employer] that anyone can call toll free at [phone]. The friendship line is available 24 hours a day  [employer] is a Program of All-inclusive Care for the Elderly (PACE). Their mission is to promote and sustain the independence of seniors wishing to remain in the community. They provide seniors with comprehensive long-term health, social, medical and dietary care. Their program is a safe alternative to nursing home  care. [phone]  [facility] Physical Address [facility] [address] [place], [place] [address] Phone: [phone]. . Online zoom yoga class, connect with others without leaving your home [facility] offers [place] dance cardio sessions for individuals via  Zoom. This program provides: - Dance fitness activities Please contact program for more information. Servinganyone in need adults 18+ hiv/aids individuals families Call [phone]  Email [email or web address] [email or web address] to get more info  Humana offers an online [employer] to individuals where they can receive help to focus on their best health. Whether you're a Humana member or not, the neighborhood center offers a... Main Serviceshealth education  exercise & fitness  community support services  recreation  virtual support Other Servicessupport groups Servinganyone in need adults young adults teens seniors individuals families [email or web address] to get more info  Schedule on their website  The [facility] offers an array of activities for adults age 41 and over. This program provides:- Fitness and health programs- Tech classes- Activity books Main Serviceshealth education  community support services  exercise & fitness  recreation  more education Servingseniors  Call [phone]    For more resources go online to Rhodeislandbargains.co.uk and type in you zipcode

## 2024-08-21 NOTE — Progress Notes (Signed)
 Discharge instructions given to patient, questions answered. IV removed without complications. Patient transporting home via car by mother.

## 2024-08-21 NOTE — TOC CM/SW Note (Addendum)
 Transition of Care [facility]) - Inpatient Brief Assessment   Patient Details  Name: Milinda Sweeney MRN: [id number] Date of Birth: 1976-09-03  Transition of [facility]) CM/SW Contact:    Daved JONETTA Hamilton, RN Phone Number: 08/21/2024, 10:06 AM   Clinical Narrative:   Transition of Care [facility]) Screening Note   Patient Details  Name: Jaedah Lords Date of Birth: 20-Feb-1976   Transition of Care [facility]) CM/SW Contact:    Daved JONETTA Hamilton, RN Phone Number: 08/21/2024, 10:06 AM  Resources added to AVS. Patient verbally declined BSC.  Transition of Care Department [facility]) has reviewed patient and no TOC needs have been identified at this time. If new patient transition needs arise, please place a TOC consult.    Transition of Care Asessment: Insurance and Status: Insurance coverage has been reviewed Patient has primary care physician: Yes ([facility]rig[facility])   Prior level of function:: Independent Prior/Current Home Services: No current home services Social Drivers of Health Review: SDOH reviewed interventions complete (Resources added to AVS for food, housing, transportation, social) Readmission risk has been reviewed: Yes Transition of care needs: no transition of care needs at this time

## 2024-08-21 NOTE — Evaluation (Signed)
 Occupational Therapy Evaluation Patient Details Name: Regina Carpenter MRN: [id number] DOB: 1976-01-27 Today's Date: 08/21/2024   History of Present Illness   Danette Weinfeld is a 48 y.o. 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     Clinical Impressions Pt was seen for OT evaluation this date. Prior to hospital admission, pt was independent and working, however limited somewhat by bilat LE/feet pain. Pt received ambulating independently after toileting and grooming in bathroom. Pt currently requires no direct assist for ADL and mobility. Tolerance and pain are biggest limiting factors. Pt educated in activity pacing, work simplification, strategies to support safety and minimize BLE pain with prolonged standing at home and at work. Pt verbalized understanding. No additional concerns/questions. Will sign off.      Functional Status Assessment   Patient has had a recent decline in their functional status and demonstrates the ability to make significant improvements in function in a reasonable and predictable amount of time.     Equipment Recommendations   Tub/shower seat     Recommendations for Other Services         Precautions/Restrictions   Precautions Precautions: None Restrictions Weight Bearing Restrictions Per Provider Order: No     Mobility Bed Mobility Overal bed mobility: Modified Independent       Transfers Overall transfer level: Modified independent       Balance Overall balance assessment: Needs assistance, Modified Independent Sitting-balance support: Single extremity supported Sitting balance-Leahy Scale: Normal     Standing balance  support: During functional activity Standing balance-Leahy Scale: Good          ADL either performed or assessed with clinical judgement   ADL Overall ADL's : Modified independent           Pertinent Vitals/Pain Pain Assessment Pain Assessment: Faces Faces Pain Scale: Hurts little more Pain Location: BLE Pain Descriptors / Indicators: Aching Pain Intervention(s): Monitored during session, Premedicated before session, Repositioned     Extremity/Trunk Assessment Upper Extremity Assessment Upper Extremity Assessment: Overall WFL for tasks assessed   Lower Extremity Assessment Lower Extremity Assessment: Overall WFL for tasks assessed;Generalized weakness   Cervical / Trunk Assessment Cervical / Trunk Assessment: Kyphotic   Communication Communication Communication: No apparent difficulties   Cognition Arousal: Alert Behavior During Therapy: WFL for tasks assessed/performed Cognition: No apparent impairments      Following commands: Intact       Cueing  General Comments   Cueing Techniques: Verbal cues      Exercises Other Exercises Other Exercises: Pt educated in activity pacing, work simplification, strategies to support safety and minimize BLE pain with prolonged standing at home and at work.   Shoulder Instructions      Home Living Family/patient expects to be discharged to:: Private residence Living Arrangements: Non-relatives/Friends Available Help at Discharge: Family;Available PRN/intermittently Type of Home: House Home Access: Level entry;Stairs to enter Entrance Stairs-Number of Steps: 2 Entrance Stairs-Rails: Can reach both Home Layout: Two level     Bathroom Shower/Tub: Chief Strategy Officer: Standard     Home Equipment: None          Prior Functioning/Environment Prior Level of Function : Independent/Modified Independent;Working/employed;Driving     OT Problem List: Pain;Decreased activity tolerance  OT  Goals(Current goals can be found in the care plan section)   Acute Rehab OT Goals Patient Stated Goal: have less pain OT Goal Formulation: All assessment and education complete, DC therapy   AM-PAC OT 6 Clicks Daily Activity     Outcome Measure Help from another person eating meals?: None Help from another person taking care of personal grooming?: None Help from another person toileting, which includes using toliet, bedpan, or urinal?: None Help from another person bathing (including washing, rinsing, drying)?: None Help from another person to put on and taking off regular upper body clothing?: None Help from another person to put on and taking off regular lower body clothing?: None 6 Click Score: 24   End of Session    Activity Tolerance: Patient tolerated treatment well Patient left: in bed;with call bell/phone within reach  OT Visit Diagnosis: Pain Pain - Right/Left: Left (both) Pain - part of body: Leg;Ankle and joints of foot                Time: 1041-1050 OT Time Calculation (min): 9 min Charges:  OT General Charges $OT Visit: 1 Visit OT Evaluation $OT Eval Low Complexity: 1 Low  Warren SAUNDERS., MPH, MS, OTR/L ascom [phone] 08/21/24, 11:19 AM

## 2024-08-21 NOTE — Discharge Summary (Signed)
 Physician Discharge Summary   Patient: Regina Carpenter MRN: [id number] DOB: 10-Feb-1976  Admit date:     08/19/2024  Discharge date: 08/21/24  Discharge Physician: Amaryllis Dare   PCP: [facility]   Recommendations at discharge:  Please obtain CBC and BMP on follow-up Follow-up in the wound care clinic-referral was given Follow-up with her rheumatologist Follow-up with primary care provider  Discharge Diagnoses: Principal Problem:   Sepsis due to cellulitis [facility]) Active Problems:   Polyarteritis nodosa (HCC)   History of pulmonary embolism   Continuous dependence on cigarette smoking   Bilateral lower leg cellulitis   Cellulitis   Ulcer of ankle [facility])   Hospital Course: Partly taken from H&P.  Regina Carpenter is a 48 y.o. 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   She presented to ED about a week ago with similar symptoms and was given doxycycline , she took doxycycline  for 6 days with no improvement.  On presentation she was mildly tachycardic, otherwise stable vitals.  Labs with WBC 11.7, rest stable.  X-ray of right ankle was negative for any acute fracture or osteomyelitis.  Per chart review patient has a bilateral lower extremity ulcers as early as 2021 underwent extensive workup including arteriogram, eventually underwent right foot transmetatarsal imitation. The wound is strongly associated with PAN, for which treatment followed by rheumatology at [facility] and the most recent visit was less than 1 month ago. Recommend that patient go back to outpatient rheumatology to continue follow-up and set up [facility] wound care center care as soon as possible.   She was started  on cefepime .  Wound care was consulted.  11/17: Vital stable, MRSA PCR negative, Preliminary blood cultures negative, no definitive sign of ongoing infection, likely some exacerbation of vasculitis.  PT and OT with no F/U recommendations.  11/18: Remained hemodynamically stable, leukocytosis improving.  Patient received cefepime , less likely an infectious cause but we decided to complete the course of Augmentin  and doxycycline  which was already given to her and she has some pills left.  We increase the dose of prednisone  to 40 mg for the next 5 days and then she will resume her home dose.  She was given a referral for local wound care clinic at her request.  She was also instructed to have a close follow-up with her rheumatologist for further assistance.  She will continue with her home medications and follow-up with her providers for further management.  Assessment and Plan: * Sepsis due to cellulitis [facility]) Patient met sepsis criteria with tachycardia and leukocytosis, likely due to bilateral lower extremity cellulitis with chronic wounds.  No obvious discharge. Wound care was consulted. Patient did receive IV fluid in ED. - Patient received cefepime  while in the hospital and she will complete the course of Augmentin  and doxycycline  which was recently given at home. 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  S/p left 2nd and 5th toes amputation and right-sided TMA.  According to recent office note from [facility] rheumatology, her medication DMARDs, were adjusted, patient was started on cyclophosphamide  and dapsone, continued on hydroxychloroquine , and prednisone  40 mg daily. Weekly methotrexate  is on hold while on cyclophosphamide .   -  Continue home medications - Needed close follow-up with her rheumatologist for further assistance  History of pulmonary  embolism - Continue home Eliquis   Continuous dependence on cigarette smoking Counseling was provided. -Nicotine  patch as needed  Consultants: None Procedures performed: None Disposition: Home Diet recommendation:  Discharge Diet Orders (From admission, onward)     Start     Ordered   08/21/24 0000  Diet - low sodium heart healthy        08/21/24 1037           Regular diet DISCHARGE MEDICATION: Allergies as of 08/21/2024       Reactions   Ibuprofen Other (See Comments)   Pt states that she sweats profusely after taking ibuprofen Other reaction(s): Other (See Comments) sweating:   Sulfa  Antibiotics Rash        Medication List     STOP taking these medications    methotrexate  2.5 MG tablet Commonly known as: RHEUMATREX   oxyCODONE  5 MG immediate release tablet Commonly known as: Oxy IR/ROXICODONE        TAKE these medications    acetaminophen  500 MG tablet Commonly known as: TYLENOL  Take 1,000 mg by mouth daily.   amoxicillin -clavulanate 875-125 MG tablet Commonly known as: AUGMENTIN  Take 1 tablet by mouth 2 (two) times daily.   colchicine  0.6 MG tablet Take 0.6 mg by mouth daily.   cyclophosphamide  50 MG capsule Commonly known as: CYTOXAN  Take 100 mg by mouth 2 (two) times daily.   dapsone 100 MG tablet Take 100 mg by mouth daily.   doxycycline  100 MG tablet Commonly known as: VIBRA -TABS Take 1 tablet (100 mg total) by mouth 2 (two) times daily.   Eliquis  5 MG Tabs tablet Generic drug: apixaban  Take 5 mg by mouth 2 (two) times daily.   gabapentin  300 MG capsule Commonly known as: NEURONTIN  Take 300 mg by mouth 2 (two) times daily.   HYDROcodone -acetaminophen  5-325 MG tablet Commonly known as: NORCO/VICODIN Take 1 tablet by mouth every 6 (six) hours as needed for moderate pain (pain score 4-6).   hydroxychloroquine  200 MG tablet Commonly known as: PLAQUENIL  Take 200 mg by mouth 2 (two) times daily.   predniSONE  10 MG  tablet Commonly known as: DELTASONE  Take 1 tablet (10 mg total) by mouth daily with breakfast. What changed: Another medication with the same name was added. Make sure you understand how and when to take each.   predniSONE  20 MG tablet Commonly known as: DELTASONE  Take 2 tablets (40 mg total) by mouth daily with breakfast for 5 days. Start taking on: August 22, 2024 What changed: You were already taking a medication with the same name, and this prescription was added. Make sure you understand how and when to take each.   venlafaxine  XR 75 MG 24 hr capsule Commonly known as: EFFEXOR -XR Take 75 mg by mouth daily.               Discharge Care Instructions  (From admission, onward)           Start     Ordered   08/21/24 0000  Discharge wound care:       Comments: Cleanse B lower leg/foot/ankle wounds with Vashe, do not rinse and allow to air dry.  Apply Xeroform gauze ([place] [phone]) to wound beds daily and secure with silicone foam or Kerlix roll gauze whichever patient can tolerate/permit.   08/21/24 1037            Follow-up Information  [facility] Schedule an appointment as soon as possible for a visit in 1 week(s).   Contact information: [address] [place] [address] [phone]                Discharge Exam: Regina Carpenter   08/19/24 1156  Weight: 117.5 kg   General.  Morbidly obese lady, in no acute distress. Pulmonary.  Lungs clear bilaterally, normal respiratory effort. CV.  Regular rate and rhythm, no JVD, rub or murmur. Abdomen.  Soft, nontender, nondistended, BS positive. CNS.  Alert and oriented .  No focal neurologic deficit. Extremities.  Left TMA, missing couple of toes on right, bandages on both legs. Psychiatry.  Judgment and insight appears normal.   Condition at discharge: stable  The results of significant diagnostics from this hospitalization (including imaging, microbiology, ancillary and laboratory)  are listed below for reference.   Imaging Studies: DG Ankle Complete Right Result Date: 08/19/2024 CLINICAL DATA:  Possible soft tissue infection right lower leg. EXAM: RIGHT ANKLE - COMPLETE 3+ VIEW COMPARISON:  Right ankle 05/21/2024 FINDINGS: Evidence of patient's transmetatarsal amputations unchanged. Ankle mortise is normal. No acute fracture or dislocation. No bone destruction to suggest osteomyelitis. Suggestion of patient's soft tissue ulcerations over the mid to distal lower leg as well as at the level of the ankle laterally. IMPRESSION: 1. No acute fracture or dislocation. No evidence of osteomyelitis. 2. Soft tissue changes over the mid to distal lower leg and ankle laterally likely corresponding to patient's reported ulcerations. Electronically Signed   By: Toribio Agreste M.D.   On: 08/19/2024 15:13    Microbiology: Results for orders placed or performed during the hospital encounter of 08/19/24  Blood culture (single)     Status: None (Preliminary result)   Collection Time: 08/19/24 12:02 PM   Specimen: BLOOD  Result Value Ref Range Status   Specimen Description BLOOD RIGHT ANTECUBITAL  Final   Special Requests   Final    BOTTLES DRAWN AEROBIC AND ANAEROBIC Blood Culture adequate volume   Culture   Final    NO GROWTH 2 DAYS Performed at [facility], [address], [place], [place] [address]    Report Status PENDING  Incomplete  Blood culture (single)     Status: None (Preliminary result)   Collection Time: 08/19/24  1:51 PM   Specimen: BLOOD  Result Value Ref Range Status   Specimen Description BLOOD LEFT ANTECUBITAL  Final   Special Requests   Final    BOTTLES DRAWN AEROBIC AND ANAEROBIC Blood Culture adequate volume   Culture   Final    NO GROWTH 2 DAYS Performed at [facility] [address] [place], [place] [address]    Report Status PENDING  Incomplete  MRSA Next Gen by PCR, Nasal     Status: None   Collection Time: 08/19/24  1:51 PM    Specimen: Nasal Mucosa; Nasal Swab  Result Value Ref Range Status   MRSA by PCR Next Gen NOT DETECTED NOT DETECTED Final    Comment: (NOTE) The GeneXpert MRSA Assay (FDA approved for NASAL specimens only), is one component of a comprehensive MRSA colonization surveillance program. It is not intended to diagnose MRSA infection nor to guide or monitor treatment for MRSA infections. Test performance is not FDA approved in patients less than 76 years old. Performed at [facility], [address] [place], [place] [address]     Labs: CBC: Recent Labs  Lab 08/19/24 [phone] 08/21/24 [phone]  WBC 11.7* 10.9*  NEUTROABS 6.8  --   HGB 13.9 12.7  HCT 41.8 39.9  MCV 90.3 89.1  PLT 274 255   Basic Metabolic Panel: Recent Labs  Lab 08/19/24 1202 08/21/24 0529  NA 137 139  K 4.0 3.8  CL 104 105  CO2 20* 24  GLUCOSE 167* 90  BUN 11 13  CREATININE 0.85 0.69  CALCIUM 9.4 9.3   Liver Function Tests: Recent Labs  Lab 08/19/24 1202  AST 27  ALT 13  ALKPHOS 77  BILITOT 0.7  PROT 7.3  ALBUMIN 3.8   CBG: No results for input(s): GLUCAP in the last 168 hours.  Discharge time spent: greater than 30 minutes.  This record has been created using Conservation officer, historic buildings. Errors have been sought and corrected,but may not always be located. Such creation errors do not reflect on the standard of care.   Signed: Amaryllis Dare, MD [facility] 08/21/2024

## 2024-08-24 LAB — CULTURE, BLOOD (SINGLE)
Culture: NO GROWTH
Culture: NO GROWTH
Special Requests: ADEQUATE
Special Requests: ADEQUATE

## 2024-10-02 ENCOUNTER — Encounter: Admitting: Physician Assistant
